# Patient Record
Sex: Male | Born: 1950
Health system: Southern US, Community
[De-identification: ages and names within clinical notes are randomized; demographics above are authoritative.]

## PROBLEM LIST (undated history)

## (undated) DIAGNOSIS — I4891 Unspecified atrial fibrillation: Secondary | ICD-10-CM

## (undated) DIAGNOSIS — J309 Allergic rhinitis, unspecified: Secondary | ICD-10-CM

## (undated) DIAGNOSIS — I509 Heart failure, unspecified: Secondary | ICD-10-CM

## (undated) DIAGNOSIS — I10 Essential (primary) hypertension: Secondary | ICD-10-CM

## (undated) DIAGNOSIS — I639 Cerebral infarction, unspecified: Secondary | ICD-10-CM

## (undated) DIAGNOSIS — M1990 Unspecified osteoarthritis, unspecified site: Secondary | ICD-10-CM

## (undated) DIAGNOSIS — I5022 Chronic systolic (congestive) heart failure: Secondary | ICD-10-CM

## (undated) DIAGNOSIS — T7840XA Allergy, unspecified, initial encounter: Secondary | ICD-10-CM

## (undated) DIAGNOSIS — Z720 Tobacco use: Secondary | ICD-10-CM

## (undated) DIAGNOSIS — J449 Chronic obstructive pulmonary disease, unspecified: Secondary | ICD-10-CM

## (undated) DIAGNOSIS — T63311A Toxic effect of venom of black widow spider, accidental (unintentional), initial encounter: Secondary | ICD-10-CM

## (undated) HISTORY — PX: APPENDECTOMY: SHX54

## (undated) HISTORY — DX: Allergy, unspecified, initial encounter: T78.40XA

## (undated) HISTORY — DX: Toxic effect of venom of black widow spider, accidental (unintentional), initial encounter: T63.311A

## (undated) HISTORY — DX: Allergic rhinitis, unspecified: J30.9

## (undated) HISTORY — DX: Cerebral infarction, unspecified: I63.9

## (undated) HISTORY — PX: TONSILLECTOMY: SUR1361

## (undated) HISTORY — PX: SMALL INTESTINE SURGERY: SHX150

## (undated) HISTORY — PX: VASECTOMY: SHX75

---

## 1999-07-18 ENCOUNTER — Encounter: Admission: RE | Admit: 1999-07-18 | Discharge: 1999-08-04 | Payer: Self-pay | Admitting: *Deleted

## 2010-09-12 ENCOUNTER — Other Ambulatory Visit: Payer: Self-pay | Admitting: *Deleted

## 2011-06-13 ENCOUNTER — Other Ambulatory Visit: Payer: Self-pay | Admitting: Family Medicine

## 2011-06-13 NOTE — Telephone Encounter (Signed)
.  [facility] PT REQUESTING A REFILL ON VIAGRA, WAS TOLD THE PHARMACY TOLD HIM TO CALL us  PLEASE CALL PT AT [phone]  [facility] ON [address]

## 2015-09-15 ENCOUNTER — Inpatient Hospital Stay (HOSPITAL_COMMUNITY)
Admission: EM | Admit: 2015-09-15 | Discharge: 2015-09-28 | DRG: 291 | Disposition: A | Discharge status: Home / Self Care | Payer: Self-pay | Attending: Internal Medicine | Admitting: Internal Medicine

## 2015-09-15 ENCOUNTER — Emergency Department (HOSPITAL_COMMUNITY): Payer: Self-pay

## 2015-09-15 ENCOUNTER — Ambulatory Visit (INDEPENDENT_AMBULATORY_CARE_PROVIDER_SITE_OTHER): Payer: Self-pay | Admitting: Emergency Medicine

## 2015-09-15 ENCOUNTER — Inpatient Hospital Stay (HOSPITAL_COMMUNITY): Payer: Self-pay

## 2015-09-15 ENCOUNTER — Encounter (HOSPITAL_COMMUNITY): Payer: Self-pay | Admitting: Emergency Medicine

## 2015-09-15 VITALS — BP 114/82 | HR 132 | Temp 98.6°F | Resp 20

## 2015-09-15 DIAGNOSIS — R05 Cough: Secondary | ICD-10-CM

## 2015-09-15 DIAGNOSIS — E876 Hypokalemia: Secondary | ICD-10-CM | POA: Diagnosis present

## 2015-09-15 DIAGNOSIS — R0600 Dyspnea, unspecified: Secondary | ICD-10-CM

## 2015-09-15 DIAGNOSIS — R7303 Prediabetes: Secondary | ICD-10-CM | POA: Diagnosis present

## 2015-09-15 DIAGNOSIS — K661 Hemoperitoneum: Secondary | ICD-10-CM | POA: Diagnosis not present

## 2015-09-15 DIAGNOSIS — I110 Hypertensive heart disease with heart failure: Principal | ICD-10-CM | POA: Diagnosis present

## 2015-09-15 DIAGNOSIS — N179 Acute kidney failure, unspecified: Secondary | ICD-10-CM | POA: Diagnosis not present

## 2015-09-15 DIAGNOSIS — I4891 Unspecified atrial fibrillation: Secondary | ICD-10-CM | POA: Diagnosis present

## 2015-09-15 DIAGNOSIS — I480 Paroxysmal atrial fibrillation: Secondary | ICD-10-CM

## 2015-09-15 DIAGNOSIS — Z720 Tobacco use: Secondary | ICD-10-CM | POA: Diagnosis present

## 2015-09-15 DIAGNOSIS — R0602 Shortness of breath: Secondary | ICD-10-CM

## 2015-09-15 DIAGNOSIS — D649 Anemia, unspecified: Secondary | ICD-10-CM | POA: Diagnosis present

## 2015-09-15 DIAGNOSIS — R109 Unspecified abdominal pain: Secondary | ICD-10-CM

## 2015-09-15 DIAGNOSIS — R578 Other shock: Secondary | ICD-10-CM | POA: Insufficient documentation

## 2015-09-15 DIAGNOSIS — F17200 Nicotine dependence, unspecified, uncomplicated: Secondary | ICD-10-CM | POA: Diagnosis present

## 2015-09-15 DIAGNOSIS — R0989 Other specified symptoms and signs involving the circulatory and respiratory systems: Secondary | ICD-10-CM

## 2015-09-15 DIAGNOSIS — I472 Ventricular tachycardia: Secondary | ICD-10-CM | POA: Diagnosis not present

## 2015-09-15 DIAGNOSIS — I959 Hypotension, unspecified: Secondary | ICD-10-CM

## 2015-09-15 DIAGNOSIS — R570 Cardiogenic shock: Secondary | ICD-10-CM | POA: Insufficient documentation

## 2015-09-15 DIAGNOSIS — I517 Cardiomegaly: Secondary | ICD-10-CM

## 2015-09-15 DIAGNOSIS — E873 Alkalosis: Secondary | ICD-10-CM | POA: Diagnosis present

## 2015-09-15 DIAGNOSIS — D62 Acute posthemorrhagic anemia: Secondary | ICD-10-CM | POA: Diagnosis not present

## 2015-09-15 DIAGNOSIS — Z888 Allergy status to other drugs, medicaments and biological substances status: Secondary | ICD-10-CM

## 2015-09-15 DIAGNOSIS — E222 Syndrome of inappropriate secretion of antidiuretic hormone: Secondary | ICD-10-CM | POA: Diagnosis present

## 2015-09-15 DIAGNOSIS — J40 Bronchitis, not specified as acute or chronic: Secondary | ICD-10-CM

## 2015-09-15 DIAGNOSIS — Z885 Allergy status to narcotic agent status: Secondary | ICD-10-CM

## 2015-09-15 DIAGNOSIS — T148XXA Other injury of unspecified body region, initial encounter: Secondary | ICD-10-CM

## 2015-09-15 DIAGNOSIS — M5430 Sciatica, unspecified side: Secondary | ICD-10-CM | POA: Diagnosis present

## 2015-09-15 DIAGNOSIS — F17210 Nicotine dependence, cigarettes, uncomplicated: Secondary | ICD-10-CM | POA: Diagnosis present

## 2015-09-15 DIAGNOSIS — R7401 Elevation of levels of liver transaminase levels: Secondary | ICD-10-CM | POA: Diagnosis present

## 2015-09-15 DIAGNOSIS — I5041 Acute combined systolic (congestive) and diastolic (congestive) heart failure: Secondary | ICD-10-CM | POA: Diagnosis present

## 2015-09-15 DIAGNOSIS — K7200 Acute and subacute hepatic failure without coma: Secondary | ICD-10-CM | POA: Diagnosis present

## 2015-09-15 DIAGNOSIS — R740 Nonspecific elevation of levels of transaminase and lactic acid dehydrogenase [LDH]: Secondary | ICD-10-CM

## 2015-09-15 DIAGNOSIS — J9601 Acute respiratory failure with hypoxia: Secondary | ICD-10-CM | POA: Diagnosis not present

## 2015-09-15 DIAGNOSIS — R571 Hypovolemic shock: Secondary | ICD-10-CM | POA: Diagnosis not present

## 2015-09-15 DIAGNOSIS — I071 Rheumatic tricuspid insufficiency: Secondary | ICD-10-CM | POA: Diagnosis present

## 2015-09-15 DIAGNOSIS — B3324 Viral cardiomyopathy: Secondary | ICD-10-CM | POA: Diagnosis present

## 2015-09-15 DIAGNOSIS — H9193 Unspecified hearing loss, bilateral: Secondary | ICD-10-CM | POA: Diagnosis present

## 2015-09-15 DIAGNOSIS — K761 Chronic passive congestion of liver: Secondary | ICD-10-CM | POA: Diagnosis present

## 2015-09-15 DIAGNOSIS — I252 Old myocardial infarction: Secondary | ICD-10-CM

## 2015-09-15 DIAGNOSIS — H547 Unspecified visual loss: Secondary | ICD-10-CM | POA: Diagnosis not present

## 2015-09-15 DIAGNOSIS — I5021 Acute systolic (congestive) heart failure: Secondary | ICD-10-CM

## 2015-09-15 DIAGNOSIS — E8809 Other disorders of plasma-protein metabolism, not elsewhere classified: Secondary | ICD-10-CM | POA: Diagnosis present

## 2015-09-15 DIAGNOSIS — R231 Pallor: Secondary | ICD-10-CM

## 2015-09-15 DIAGNOSIS — R58 Hemorrhage, not elsewhere classified: Secondary | ICD-10-CM | POA: Insufficient documentation

## 2015-09-15 DIAGNOSIS — Z8249 Family history of ischemic heart disease and other diseases of the circulatory system: Secondary | ICD-10-CM

## 2015-09-15 DIAGNOSIS — J441 Chronic obstructive pulmonary disease with (acute) exacerbation: Secondary | ICD-10-CM | POA: Diagnosis present

## 2015-09-15 DIAGNOSIS — R059 Cough, unspecified: Secondary | ICD-10-CM

## 2015-09-15 HISTORY — DX: Unspecified osteoarthritis, unspecified site: M19.90

## 2015-09-15 HISTORY — DX: Tobacco use: Z72.0

## 2015-09-15 LAB — I-STAT ARTERIAL BLOOD GAS, ED
Acid-base deficit: 1 mmol/L (ref 0.0–2.0)
Bicarbonate: 21.4 mEq/L (ref 20.0–24.0)
O2 SAT: 96 %
PCO2 ART: 30 mmHg — AB (ref 35.0–45.0)
PH ART: 7.463 — AB (ref 7.350–7.450)
PO2 ART: 79 mmHg — AB (ref 80.0–100.0)
Patient temperature: 98.6
TCO2: 22 mmol/L (ref 0–100)

## 2015-09-15 LAB — PROTIME-INR
INR: 1.41 (ref 0.00–1.49)
Prothrombin Time: 17.4 seconds — ABNORMAL HIGH (ref 11.6–15.2)

## 2015-09-15 LAB — CBC WITH DIFFERENTIAL/PLATELET
Basophils Absolute: 0 10*3/uL (ref 0.0–0.1)
Basophils Relative: 0 %
Eosinophils Absolute: 0 10*3/uL (ref 0.0–0.7)
Eosinophils Relative: 0 %
HEMATOCRIT: 40.6 % (ref 39.0–52.0)
HEMOGLOBIN: 13.5 g/dL (ref 13.0–17.0)
LYMPHS PCT: 13 %
Lymphs Abs: 1.5 10*3/uL (ref 0.7–4.0)
MCH: 29.3 pg (ref 26.0–34.0)
MCHC: 33.3 g/dL (ref 30.0–36.0)
MCV: 88.1 fL (ref 78.0–100.0)
MONO ABS: 0.9 10*3/uL (ref 0.1–1.0)
MONOS PCT: 8 %
NEUTROS ABS: 9 10*3/uL — AB (ref 1.7–7.7)
NEUTROS PCT: 79 %
Platelets: 236 10*3/uL (ref 150–400)
RBC: 4.61 MIL/uL (ref 4.22–5.81)
RDW: 13.8 % (ref 11.5–15.5)
WBC: 11.4 10*3/uL — ABNORMAL HIGH (ref 4.0–10.5)

## 2015-09-15 LAB — COMPREHENSIVE METABOLIC PANEL
ALBUMIN: 3.6 g/dL (ref 3.5–5.0)
ALT: 122 U/L — AB (ref 17–63)
AST: 81 U/L — AB (ref 15–41)
Alkaline Phosphatase: 81 U/L (ref 38–126)
Anion gap: 10 (ref 5–15)
BUN: 20 mg/dL (ref 6–20)
CHLORIDE: 97 mmol/L — AB (ref 101–111)
CO2: 21 mmol/L — AB (ref 22–32)
CREATININE: 0.96 mg/dL (ref 0.61–1.24)
Calcium: 8.8 mg/dL — ABNORMAL LOW (ref 8.9–10.3)
GFR calc Af Amer: >60 mL/min (ref >60)
GFR calc non Af Amer: >60 mL/min (ref >60)
GLUCOSE: 144 mg/dL — AB (ref 65–99)
Potassium: 4 mmol/L (ref 3.5–5.1)
SODIUM: 128 mmol/L — AB (ref 135–145)
Total Bilirubin: 1.3 mg/dL — ABNORMAL HIGH (ref 0.3–1.2)
Total Protein: 6 g/dL — ABNORMAL LOW (ref 6.5–8.1)

## 2015-09-15 LAB — RAPID URINE DRUG SCREEN, HOSP PERFORMED
AMPHETAMINES: NOT DETECTED
BARBITURATES: NOT DETECTED
BENZODIAZEPINES: POSITIVE — AB
COCAINE: NOT DETECTED
Opiates: NOT DETECTED
TETRAHYDROCANNABINOL: POSITIVE — AB

## 2015-09-15 LAB — MRSA PCR SCREENING: MRSA by PCR: NEGATIVE

## 2015-09-15 LAB — TROPONIN I
Troponin I: 0.07 ng/mL — ABNORMAL HIGH (ref <0.031)
Troponin I: 0.08 ng/mL — ABNORMAL HIGH (ref <0.031)
Troponin I: 0.07 ng/mL — ABNORMAL HIGH (ref <0.031)

## 2015-09-15 LAB — TSH: TSH: 1.387 u[IU]/mL (ref 0.350–4.500)

## 2015-09-15 LAB — BRAIN NATRIURETIC PEPTIDE: B Natriuretic Peptide: 919.3 pg/mL — ABNORMAL HIGH (ref 0.0–100.0)

## 2015-09-15 LAB — HEPARIN LEVEL (UNFRACTIONATED): Heparin Unfractionated: 0.13 IU/mL — ABNORMAL LOW (ref 0.30–0.70)

## 2015-09-15 LAB — MAGNESIUM: MAGNESIUM: 2.1 mg/dL (ref 1.7–2.4)

## 2015-09-15 MED ORDER — FUROSEMIDE 10 MG/ML IJ SOLN
20.0000 mg | Freq: Once | INTRAMUSCULAR | Status: AC
  Administered 2015-09-15: 20 mg via INTRAVENOUS
  Filled 2015-09-15: qty 2

## 2015-09-15 MED ORDER — DEXTROSE 5 % IV SOLN: 5.0000 mg/h | Freq: Once | INTRAVENOUS | Status: AC

## 2015-09-15 MED ORDER — DILTIAZEM HCL 100 MG IV SOLR
INTRAVENOUS | Status: AC
  Filled 2015-09-15: qty 100

## 2015-09-15 MED ORDER — METHYLPREDNISOLONE SODIUM SUCC 125 MG IJ SOLR
125.0000 mg | Freq: Once | INTRAMUSCULAR | Status: AC
  Administered 2015-09-15: 125 mg via INTRAVENOUS
  Filled 2015-09-15: qty 2

## 2015-09-15 MED ORDER — IPRATROPIUM-ALBUTEROL 0.5-2.5 (3) MG/3ML IN SOLN: 3.0000 mL | Freq: Four times a day (QID) | RESPIRATORY_TRACT | Status: DC | PRN

## 2015-09-15 MED ORDER — HEPARIN (PORCINE) IN NACL 100-0.45 UNIT/ML-% IJ SOLN
1800.0000 [IU]/h | INTRAMUSCULAR | Status: DC
  Administered 2015-09-15: 1200 [IU]/h via INTRAVENOUS
  Administered 2015-09-16: 1700 [IU]/h via INTRAVENOUS
  Administered 2015-09-18: 2000 [IU]/h via INTRAVENOUS
  Administered 2015-09-18: 2100 [IU]/h via INTRAVENOUS
  Administered 2015-09-20 (×2): 2000 [IU]/h via INTRAVENOUS
  Administered 2015-09-21: 1800 [IU]/h via INTRAVENOUS
  Filled 2015-09-15 (×10): qty 250

## 2015-09-15 MED ORDER — HEPARIN BOLUS VIA INFUSION
4000.0000 [IU] | Freq: Once | INTRAVENOUS | Status: AC
  Administered 2015-09-15: 4000 [IU] via INTRAVENOUS
  Filled 2015-09-15: qty 4000

## 2015-09-15 MED ORDER — DILTIAZEM HCL 100 MG IV SOLR
5.0000 mg/h | INTRAVENOUS | Status: DC
  Administered 2015-09-15: 12.5 mg/h via INTRAVENOUS
  Administered 2015-09-16 (×2): 5 mg/h via INTRAVENOUS
  Filled 2015-09-15 (×2): qty 100

## 2015-09-15 MED ORDER — ASPIRIN 81 MG PO CHEW
81.0000 mg | CHEWABLE_TABLET | Freq: Every day | ORAL | Status: DC
Start: 2015-09-16 — End: 2015-09-21
  Administered 2015-09-16 – 2015-09-21 (×6): 81 mg via ORAL
  Filled 2015-09-15 (×6): qty 1

## 2015-09-15 MED ORDER — IPRATROPIUM-ALBUTEROL 0.5-2.5 (3) MG/3ML IN SOLN
3.0000 mL | RESPIRATORY_TRACT | Status: DC
  Administered 2015-09-15 (×2): 3 mL via RESPIRATORY_TRACT
  Filled 2015-09-15 (×2): qty 3

## 2015-09-15 MED ORDER — BENZONATATE 100 MG PO CAPS
200.0000 mg | ORAL_CAPSULE | Freq: Three times a day (TID) | ORAL | Status: DC | PRN
Start: 2015-09-15 — End: 2015-09-19
  Administered 2015-09-15 – 2015-09-16 (×2): 200 mg via ORAL
  Filled 2015-09-15 (×4): qty 2

## 2015-09-15 MED ORDER — IPRATROPIUM-ALBUTEROL 0.5-2.5 (3) MG/3ML IN SOLN: 3.0000 mL | Freq: Three times a day (TID) | RESPIRATORY_TRACT | Status: DC

## 2015-09-15 MED ORDER — ACETAMINOPHEN 325 MG PO TABS: 650.0000 mg | ORAL_TABLET | Freq: Four times a day (QID) | ORAL | Status: DC | PRN

## 2015-09-15 MED ORDER — ASPIRIN 81 MG PO CHEW
324.0000 mg | CHEWABLE_TABLET | Freq: Once | ORAL | Status: AC
  Administered 2015-09-15: 324 mg via ORAL
  Filled 2015-09-15: qty 4

## 2015-09-15 MED ORDER — DEXTROSE 5 % IV SOLN
5.0000 mg/h | Freq: Once | INTRAVENOUS | Status: AC
  Administered 2015-09-15: 5 mg/h via INTRAVENOUS
  Filled 2015-09-15: qty 100

## 2015-09-15 MED ORDER — DILTIAZEM HCL 25 MG/5ML IV SOLN
20.0000 mg | Freq: Once | INTRAVENOUS | Status: AC
  Administered 2015-09-15: 20 mg via INTRAVENOUS
  Filled 2015-09-15: qty 5

## 2015-09-15 MED ORDER — OFF THE BEAT BOOK
Freq: Once | Status: AC
  Administered 2015-09-15: 22:00:00
  Filled 2015-09-15: qty 1

## 2015-09-15 MED ORDER — ACETAMINOPHEN 650 MG RE SUPP: 650.0000 mg | Freq: Four times a day (QID) | RECTAL | Status: DC | PRN

## 2015-09-15 MED ORDER — ALBUTEROL SULFATE (2.5 MG/3ML) 0.083% IN NEBU
5.0000 mg | INHALATION_SOLUTION | Freq: Once | RESPIRATORY_TRACT | Status: AC
  Administered 2015-09-15: 5 mg via RESPIRATORY_TRACT
  Filled 2015-09-15: qty 6

## 2015-09-15 NOTE — ED Notes (Signed)
Dr. Donnald Garre made aware of troponin 0.07.

## 2015-09-15 NOTE — Progress Notes (Signed)
Pt HR better controlled since admission. RN has assessed foot q2 H. Pt has bounding +2 pedal pulses. RN will continue to monitor

## 2015-09-15 NOTE — ED Notes (Signed)
Pt arrives from [facility] via EMS c/o SOB off and on for last 15 days.   Pt reports last night "struggling to get the air in and out".  Pt denies hx afib.  Resp labored, HR 130-150.

## 2015-09-15 NOTE — H&P (Signed)
Date: 09/15/2015               Patient Name:  Reginald Hahn MRN: [id number]  DOB: 1950-09-28 Age / Sex: 65 y.o., male   PCP: No primary care provider on file.              Medical Service: Internal Medicine Teaching Service              Attending Physician: Dr. Levert Feinstein, MD     First Contact: Garnette Czech, MS4 Pager: [phone]  Second Contact: Dr. Heywood Iles Pager: [phone]       After Hours (After 5p/  First Contact Pager: [phone]  weekends / holidays): Second Contact Pager: [phone]       Chief Complaint: Dyspnea, lightheadedness  History of Present Illness: Reginald Hahn is a 41yoM with a PMH significant for tobacco use (10 pack-years) and bilateral hearing impairment who presents with SOB of breath and lightheadedness. 1 month ago, he has a 3-day acute gastroenteritis. After recovering from that, he noted his "breath was not as strong." 2 wks ago, he developed intermittent episodes of shortness of breath that lasted 15-30 min. Had the sensation of having to "force air in." Since then these episodes have worsened and lengthened. This morning, he woke up at 1 AM feeling short of breath, lightheaded, and unsteady like he "was drunk." He went to urgent care today as the dyspnea was unbearable and he was worried. Found to be atrial fibrillation with RVR and was transported to [facility] via EMS. He denies chest pain, abdominal pain, nausea, vomiting, diarrhea, leg swelling, leg pain, leg trauma, calf tenderness or redness, weakness, tingling, and vision changes. Endorses feeling hot and cold recently but denies fevers. His legs felt sore when moving the last few days. Has never had this happen before.  No history of cardiac disease, lung disease, or clots. Started smoking 10 years ago during divorce. Has not seen a PCP in a long time. Prefers to get his medical care outside the [facility] system as he has had multiple family members pass away at [facility].  In the  ED: HR 140 upon arrival. He received ASA and diltiazem 20mg  IV followed by diltiazem gtt and heparin gtt. POC trop was 0.07. WBC 11.4 and Na 128. On CXR, he was found to have cardiomegaly and interstitial edema. At time of interview after these interventions, patient was comfortable and had improved SOB.   Meds: Current Facility-Administered Medications  Medication Dose Route Frequency Provider Last Rate Last Dose  . acetaminophen (TYLENOL) tablet 650 mg  650 mg Oral Q6H PRN Rushil Terrilee Croak, MD       Or  . acetaminophen (TYLENOL) suppository 650 mg  650 mg Rectal Q6H PRN Beather Arbour, MD      . heparin ADULT infusion 100 units/mL (25000 units/245mL sodium chloride 0.45%)  1,200 Units/hr Intravenous Continuous Sherron Monday, RPH 12 mL/hr at 09/15/15 1328 1,200 Units/hr at 09/15/15 1328  . ipratropium-albuterol (DUONEB) 0.5-2.5 (3) MG/3ML nebulizer solution 3 mL  3 mL Nebulization Q4H Arby Barrette, MD   3 mL at 09/15/15 1204    Allergies: Allergies as of 09/15/2015 - Review Complete 09/15/2015  Allergen Reaction Noted  . Codeine Itching 09/15/2015  . Other  09/15/2015   Past Medical History  Diagnosis Date  . Allergy     seasonal allergies  . Arthritis    Past Surgical History  Procedure Laterality Date  . Appendectomy  Mid 1960s [childhood]  . Vasectomy     Family History  Problem Relation Age of Onset  . Heart disease Mother     43s   Social History   Social History  . Marital Status: Married    Spouse Name: N/A  . Number of Children: N/A  . Years of Education: N/A   Occupational History  . Not on file.   Social History Main Topics  . Smoking status: Current Every Day Smoker -- 1.00 packs/day for 10 years    Types: Cigarettes  . Smokeless tobacco: Not on file  . Alcohol Use: No  . Drug Use: No     Comment: Used to smoke marijuana  . Sexual Activity: Not on file   Other Topics Concern  . Not on file   Social History Narrative   Lives alone   2  daughters, ex-wife live away   Owns a plumbing company   Deafness in the setting of loud machinery    Review of Systems: General: No HA, fevers Cardiovascular: Endorses palpitations recently. No chest pain. Respiratory: Endorses dyspnea on exertion and at rest. No cough prior to being in ED. Abd: Denies abd pain, n, v, d Extremities: Denies foot pain, paresthesias, or paralysis  Physical Exam: Blood pressure 112/100, pulse 62, temperature 97.8 F (36.6 C), temperature source Oral, resp. rate 29, height 6' (1.829 m), weight 87.68 kg (193 lb 4.8 oz), SpO2 94 %. BP 112/100 mmHg  Pulse 62  Temp(Src) 97.8 F (36.6 C) (Oral)  Resp 29  Ht 6' (1.829 m)  Wt 87.68 kg (193 lb 4.8 oz)  BMI 26.21 kg/m2  SpO2 94% General appearance: alert, cooperative, appears stated age and no distress Lungs: rales posterior - left lower field and wheezes diffusely (L>R) Heart: irregularly irregular rhythm; no murmur, rub, or gallop Abdomen: soft, non-tender, non-distended. Normoactive bowel sounds. Mid-LLQ scar from appendectomy Extremities: LLE: warm, good color, trace-to-1+ pitting edema, varicose veins, 1s cap refill. RLE - no edema. R foot cooler, pale compared to L, delayed cap refill, normal hair distribution. Pulses: L dorsalis pedis 2+. R dorsalis pedis and posterior tibialis non-palpable. R dorsalis pedis found with doppler. Neurologic: Able to move feet. Intact light touch and pain sensation.  Lab results:  Recent Labs  09/15/15 1109  WBC 11.4*  NEUTROABS 9.0*  HGB 13.5  HCT 40.6  MCV 88.1  PLT 236     Basic Metabolic Panel:  Recent Labs  11/91/47 1109  NA 128*  K 4.0  CL 97*  CO2 21*  GLUCOSE 144*  BUN 20  CREATININE 0.96  CALCIUM 8.8*   Mg 2.1  Liver Function Tests:  Recent Labs  09/15/15 1109  AST 81*  ALT 122*  ALKPHOS 81  BILITOT 1.3*  PROT 6.0*  ALBUMIN 3.6   CBC: Cardiac Enzymes:  Recent Labs  09/15/15 1109  TROPONINI 0.07*  0.08 @1600   BNP  919.3 TSH - 1.387 A1c - Pending Lipid panel - Pending  Coagulation:  Recent Labs  09/15/15 1109  LABPROT 17.4*  INR 1.41   Urine Drug Screen: Drugs of Abuse  Pending   Imaging results:  Dg Chest 2 View  09/15/2015  CLINICAL DATA:  Acute onset shortness of breath this morning. Atrial fibrillation with rapid ventricular response. EXAM: CHEST  2 VIEW COMPARISON:  09/15/2015 FINDINGS: Cardiomegaly remains stable. Bibasilar predominant interstitial prominence has decreased since previous study, consistent with decreased interstitial edema. No evidence of pulmonary consolidation. Tiny bilateral pleural effusions noted. IMPRESSION: Decreased  bibasilar interstitial prominence, consistent with decreased interstitial edema. Tiny bilateral pleural effusions. Stable cardiomegaly. Electronically Signed   By: Myles Rosenthal M.D.   On: 09/15/2015 15:31   Dg Chest Port 1 View  09/15/2015  CLINICAL DATA:  65 year old male with intermittent shortness of breath for 2 weeks. Labored respiration on presentation. Initial encounter. EXAM: PORTABLE CHEST 1 VIEW COMPARISON:  None. FINDINGS: Portable AP semi upright view at 1102 hours. Cardiomegaly. Other mediastinal contours are within normal limits. Visualized tracheal air column is within normal limits. Mildly increased pulmonary interstitial markings, more apparent at the lung bases. No superimposed pneumothorax, pleural effusion or consolidation. IMPRESSION: Cardiomegaly with mild basilar predominant increased interstitial markings. Differential considerations include chronic pulmonary changes, mild or developing interstitial edema, viral or atypical respiratory infection. Electronically Signed   By: Odessa Fleming M.D.   On: 09/15/2015 11:16    Other results: EKG: No historic EKGs. Irregularly irregular. Rate 141. Atrial fibrillation. QRS interval nml. QTc 441. Right axis deviation. No ST segment changes but moving baseline. Evidence of old  anterolateral infarct - Poor  R wave progression in V1-V4.  Assessment & Plan by Problem: Principal Problem:   Atrial fibrillation with rapid ventricular response (HCC) Active Problems:   Tobacco abuse  Reginald Hahn is a 58yoM with a history of tobacco use and 2 weeks of progressive dyspnea who was found to be in atrial fibrillation of RVR. Additionally, he was found to be tachypnic, have lung crackles and wheezing, have cardiomegaly and interstitial edema on CXR, elevated proBNP, mildly elevated troponin to 0.08, and transaminitis.  #Atrial fibrillation Possible causes include MI, lung disease, PE, infection, thyroid disease, alcohol, and drugs. Evidence of old MI on EKG. History less concerning for acute MI. At risk for lung disease 2/2 to 10-year of tobacco use. 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 PE less likely given no chest pain, LE swelling, or immobility. Risk factors include tobacco use. Infection possible with WBC to 11.4 prior to receiving steroids. No PNA on CXR. TSH nml. Denies heavy alcohol or drug use. Is not anemic. Intermittently symptomatic x2 weeks, so would have to anticoagulate adequately prior to attempting cardioversion. Cardioversion not emergently indicated as patient is hemodynamically stable. Trop to 0.07 likely 2/2 to demand/RVR. Elevated BNP likely 2/2 to afib w/ RVR. - Admit to tele - Diltiazem titration via gtt. Avoid metoprolol given possible lung disease. - Heparin gtt. Consider transition to NOAC. - Echo - Consult cardiology for recommendations once work-up is back if needed. - Trend trops q6h - UDS pending - Lipid panel and A1c pending.  #Dyspnea Most likely 2/2 to atrial fibrillation. Also treated for COPD exacerbation in ED with solumedrol and duonebs scheduled. ABGs notable for respiratory alkalosis likely 2/2 tachypnea. Pt felt duonebs were helping him expel mucous in lungs. 10-year history of 1 ppd may be too short to cause lung disease. Stopped scheduled duonebs given patient  is tachycardic and has afib. No hyperinflation on CXR. Interstitial edema without hyperinflation on CXR. BNP 919.3 on admission. S/p Lasix  IV. - Duonebs q6h prn - Consider redosing Lasix  if HDS and continues to have crackles and respiratory distress. - Wean O2 as tolerated  #R foot pallor, no palpable pulses (found on doppler), and less brisk refill Differential includes arterial thrombosis, PAD, and paroxysmal DVT. Arterial thrombosis in context of afib most concerning. PAD less likely given symptoms are unilateral. Unclear if patient has risk factors as he has no had regular medical care in long  time. No paresthesia, paralysis, pain, hair loss, or skin changes. No history of trauma or claudication. B/l leg soreness in past few days. Pt has not noticed before and is somewhat resistant to work-up (is worried about unnecessary care and prefers to get care elsewhere).  - ABIs -- If abnormal, consider arterial doppler for evaluation of arterial thrombosis. - Neurovascular checks by nursing - If worsens (e.g. becomes painful), check arterial dopplers and consider vascular consult  #Transaminitis ALT and AST 2x upper limit of normal. AP nml.  Bilirubin borderline elevated to 1.3. Differential includes congestive hepatopathy, NASH, infection, and Wilson's Disease. Most likely congestive hepatopathy given afib with RVR and evidence of CHF (cardiomegaly, elevated BNP, and interstitial edema). NASH possible given patient's age and unknown risk factors as he has not been to PCP in years. Infection possible, especially for HepA with recent GI illness 1 month ago. Wilson's Disease would be a late presentation for a male. Alcohol or acetaminophen toxicity unlikely given no heavy use of either. Follow-up in outpatient setting likely appropriate. - Repeat CMP when RVR and heart failure improved. - Consider hepatitis B and A panels - Consider RUQ ultrasound. - Consider iron studies  #  Hyponatremia Likely 2/2 fluid retention given heart failure 2/2 afib with RVR.  # Tobacco abuse 10 pack-year history. Declines nicotine replacement while inpatient.  #VTE Prophylaxis - Heparin gtt  #FEN/GI - No IVF as pt is HDS and has interstitial edema. - Hyponatremic - Recheck BMP in AM - Regular diet  #Dispo - Likely d/c to home - Needs PCP  This is a Psychologist, occupational Note.  The care of the patient was discussed with Dr. Heywood Iles and the assessment and plan was formulated with their assistance.  Please see their note for official documentation of the patient encounter.   Signed: Newton Pigg, Med Student 09/15/2015, 4:52 PM

## 2015-09-15 NOTE — H&P (Signed)
Date: 09/15/2015               Patient Name:  Reginald Hahn MRN: [id number]  DOB: 1950-09-15 Age / Sex: 65 y.o., male   PCP: No primary care provider on file.         Medical Service: Internal Medicine Teaching Service         Attending Physician: Dr. Levert Feinstein, MD    First Contact: Garnette Czech, MS4 Pager: [phone]  Second Contact: Dr. Heywood Iles Pager: [phone]       After Hours (After 5p/  First Contact Pager: [phone]  weekends / holidays): Second Contact Pager: [phone]   Chief Complaint: dyspnea, dizziness  History of Present Illness: Reginald Hahn is a 65 year old male with 10-pack year tobacco use who presented with two-week history of worsening dyspnea. About one month ago, he suffered a bout of viral gastroenteritis which consisted of diarrhea, nausea, vomiting. These symptoms lasted for 3 days and then he improved over the next 2 weeks. However over the last 2 weeks he has noted the progressive onset of worsening dyspnea to the point where he felt "forced inspiration." His symptoms are also associated with episodes of palpitations, dizziness to the point of feeling disorientated which was most pronounced yesterday when his vision became blurry. This morning he awoke and felt his symptoms were getting worse, so he went to urgent care who then referred him to the emergency department. He otherwise denies any prior history of cardiac or pulmonary problems, chest pain, headache, numbness, tingling, abdominal pain, leg swelling, leg pain, calf tenderness though he does feel his legs have been sore with movement over the last several days. He owns a Teaching laboratory technician. He does not regularly follow with a doctor and only takes ibuprofen as needed for headaches.   In the ED, he is found to be in A. fib with heart rate in the 140s. He received ASA 325mg , Lasix 20mg  IV, Solumedrol 125mg  IV and was started on diltiazem IV.   Meds: Current Facility-Administered Medications    Medication Dose Route Frequency Provider Last Rate Last Dose  . acetaminophen (TYLENOL) tablet 650 mg  650 mg Oral Q6H PRN Daily Crate Terrilee Croak, MD       Or  . acetaminophen (TYLENOL) suppository 650 mg  650 mg Rectal Q6H PRN Beather Arbour, MD      . heparin ADULT infusion 100 units/mL (25000 units/258mL sodium chloride 0.45%)  1,200 Units/hr Intravenous Continuous Sherron Monday, RPH 12 mL/hr at 09/15/15 1328 1,200 Units/hr at 09/15/15 1328  . ipratropium-albuterol (DUONEB) 0.5-2.5 (3) MG/3ML nebulizer solution 3 mL  3 mL Nebulization TID Levert Feinstein, MD        Allergies: Allergies as of 09/15/2015 - Review Complete 09/15/2015  Allergen Reaction Noted  . Codeine Itching 09/15/2015  . Other  09/15/2015   Past Medical History  Diagnosis Date  . Allergy     seasonal allergies  . Arthritis   . Tobacco use     x10 years. 1 ppd   Past Surgical History  Procedure Laterality Date  . Appendectomy      Mid 1960s [childhood]  . Vasectomy     Family History  Problem Relation Age of Onset  . Heart disease Mother     58s   Social History   Social History  . Marital Status: Married    Spouse Name: N/A  . Number of Children: N/A  . Years of Education: N/A  Occupational History  . Not on file.   Social History Main Topics  . Smoking status: Current Every Day Smoker -- 1.00 packs/day for 10 years    Types: Cigarettes  . Smokeless tobacco: Not on file  . Alcohol Use: No  . Drug Use: No     Comment: Used to smoke marijuana  . Sexual Activity: Not on file   Other Topics Concern  . 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  Physical Exam: Blood pressure 112/87, pulse 63, temperature 97.8 F (36.6 C), temperature source Oral, resp. rate 18,  height 6' (1.829 m), weight 193 lb 4.8 oz (87.68 kg), SpO2 92 %.  General: resting in bed, NAD HEENT: PERRL, EOMI, no scleral icterus, oropharynx clear Cardiac: Irregular rate and rhythm, no rubs, murmurs or gallops Pulm: crackles [L > R] Abd: soft, nontender, nondistended, BS present Ext: Left lower extremity without pallor, varicose veins, 2+ dorsalis pedis pulses. Right lower extremity with foot that is cool to touch and slightly pale as compared to left with mild delay in capillary refill though no signs of atrophy or hairless skin. Unable to palpate dorsalis pedis or posterior tibialis pulses though present on dorsalis pedis present on Doppler. Neuro: responds to questions appropriately; moving all extremities freely, 5 out of 5 lower extremity strength bilaterally   Lab results: Basic Metabolic Panel:  Recent Labs  16/10/96 1109 09/15/15 1558  NA 128*  --   K 4.0  --   CL 97*  --   CO2 21*  --   GLUCOSE 144*  --   BUN 20  --   CREATININE 0.96  --   CALCIUM 8.8*  --   MG  --  2.1   Liver Function Tests:  Recent Labs  09/15/15 1109  AST 81*  ALT 122*  ALKPHOS 81  BILITOT 1.3*  PROT 6.0*  ALBUMIN 3.6   CBC:  Recent Labs  09/15/15 1109  WBC 11.4*  NEUTROABS 9.0*  HGB 13.5  HCT 40.6  MCV 88.1  PLT 236   Cardiac Enzymes:  Recent Labs  09/15/15 1109 09/15/15 1558  TROPONINI 0.07* 0.08*   Thyroid Function Tests:  Recent Labs  09/15/15 1558  TSH 1.387   Coagulation:  Recent Labs  09/15/15 1109  LABPROT 17.4*  INR 1.41    Imaging results:  Dg Chest 2 View  09/15/2015  CLINICAL DATA:  Acute onset shortness of breath this morning. Atrial fibrillation with rapid ventricular response. EXAM: CHEST  2 VIEW COMPARISON:  09/15/2015 FINDINGS: Cardiomegaly remains stable. Bibasilar predominant interstitial prominence has decreased since previous study, consistent with decreased interstitial edema. No evidence of pulmonary consolidation. Tiny bilateral  pleural effusions noted. IMPRESSION: Decreased bibasilar interstitial prominence, consistent with decreased interstitial edema. Tiny bilateral pleural effusions. Stable cardiomegaly. Electronically Signed   By: Myles Rosenthal M.D.   On: 09/15/2015 15:31   Dg Chest Port 1 View  09/15/2015  CLINICAL DATA:  65 year old male with intermittent shortness of breath for 2 weeks. Labored respiration on presentation. Initial encounter. EXAM: PORTABLE CHEST 1 VIEW COMPARISON:  None. FINDINGS: Portable AP semi upright view at 1102 hours. Cardiomegaly. Other mediastinal contours are within normal limits. Visualized tracheal air column is within normal limits. Mildly  increased pulmonary interstitial markings, more apparent at the lung bases. No superimposed pneumothorax, pleural effusion or consolidation. IMPRESSION: Cardiomegaly with mild basilar predominant increased interstitial markings. Differential considerations include chronic pulmonary changes, mild or developing interstitial edema, viral or atypical respiratory infection. Electronically Signed   By: Odessa Fleming M.D.   On: 09/15/2015 11:16    Other results: EKG: Reviewed and compared with 09/15/15 Irregular irregularly rhythm  .  Assessment & Plan by Problem: Principal Problem:   Atrial fibrillation with rapid ventricular response (HCC) Active Problems:   Tobacco abuse  Mr. Eustace is 65 year old male with 10-pack-year history of tobacco abuse who presents with two-week history of progressive dyspnea associated with dizziness, palpitations found to have atrial fibrillation with RVR, abnormal LFTs, asymmetric lower extremity pallor.  Atrial fibrillation with RVR: Trigger is unclear at this point. Symptoms suggest new onset congestive heart failure which would be consistent with elevated BNP 919.3 and interstitial edema noted on initial chest x-ray. Possibly related to viral illness 4 weeks ago and subsequent myocarditis given cardiomegaly on chest x-ray though  unclear if time course would be consistent. Smoking history would be consistent with a chronic pulmonary process. No prior cardiac history or family history. -Admit to telemetry -Continue diltiazem infusion with goal heart rate less than 110 -Continue heparin infusion for anticoagulation. CHADSVASC score 0 though unknown prior history of hypertension, diabetes, peripheral vascular disease. -Follow-up echo, UDS, TSH, lipid panel, A1c -Discontinue DuoNeb nebs as albuterol is likely to worsen heart rate  Asymmetric lower extremity pallor: Given subacute onset of atrial fibrillation, he may have embolic disease. Neurologic examination intact which is reassuring. No paresthesia, paralysis, pain, atrophy which are reassuring.  -Continue neurovascular checks every 4 hours -Check ABI with follow-up arterial Dopplers of abnormal  Transaminitis: ALT greater than AST. Bilirubin elevated at 1.3. -Repeat CMET tomorrow -Check hepatitis C  Dispo: Disposition is deferred at this time, awaiting improvement of current medical problems.   The patient does not have a current PCP (No primary care provider on file.) and does need an [facility] hospital follow-up appointment after discharge.  The patient does have transportation limitations that hinder transportation to clinic appointments.  Signed: Beather Arbour, MD 09/15/2015, 7:01 PM

## 2015-09-15 NOTE — Progress Notes (Signed)
By signing my name below, I, Stann Ore, attest that this documentation has been prepared under the direction and in the presence of Lesle Chris, MD. Electronically Signed: Stann Ore, Scribe. 09/15/2015 , 9:18 AM .  Patient was seen in room 7 .  Chief Complaint:  Chief Complaint  Patient presents with  . Shortness of Breath    for the past week, believes its due to allergies.     HPI: Reginald Hahn is a 65 y.o. male who reports to [facility] today complaining of shortness of breath with lightheadedness that started about 2 weeks ago. Patient notes he had stomach virus with vomiting about a month ago. He treated the symptoms but started to notice shortness of breath 2 weeks ago. His wife advised him to be evaluated. He states difficulty breathing and pacing a lot last night. If he coughs really hard, he notes head pressure. He's a current smoker, at 1 pack a day. He's tried an inhaler in the past with mild relief, but doesn't recall which one it was. He denies taking any medication for this issue.   No past medical history on file. No past surgical history on file. Social History   Social History  . Marital Status: Married    Spouse Name: N/A  . Number of Children: N/A  . Years of Education: N/A   Social History Main Topics  . Smoking status: Current Every Day Smoker  . Smokeless tobacco: None  . Alcohol Use: No  . Drug Use: No  . Sexual Activity: Not Asked   Other Topics Concern  . None   Social History Narrative  . None   No family history on file. Allergies  Allergen Reactions  . Other     Seasonal allergies   Prior to Admission medications   Not on File     ROS:  Constitutional: negative for fever, chills, night sweats, weight changes, or fatigue  HEENT: negative for vision changes, hearing loss, congestion, rhinorrhea, ST, epistaxis, or sinus pressure Cardiovascular: negative for chest pain or palpitations Respiratory: negative for hemoptysis,  wheezing; positive for shortness of breath, cough Abdominal: negative for abdominal pain, nausea, vomiting, diarrhea, or constipation Dermatological: negative for rash Neurologic: negative for headache, dizziness, or syncope; positive for lightheadedness All other systems reviewed and are otherwise negative with the exception to those above and in the HPI.  PHYSICAL EXAM: Filed Vitals:   09/15/15 0909  BP: 114/82  Pulse: 132  Temp: 98.6 F (37 C)  Resp: 20   There is no height or weight on file to calculate BMI.   General: Alert, moderate respiratory distress HEENT:  Normocephalic, atraumatic, oropharynx patent. Eye: Nonie Hoyer [facility] Cardiovascular:  Tachycardic and irregular rhythm, no rubs murmurs or gallops.  No Carotid bruits, radial pulse intact. No pedal edema.  Respiratory: Diminished breath sounds in the bases,  No cyanosis, no use of accessory musculature Abdominal: Scar to the left of the belly button, appears distended Musculoskeletal: Gait intact. No edema, tenderness Skin: No rashes. Neurologic: Facial musculature symmetric. Psychiatric: Patient acts appropriately throughout our interaction.  Lymphatic: No cervical or submandibular lymphadenopathy Genitourinary/Anorectal: No acute findings  LABS:   EKG/XRAY:   EKG: afib with rapid ventricular response  ASSESSMENT/PLAN:  Patient presents with progressive shortness of breath following a illness about one month ago with vomiting and abdominal pain. In the office he is tachypneic in atrial fibrillation with rapid ventricular response. He has diminished breath sounds in the bases as well as some abdominal  distention without lower extremity edema. EMS called is placed on a monitor and O2 transported to the hospital for further evaluation.I personally performed the services described in this documentation, which was scribed in my presence. The recorded information has been reviewed and is accurate. Patient was transported  EMS prior to having blood drawn.  Gross sideeffects, risk and benefits, and alternatives of medications d/w patient. Patient is aware that all medications have potential sideeffects and we are unable to predict every sideeffect or drug-drug interaction that may occur.  Lesle Chris MD 09/15/2015 9:11 AM

## 2015-09-15 NOTE — Progress Notes (Signed)
PHARMACY NOTE  Consult :  Heparin Indication :  AFib  Heparin Dosing Wt :  86 kg  LABS :  Recent Labs  09/15/15 1109 09/15/15 1831  HGB 13.5  --   HCT 40.6  --   PLT 236  --   LABPROT 17.4*  --   INR 1.41  --   HEPARINUNFRC  --  0.13*  CREATININE 0.96  --     MEDICATION: Infusion[s]: Infusions:  . heparin 1,200 Units/hr (09/15/15 1328)   ASSESSMENT :  65 y.o. male is currently on Heparin for AFib.   Heparin currently infusing at 1200 units/hr. Heparin level Sub-Therapeutic at 13 units/ml.  No evidence of bleeding complications observed.  GOAL : Heparin Level  0.3 - 0.7 units/ml  PLAN : 1. Increase Heparin infusion to1450 units/hr.  2. The next Heparin Level with AM Labs.  Velda Shell,  Pharm.D   09/15/2015,  7:57 PM

## 2015-09-15 NOTE — ED Provider Notes (Signed)
CSN: [id number]     Arrival date & time 09/15/15  1007 History   First MD Initiated Contact with Patient 09/15/15 1030     Chief Complaint  Patient presents with  . Shortness of Breath     (Consider location/radiation/quality/duration/timing/severity/associated sxs/prior Treatment) HPI Patient denies any known medical history. He is a one pack per day smoker. He reports about a month ago he had a GI illness from which he recovered. He reports for the past 2 weeks now he has had some dry cough. He denies he develop fever or had any mucus production. He does report he started to become increasing short of breath and today he was extremely short of breath. He reports as been very difficult to get the air in and out. He denies any chest pain. He has not had vomiting or diarrhea for almost a month. He never developed fever. Patient denies lower extremity calf pain or swelling. No history of PE or DVT. Patient does not seek regular medical care as he has generally felt well. He was seen at urgent care and referred to the emergency department for H of fibrillation with rapid ventricular response. Past Medical History  Diagnosis Date  . Allergy     seasonal allergies  . Arthritis    Past Surgical History  Procedure Laterality Date  . Appendectomy     No family history on file. Social History  Substance Use Topics  . Smoking status: Current Every Day Smoker -- 1.00 packs/day for 10 years    Types: Cigarettes  . Smokeless tobacco: None  . Alcohol Use: No    Review of Systems  10 Systems reviewed and are negative for acute change except as noted in the HPI.   Allergies  Codeine and Other  Home Medications   Prior to Admission medications   Medication Sig Start Date End Date Taking? Authorizing Provider  ibuprofen (ADVIL) 200 MG tablet Take 200 mg by mouth every 6 (six) hours as needed for fever.   Yes Historical Provider, MD   BP 124/106 mmHg  Pulse 117  Temp(Src) 98.4 F (36.9 C)  (Oral)  Resp 27  Ht 6' (1.829 m)  Wt 180 lb (81.647 kg)  BMI 24.41 kg/m2  SpO2 96% Physical Exam  Constitutional:  Patient's thin. He is alert and nontoxic. Mild increased work of breathing. Mental status is clear.  HENT:  Head: Normocephalic and atraumatic.  Eyes: EOM are normal.  Cardiovascular:  Tachycardia irregularly irregular. Distant heart sounds cannot appreciate rub murmur or gallop.  Pulmonary/Chest:  Mild to moderate increased work of breathing at rest. Patient is speaking in full sentences. Patient has coarse expiratory wheeze at the bases and mid lung fields. Re-auscultation post nebulizer treatment had increased aeration to the bases with more fine wheeze now appreciable in the upper lung fields and ongoing wheeze in lower fields.  Abdominal: Soft. He exhibits no distension. There is no tenderness.  Musculoskeletal: He exhibits no edema or tenderness.  Neurological: He is alert. He exhibits normal muscle tone. Coordination normal.  Skin: Skin is warm and dry.  Psychiatric: He has a normal mood and affect.    ED Course  Procedures (including critical care time) CRITICAL CARE Performed by: Arby Barrette   Total critical care time: 45 minutes  Critical care time was exclusive of separately billable procedures and treating other patients.  Critical care was necessary to treat or prevent imminent or life-threatening deterioration.  Critical care was time spent personally by me on  the following activities: development of treatment plan with patient and/or surrogate as well as nursing, discussions with consultants, evaluation of patient's response to treatment, examination of patient, obtaining history from patient or surrogate, ordering and performing treatments and interventions, ordering and review of laboratory studies, ordering and review of radiographic studies, pulse oximetry and re-evaluation of patient's condition. Labs Review Labs Reviewed  COMPREHENSIVE  METABOLIC PANEL - Abnormal; Notable for the following:    Sodium 128 (*)    Chloride 97 (*)    CO2 21 (*)    Glucose, Bld 144 (*)    Calcium 8.8 (*)    Total Protein 6.0 (*)    AST 81 (*)    ALT 122 (*)    Total Bilirubin 1.3 (*)    All other components within normal limits  BRAIN NATRIURETIC PEPTIDE - Abnormal; Notable for the following:    B Natriuretic Peptide 919.3 (*)    All other components within normal limits  TROPONIN I - Abnormal; Notable for the following:    Troponin I 0.07 (*)    All other components within normal limits  CBC WITH DIFFERENTIAL/PLATELET - Abnormal; Notable for the following:    WBC 11.4 (*)    Neutro Abs 9.0 (*)    All other components within normal limits  PROTIME-INR - Abnormal; Notable for the following:    Prothrombin Time 17.4 (*)    All other components within normal limits  I-STAT ARTERIAL BLOOD GAS, ED - Abnormal; Notable for the following:    pH, Arterial 7.463 (*)    pCO2 arterial 30.0 (*)    pO2, Arterial 79.0 (*)    All other components within normal limits  BLOOD GAS, ARTERIAL  HEPARIN LEVEL (UNFRACTIONATED)    Imaging Review Dg Chest Port 1 View  09/15/2015  CLINICAL DATA:  65 year old male with intermittent shortness of breath for 2 weeks. Labored respiration on presentation. Initial encounter. EXAM: PORTABLE CHEST 1 VIEW COMPARISON:  None. FINDINGS: Portable AP semi upright view at 1102 hours. Cardiomegaly. Other mediastinal contours are within normal limits. Visualized tracheal air column is within normal limits. Mildly increased pulmonary interstitial markings, more apparent at the lung bases. No superimposed pneumothorax, pleural effusion or consolidation. IMPRESSION: Cardiomegaly with mild basilar predominant increased interstitial markings. Differential considerations include chronic pulmonary changes, mild or developing interstitial edema, viral or atypical respiratory infection. Electronically Signed   By: Odessa Fleming M.D.   On:  09/15/2015 11:16   I have personally reviewed and evaluated these images and lab results as part of my medical decision-making.   EKG Interpretation   Date/Time:  Wednesday September 15 2015 10:09:48 EDT Ventricular Rate:  141 PR Interval:    QRS Duration: 81 QT Interval:  288 QTC Calculation: 441 R Axis:   95 Text Interpretation:  Atrial fibrillation Right axis deviation  Anteroseptal infarct, old Nonspecific repol abnormality, diffuse leads  agree. no STEMI.NO OLD COMP Confirmed by Donnald Garre, MD, Lebron Conners [phone]) on  09/15/2015 10:31:16 AM     Recheck 12:27. Patient reports feeling significantly improved after DuoNeb. Repeat auscultation shows improved air flow to the bases but continued extensive wheezing. The patient has been given a 20 mg bolus of Cardizem. At this time heart rate continues to be approximately 110-120. Patient denies any chest pain. Mental status is clear and alert. The troponin has returned slightly elevated. He patient will be initiated on heparin for atrophic fibrillation and elevated troponin. MDM   Final diagnoses:  Atrial fibrillation with rapid ventricular response (  HCC)  Bronchitis  Cardiomegaly   Patient is a combination of bronchitis type symptoms incrementally developing for about 2 weeks. He is a smoker and has extensive wheezing. He however denies any known history of COPD and has not routinely used inhalers. The patient is experiencing subjective improvement with DuoNeb as well as increased aeration to the lower lung fields. He does however have atrial fibrillation with rapid ventricular response which is new for him. He was not aware of this diagnosis. He has not been expressing chest pain or palpitations. His predominant symptom has been incrementally increasing dyspnea. Chest x-ray does show cardiomegaly revealing that the patient has had incrementally developing cardiac disease, at this time etiology unclear. Patient does not appear to be a chronic  hypertensive. He does not describe fever or CP that would suggest acute infectious or viral cardiomyopathy. At this time he will require admission for further diagnostic evaluation and echocardiogram. Patient has been given a bolus of Cardizem with initiation of Cardizem drip for rate control. Heparin is initiated for atrial fibrillation with unknown time of onset.  Bronchitis has been treated with Solu-Medrol and DuoNeb's.    Arby Barrette, MD 09/15/15 1329

## 2015-09-15 NOTE — Progress Notes (Signed)
ANTICOAGULATION CONSULT NOTE - Initial Consult  Pharmacy Consult for heparin Indication: atrial fibrillation  Allergies  Allergen Reactions  . Codeine Itching    whelps  . Other     Seasonal allergies, "all spices, salt, pepper"    Patient Measurements: Height: 6' (182.9 cm) Weight: 180 lb (81.647 kg) IBW/kg (Calculated) : 77.6 Heparin Dosing Weight: 86.1  Vital Signs: Temp: 98.4 F (36.9 C) (06/07 1014) Temp Source: Oral (06/07 1014) BP: 124/106 mmHg (06/07 1300) Pulse Rate: 117 (06/07 1300)  Labs:  Recent Labs  09/15/15 1109  HGB 13.5  HCT 40.6  PLT 236  LABPROT 17.4*  INR 1.41  CREATININE 0.96  TROPONINI 0.07*    Estimated Creatinine Clearance: 85.3 mL/min (by C-G formula based on Cr of 0.96).   Medical History: Past Medical History  Diagnosis Date  . Allergy     seasonal allergies  . Arthritis     Medications:  Scheduled:  . heparin  4,000 Units Intravenous Once  . ipratropium-albuterol  3 mL Nebulization Q4H    Assessment: 65 yo male with SOB and lightheadedness found to be in AFib. Pharmacy consulted to dose heparin. No anticoagulation PTA. CBC wnl.  Goal of Therapy:  Heparin level 0.3-0.7 units/ml Monitor platelets by anticoagulation protocol: Yes   Plan:  Give 4000 units bolus x 1 Start heparin infusion at 1200 units/hr Check anti-Xa level in 6 hours and daily while on heparin Continue to monitor H&H and platelets  Sherron Monday, PharmD Clinical Pharmacy Resident Pager: [phone] 09/15/2015 1:13 PM

## 2015-09-15 NOTE — ED Notes (Signed)
Admitting MD at bedside.

## 2015-09-16 ENCOUNTER — Encounter (HOSPITAL_COMMUNITY): Payer: Self-pay | Admitting: Radiology

## 2015-09-16 ENCOUNTER — Inpatient Hospital Stay (HOSPITAL_COMMUNITY): Payer: Self-pay

## 2015-09-16 DIAGNOSIS — Z720 Tobacco use: Secondary | ICD-10-CM

## 2015-09-16 DIAGNOSIS — R740 Nonspecific elevation of levels of transaminase and lactic acid dehydrogenase [LDH]: Secondary | ICD-10-CM

## 2015-09-16 DIAGNOSIS — I4891 Unspecified atrial fibrillation: Secondary | ICD-10-CM

## 2015-09-16 DIAGNOSIS — I5041 Acute combined systolic (congestive) and diastolic (congestive) heart failure: Secondary | ICD-10-CM | POA: Diagnosis present

## 2015-09-16 DIAGNOSIS — I5022 Chronic systolic (congestive) heart failure: Secondary | ICD-10-CM

## 2015-09-16 DIAGNOSIS — E871 Hypo-osmolality and hyponatremia: Secondary | ICD-10-CM

## 2015-09-16 DIAGNOSIS — R0600 Dyspnea, unspecified: Secondary | ICD-10-CM

## 2015-09-16 DIAGNOSIS — N179 Acute kidney failure, unspecified: Secondary | ICD-10-CM

## 2015-09-16 DIAGNOSIS — R0989 Other specified symptoms and signs involving the circulatory and respiratory systems: Secondary | ICD-10-CM

## 2015-09-16 HISTORY — DX: Chronic systolic (congestive) heart failure: I50.22

## 2015-09-16 LAB — ECHOCARDIOGRAM COMPLETE
EERAT: 9.49
EWDT: 137 ms
FS: 7 % — AB (ref 28–44)
Height: 72 in
IV/PV OW: 0.8
LA ID, A-P, ES: 41 mm
LA diam end sys: 41 mm
LA vol A4C: 49.3 ml
LA vol: 79 mL
LADIAMINDEX: 1.94 cm/m2
LAVOLIN: 37.4 mL/m2
LV E/e' medial: 9.49
LV SIMPSON'S DISK: 18
LV TDI E'LATERAL: 8.38
LV dias vol: 154 mL — AB (ref 62–150)
LV e' LATERAL: 8.38 cm/s
LVDIAVOLIN: 73 mL/m2
LVEEAVG: 9.49
LVOT area: 3.8 cm2
LVOTD: 22 mm
LVSYSVOL: 127 mL — AB (ref 21–61)
LVSYSVOLIN: 60 mL/m2
MV Dec: 137
MV Peak grad: 3 mmHg
MV pk E vel: 79.5 m/s
PW: 15 mm — AB (ref 0.6–1.1)
Reg peak vel: 216 cm/s
Stroke v: 27 ml
TDI e' medial: 5.82
TR max vel: 216 cm/s
Weight: 3065.6 oz

## 2015-09-16 LAB — COMPREHENSIVE METABOLIC PANEL
ALBUMIN: 3.5 g/dL (ref 3.5–5.0)
ALT: 181 U/L — ABNORMAL HIGH (ref 17–63)
ANION GAP: 13 (ref 5–15)
AST: 118 U/L — AB (ref 15–41)
Alkaline Phosphatase: 79 U/L (ref 38–126)
BILIRUBIN TOTAL: 1.3 mg/dL — AB (ref 0.3–1.2)
BUN: 30 mg/dL — AB (ref 6–20)
CHLORIDE: 95 mmol/L — AB (ref 101–111)
CO2: 18 mmol/L — ABNORMAL LOW (ref 22–32)
Calcium: 8.7 mg/dL — ABNORMAL LOW (ref 8.9–10.3)
Creatinine, Ser: 1.37 mg/dL — ABNORMAL HIGH (ref 0.61–1.24)
GFR calc Af Amer: >60 mL/min (ref >60)
GFR, EST NON AFRICAN AMERICAN: 53 mL/min — AB (ref >60)
GLUCOSE: 181 mg/dL — AB (ref 65–99)
POTASSIUM: 3.7 mmol/L (ref 3.5–5.1)
Sodium: 126 mmol/L — ABNORMAL LOW (ref 135–145)
TOTAL PROTEIN: 5.9 g/dL — AB (ref 6.5–8.1)

## 2015-09-16 LAB — CBC
HEMATOCRIT: 38.7 % — AB (ref 39.0–52.0)
Hemoglobin: 12.7 g/dL — ABNORMAL LOW (ref 13.0–17.0)
MCH: 28.7 pg (ref 26.0–34.0)
MCHC: 32.8 g/dL (ref 30.0–36.0)
MCV: 87.6 fL (ref 78.0–100.0)
PLATELETS: 243 10*3/uL (ref 150–400)
RBC: 4.42 MIL/uL (ref 4.22–5.81)
RDW: 13.8 % (ref 11.5–15.5)
WBC: 8.7 10*3/uL (ref 4.0–10.5)

## 2015-09-16 LAB — OSMOLALITY: OSMOLALITY: 284 mosm/kg (ref 275–295)

## 2015-09-16 LAB — LIPID PANEL
CHOL/HDL RATIO: 5.5 ratio
CHOLESTEROL: 131 mg/dL (ref 0–200)
HDL: 24 mg/dL — AB (ref >40)
LDL Cholesterol: 93 mg/dL (ref 0–99)
Triglycerides: 71 mg/dL (ref <150)
VLDL: 14 mg/dL (ref 0–40)

## 2015-09-16 LAB — OSMOLALITY, URINE: Osmolality, Ur: 625 mOsm/kg (ref 300–900)

## 2015-09-16 LAB — HEPARIN LEVEL (UNFRACTIONATED)
HEPARIN UNFRACTIONATED: 0.33 [IU]/mL (ref 0.30–0.70)
Heparin Unfractionated: 0.12 IU/mL — ABNORMAL LOW (ref 0.30–0.70)
Heparin Unfractionated: 0.54 IU/mL (ref 0.30–0.70)

## 2015-09-16 LAB — HEMOGLOBIN A1C
Hgb A1c MFr Bld: 5.9 % — ABNORMAL HIGH (ref 4.8–5.6)
Mean Plasma Glucose: 123 mg/dL

## 2015-09-16 LAB — TROPONIN I: TROPONIN I: 0.07 ng/mL — AB (ref <0.031)

## 2015-09-16 LAB — SODIUM, URINE, RANDOM: Sodium, Ur: <10 mmol/L

## 2015-09-16 MED ORDER — IOPAMIDOL (ISOVUE-370) INJECTION 76%
INTRAVENOUS | Status: AC
  Administered 2015-09-16: 90 mL
  Filled 2015-09-16: qty 100

## 2015-09-16 MED ORDER — NICOTINE 21 MG/24HR TD PT24
21.0000 mg | MEDICATED_PATCH | Freq: Every day | TRANSDERMAL | Status: DC
  Administered 2015-09-16 – 2015-09-28 (×13): 21 mg via TRANSDERMAL
  Filled 2015-09-16 (×13): qty 1

## 2015-09-16 MED ORDER — IPRATROPIUM BROMIDE 0.02 % IN SOLN
0.5000 mg | Freq: Four times a day (QID) | RESPIRATORY_TRACT | Status: DC
  Administered 2015-09-16 – 2015-09-17 (×4): 0.5 mg via RESPIRATORY_TRACT
  Filled 2015-09-16 (×7): qty 2.5

## 2015-09-16 MED ORDER — METOPROLOL TARTRATE 12.5 MG HALF TABLET
12.5000 mg | ORAL_TABLET | Freq: Two times a day (BID) | ORAL | Status: DC
  Administered 2015-09-16 – 2015-09-18 (×4): 12.5 mg via ORAL
  Filled 2015-09-16 (×4): qty 1

## 2015-09-16 MED ORDER — FUROSEMIDE 10 MG/ML IJ SOLN
40.0000 mg | Freq: Two times a day (BID) | INTRAMUSCULAR | Status: DC
  Administered 2015-09-16: 40 mg via INTRAVENOUS
  Filled 2015-09-16: qty 4

## 2015-09-16 MED ORDER — NICOTINE 21 MG/24HR TD PT24: 21.0000 mg | MEDICATED_PATCH | Freq: Every day | TRANSDERMAL | Status: DC

## 2015-09-16 MED ORDER — HEPARIN BOLUS VIA INFUSION
3000.0000 [IU] | Freq: Once | INTRAVENOUS | Status: AC
  Administered 2015-09-16: 3000 [IU] via INTRAVENOUS
  Filled 2015-09-16: qty 3000

## 2015-09-16 NOTE — Progress Notes (Signed)
Spoke with Dr. Earnest Conroy about patient's Cardizem drip running out and wasn't reordered, HR mid 90's to mid 100's on 15mg  Cardizem, was going to titrate down and saw it was almost empty.

## 2015-09-16 NOTE — Progress Notes (Signed)
Subjective:   Day of hospitalization: 1  VSS.  No overnight events.  Pt feels his breathing has improved.  He remains in afib with HR controlled.    Objective:   Vital signs in last 24 hours: Filed Vitals:   09/16/15 0505 09/16/15 0530 09/16/15 0600 09/16/15 0743  BP: 127/106 114/73 110/80   Pulse: 58 79 42   Temp:    97.7 F (36.5 C)  TempSrc:    Oral  Resp: 31 30 30    Height:      Weight:      SpO2: 94% 93% 95%     Weight: Filed Weights   09/15/15 1017 09/15/15 1551 09/16/15 0405  Weight: 180 lb (81.647 kg) 193 lb 4.8 oz (87.68 kg) 191 lb 9.6 oz (86.909 kg)    I/Os:  Intake/Output Summary (Last 24 hours) at 09/16/15 1255 Last data filed at 09/16/15 0600  Gross per 24 hour  Intake   1220 ml  Output    750 ml  Net    470 ml    Physical Exam: Constitutional: Vital signs reviewed.  Patient is sitting up in bed in no acute distress and cooperative with exam.   HEENT: [place]/AT; EOMI. Cardiovascular: Irregular, JVD noted.  Pulmonary/Chest: Normal respiratory effort, no accessory muscle use, diffuse wheezing throughout.   Neurological: A&O x3, CN II-XII grossly intact, moving all extremities.   Lab Results:  BMP:  Recent Labs Lab 09/15/15 1109 09/15/15 1558 09/16/15 0218  NA 128*  --  126*  K 4.0  --  3.7  CL 97*  --  95*  CO2 21*  --  18*  GLUCOSE 144*  --  181*  BUN 20  --  30*  CREATININE 0.96  --  1.37*  CALCIUM 8.8*  --  8.7*  MG  --  2.1  --     CBC:  Recent Labs Lab 09/15/15 1109 09/16/15 0218  WBC 11.4* 8.7  NEUTROABS 9.0*  --   HGB 13.5 12.7*  HCT 40.6 38.7*  MCV 88.1 87.6  PLT 236 243    Coagulation:  Recent Labs Lab 09/15/15 1109  LABPROT 17.4*  INR 1.41    CBG:           No results for input(s): GLUCAP in the last 168 hours.         HA1C:       Recent Labs Lab 09/15/15 1558  HGBA1C 5.9*    Lipid Panel:  Recent Labs Lab 09/16/15 0218  CHOL 131  HDL 24*  LDLCALC 93  TRIG 71  CHOLHDL 5.5     LFTs:  Recent Labs Lab 09/15/15 1109 09/16/15 0218  AST 81* 118*  ALT 122* 181*  ALKPHOS 81 79  BILITOT 1.3* 1.3*  PROT 6.0* 5.9*  ALBUMIN 3.6 3.5    Pancreatic Enzymes: No results for input(s): LIPASE, AMYLASE in the last 168 hours.  Lactic Acid/Procalcitonin: No results for input(s): LATICACIDVEN, PROCALCITON in the last 168 hours.  Ammonia: No results for input(s): AMMONIA in the last 168 hours.  Cardiac Enzymes:  Recent Labs Lab 09/15/15 1558 09/15/15 2016 09/16/15 0205  TROPONINI 0.08* 0.07* 0.07*    EKG: EKG Interpretation  Date/Time:  Wednesday September 15 2015 10:09:48 EDT Ventricular Rate:  141 PR Interval:    QRS Duration: 81 QT Interval:  288 QTC Calculation: 441 R Axis:   95 Text Interpretation:  Atrial fibrillation Right axis deviation Anteroseptal infarct, old Nonspecific repol abnormality, diffuse leads agree. no  STEMI.NO OLD COMP Confirmed by Donnald Garre, MD, Lebron Conners [phone]) on 09/15/2015 10:31:16 AM   BNP: No results for input(s): PROBNP in the last 168 hours.  D-Dimer: No results for input(s): DDIMER in the last 168 hours.  Urinalysis: No results for input(s): COLORURINE, LABSPEC, PHURINE, GLUCOSEU, HGBUR, BILIRUBINUR, KETONESUR, PROTEINUR, UROBILINOGEN, NITRITE, LEUKOCYTESUR in the last 168 hours.  Invalid input(s): APPERANCEUR  Micro Results: Recent Results (from the past 240 hour(s))  MRSA PCR Screening     Status: None   Collection Time: 09/15/15  4:19 PM  Result Value Ref Range Status   MRSA by PCR NEGATIVE NEGATIVE Final    Comment:        The GeneXpert MRSA Assay (FDA approved for NASAL specimens only), is one component of a comprehensive MRSA colonization surveillance program. It is not intended to diagnose MRSA infection nor to guide or monitor treatment for MRSA infections.     Blood Culture: No results found for: SDES, SPECREQUEST, CULT, REPTSTATUS  Studies/Results: Dg Chest 2 View  09/15/2015  CLINICAL DATA:   Acute onset shortness of breath this morning. Atrial fibrillation with rapid ventricular response. EXAM: CHEST  2 VIEW COMPARISON:  09/15/2015 FINDINGS: Cardiomegaly remains stable. Bibasilar predominant interstitial prominence has decreased since previous study, consistent with decreased interstitial edema. No evidence of pulmonary consolidation. Tiny bilateral pleural effusions noted. IMPRESSION: Decreased bibasilar interstitial prominence, consistent with decreased interstitial edema. Tiny bilateral pleural effusions. Stable cardiomegaly. Electronically Signed   By: Myles Rosenthal M.D.   On: 09/15/2015 15:31   Dg Chest Port 1 View  09/15/2015  CLINICAL DATA:  65 year old male with intermittent shortness of breath for 2 weeks. Labored respiration on presentation. Initial encounter. EXAM: PORTABLE CHEST 1 VIEW COMPARISON:  None. FINDINGS: Portable AP semi upright view at 1102 hours. Cardiomegaly. Other mediastinal contours are within normal limits. Visualized tracheal air column is within normal limits. Mildly increased pulmonary interstitial markings, more apparent at the lung bases. No superimposed pneumothorax, pleural effusion or consolidation. IMPRESSION: Cardiomegaly with mild basilar predominant increased interstitial markings. Differential considerations include chronic pulmonary changes, mild or developing interstitial edema, viral or atypical respiratory infection. Electronically Signed   By: Odessa Fleming M.D.   On: 09/15/2015 11:16    Medications:  Scheduled Meds: . aspirin  81 mg Oral Daily  . nicotine  21 mg Transdermal Daily   Continuous Infusions: . diltiazem (CARDIZEM) infusion 5 mg/hr (09/16/15 0402)  . heparin 1,700 Units/hr (09/16/15 0500)   PRN Meds: acetaminophen **OR** acetaminophen, benzonatate  Antibiotics: Antibiotics Given (last 72 hours)    None      Day of Hospitalization: 1  Consults: Treatment Team:  Rounding Lbcardiology, MD  Assessment/Plan:   Principal  Problem:   Atrial fibrillation with rapid ventricular response (HCC) Active Problems:   Tobacco abuse  Atrial fibrillation He remains on diltizam and heparin gtt.  HR controlled.  TSH normal.  Trop mildly elevated at 0.07.  BNP 919.  CXR with cardiomegaly and bibasilar interstitial edema.  CXR also with hyperinflation.   -will obtain CTA to r/o PE -echo today  -cardiology consult -cont diltiazem and heparin gtt for now  Hyponatremia Difficult to determine if chronic or acute since no previous values.  Not on any meds that may be contributing.   -urine/serum osmolality, sodium   Elevated transaminases Unclear etiology.  Denies ETOH. -check hepatitis panel  -will need RUQ [name]   Mild AKI Received lasix yesterday with a subsequent bump in SCr.   -monitor SCr -avoid nephrotoxins  F/E/N Fluids- None  Electrolytes- Replete as needed  Nutrition- HH/carb mod diet  VTE PPx  Heparin gtt  Disposition Disposition is deferred, awaiting improvement of current medical problems.  Anticipated discharge in approximately 1-2 day(s).     LOS: 1 day   Marrian Salvage, MD PGY-3, Internal Medicine Teaching Service 09/16/2015, 12:55 PM

## 2015-09-16 NOTE — Progress Notes (Signed)
Updated on VS, new orders,meds, test results and events of the day in the patient's room via Melissa RN using SBAR format, assumed care of the patient.

## 2015-09-16 NOTE — Progress Notes (Signed)
ANTICOAGULATION CONSULT NOTE - Follow Up Consult  Pharmacy Consult for heparin Indication: atrial fibrillation  Allergies  Allergen Reactions  . Codeine Itching    whelps  . Other     Seasonal allergies, "all spices, salt, pepper"    Patient Measurements: Height: 6' (182.9 cm) Weight: 191 lb 9.6 oz (86.909 kg) IBW/kg (Calculated) : 77.6 Heparin Dosing Weight: 87 kg  Vital Signs: Temp: 97.7 F (36.5 C) (06/08 0743) Temp Source: Oral (06/08 0743) BP: 112/89 mmHg (06/08 1106) Pulse Rate: 100 (06/08 1400)  Labs:  Recent Labs  09/15/15 1109 09/15/15 1558  09/15/15 2016 09/16/15 0205 09/16/15 0218 09/16/15 0220 09/16/15 0838 09/16/15 1649  HGB 13.5  --   --   --   --  12.7*  --   --   --   HCT 40.6  --   --   --   --  38.7*  --   --   --   PLT 236  --   --   --   --  243  --   --   --   LABPROT 17.4*  --   --   --   --   --   --   --   --   INR 1.41  --   --   --   --   --   --   --   --   HEPARINUNFRC  --   --   < >  --   --   --  0.12* 0.54 0.33  CREATININE 0.96  --   --   --   --  1.37*  --   --   --   TROPONINI 0.07* 0.08*  --  0.07* 0.07*  --   --   --   --   < > = values in this interval not displayed.  Estimated Creatinine Clearance: 59.8 mL/min (by C-G formula based on Cr of 1.37).  Medications:  See medical record  Assessment: 86 yoM with acute onset of Afib, pharmacy consulted to dose heparin. Pt not previously on anticoagulation. CBC stable. No bleeding reported.  Heparin level is therapeutic this morning at 0.54 on 1700 units/hr. Confirmatory level 0.33 remains in goal range this PM.  Goal of Therapy:  Heparin level 0.3-0.7 units/ml Monitor platelets by anticoagulation protocol: Yes   Plan:  Continue heparin infusion at 1700 units/hr Continue to monitor H&H and platelets daily   Jansen Sciuto S. Merilynn Finland, PharmD, BCPS Clinical Staff Pharmacist Pager [phone]   09/16/2015,5:50 PM

## 2015-09-16 NOTE — Progress Notes (Signed)
ANTICOAGULATION CONSULT NOTE - Follow Up Consult  Pharmacy Consult for heparin Indication: atrial fibrillation  Allergies  Allergen Reactions  . Codeine Itching    whelps  . Other     Seasonal allergies, "all spices, salt, pepper"    Patient Measurements: Height: 6' (182.9 cm) Weight: 191 lb 9.6 oz (86.909 kg) IBW/kg (Calculated) : 77.6 Heparin Dosing Weight: 87 kg  Vital Signs: Temp: 97.7 F (36.5 C) (06/08 0743) Temp Source: Oral (06/08 0743) BP: 110/80 mmHg (06/08 0600) Pulse Rate: 42 (06/08 0600)  Labs:  Recent Labs  09/15/15 1109 09/15/15 1558 09/15/15 1831 09/15/15 2016 09/16/15 0205 09/16/15 0218 09/16/15 0220 09/16/15 0838  HGB 13.5  --   --   --   --  12.7*  --   --   HCT 40.6  --   --   --   --  38.7*  --   --   PLT 236  --   --   --   --  243  --   --   LABPROT 17.4*  --   --   --   --   --   --   --   INR 1.41  --   --   --   --   --   --   --   HEPARINUNFRC  --   --  0.13*  --   --   --  0.12* 0.54  CREATININE 0.96  --   --   --   --  1.37*  --   --   TROPONINI 0.07* 0.08*  --  0.07* 0.07*  --   --   --     Estimated Creatinine Clearance: 59.8 mL/min (by C-G formula based on Cr of 1.37).  Medications:  See medical record  Assessment: 55 yoM with acute onset of Afib, pharmacy consulted to dose heparin. Pt not previously on anticoagulation. CBC stable. No bleeding reported. Heparin level is therapeutic this morning at 0.54 on 1700 units/hr. Will continue current rate and check a confirmatory level in 8 hours  Goal of Therapy:  Heparin level 0.3-0.7 units/ml Monitor platelets by anticoagulation protocol: Yes   Plan:  Continue heparin infusion at 1700 units/hr Check anti-Xa level in 8 hours and daily while on heparin Continue to monitor H&H and platelets   Thank you for allowing [name] to participate in this patients care. Signe Colt, PharmD Pager: [phone] 09/16/2015,10:11 AM

## 2015-09-16 NOTE — Progress Notes (Signed)
Subjective: Had dyspnea and cough overnight for which he was given Tessalon pearls. This morning, he expressed frustration to nursing that his team was focusing on his heart rather than his lungs and was concerned about receiving too much testing. After discussing this with the primary team and his ex-wife, he expressed understanding of the team's concern for a PE and want for further imaging.   His ex-wife provided additional history and noted that the patient has had some dyspnea over the last 6 months. 1 month ago he had the flu with 3-5 days of fever, nausea, vomiting, diarrhea, and powerful cough. 2 weeks ago, he went to urgent care for excessive sweating, was diagnosed with pneumonia, and given a "z-pack."  At the time of the interview, he felt his breathing was improved from when he came in and had found the duonebs helpful.  Objective: Vital signs in last 24 hours: Filed Vitals:   09/16/15 0505 09/16/15 0530 09/16/15 0600 09/16/15 0743  BP: 127/106 114/73 110/80   Pulse: 58 79 42   Temp:    97.7 F (36.5 C)  TempSrc:    Oral  Resp: 31 30 30    Height:      Weight:      SpO2: 94% 93% 95%     Intake/Output Summary (Last 24 hours) at 09/16/15 1241 Last data filed at 09/16/15 0600  Gross per 24 hour  Intake   1220 ml  Output    750 ml  Net    470 ml   BP 110/80 mmHg  Pulse 42  Temp(Src) 97.7 F (36.5 C) (Oral)  Resp 30  Ht 6' (1.829 m)  Wt 86.909 kg (191 lb 9.6 oz)  BMI 25.98 kg/m2  SpO2 95% Pulse per EKG - 101 General appearance: alert, cooperative, comfortably breathing at increased rate Lungs: Tachypneic. Crackles in bilateral basilar fields, diffuse coarse sounds on anterior and posterior chest, diffuse expiratory wheezing, and prolonged expiration.  Heart: irregularly irregular rhythm. Difficult to heard extra heart sounds given coarse lung sounds. JVD to jaw  Extremities: Trace pretibial pitting edema. RLE - no pallor, warm with equal temperature to LLE, 2+  dorsalis pedis   Lab Results:  Recent Labs Lab 09/15/15 1109 09/16/15 0218  WBC 11.4* 8.7  HGB 13.5 12.7*  HCT 40.6 38.7*  PLT 236 243    Recent Labs Lab 09/15/15 1109 09/16/15 0218  NA 128* 126*  K 4.0 3.7  CL 97* 95*  CO2 21* 18*  BUN 20 30*  CREATININE 0.96 1.37*  CALCIUM 8.8* 8.7*  PROT 6.0* 5.9*  BILITOT 1.3* 1.3*  ALKPHOS 81 79  ALT 122* 181*  AST 81* 118*  GLUCOSE 144* 181*   CARDIAC PROFILE   Troponin I    @1558  : 0.08 @2016 : 0.07 @0205   0.07         Studies/Results: Dg Chest 2 View  09/15/2015  CLINICAL DATA:  Acute onset shortness of breath this morning. Atrial fibrillation with rapid ventricular response. EXAM: CHEST  2 VIEW COMPARISON:  09/15/2015 FINDINGS: Cardiomegaly remains stable. Bibasilar predominant interstitial prominence has decreased since previous study, consistent with decreased interstitial edema. No evidence of pulmonary consolidation. Tiny bilateral pleural effusions noted. IMPRESSION: Decreased bibasilar interstitial prominence, consistent with decreased interstitial edema. Tiny bilateral pleural effusions. Stable cardiomegaly. Electronically Signed   By: Myles Rosenthal M.D.   On: 09/15/2015 15:31   Dg Chest Port 1 View  09/15/2015  CLINICAL DATA:  65 year old male with intermittent shortness of breath for 2  weeks. Labored respiration on presentation. Initial encounter. EXAM: PORTABLE CHEST 1 VIEW COMPARISON:  None. FINDINGS: Portable AP semi upright view at 1102 hours. Cardiomegaly. Other mediastinal contours are within normal limits. Visualized tracheal air column is within normal limits. Mildly increased pulmonary interstitial markings, more apparent at the lung bases. No superimposed pneumothorax, pleural effusion or consolidation. IMPRESSION: Cardiomegaly with mild basilar predominant increased interstitial markings. Differential considerations include chronic pulmonary changes, mild or developing interstitial edema, viral or atypical  respiratory infection. Electronically Signed   By: Odessa Fleming M.D.   On: 09/15/2015 11:16   Medications: I have reviewed the patient's current medications. Scheduled Meds: . aspirin  81 mg Oral Daily  . nicotine  21 mg Transdermal Daily   Continuous Infusions: . diltiazem (CARDIZEM) infusion 5 mg/hr (09/16/15 0402)  . heparin 1,700 Units/hr (09/16/15 0500)   PRN Meds:.acetaminophen **OR** acetaminophen, benzonatate Assessment/Plan: Principal Problem:   Atrial fibrillation with rapid ventricular response (HCC) Active Problems:   Tobacco abuse  Mr. [name] is a 65yoM with a history of tobacco use and 2 weeks of progressive dyspnea who was found to be in atrial fibrillation with RVR. Additionally, he was found to be tachypnic, have lung crackles and wheezing, have cardiomegaly and interstitial edema on CXR, elevated proBNP, mildly elevated troponin to 0.08, transaminitis, and hyponatremia.  #Atrial fibrillation Most likely lung disease, PE, or post-viral. Less likely given work-up are MI, infectious, thyroid disease, alcohol, and drugs.  At risk for lung disease 2/2 to 10-year of tobacco use. 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 PE less likely given no chest pain, LE swelling, or immobility. Risk factors include tobacco use. Likely no colonoscopy. Post-viral is possible with onset after flu vs. gastroenteritis 4 wks ago or pneumonia 2 wks ago. Cardiac etiology possible with possible old MI on EKG (inverted T waves). History less concerning for acute MI and trops ~0.07 x3. Cardiac risk factors - 5.9% and dyslipidemia with low HDL. Infection possible with WBC to 11.4 prior to receiving steroids. But WBC down to 8.7 today. Afebrile. No PNA on CXR. TSH nml. Denies heavy alcohol or drug use. Intermittently symptomatic x2 weeks, so liekly would have to anticoagulate adequately prior to attempting cardioversion. Cardioversion not emergently indicated as patient is hemodynamically stable. Elevated BNP  likely 2/2 to afib w/ RVR. HR <=110 overnight. - Echo results pending - CTA and venous dopplers given concern for PE as etiology of afib - Continue telemetry - Cardiology consulted today - Diltiazem titration via gtt. Avoid metoprolol given possible lung disease. HR currently at goal. Consider switching to po. - Heparin gtt. Consider transition to NOAC. ChadsVasc score 0.  #Dyspnea, wheezing Dyspnea most likely 2/2 to atrial fibrillation and subsequent HF from RVR. ABGs notable for respiratory alkalosis with pO2 to 79, likely 2/2 tachypnea. Also found to have diffuse wheezing and found duonebs helpful. 10-year history of 1 ppd may be too short to cause lung disease. Stopped scheduled duonebs given patient is tachycardic and has afib. Hyperinflation and interstitial edema on CXR. BNP 919.3 on admission. S/p Lasix 20mg  IV. Treated for COPD exacerbation in ED w/ solumedrol x1 and duonebs. RR upper 20s, continues to have crackles and wheezing on exam, and on 4L supp RA. - Ipratropium nebs q8h - Continue Tessalon pearls for cough. - Avoiding Lasix for now as Cr bumped to 1.36 today and pulmonary edema should improve with HR control. - Wean O2 as tolerated  #AKI Up to 1.36 from 0.96 on admission. Most likely 2/2  to lasix or low cardiac output. No IVF as patient has evidence of heart failure with crackles and JVD. - CMP in AM. Will be s/p dye load   #Hyponatremia 128 on admission > 126 on hospital day 1. Normal plasma osmolarity (284). Unknown baseline. Different for iso-osmolar hyponatremia includes pseudohyponatremia (2/2 to hyprglycemia, hyperlipidemia, hyperproteinemia) or recent irrigation. Patient's hyperglycemia to 181 does not account for hyponatremia. He does not have hyperlipidemia. Total protein is low at 5.9. SIADH and heart failure come to mind given recent pneumonia, current wheezing, smoking history, and current signs of HF. However, these are hypoosmolar causes of hyponatremia. Heart  failure and cirrhosis are also possible. - Recheck CMP tomorrow - If change in mental status, check Na - Urine osmolality and urine sodium pending but do not necessarily affect differential as serum osmolality is the distinguishing factor in work-up  #Transaminitis ALT and AST 2x upper limit of normal on admission and hospital day 1. AP nml. Bilirubin borderline elevated to 1.3. PT 17.4, INR 1.4. Nml Platelets. Differential includes congestive hepatopathy, NASH, infection, and Wilson's Disease. Most likely congestive hepatopathy given afib with RVR and evidence of CHF (cardiomegaly, elevated BNP, JVD, interstitial edema). NASH less likely given pre-diabetic A1c and most nml lipid panel (HDL 24).  Infection possible, especially for HepA with recent GI illness 1 month ago. Wilson's Disease would be a late presentation for a male. Alcohol or acetaminophen toxicity unlikely given no heavy use of either. If no improvement with HR control, consider further work-up. - Repeat CMP when RVR and heart failure improved. - Acute hepatitis panels ordered - Consider CMV and EBV ab - Consider RUQ ultrasound if does not resolve  #R foot pallor, no palpable pulses (found on doppler), and less brisk refill - resolved 6/8 Differential includes arterial thrombosis and paroxysmal DVT. Arterial thrombosis in context of afib most concerning. PAD less likely given symptoms are unilateral and now resolved.No history of trauma or claudication.  - ABIs -- If abnormal, get arterial doppler for evaluation of arterial thrombosis. - Venous dopplers - D/c neurovascular checks by nursing - If worsens (e.g. becomes painful), check arterial dopplers and consider vascular consult  # Tobacco abuse 10 pack-year history.  - Patch  while inpatient.  #VTE Prophylaxis - Heparin gtt  #FEN/GI - No IVF as pt is HDS and has interstitial edema. - Mild hypokalemic (3.7) on AM labs. Likely 2/2 Lasix. Recheck CMP tmrw. - Regular  diet  #Dispo - Likely d/c to home. Hope to d/c tomorrow pending work-up and HR control. - Needs PCP -- Asked CM to offer non-[facility] provider. Otherwise, will offer [facility].  This is a Psychologist, occupational Note.  The care of the patient was discussed with Dr. Archer Asa and the assessment and plan formulated with their assistance.  Please see their attached note for official documentation of the daily encounter.   LOS: 1 day   Newton Pigg, Med Student 09/16/2015, 12:41 PM

## 2015-09-16 NOTE — Progress Notes (Signed)
Spoke with Dr Earnest Conroy again to update on VS, patient's condition and that the patient is really coughing and needs something so that he can catch his breath, O2 bumped up to 4L at this time for resps in 30's and sats low 90's and notified MD of that, Reginald Hahn were ordered and will give when available in the system, no other changes at this time, spoke with the patient and his wife about A-Fib and gave some information regarding A-Fib and how to live with it. Patient is aware of what it is but is in denial of his condition at this time, will continue to instruct and answer questions as needed. Patient is very HOH, need to talk slowly so that he grasps what you say to him.

## 2015-09-16 NOTE — Plan of Care (Signed)
Problem: Education: Goal: Knowledge of disease or condition will improve Outcome: Progressing Reviewed booklet and other teaching information and answered questions asked between him and his daughter, will continue to teach as needed, reviewed how the call light works and how to use the white board for needs and to be able to call RN/NT as needed and patient verbalized understanding.

## 2015-09-16 NOTE — Consult Note (Signed)
Cardiology Consult    Patient ID: Reginald Hahn MRN: [id number], DOB/AGE: 12-10-1950   Admit date: 09/15/2015 Date of Consult: 09/16/2015  Primary Physician: No primary care provider on file. Primary Cardiologist: new - seen by Lavell Islam, MD Requesting Provider: Richardo Priest, MD  Patient Profile    65 year old male without prior cardiac history who presented to the ED with progressive dyspnea and fatigue and was found to be in rapid atrial fibrillation.  Past Medical History   Past Medical History  Diagnosis Date  . Allergy     seasonal allergies  . Arthritis   . Tobacco use     x40 years. 1 ppd    Past Surgical History  Procedure Laterality Date  . Appendectomy      Mid 1960s [childhood]  . Vasectomy       Allergies  Allergies  Allergen Reactions  . Codeine Itching    whelps  . Other     Seasonal allergies, "all spices, salt, pepper"    History of Present Illness    65 year old male with a prior history of tobacco abuse, smoking a pack a day for just about all of his adult life. She also has seasonal allergies. He does not routinely take medications at home. He lives locally and works as a Nutritional therapist. He notes that his job requires a significant amount of exertion and typically he is able to carry out his duties without experiencing chest pain or dyspnea. Over the past 6 months however, he has noticed a reduction in exercise tolerance along with dyspnea on exertion. As result, he has cut back on his work load some.  Approximately 5-6 weeks ago, he developed significant nausea with vomiting and diarrhea. He says he was fairly sick for about 3 days and doesn't feel like he ever really recovered. He has had some degree of fatigue since then, though he has been back at work. About 2 weeks ago, he was expressing cough and dyspnea and was seen at an urgent care and told he had pneumonia. He was placed on a Z-Pak. Following 5 days of antibiotics, he did not feel any better  and says that over the past 2 weeks, his dyspnea has only aggressively worsened. He has also been expressing some orthopnea. Due to progressive symptoms, he presented to urgent care on June 7, and was noted to be in atrial fibrillation with a rapid ventricular response. EMS was called and he was transported to the .  There, labs were notable for hyponatremia, elevated LFTs, a BNP of 919, mild leukocytosis, and mild troponin elevation. He was placed on IV diltiazem and heparin and admitted to internal medicine. Troponins have remained mildly elevated with a flat trend at 0.07 and 0.08. Heart rates have been in the 90s to low 100s on 5 mg of IV diltiazem. Due to concern for possible pulmonary embolus, CTA of the chest was performed and was negative for PE. It did show mild mediastinal and bilateral hilar adenopathy as well as a small right pleural effusion and bronchitic changes. Lower extremity ABIs were performed and were normal. Her extremity ultrasound was negative for DVT. Patient is currently asymptomatic and remains in atrial fibrillation. He currently denies chest pain or dyspnea at rest.  Inpatient Medications    . aspirin  81 mg Oral Daily  . ipratropium  0.5 mg Nebulization Q6H  . nicotine  21 mg Transdermal Daily  dilt gtt Heparin gtt  Family History    Family History  Problem Relation Age of Onset  . Heart disease Mother     died in her 56's.  . Prostate cancer Father     died in his 20's.  . Congenital heart disease Brother     died @ age 59.    Social History    Social History   Social History  . Marital Status: Married    Spouse Name: N/A  . Number of Children: N/A  . Years of Education: N/A   Occupational History  . Not on file.   Social History Main Topics  . Smoking status: Current Every Day Smoker -- 1.00 packs/day for 40 years    Types: Cigarettes  . Smokeless tobacco: Not on file     Comment: Initially told staff that he had only been smoking for 10  yrs but now says on and off for all of his life.  . Alcohol Use: No  . Drug Use: No     Comment: Used to smoke marijuana  . Sexual Activity: Not on file   Other Topics Concern  . 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 He has noted occasional night sweats over the past 6 weeks. No chills, fever, or weight changes.  Cardiovascular:  No chest pain, +++ dyspnea on exertion, no edema, +++ orthopnea, no palpitations, paroxysmal nocturnal dyspnea. Dermatological: No rash, lesions/masses Respiratory: +++ Nonproductive cough, +++ dyspnea Urologic: No hematuria, dysuria Abdominal:   He did have GI symptoms with nausea, vomiting, diarrhea abating approximately 4-5 weeks ago. No bright red blood per rectum, melena, or hematemesis Neurologic:  No visual changes, focal wkns, changes in mental status. All other systems reviewed and are otherwise negative except as noted above.  Physical Exam    Blood pressure 112/89, pulse 100, temperature 97.7 F (36.5 C), temperature source Oral, resp. rate 23, height 6' (1.829 m), weight 191 lb 9.6 oz (86.909 kg), SpO2 94 %.  General: Pleasant, NAD Psych: Normal affect. Neuro: Alert and oriented X 3. Moves all extremities spontaneously. HEENT: Normal with the exception of very hard of hearing  Neck: Supple without bruits or JVD. Lungs:  Resp regular and unlabored, diminished breath sounds throughout, more noticeable on the right base. Heart: Irregularly irregular, distant, no s3, s4, or murmurs. Abdomen: Soft, non-tender, non-distended, BS + x 4.  Extremities: No clubbing, cyanosis or edema. DP/PT/Radials 2+ and equal bilaterally.  Labs     Recent Labs  09/15/15 1109 09/15/15 1558 09/15/15 2016  09/16/15 0205  TROPONINI 0.07* 0.08* 0.07* 0.07*   Lab Results  Component Value Date   WBC 8.7 09/16/2015   HGB 12.7* 09/16/2015   HCT 38.7* 09/16/2015   MCV 87.6 09/16/2015   PLT 243 09/16/2015    Recent Labs Lab 09/16/15 0218  NA 126*  K 3.7  CL 95*  CO2 18*  BUN 30*  CREATININE 1.37*  CALCIUM 8.7*  PROT 5.9*  BILITOT 1.3*  ALKPHOS 79  ALT 181*  AST 118*  GLUCOSE 181*   Lab Results  Component Value Date   CHOL 131 09/16/2015   HDL 24* 09/16/2015   LDLCALC 93 09/16/2015   TRIG 71 09/16/2015     Radiology Studies  Dg Chest 2 View  09/15/2015  CLINICAL DATA:  Acute onset shortness of breath this morning. Atrial fibrillation with rapid ventricular response. EXAM: CHEST  2 VIEW COMPARISON:  09/15/2015 FINDINGS: Cardiomegaly remains stable. Bibasilar predominant interstitial prominence has decreased since previous study, consistent with decreased interstitial edema. No evidence of pulmonary consolidation. Tiny bilateral pleural effusions noted. IMPRESSION: Decreased bibasilar interstitial prominence, consistent with decreased interstitial edema. Tiny bilateral pleural effusions. Stable cardiomegaly. Electronically Signed   By: Myles Rosenthal M.D.   On: 09/15/2015 15:31   Ct Angio Chest Pe W/cm &/or Wo Cm  09/16/2015  CLINICAL DATA:  Shortness of breath, atrial fibrillation EXAM: CT ANGIOGRAPHY CHEST WITH CONTRAST TECHNIQUE: Multidetector CT imaging of the chest was performed using the standard protocol during bolus administration of intravenous contrast. Multiplanar CT image reconstructions and MIPs were obtained to evaluate the vascular anatomy. CONTRAST:  90 cc Isovue COMPARISON:  None. FINDINGS: Mediastinum/Lymph Nodes: Central airways are patent. Images of the thoracic inlet are unremarkable. A precarinal lymph node measures 1.2 by 1.8 cm. Sub- carinal lymph node measures 1.7 by 1.9 cm. Right hilar lymph node measures 1.3 by 1.4 cm. Left hilar lymph node measures 1.6 by 1.3  cm. A left anterior mediastinal lymph node just lateral to aorta measures 1.4 x 1.6 cm. The study is of excellent technical quality. There is no evidence of pulmonary embolus. Atherosclerotic calcifications of thoracic aorta are noted. No aortic aneurysm. Lungs/Pleura: There is small right pleural effusion. Trace left pleural effusion. Extensive bronchitic changes are noted in right lower lobe. Mild perihilar bronchitic changes are noted bilaterally. There is linear atelectasis or early infiltrate in right lower lobe laterally please see axial image 83. No pulmonary edema. Upper abdomen: The visualized upper abdomen shows no adrenal gland mass. Trace perihepatic ascites. No intrahepatic biliary ductal dilatation. Musculoskeletal: Sagittal images of the spine shows mild degenerative changes thoracic spine. Sagittal view of the sternum is unremarkable. No destructive rib lesions are noted. Review of the MIP images confirms the above findings. IMPRESSION: 1. No pulmonary embolus is noted. 2. Mild mediastinal and bilateral hilar adenopathy. 3. There is small right pleural effusion. Trace left pleural effusion. Significant bronchitic changes are noted in right lower lobe. Mild bronchitic changes are noted bilateral perihilar. There is linear atelectasis or early infiltrate right lower lobe anterolaterally. 4. Mild degenerative changes thoracic spine. Electronically Signed   By: Natasha Mead M.D.   On: 09/16/2015 15:38   Dg Chest Port 1 View  09/15/2015  CLINICAL DATA:  65 year old male with intermittent shortness of breath for 2 weeks. Labored respiration on presentation. Initial encounter. EXAM: PORTABLE CHEST 1 VIEW COMPARISON:  None. FINDINGS: Portable AP semi upright view at 1102 hours. Cardiomegaly. Other mediastinal contours are within normal limits. Visualized tracheal air column is within normal limits. Mildly increased pulmonary interstitial markings, more apparent at the lung bases. No superimposed  pneumothorax, pleural effusion or consolidation. IMPRESSION: Cardiomegaly with mild basilar predominant increased interstitial markings. Differential considerations include chronic pulmonary changes, mild or developing interstitial edema, viral or atypical respiratory infection. Electronically Signed   By: Odessa Fleming M.D.   On: 09/15/2015 11:16    ECG & Cardiac Imaging    Atrial fibrillation, 148, poor R-wave progression, nonspecific ST and T changes.  Follow-up ECG earlier this morning atrial fibrillation, 94, nonspecific ST T changes.  Assessment & Plan    1. New onset atrial fibrillation with rapid ventricular response: Patient has been experiencing generalized malaise and progressive dyspnea over the past  4-6 weeks in the setting of GI illness approximately 5-6 weeks ago followed by pneumonia approximately 2 weeks ago. At no point did he experience palpitations or chest discomfort. He says he was seen by a provider approximately 2 weeks ago at the time of the pneumonia diagnosis. He was not advised that he was tachycardic at that time. When seen by urgent care yesterday, he was in atrial fibrillation at a rate of 148. He is currently on IV diltiazem at 5 mg per hour with rates in the 90s to low 100s. He is currently being anticoagulated with heparin. He denies palpitations and his dyspnea is stable at rest. Echocardiogram has been performed and the read is currently pending. Potassium, magnesium, and TSH are normal.  Provided that his echo shows normal LV function, we could likely plan to transition him to oral diltiazem as well as a direct oral anticoagulant. If heart rate is relatively well rate controlled and symptoms improved, we could likely plan on outpatient cardioversion in 4 weeks. If however he remained symptomatic despite rate controlled A. fib, or if A. fib is not easy to rate control, we could consider TEE cardioversion during this hospitalization. If echo shows abnormal LV function, in the  setting of progressive dyspnea, he would likely need diagnostic catheterization, and thus he would be better served by being left on heparin for the time being. As far as long-term anticoagulation is concerned, his CHA2DS2VASc is zero and thus if he required cardioversion, he could likely come off of anticoagulation after 4 weeks.  2. Dyspnea on exertion/elevated troponin: Patient and ex-wife report that he has been having labored breathing and dyspnea on exertion over at least the past 6 months. He has never had chest pain. As above, the duration of his atrial fibrillation is unknown but he was seen by a provider approximately 2 weeks ago and was not advised that he was tachycardic. Mild troponin elevation likely represents demand ischemia in the setting of tachycardia and mild volume overload on admission. His BNP was moderately elevated and he did receive IV Lasix. He has since bumped his creatinine. Provided that LV function is normal, suspect that he would benefit from an inpatient Lexiscan Myoview prior to discharge.  3. Tobacco abuse: Complete cessation advised. After initially saying he was only smoking for the past 10 years, he admitted that he has been smoking just about all of his adult life.  4. Hyponatremia: Discussed slightly worse with diuresis. Follow.  5. Elevated LFTs: Not significantly volume overloaded. Await echo-question pulmonary hypertension or RV failure. CTA negative for PE. Otherwise workup per medicine.  Signed, Nicolasa Ducking, NP 09/16/2015, 5:23 PM

## 2015-09-16 NOTE — Progress Notes (Signed)
VASCULAR LAB PRELIMINARY  ARTERIAL  ABI completed: Within normal limits.    RIGHT    LEFT    PRESSURE WAVEFORM  PRESSURE WAVEFORM  BRACHIAL 134 Tri BRACHIAL 128 Tri  DP   DP    AT 153 Tri AT 136 Tri  PT 156 Tri PT 148 Tri  PER   PER    GREAT TOE  NA GREAT TOE  NA    RIGHT LEFT  ABI 1.16 1.1    Farrel Demark, RDMS, RVT  09/16/2015, 4:34 PM

## 2015-09-16 NOTE — Care Management Note (Addendum)
Case Management Note  Patient Details  Name: Reginald Hahn MRN: [id number] Date of Birth: 08/17/1950  Subjective/Objective: Pt admitted for Atrial Fib. Initiated on IV Cardizem gtt. Pt on IV Heparin plan for NOAC once stable for d/c. CM will provide a 30 day free card for patient and patient assistance via company- Pt is without insurance.                     Action/Plan: CM did call MD this am in regards to PCP needs. Pt states he visits with Dr. Juline Patch on [address], however he will be retiring soon. CM did state that the clinic could pick him up as a new patient. Per pt he will give it some thought. CM will continue to monitor for additional needs.   Expected Discharge Date:                  Expected Discharge Plan:  Home/Self Care  In-House Referral:  NA  Discharge planning Services  CM Consult, Follow-up appt scheduled  Post Acute Care Choice:  NA Choice offered to:  NA  DME Arranged:  N/A DME Agency:  NA  HH Arranged:  NA HH Agency:  NA  Status of Service:  Completed, signed off  Medicare Important Message Given:    Date Medicare IM Given:    Medicare IM give by:    Date Additional Medicare IM Given:    Additional Medicare Important Message give by:     If discussed at Long Length of Stay Meetings, dates discussed:    Additional Comments: 1439 09-20-15 Tomi Bamberger, RN,BSN [phone] Pt now on IV Milrinone gtt and IV lasix. CM will continue to monitor for additional disposition needs.   Gala Lewandowsky, RN 09/16/2015, 3:24 PM

## 2015-09-16 NOTE — Progress Notes (Signed)
Paged MD, as patient is agitated and states, " I want to go home." Pt states, " There is nothing wrong with my heart AT ALL." This RN tried to educate patient on Atrial Fibrillation, and the reason for the medications that he is on to help control his heart rate and rhythm. Patient very agitated. MD made aware. MD to come assess patient. Will continue to monitor.

## 2015-09-16 NOTE — Progress Notes (Signed)
VASCULAR LAB PRELIMINARY  PRELIMINARY  PRELIMINARY  PRELIMINARY  Bilateral lower extremity venous duplex completed.    Preliminary report:  There is no DVT or SVT noted in the bilateral lower extremities.   Reginald Hahn, RVT 09/16/2015, 1:52 PM

## 2015-09-16 NOTE — Progress Notes (Signed)
Echocardiogram 2D Echocardiogram has been performed.  Reginald Hahn 09/16/2015, 10:25 AM

## 2015-09-16 NOTE — Progress Notes (Signed)
Report given to patient in his room via [place] frmat, reviewed VS, meds, labs, tests and patient's general condition, assumed care of patient.

## 2015-09-16 NOTE — Progress Notes (Signed)
ANTICOAGULATION CONSULT NOTE - Follow Up Consult  Pharmacy Consult for heparin Indication: atrial fibrillation  Labs:  Recent Labs  09/15/15 1109 09/15/15 1558 09/15/15 1831 09/15/15 2016 09/16/15 0218 09/16/15 0220  HGB 13.5  --   --   --  12.7*  --   HCT 40.6  --   --   --  38.7*  --   PLT 236  --   --   --  243  --   LABPROT 17.4*  --   --   --   --   --   INR 1.41  --   --   --   --   --   HEPARINUNFRC  --   --  0.13*  --   --  0.12*  CREATININE 0.96  --   --   --   --   --   TROPONINI 0.07* 0.08*  --  0.07*  --   --     Assessment: 65yo male remains subtherapeutic on heparin after rate increase.  Goal of Therapy:  Heparin level 0.3-0.7 units/ml   Plan:  Will rebolus with heparin 3000 units and increase gtt by 3 units/kg/hr to 1700 units/hr and check level in 6hr.  Vernard Gambles, PharmD, BCPS  09/16/2015,2:49 AM

## 2015-09-16 NOTE — Plan of Care (Signed)
Problem: Education: Goal: Knowledge of disease or condition will improve Outcome: Progressing Reviewed some of the information given to patient 09/15/15 and gave him new information regarding new meds, what they are, dosages, what they do and side effects, also gave him some information related to CHF and reviewed some information and instructed on symptoms to call me for while in the hospital and he verbalized understanding, will continue to monitor and give education.

## 2015-09-17 DIAGNOSIS — I4891 Unspecified atrial fibrillation: Secondary | ICD-10-CM | POA: Insufficient documentation

## 2015-09-17 DIAGNOSIS — R570 Cardiogenic shock: Secondary | ICD-10-CM | POA: Insufficient documentation

## 2015-09-17 DIAGNOSIS — N179 Acute kidney failure, unspecified: Secondary | ICD-10-CM | POA: Diagnosis not present

## 2015-09-17 DIAGNOSIS — K767 Hepatorenal syndrome: Secondary | ICD-10-CM

## 2015-09-17 DIAGNOSIS — I480 Paroxysmal atrial fibrillation: Secondary | ICD-10-CM

## 2015-09-17 DIAGNOSIS — R778 Other specified abnormalities of plasma proteins: Secondary | ICD-10-CM

## 2015-09-17 DIAGNOSIS — I420 Dilated cardiomyopathy: Secondary | ICD-10-CM

## 2015-09-17 LAB — CBC
HEMATOCRIT: 40.4 % (ref 39.0–52.0)
Hemoglobin: 13.3 g/dL (ref 13.0–17.0)
MCH: 29.1 pg (ref 26.0–34.0)
MCHC: 32.9 g/dL (ref 30.0–36.0)
MCV: 88.4 fL (ref 78.0–100.0)
PLATELETS: 259 10*3/uL (ref 150–400)
RBC: 4.57 MIL/uL (ref 4.22–5.81)
RDW: 14.3 % (ref 11.5–15.5)
WBC: 19.9 10*3/uL — AB (ref 4.0–10.5)

## 2015-09-17 LAB — DIFFERENTIAL
Basophils Absolute: 0 10*3/uL (ref 0.0–0.1)
Basophils Relative: 0 %
EOS PCT: 0 %
Eosinophils Absolute: 0 10*3/uL (ref 0.0–0.7)
LYMPHS ABS: 0.9 10*3/uL (ref 0.7–4.0)
LYMPHS PCT: 4 %
MONO ABS: 1.7 10*3/uL — AB (ref 0.1–1.0)
Monocytes Relative: 9 %
NEUTROS ABS: 16.9 10*3/uL — AB (ref 1.7–7.7)
Neutrophils Relative %: 87 %

## 2015-09-17 LAB — COMPREHENSIVE METABOLIC PANEL
ALT: 668 U/L — ABNORMAL HIGH (ref 17–63)
ANION GAP: 15 (ref 5–15)
AST: 489 U/L — AB (ref 15–41)
Albumin: 3.7 g/dL (ref 3.5–5.0)
Alkaline Phosphatase: 86 U/L (ref 38–126)
BILIRUBIN TOTAL: 1.4 mg/dL — AB (ref 0.3–1.2)
BUN: 53 mg/dL — AB (ref 6–20)
CHLORIDE: 93 mmol/L — AB (ref 101–111)
CO2: 20 mmol/L — ABNORMAL LOW (ref 22–32)
Calcium: 8.6 mg/dL — ABNORMAL LOW (ref 8.9–10.3)
Creatinine, Ser: 1.66 mg/dL — ABNORMAL HIGH (ref 0.61–1.24)
GFR, EST AFRICAN AMERICAN: 49 mL/min — AB (ref >60)
GFR, EST NON AFRICAN AMERICAN: 42 mL/min — AB (ref >60)
Glucose, Bld: 135 mg/dL — ABNORMAL HIGH (ref 65–99)
POTASSIUM: 4 mmol/L (ref 3.5–5.1)
Sodium: 128 mmol/L — ABNORMAL LOW (ref 135–145)
TOTAL PROTEIN: 5.9 g/dL — AB (ref 6.5–8.1)

## 2015-09-17 LAB — BILIRUBIN, FRACTIONATED(TOT/DIR/INDIR)
BILIRUBIN DIRECT: 0.5 mg/dL (ref 0.1–0.5)
BILIRUBIN INDIRECT: 0.7 mg/dL (ref 0.3–0.9)
BILIRUBIN TOTAL: 1.2 mg/dL (ref 0.3–1.2)

## 2015-09-17 LAB — CORTISOL-AM, BLOOD: CORTISOL - AM: 47.4 ug/dL — AB (ref 6.7–22.6)

## 2015-09-17 LAB — CK TOTAL AND CKMB (NOT AT ARMC)
CK, MB: 32.9 ng/mL — ABNORMAL HIGH (ref 0.5–5.0)
RELATIVE INDEX: 4.3 — AB (ref 0.0–2.5)
Total CK: 770 U/L — ABNORMAL HIGH (ref 49–397)

## 2015-09-17 LAB — HEPARIN LEVEL (UNFRACTIONATED)
HEPARIN UNFRACTIONATED: 0.41 [IU]/mL (ref 0.30–0.70)
HEPARIN UNFRACTIONATED: 0.35 [IU]/mL (ref 0.30–0.70)
Heparin Unfractionated: 0.19 IU/mL — ABNORMAL LOW (ref 0.30–0.70)

## 2015-09-17 MED ORDER — ALPRAZOLAM 0.5 MG PO TABS
0.5000 mg | ORAL_TABLET | Freq: Once | ORAL | Status: AC
  Administered 2015-09-17: 0.5 mg via ORAL
  Filled 2015-09-17: qty 1

## 2015-09-17 MED ORDER — MILRINONE LACTATE IN DEXTROSE 20-5 MG/100ML-% IV SOLN
0.2500 ug/kg/min | INTRAVENOUS | Status: DC
  Filled 2015-09-17: qty 100

## 2015-09-17 MED ORDER — FUROSEMIDE 10 MG/ML IJ SOLN
40.0000 mg | Freq: Two times a day (BID) | INTRAMUSCULAR | Status: DC
  Administered 2015-09-17: 40 mg via INTRAMUSCULAR
  Filled 2015-09-17: qty 4

## 2015-09-17 MED ORDER — FUROSEMIDE 10 MG/ML IJ SOLN
40.0000 mg | Freq: Two times a day (BID) | INTRAMUSCULAR | Status: DC
  Administered 2015-09-17: 40 mg via INTRAVENOUS
  Filled 2015-09-17: qty 4

## 2015-09-17 MED ORDER — FUROSEMIDE 10 MG/ML IJ SOLN
40.0000 mg | Freq: Two times a day (BID) | INTRAMUSCULAR | Status: DC
  Administered 2015-09-18: 40 mg via INTRAVENOUS
  Filled 2015-09-17: qty 4

## 2015-09-17 NOTE — Progress Notes (Signed)
Pt refusing bed alarm at this time. Pt educated on safety and to call when needing to get up. Pt verbalized understanding. Call bell and phone within reach.    Reginold Agent, RN

## 2015-09-17 NOTE — Progress Notes (Signed)
Subjective: Had ABI, venous dopplers, CTA, and echo yesterday.  No complaints this morning. Feels he is breathing well. Has not been out of bed much. Expresses understanding with the plan for diuresis and further work-up.   Objective: Vital signs in last 24 hours: Filed Vitals:   09/16/15 2347 09/17/15 0123 09/17/15 0408 09/17/15 0829  BP: 110/75  119/73 104/88  Pulse: 82  95 58  Temp: 97.5 F (36.4 C)  98 F (36.7 C) 98.5 F (36.9 C)  TempSrc: Oral  Oral Oral  Resp: 22  24   Height:      Weight:   87.862 kg (193 lb 11.2 oz)   SpO2: 94% 95% 91% 95%   Weight change: 6.214 kg (13 lb 11.2 oz)  Intake/Output Summary (Last 24 hours) at 09/17/15 1045 Last data filed at 09/17/15 0927  Gross per 24 hour  Intake   1659 ml  Output   1520 ml  Net    139 ml   BP 104/88 mmHg  Pulse 58  Temp(Src) 98.5 F (36.9 C) (Oral)  Resp 24  Ht 6' (1.829 m)  Wt 87.862 kg (193 lb 11.2 oz)  BMI 26.26 kg/m2  SpO2 95% General appearance: alert, cooperative, no distress and comfortable lying in bed talking, good color, comfortable breathing, [place] out of nose with supp O2 at 4L Eyes: No scleral icterus Lungs: Diffuse end expiratory wheezing. Crackles in posterior LLL. Heart: irregularly irregular rhythm, no m/r/g; JVD to jawline at 30 degrees Extremities: no cyanosis, 1+ pretibial pitting edema in RLE and trace in LLE, feet warm and without pallor. 2+ DP on R foot. Unable to palpation DP on L foot but warmth and color reassuring.  Lab Results:  Recent Labs Lab 09/15/15 1109 09/16/15 0218 09/17/15 0415  WBC 11.4* 8.7 19.9*  HGB 13.5 12.7* 13.3  HCT 40.6 38.7* 40.4  PLT 236 243 259    Recent Labs Lab 09/15/15 1109 09/16/15 0218 09/17/15 0415 09/17/15 0814  NA 128* 126* 128*  --   K 4.0 3.7 4.0  --   CL 97* 95* 93*  --   CO2 21* 18* 20*  --   BUN 20 30* 53*  --   CREATININE 0.96 1.37* 1.66*  --   CALCIUM 8.8* 8.7* 8.6*  --   PROT 6.0* 5.9* 5.9*  --   BILITOT 1.3* 1.3* 1.4* 1.2    ALKPHOS 81 79 86  --   ALT 122* 181* 668*  --   AST 81* 118* 489*  --   GLUCOSE 144* 181* 135*  --    Studies/Results: Ct Angio Chest Pe W/cm &/or Wo Cm  09/16/2015  CLINICAL DATA:  Shortness of breath, atrial fibrillation EXAM: CT ANGIOGRAPHY CHEST WITH CONTRAST TECHNIQUE: Multidetector CT imaging of the chest was performed using the standard protocol during bolus administration of intravenous contrast. Multiplanar CT image reconstructions and MIPs were obtained to evaluate the vascular anatomy. CONTRAST:  90 cc Isovue COMPARISON:  None. FINDINGS: Mediastinum/Lymph Nodes: Central airways are patent. Images of the thoracic inlet are unremarkable. A precarinal lymph node measures 1.2 by 1.8 cm. Sub- carinal lymph node measures 1.7 by 1.9 cm. Right hilar lymph node measures 1.3 by 1.4 cm. Left hilar lymph node measures 1.6 by 1.3 cm. A left anterior mediastinal lymph node just lateral to aorta measures 1.4 x 1.6 cm. The study is of excellent technical quality. There is no evidence of pulmonary embolus. Atherosclerotic calcifications of thoracic aorta are noted. No aortic aneurysm. Lungs/Pleura:  There is small right pleural effusion. Trace left pleural effusion. Extensive bronchitic changes are noted in right lower lobe. Mild perihilar bronchitic changes are noted bilaterally. There is linear atelectasis or early infiltrate in right lower lobe laterally please see axial image 83. No pulmonary edema. Upper abdomen: The visualized upper abdomen shows no adrenal gland mass. Trace perihepatic ascites. No intrahepatic biliary ductal dilatation. Musculoskeletal: Sagittal images of the spine shows mild degenerative changes thoracic spine. Sagittal view of the sternum is unremarkable. No destructive rib lesions are noted. Review of the MIP images confirms the above findings. IMPRESSION: 1. No pulmonary embolus is noted. 2. Mild mediastinal and bilateral hilar adenopathy. 3. There is small right pleural effusion. Trace  left pleural effusion. Significant bronchitic changes are noted in right lower lobe. Mild bronchitic changes are noted bilateral perihilar. There is linear atelectasis or early infiltrate right lower lobe anterolaterally. 4. Mild degenerative changes thoracic spine. Electronically Signed   By: Natasha Mead M.D.   On: 09/16/2015 15:38    Medications:  Scheduled: . aspirin  81 mg Oral Daily  . ipratropium  0.5 mg Nebulization Q6H  . metoprolol tartrate  12.5 mg Oral BID  . nicotine  21 mg Transdermal Daily   Scheduled Meds: . aspirin  81 mg Oral Daily  . ipratropium  0.5 mg Nebulization Q6H  . metoprolol tartrate  12.5 mg Oral BID  . nicotine  21 mg Transdermal Daily   Continuous Infusions: . diltiazem (CARDIZEM) infusion Stopped (09/17/15 0506)  . heparin 1,950 Units/hr (09/17/15 0629)   PRN Meds:.benzonatate Assessment/Plan: Principal Problem:   Atrial fibrillation with rapid ventricular response (HCC) Active Problems:   Tobacco abuse   Acute combined systolic and diastolic heart failure (HCC)   Transaminitis   AKI (acute kidney injury) [facility])  Reginald Hahn is a 49yoM with a history of tobacco use and 2 weeks of progressive dyspnea who was found to be in atrial fibrillation with RVR. Additionally, he was found to be tachypnic, have lung crackles and wheezing, have cardiomegaly and interstitial edema on CXR, elevated proBNP, mildly elevated troponin to 0.08, transaminitis, and hyponatremia. Found to have an EF of 15% on 09/16/15.  #Atrial fibrillation Most likely viral cardiomyopathy s/p PNA and flu vs. AGI or lung disease. Less likely given work-up are PE, MI, infectious, thyroid disease, alcohol, and drugs. At risk for lung disease 2/2 to 10-year of tobacco use and bronchitic changes on CT. 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 No VTE on venous dopplers or CTA 6/8. Cardiac etiology possible with possible old MI on EKG (inverted T waves). History less concerning for acute MI and  trops ~0.07 x3. Cardiac risk factors - 5.9% and dyslipidemia with low HDL. TSH nml. Denies heavy alcohol or drug use. Found to have EF of 15%, RAD, and diffuse LV hypokinesis. Cards initially recommended diuresis with catheterization once euvolemic. However, diuresis is complicated by AKI (s/p dye load and Lasix yesterday). Cardiology discussed CVP monitoring with PICC and starting milrinone. Patient wishes to discuss with ex-wife. HR control within goal (<=110). - Continue telemetry - Metop tartrate 12.5 BID started 6/8. Dilt stopped and CCB to be avoided given low EF. - Holding Lasix 40mg  IV BID (started 6/8 PM) - Cath when euvolemic for ischemic evaluation - Heparin until cath. Then convert to NOAC. - Cardioversion after 4 wks on NOAC. If symptomatic or HR hard to control inpatient, TEE and inpatient cardioversion. - F/u echo in 3 months - Cards outpatient f/u to consider ICD -  ACE-I/ARB and spironolactone prior to d/c if BP can tolerate   #Dyspnea, wheezing Dyspnea most likely 2/2 to atrial fibrillation and subsequent HF from RVR. ABGs notable for respiratory alkalosis with pO2 to 79, likely 2/2 tachypnea. Also found to have diffuse wheezing and found duonebs helpful. Smoking on and off during life with 1 ppd x10-year. Stopped scheduled duonebs given patient is tachycardic and has afib. Hyperinflation and interstitial edema on CXR. L pleural effusion on CT. BNP 919.3 on admission. Treated for COPD exacerbation in ED w/ solumedrol x1 and duonebs. S/p Lasix  x2 but stopped for AKI. RR improving. L have basilar crackles and diffuse wheezing on exam. On suppO2 but [place] out of nose and feels fine. - Ipratropium nebs q8h - Continue Tessalon pearls for cough. - Holding Lasix (see above). - Wean O2 as tolerated - Consider otuaptient  #AKI Up to 1.66 < 1.36 < 0.96 on admission. Received dye load and Lasix  IV yesterday. Most likely 2/2 to lasix vs. low cardiac output. Avoiding IVF as patient has  EF 15% and JVD. Holding Lasix for now. Cardiology considering CVP monitoring and milrinone. Pt currently discussing with ex-wife. - CMP in AM.   #Hypotonic hyponatremia 128 < 126 < 128 on admission. Calc osm 273 and measured plasma osm 284. Osmolol gap of 11 borderline high (10 is usually ULN) but unlikely had alcohol ingestion. Most likely 2/2 heart failure given EF 15%, JVD, and urine Na <10. Cirrhosis and nephrosis also on differential. Cirrhosis unlikely based on CT. Cortisol to 47.4, making adrenal insufficiency unlikely but in setting of acute illness. - Recheck CMP tomorrow - If change in mental status, check Na - Obtain more hx about possible ingestion given transaminitis and elevated osmolol gap.  #Transaminitis ALT and AST 10-11x ULN today, up from 2x ULN on admission. Bilirubin borderline elevated to 1.4 with indirect 0.7. AP and platelents normal. PT elevated 17.4. Differential includes congestive hepatopathy, DILI, NASH, infection, and Wilson's Disease. Most likely congestive hepatopathy given afib with RVR and evidence of CHF (cardiomegaly, elevated BNP, JVD, EF15%). DILI - Pt on no medications at home and meds given in hospital do not commonly cause DILI. NASH less likely given rising transaminase, pre-diabetic A1c, and no hyperlipidemia. Infection possible, especially with recent GI illness 1 month ago and PNA 2 weeks ago. Wilson's Disease would be a late presentation for a male. Alcohol or acetaminophen toxicity unlikely given no heavy use of either. No evidence of hepatosteatosis or cirrhosis on CTA. - AM CMP - Acute hepatitis panels ordered - Consider CMV and EBV ab  #Leukocytosis 19.9 < 8.7 < 11 on admission. Received solumedrol in ED. Afebrile - Diff ordered - AM CBC  #R foot pallor, no palpable pulses (found on doppler), and less brisk refill - resolved 6/8 Possible arterial thrombosis that resolved with heparin. ABI nml. No DVT on venous dopplers.   # Tobacco  abuse 1ppd x last 10 years plus intermittent use throughout life.  - Patch  while inpatient.  #VTE Prophylaxis - Heparin gtt - Transition to NOAC after cath  #FEN/GI - No IVF as pt is HDS and has interstitial edema. - K 4, AM CMP - Regular diet  #Dispo - Likely d/c to home.  - Had been seen by [facility] in the past? If wants to follow with [facility], consider IMC.  This is a Psychologist, occupational Note.  The care of the patient was discussed with Dr. Heywood Iles and the assessment and plan formulated with their  assistance.  Please see their attached note for official documentation of the daily encounter.   LOS: 2 days   Newton Pigg, Med Student 09/17/2015, 10:45 AM

## 2015-09-17 NOTE — Progress Notes (Signed)
Nash Dimmer Cothren RN discussed PICC placement with patient & spouse including risks, benefits, and alternatives. Requested placement be delayed until 09-18-15 so that they can discuss placement with MD. Primary RN notified.

## 2015-09-17 NOTE — Progress Notes (Signed)
Subjective: This morning, he did not report any complaints, like chest pain, difficulty breathing, abdominal pain.  We reviewed with him the results of the CTA from yesterday which was reassuring for no pulmonary embolism. We also explained to him that the problems with his heart may be related to the viral illness he experienced about a month ago.  Objective: Vital signs in last 24 hours: Filed Vitals:   09/16/15 2347 09/17/15 0123 09/17/15 0408 09/17/15 0829  BP: 110/75  119/73 104/88  Pulse: 82  95 58  Temp: 97.5 F (36.4 C)  98 F (36.7 C) 98.5 F (36.9 C)  TempSrc: Oral  Oral Oral  Resp: 22  24   Height:      Weight:   193 lb 11.2 oz (87.862 kg)   SpO2: 94% 95% 91% 95%   Weight change: 13 lb 11.2 oz (6.214 kg)  Intake/Output Summary (Last 24 hours) at 09/17/15 1027 Last data filed at 09/17/15 0927  Gross per 24 hour  Intake   1659 ml  Output   1520 ml  Net    139 ml   General: resting in bed, incredibly hard of hearing HEENT: JVD noted to the angle of jaw, no scleral icterus Cardiac: Irregularly regular rhythm Pulm: Trace inspiratory crackles right greater than left Abd: soft, nontender, nondistended, BS present Ext: warm and well perfused, no pedal edema Neuro: responds to questions appropriately; moving all extremities freely   Lab Results: Basic Metabolic Panel:  Recent Labs Lab 09/15/15 1558 09/16/15 0218 09/17/15 0415  NA  --  126* 128*  K  --  3.7 4.0  CL  --  95* 93*  CO2  --  18* 20*  GLUCOSE  --  181* 135*  BUN  --  30* 53*  CREATININE  --  1.37* 1.66*  CALCIUM  --  8.7* 8.6*  MG 2.1  --   --    Liver Function Tests:  Recent Labs Lab 09/16/15 0218 09/17/15 0415 09/17/15 0814  AST 118* 489*  --   ALT 181* 668*  --   ALKPHOS 79 86  --   BILITOT 1.3* 1.4* 1.2  PROT 5.9* 5.9*  --   ALBUMIN 3.5 3.7  --    CBC:  Recent Labs Lab 09/15/15 1109 09/16/15 0218 09/17/15 0415  WBC 11.4* 8.7 19.9*  NEUTROABS 9.0*  --   --   HGB 13.5  12.7* 13.3  HCT 40.6 38.7* 40.4  MCV 88.1 87.6 88.4  PLT 236 243 259   Cardiac Enzymes:  Recent Labs Lab 09/15/15 1558 09/15/15 2016 09/16/15 0205  TROPONINI 0.08* 0.07* 0.07*   Hemoglobin A1C:  Recent Labs Lab 09/15/15 1558  HGBA1C 5.9*   Fasting Lipid Panel:  Recent Labs Lab 09/16/15 0218  CHOL 131  HDL 24*  LDLCALC 93  TRIG 71  CHOLHDL 5.5   Thyroid Function Tests:  Recent Labs Lab 09/15/15 1558  TSH 1.387   Coagulation:  Recent Labs Lab 09/15/15 1109  LABPROT 17.4*  INR 1.41   Urine Drug Screen: Drugs of Abuse     Component Value Date/Time   LABOPIA NONE DETECTED 09/15/2015 2004   COCAINSCRNUR NONE DETECTED 09/15/2015 2004   LABBENZ POSITIVE* 09/15/2015 2004   AMPHETMU NONE DETECTED 09/15/2015 2004   THCU POSITIVE* 09/15/2015 2004   LABBARB NONE DETECTED 09/15/2015 2004    Micro Results: Recent Results (from the past 240 hour(s))  MRSA PCR Screening     Status: None   Collection Time:  09/15/15  4:19 PM  Result Value Ref Range Status   MRSA by PCR NEGATIVE NEGATIVE Final    Comment:        The GeneXpert MRSA Assay (FDA approved for NASAL specimens only), is one component of a comprehensive MRSA colonization surveillance program. It is not intended to diagnose MRSA infection nor to guide or monitor treatment for MRSA infections.    Studies/Results: Dg Chest 2 View  09/15/2015  CLINICAL DATA:  Acute onset shortness of breath this morning. Atrial fibrillation with rapid ventricular response. EXAM: CHEST  2 VIEW COMPARISON:  09/15/2015 FINDINGS: Cardiomegaly remains stable. Bibasilar predominant interstitial prominence has decreased since previous study, consistent with decreased interstitial edema. No evidence of pulmonary consolidation. Tiny bilateral pleural effusions noted. IMPRESSION: Decreased bibasilar interstitial prominence, consistent with decreased interstitial edema. Tiny bilateral pleural effusions. Stable cardiomegaly.  Electronically Signed   By: Myles Rosenthal M.D.   On: 09/15/2015 15:31   Ct Angio Chest Pe W/cm &/or Wo Cm  09/16/2015  CLINICAL DATA:  Shortness of breath, atrial fibrillation EXAM: CT ANGIOGRAPHY CHEST WITH CONTRAST TECHNIQUE: Multidetector CT imaging of the chest was performed using the standard protocol during bolus administration of intravenous contrast. Multiplanar CT image reconstructions and MIPs were obtained to evaluate the vascular anatomy. CONTRAST:  90 cc Isovue COMPARISON:  None. FINDINGS: Mediastinum/Lymph Nodes: Central airways are patent. Images of the thoracic inlet are unremarkable. A precarinal lymph node measures 1.2 by 1.8 cm. Sub- carinal lymph node measures 1.7 by 1.9 cm. Right hilar lymph node measures 1.3 by 1.4 cm. Left hilar lymph node measures 1.6 by 1.3 cm. A left anterior mediastinal lymph node just lateral to aorta measures 1.4 x 1.6 cm. The study is of excellent technical quality. There is no evidence of pulmonary embolus. Atherosclerotic calcifications of thoracic aorta are noted. No aortic aneurysm. Lungs/Pleura: There is small right pleural effusion. Trace left pleural effusion. Extensive bronchitic changes are noted in right lower lobe. Mild perihilar bronchitic changes are noted bilaterally. There is linear atelectasis or early infiltrate in right lower lobe laterally please see axial image 83. No pulmonary edema. Upper abdomen: The visualized upper abdomen shows no adrenal gland mass. Trace perihepatic ascites. No intrahepatic biliary ductal dilatation. Musculoskeletal: Sagittal images of the spine shows mild degenerative changes thoracic spine. Sagittal view of the sternum is unremarkable. No destructive rib lesions are noted. Review of the MIP images confirms the above findings. IMPRESSION: 1. No pulmonary embolus is noted. 2. Mild mediastinal and bilateral hilar adenopathy. 3. There is small right pleural effusion. Trace left pleural effusion. Significant bronchitic changes  are noted in right lower lobe. Mild bronchitic changes are noted bilateral perihilar. There is linear atelectasis or early infiltrate right lower lobe anterolaterally. 4. Mild degenerative changes thoracic spine. Electronically Signed   By: Natasha Mead M.D.   On: 09/16/2015 15:38   Dg Chest Port 1 View  09/15/2015  CLINICAL DATA:  65 year old male with intermittent shortness of breath for 2 weeks. Labored respiration on presentation. Initial encounter. EXAM: PORTABLE CHEST 1 VIEW COMPARISON:  None. FINDINGS: Portable AP semi upright view at 1102 hours. Cardiomegaly. Other mediastinal contours are within normal limits. Visualized tracheal air column is within normal limits. Mildly increased pulmonary interstitial markings, more apparent at the lung bases. No superimposed pneumothorax, pleural effusion or consolidation. IMPRESSION: Cardiomegaly with mild basilar predominant increased interstitial markings. Differential considerations include chronic pulmonary changes, mild or developing interstitial edema, viral or atypical respiratory infection. Electronically Signed   By: Rexene Edison  Margo Aye M.D.   On: 09/15/2015 11:16   Medications: I have reviewed the patient's current medications. Scheduled Meds: . aspirin  81 mg Oral Daily  . ipratropium  0.5 mg Nebulization Q6H  . metoprolol tartrate  12.5 mg Oral BID  . nicotine  21 mg Transdermal Daily   Continuous Infusions: . diltiazem (CARDIZEM) infusion Stopped (09/17/15 0506)  . heparin 1,950 Units/hr (09/17/15 0629)   PRN Meds:.acetaminophen **OR** acetaminophen, benzonatate Assessment/Plan:  Mr. Herbst is a 65 year old man with tobacco use hospitalized for atrial fibrillation with RVR found to have heart failure with reduced ejection fraction, abnormal LFTs, hyponatremia.  Heart failure with reduced ejection fraction: Likely mediated by arrhythmia though he does have risk factors for ischemic disease as evident by atherosclerosis of the thoracic aorta on CT  imaging. Echo 09/15/68 notable for EF 15%, severe diffuse hypokinesis, mid-moderate tricuspid regurg, severe RA dilatation, mild LA dilatation.  -Continue Lasix 40 mg IV twice daily to diurese given findings of hypervolemia on exam though consider holding given increase in BUN/creatinine and recent dye load -Currently following, appreciate recommendations  New onset atrial fibrillation: Suspicion still remains for viral myocarditis given cardiomegaly on initial chest x-ray in the absence of other causes by workup so far. Heart rate is mostly in the low 100s though there were some readings as low as 29 and 30 which I suspect may be erroneous over the last 24 hours. -Continue telemetry -Continue metoprolol 12.5 mg twice daily -Continue heparin IV with plan to transition to oral anticoagulant following cardiac catheterization  Abnormal LFTs: ALT and AST continue to trend upwards today at 10 times the upper limit of normal. Bilirubin persistently elevated at 1.2. Abdominal exam is reassuring, and it may be related to congestive hepatopathy. -Follow-up acute hepatitis panel and HIV -Check CMET tomorrow  Hyponatremia: Suspect hypotonic hypervolemic hyponatremia. Urine osmolality over 100 which is consistent with ADH secretions though urine sodium undetectable which is consistent with retention in the setting of decreased effective arterial volume. Sodium 128 today, up from 126 yesterday though stable from admission.  -IV diuresis per cardiology  #FEN:  -Diet: Heart healthy/carb modified  #DVT prophylaxis: Heparin IV  #CODE STATUS: FULL CODE -Confirmed with patient on admission  Dispo: Disposition is deferred at this time, awaiting improvement of current medical problems.  .   The patient does not have a current PCP (No primary care provider on file.) and does need an [facility] hospital follow-up appointment after discharge.  The patient does not have transportation limitations that hinder  transportation to clinic appointments.  .Services Needed at time of discharge: Y = Yes, Blank = No PT:   OT:   RN:   Equipment:   Other:     LOS: 2 days   Beather Arbour, MD 09/17/2015, 10:27 AM

## 2015-09-17 NOTE — Progress Notes (Signed)
After discussion with pt and ex-wife, pt is agreeable to having PICC line placed and starting milrinone.    Reginold Agent, RN

## 2015-09-17 NOTE — Progress Notes (Signed)
Spent 45 min talking with [name] and his ex-wife.  He is now agreeable to getting a PICC line.  We will place a PICC and start milrinone.  Will get bid Co-Ox and restart lasix once milrinone has been started.  Will ask heart failure to evaluate.  Plan for [facility] Monday if renal function permits.   Reginald Fern C. Duke Salvia, MD, [facility]  09/17/2015 7:05 PM

## 2015-09-17 NOTE — Progress Notes (Signed)
Patient Name: Reginald Hahn Date of Encounter: 09/17/2015  Principal Problem:   Atrial fibrillation with rapid ventricular response (HCC) Active Problems:   Tobacco abuse   Acute combined systolic and diastolic heart failure [facility])   Primary Cardiologist: New -Dr. Duke Salvia Patient Profile:65 year old male without prior cardiac history who presented to the ED with progressive dyspnea and fatigue and was found to be in rapid atrial fibrillation.   SUBJECTIVE: Feels well, denies palpitations and chest pain.    OBJECTIVE Filed Vitals:   09/16/15 2111 09/16/15 2347 09/17/15 0123 09/17/15 0408  BP: 116/82 110/75  119/73  Pulse: 93 82  95  Temp:  97.5 F (36.4 C)  98 F (36.7 C)  TempSrc:  Oral  Oral  Resp:  22  24  Height:      Weight:    193 lb 11.2 oz (87.862 kg)  SpO2:  94% 95% 91%    Intake/Output Summary (Last 24 hours) at 09/17/15 0828 Last data filed at 09/17/15 0600  Gross per 24 hour  Intake    729 ml  Output   1170 ml  Net   -441 ml   Filed Weights   09/15/15 1551 09/16/15 0405 09/17/15 0408  Weight: 193 lb 4.8 oz (87.68 kg) 191 lb 9.6 oz (86.909 kg) 193 lb 11.2 oz (87.862 kg)    PHYSICAL EXAM General: Well developed, well nourished, male in no acute distress. Head: Normocephalic, atraumatic.  Neck: Supple without bruits, jaw JVD. Lungs:  Resp regular and unlabored, scattered wheezing Heart: Irrg. rhythm, S1, S2, no S3, S4, no murmur; no rub. Abdomen: Soft, non-tender, non-distended, BS + x 4.  Extremities: No clubbing, cyanosis, No edema.  Neuro: Alert and oriented X 3. Moves all extremities spontaneously. Psych: Normal affect.  LABS: CBC: Recent Labs  09/15/15 1109 09/16/15 0218 09/17/15 0415  WBC 11.4* 8.7 19.9*  NEUTROABS 9.0*  --   --   HGB 13.5 12.7* 13.3  HCT 40.6 38.7* 40.4  MCV 88.1 87.6 88.4  PLT 236 243 259   INR: Recent Labs  09/15/15 1109  INR 1.41   Basic Metabolic Panel: Recent Labs  09/15/15 1558 09/16/15 0218  09/17/15 0415  NA  --  126* 128*  K  --  3.7 4.0  CL  --  95* 93*  CO2  --  18* 20*  GLUCOSE  --  181* 135*  BUN  --  30* 53*  CREATININE  --  1.37* 1.66*  CALCIUM  --  8.7* 8.6*  MG 2.1  --   --    Liver Function Tests: Recent Labs  09/16/15 0218 09/17/15 0415  AST 118* 489*  ALT 181* 668*  ALKPHOS 79 86  BILITOT 1.3* 1.4*  PROT 5.9* 5.9*  ALBUMIN 3.5 3.7   Cardiac Enzymes: Recent Labs  09/15/15 1558 09/15/15 2016 09/16/15 0205  TROPONINI 0.08* 0.07* 0.07*   BNP:  B NATRIURETIC PEPTIDE  Date/Time Value Ref Range Status  09/15/2015 11:09 AM 919.3* 0.0 - 100.0 pg/mL Final   Hemoglobin A1C: Recent Labs  09/15/15 1558  HGBA1C 5.9*   Fasting Lipid Panel: Recent Labs  09/16/15 0218  CHOL 131  HDL 24*  LDLCALC 93  TRIG 71  CHOLHDL 5.5   Thyroid Function Tests: Recent Labs  09/15/15 1558  TSH 1.387     Current facility-administered medications:  .  acetaminophen (TYLENOL) tablet 650 mg, 650 mg, Oral, Q6H PRN **OR** acetaminophen (TYLENOL) suppository 650 mg, 650 mg, Rectal, Q6H PRN, Rushil  Terrilee Croak, MD .  aspirin chewable tablet 81 mg, 81 mg, Oral, Daily, Beather Arbour, MD, 81 mg at 09/17/15 0825 .  benzonatate (TESSALON) capsule 200 mg, 200 mg, Oral, TID PRN, Selina Cooley, MD, 200 mg at 09/16/15 2125 .  diltiazem (CARDIZEM) 100 mg in dextrose 5 % 100 mL (1 mg/mL) infusion, 5-15 mg/hr, Intravenous, Titrated, Selina Cooley, MD, Stopped at 09/17/15 0506 .  furosemide (LASIX) injection 40 mg, 40 mg, Intravenous, BID, Darreld Mclean, MD, 40 mg at 09/17/15 0824 .  heparin ADULT infusion 100 units/mL (25000 units/220mL sodium chloride 0.45%), 1,950 Units/hr, Intravenous, Continuous, Levert Feinstein, MD, Last Rate: 19.5 mL/hr at 09/17/15 0629, 1,950 Units/hr at 09/17/15 0629 .  ipratropium (ATROVENT) nebulizer solution 0.5 mg, 0.5 mg, Nebulization, Q6H, Marrian Salvage, MD, 0.5 mg at 09/17/15 0122 .  metoprolol tartrate (LOPRESSOR) tablet 12.5 mg, 12.5 mg, Oral,  BID, Chilton Si, MD, 12.5 mg at 09/17/15 0825 .  nicotine (NICODERM CQ - dosed in mg/24 hours) patch 21 mg, 21 mg, Transdermal, Daily, Levert Feinstein, MD, 21 mg at 09/16/15 1052 . diltiazem (CARDIZEM) infusion Stopped (09/17/15 0506)  . heparin 1,950 Units/hr (09/17/15 0629)    TELE:  Afib, rates remain around 100.     ECG: Afib  Radiology/Studies: Dg Chest 2 View  09/15/2015  CLINICAL DATA:  Acute onset shortness of breath this morning. Atrial fibrillation with rapid ventricular response. EXAM: CHEST  2 VIEW COMPARISON:  09/15/2015 FINDINGS: Cardiomegaly remains stable. Bibasilar predominant interstitial prominence has decreased since previous study, consistent with decreased interstitial edema. No evidence of pulmonary consolidation. Tiny bilateral pleural effusions noted. IMPRESSION: Decreased bibasilar interstitial prominence, consistent with decreased interstitial edema. Tiny bilateral pleural effusions. Stable cardiomegaly. Electronically Signed   By: Myles Rosenthal M.D.   On: 09/15/2015 15:31   Ct Angio Chest Pe W/cm &/or Wo Cm  09/16/2015  CLINICAL DATA:  Shortness of breath, atrial fibrillation EXAM: CT ANGIOGRAPHY CHEST WITH CONTRAST TECHNIQUE: Multidetector CT imaging of the chest was performed using the standard protocol during bolus administration of intravenous contrast. Multiplanar CT image reconstructions and MIPs were obtained to evaluate the vascular anatomy. CONTRAST:  90 cc Isovue COMPARISON:  None. FINDINGS: Mediastinum/Lymph Nodes: Central airways are patent. Images of the thoracic inlet are unremarkable. A precarinal lymph node measures 1.2 by 1.8 cm. Sub- carinal lymph node measures 1.7 by 1.9 cm. Right hilar lymph node measures 1.3 by 1.4 cm. Left hilar lymph node measures 1.6 by 1.3 cm. A left anterior mediastinal lymph node just lateral to aorta measures 1.4 x 1.6 cm. The study is of excellent technical quality. There is no evidence of pulmonary embolus.  Atherosclerotic calcifications of thoracic aorta are noted. No aortic aneurysm. Lungs/Pleura: There is small right pleural effusion. Trace left pleural effusion. Extensive bronchitic changes are noted in right lower lobe. Mild perihilar bronchitic changes are noted bilaterally. There is linear atelectasis or early infiltrate in right lower lobe laterally please see axial image 83. No pulmonary edema. Upper abdomen: The visualized upper abdomen shows no adrenal gland mass. Trace perihepatic ascites. No intrahepatic biliary ductal dilatation. Musculoskeletal: Sagittal images of the spine shows mild degenerative changes thoracic spine. Sagittal view of the sternum is unremarkable. No destructive rib lesions are noted. Review of the MIP images confirms the above findings. IMPRESSION: 1. No pulmonary embolus is noted. 2. Mild mediastinal and bilateral hilar adenopathy. 3. There is small right pleural effusion. Trace left pleural effusion. Significant bronchitic changes are noted in right lower lobe.  Mild bronchitic changes are noted bilateral perihilar. There is linear atelectasis or early infiltrate right lower lobe anterolaterally. 4. Mild degenerative changes thoracic spine. Electronically Signed   By: Natasha Mead M.D.   On: 09/16/2015 15:38   Dg Chest Port 1 View  09/15/2015  CLINICAL DATA:  65 year old male with intermittent shortness of breath for 2 weeks. Labored respiration on presentation. Initial encounter. EXAM: PORTABLE CHEST 1 VIEW COMPARISON:  None. FINDINGS: Portable AP semi upright view at 1102 hours. Cardiomegaly. Other mediastinal contours are within normal limits. Visualized tracheal air column is within normal limits. Mildly increased pulmonary interstitial markings, more apparent at the lung bases. No superimposed pneumothorax, pleural effusion or consolidation. IMPRESSION: Cardiomegaly with mild basilar predominant increased interstitial markings. Differential considerations include chronic  pulmonary changes, mild or developing interstitial edema, viral or atypical respiratory infection. Electronically Signed   By: Odessa Fleming M.D.   On: 09/15/2015 11:16     Current Medications:  . aspirin  81 mg Oral Daily  . furosemide  40 mg Intravenous BID  . ipratropium  0.5 mg Nebulization Q6H  . metoprolol tartrate  12.5 mg Oral BID  . nicotine  21 mg Transdermal Daily   . diltiazem (CARDIZEM) infusion Stopped (09/17/15 0506)  . heparin 1,950 Units/hr (09/17/15 0629)    ASSESSMENT AND PLAN: Principal Problem:   Atrial fibrillation with rapid ventricular response (HCC) Active Problems:   Tobacco abuse   Acute combined systolic and diastolic heart failure (HCC)  1. New onset atrial fibrillation with rapid ventricular response: Patient has been experiencing generalized malaise and progressive dyspnea over the past 4-6 weeks in the setting of GI illness approximately 5-6 weeks ago followed by pneumonia approximately 2 weeks ago. At no point did he experience palpitations or chest discomfort. He says he was seen by a provider approximately 2 weeks ago at the time of the pneumonia diagnosis. He was not advised that he was tachycardic at that time. When seen by urgent care on 09/15/15, he was in atrial fibrillation at a rate of 148 and was transported to [facility]. He was started on diltiazem gtt, which was stopped early this am for low BP. Nursing reports also that he had HR's in the 40's but I cannot find this on his telemetry. Currently his rates are still above 100 and he just received his oral metoprolol. BP is stable at 100-110's/70-80.   He is on heparin gtt for now, will continue and transition to NOAC after cath.   2. Acute combined systolic and diastolic BEE:FEOFHQR and ex-wife report that he has been having labored breathing and dyspnea on exertion over at least the past 6 months. He has never had chest pain. Echo shows reduced EF of 15% with severe diffuse hypokinesis. He needs  diagnostic heart cath, however he is still volume overloaded. We will continue diuresis and cath when euvolemic. Continue 40mg  IV Lasix BID today. Volume overloaded on exam with jaw JVD.   3. Tobacco abuse: Complete cessation advised. After initially saying he was only smoking for the past 10 years, he admitted that he has been smoking just about all of his adult life.  4. Hyponatremia: Discussed slightly worse with diuresis. Follow.   Signed, Little Ishikawa , NP 8:28 AM 09/17/2015 Pager [phone]

## 2015-09-17 NOTE — Progress Notes (Signed)
ANTICOAGULATION CONSULT NOTE - Follow Up Consult  Pharmacy Consult for heparin Indication: atrial fibrillation  Allergies  Allergen Reactions  . Codeine Itching    whelps  . Other     Seasonal allergies, "all spices, salt, pepper"    Patient Measurements: Height: 6' (182.9 cm) Weight: 193 lb 11.2 oz (87.862 kg) IBW/kg (Calculated) : 77.6 Heparin Dosing Weight: 87 kg  Vital Signs: Temp: 97.7 F (36.5 C) (06/09 1202) Temp Source: Axillary (06/09 1202) BP: 115/82 mmHg (06/09 1202) Pulse Rate: 116 (06/09 1202)  Labs:  Recent Labs  09/15/15 1109 09/15/15 1558  09/15/15 2016 09/16/15 0205 09/16/15 0218  09/16/15 1649 09/17/15 0415 09/17/15 0416 09/17/15 1355  HGB 13.5  --   --   --   --  12.7*  --   --  13.3  --   --   HCT 40.6  --   --   --   --  38.7*  --   --  40.4  --   --   PLT 236  --   --   --   --  243  --   --  259  --   --   LABPROT 17.4*  --   --   --   --   --   --   --   --   --   --   INR 1.41  --   --   --   --   --   --   --   --   --   --   HEPARINUNFRC  --   --   < >  --   --   --   < > 0.33 0.19*  --  0.41  CREATININE 0.96  --   --   --   --  1.37*  --   --  1.66*  --   --   CKTOTAL  --   --   --   --   --   --   --   --   --  770*  --   CKMB  --   --   --   --   --   --   --   --   --  32.9*  --   TROPONINI 0.07* 0.08*  --  0.07* 0.07*  --   --   --   --   --   --   < > = values in this interval not displayed.  Estimated Creatinine Clearance: 49.3 mL/min (by C-G formula based on Cr of 1.66).  Medications:  See medical record  Assessment: 62 yoM with acute onset of Afib, pharmacy consulted to dose heparin. Pt not previously on anticoagulation. CBC stable. No bleeding reported.  Heparin level is therapeutic this afternoon at 0.41 on 1950 units/hr  Goal of Therapy:  Heparin level 0.3-0.7 units/ml Monitor platelets by anticoagulation protocol: Yes   Plan:  Continue heparin infusion at 1950 units/hr Check confirmatory level in 8  hours Continue to monitor H&H and platelets daily   Thank you for allowing [name] to participate in this patients care. Signe Colt, PharmD Pager: [phone]  09/17/2015,3:06 PM

## 2015-09-17 NOTE — Progress Notes (Signed)
ANTICOAGULATION CONSULT NOTE - Follow Up Consult  Pharmacy Consult for heparin Indication: atrial fibrillation  Allergies  Allergen Reactions  . Codeine Itching    whelps  . Other     Seasonal allergies, "all spices, salt, pepper"    Patient Measurements: Height: 6' (182.9 cm) Weight: 193 lb 11.2 oz (87.862 kg) IBW/kg (Calculated) : 77.6 Heparin Dosing Weight: 87 kg  Vital Signs: Temp: 97.4 F (36.3 C) (06/09 2000) Temp Source: Oral (06/09 1653) BP: 110/90 mmHg (06/09 2155) Pulse Rate: 97 (06/09 2200)  Labs:  Recent Labs  09/15/15 1109 09/15/15 1558  09/15/15 2016 09/16/15 0205 09/16/15 0218  09/17/15 0415 09/17/15 0416 09/17/15 1355 09/17/15 2211  HGB 13.5  --   --   --   --  12.7*  --  13.3  --   --   --   HCT 40.6  --   --   --   --  38.7*  --  40.4  --   --   --   PLT 236  --   --   --   --  243  --  259  --   --   --   LABPROT 17.4*  --   --   --   --   --   --   --   --   --   --   INR 1.41  --   --   --   --   --   --   --   --   --   --   HEPARINUNFRC  --   --   < >  --   --   --   < > 0.19*  --  0.41 0.35  CREATININE 0.96  --   --   --   --  1.37*  --  1.66*  --   --   --   CKTOTAL  --   --   --   --   --   --   --   --  770*  --   --   CKMB  --   --   --   --   --   --   --   --  32.9*  --   --   TROPONINI 0.07* 0.08*  --  0.07* 0.07*  --   --   --   --   --   --   < > = values in this interval not displayed.  Estimated Creatinine Clearance: 49.3 mL/min (by C-G formula based on Cr of 1.66).  Medications:  See medical record  Assessment: 19 yoM with acute onset of Afib, pharmacy consulted to dose heparin. Pt not previously on anticoagulation. CBC stable. No bleeding reported.  Confirmatory heparin level is therapeutic tonight at 0.35 on 1950 units/hr   Goal of Therapy:  Heparin level 0.3-0.7 units/ml Monitor platelets by anticoagulation protocol: Yes   Plan:  1. Continue heparin infusion at 1950 units/hr 2. Daily HL and CBC 3. Continue to  monitor H&H and platelets daily  Thank you for allowing [name] to participate in this patients care.  Pollyann Samples, PharmD, BCPS 09/17/2015, 10:52 PM Pager: [phone]

## 2015-09-17 NOTE — Progress Notes (Signed)
ANTICOAGULATION CONSULT NOTE Pharmacy Consult for heparin Indication: atrial fibrillation  Allergies  Allergen Reactions  . Codeine Itching    whelps  . Other     Seasonal allergies, "all spices, salt, pepper"    Patient Measurements: Height: 6' (182.9 cm) Weight: 193 lb 11.2 oz (87.862 kg) IBW/kg (Calculated) : 77.6 Heparin Dosing Weight: 87 kg  Vital Signs: Temp: 98 F (36.7 C) (06/09 0408) Temp Source: Oral (06/09 0408) BP: 119/73 mmHg (06/09 0408) Pulse Rate: 95 (06/09 0408)  Labs:  Recent Labs  09/15/15 1109 09/15/15 1558  09/15/15 2016 09/16/15 0205 09/16/15 0218  09/16/15 1607 09/16/15 1649 09/17/15 0415  HGB 13.5  --   --   --   --  12.7*  --   --   --  13.3  HCT 40.6  --   --   --   --  38.7*  --   --   --  40.4  PLT 236  --   --   --   --  243  --   --   --  259  LABPROT 17.4*  --   --   --   --   --   --   --   --   --   INR 1.41  --   --   --   --   --   --   --   --   --   HEPARINUNFRC  --   --   < >  --   --   --   < > 0.54 0.33 0.19*  CREATININE 0.96  --   --   --   --  1.37*  --   --   --  1.66*  TROPONINI 0.07* 0.08*  --  0.07* 0.07*  --   --   --   --   --   < > = values in this interval not displayed.  Estimated Creatinine Clearance: 49.3 mL/min (by C-G formula based on Cr of 1.66).  Assessment: 65 yo Male with Afib, awaiting cath, for heparin  Goal of Therapy:  Heparin level 0.3-0.7 units/ml Monitor platelets by anticoagulation protocol: Yes   Plan:  Increase Heparin 1950 units/hr Check heparin level in 8 hours.   Geannie Risen, PharmD, BCPS    09/17/2015,6:13 AM

## 2015-09-17 NOTE — Discharge Summary (Signed)
Name: Reginald Hahn MRN: [id number] DOB: March 11, 1951 65 y.o. PCP: No primary care provider on file.  Date of Admission: 09/15/2015 10:07 AM Date of Discharge: 09/28/2015 Attending Physician: Dr. Earl Lagos  Discharge Diagnosis: Principal Problem:   Acute combined systolic and diastolic heart failure (HCC) Active Problems:   Atrial fibrillation with rapid ventricular response (HCC)   Tobacco abuse   Transaminitis   AKI (acute kidney injury) (HCC)   Atrial fibrillation (HCC)   Cardiogenic shock (HCC)   Retroperitoneal bleed   Hemorrhagic shock  Discharge Medications:   Medication List    STOP taking these medications        ADVIL 200 MG tablet  Generic drug:  ibuprofen      TAKE these medications        amiodarone 200 MG tablet  Commonly known as:  PACERONE  Take 1 tablet (200 mg total) by mouth 2 (two) times daily.     digoxin 0.25 MG tablet  Commonly known as:  LANOXIN  Take 1 tablet (0.25 mg total) by mouth daily.     furosemide 40 MG tablet  Commonly known as:  LASIX  Take 1 tablet (40 mg total) by mouth daily.     losartan 25 MG tablet  Commonly known as:  COZAAR  Take 1 tablet (25 mg total) by mouth 2 (two) times daily.     nicotine 21 mg/24hr patch  Commonly known as:  NICODERM CQ - dosed in mg/24 hours  Place 1 patch (21 mg total) onto the skin daily.     potassium chloride SA 20 MEQ tablet  Commonly known as:  K-DUR,KLOR-CON  Take 1 tablet (20 mEq total) by mouth daily.     spironolactone 25 MG tablet  Commonly known as:  ALDACTONE  Take 1 tablet (25 mg total) by mouth daily.        Disposition and follow-up:   Reginald Hahn was discharged from [facility] in Stable condition.     Follow-up Appointments: Follow-up Information    Follow up with Laban Emperor, MD. Go on 09/30/2015.   Specialty:  Internal Medicine   Why:  At 8:15AM for hospital follow-up   Contact information:   [address] [place]  [address] [phone]       Follow up with Arvilla Meres, MD On 10/14/2015.   Specialty:  Cardiology   Why:  at 3:00 Garage Code [id number]    Contact information:   [address] [place] [address] [phone]       Discharge Instructions: Discharge Instructions    (HEART FAILURE PATIENTS) Call MD:  Anytime you have any of the following symptoms: 1) 3 pound weight gain in 24 hours or 5 pounds in 1 week 2) shortness of breath, with or without a dry hacking cough 3) swelling in the hands, feet or stomach 4) if you have to sleep on extra pillows at night in order to breathe.    Complete by:  As directed      Call MD for:  difficulty breathing, headache or visual disturbances    Complete by:  As directed      Call MD for:  extreme fatigue    Complete by:  As directed      Call MD for:  persistant dizziness or light-headedness    Complete by:  As directed      Call MD for:  persistant nausea and vomiting    Complete by:  As directed      Call MD for:  severe uncontrolled pain    Complete by:  As directed      Call MD for:  temperature >100.4    Complete by:  As directed      Diet - low sodium heart healthy    Complete by:  As directed      Increase activity slowly    Complete by:  As directed            Consultations: Treatment Team:  Rounding Lbcardiology, MD  Procedures Performed:  Ct Abdomen Pelvis Wo Contrast  09/27/2015  CLINICAL DATA:  65 year old male with a history of retroperitoneal hemorrhage EXAM: CT ABDOMEN AND PELVIS WITHOUT CONTRAST TECHNIQUE: Multidetector CT imaging of the abdomen and pelvis was performed following the standard protocol without IV contrast. COMPARISON:  CT 09/23/2015, 09/21/2015, 09/16/2015 FINDINGS: Lower chest: Unremarkable appearance of the soft tissues of the chest wall. Heart size within normal limits.  No pericardial fluid/thickening. Differential attenuation of the blood pool and myocardium, suggesting anemia. No  lower mediastinal adenopathy. Unremarkable appearance of the distal esophagus. No hiatal hernia. Nodular opacities of the right greater than left bases. Small right and trace left pleural effusions. Abdomen/pelvis: Small hypodense focus of the right liver lobe, segment 6 measuring 10 mm, incompletely characterized though most likely a benign biliary cysts. Second focus at the liver dome is smaller measuring 5 mm. Again this lesion is incompletely characterized the most likely a benign cyst. Each of these were present on the comparison CT. Unremarkable appearance of spleen. Unremarkable appearance of bilateral adrenal glands. Unremarkable appearance of gallbladder and pancreas. No abnormally dilated small bowel or colon. No transition point. No inflammatory changes of the mesenteries. Loop of small bowel within left inguinal hernia. No evidence of obstruction. Right Kidney/Ureter: Right kidney is uplifted by retroperitoneal hemorrhage, unchanged from prior. No hydronephrosis. No nephrolithiasis. Left Kidney/Ureter: No hydronephrosis. No nephrolithiasis. No perinephric stranding. Unremarkable course of the left ureter. Unremarkable appearance of the urinary bladder. Re- demonstration of retroperitoneal hemorrhage, with the largest component measuring 15 cm in greatest transverse diameter. This measures smaller than the comparison study of 16.2 cm. Hemorrhage appears no larger with no greater extension on the current. There is hemorrhage both anterior posterior to the right psoas muscle, with hemorrhage tracking into the iliacus muscle and along the oblique musculature of the right abdominal wall. Expansion of the right psoas compared to the left. Hemorrhage extends towards the right inguinal region and into the lateral aspect of the right pelvis. No aneurysm. Atherosclerotic calcifications present of the aorta and bilateral iliofemoral system. Musculoskeletal: No displaced fracture identified. Degenerative changes of  the visualized spine. No significant bony canal narrowing. Degenerative changes of the bilateral hips. IMPRESSION: Noncontrast CT again demonstrates retroperitoneal hemorrhage, with no evidence of enlarging or ongoing hemorrhage. The distribution of hemorrhage anterior and posterior to the right psoas muscle, as well as the asymmetric expansion of the right psoas compared to the left suggests hemorrhage originated within the psoas muscle. Short-segment of small bowel within the left inguinal hernia without evidence of obstruction. This was present on the comparison CT studies and is unchanged. Scarring/atelectasis at the bilateral lung bases with a small right and trace left pleural effusion. Atherosclerosis. Signed, Yvone Neu. Loreta Ave, DO [facility] Radiology Electronically Signed   By: Gilmer Mor D.O.   On: 09/27/2015 13:11   Ct Abdomen Pelvis Wo Contrast  09/23/2015  CLINICAL DATA:  Followup retroperitoneal  hematoma. Previously on heparin drip for atrial fibrillation. EXAM: CT ABDOMEN AND PELVIS WITHOUT CONTRAST TECHNIQUE: Multidetector CT imaging of the abdomen and pelvis was performed following the standard protocol without IV contrast. COMPARISON:  Two days ago FINDINGS: Lower chest and abdominal wall: Small right pleural effusion with right basilar atelectasis. The pleural fluid is low-density. Bilateral inguinal hernia, containing nonobstructed sigmoid colon on the left. Hepatobiliary: No focal liver abnormality.No evidence of biliary obstruction or stone. Pancreas: Unremarkable. Spleen: Unremarkable. Adrenals/Urinary Tract: Negative adrenals. Anteriorly displaced right kidney due to posterior para renal hematoma. No subcapsular hematoma or prominent accumulation around the hilar vessels to suggest renal cause. Presumed small upper pole cyst on the right and lower pole on the left. No hydronephrosis. Bladder is decompressed by a Foley catheter.  Reproductive:Negative. Stomach/Bowel: No obstruction or bowel wall thickening. Uncomplicated duodenal diverticulum. Vascular/Lymphatic: Unchanged extent and shape of a large retroperitoneal hematoma on the right mainly accumulated in the posterior pararenal and preaortic retroperitoneum. The hemorrhage extends from the diaphragm into the right pelvis and measures up to 14 cm at the level of the pelvic inlet. No noted continuity with the aorta, which is non aneurysmal. As above, there is no subcapsular renal component or accumulation around the renal hilar vessels. Peritoneal: Small low-density ascites. Musculoskeletal: Degenerative changes without acute finding. IMPRESSION: 1. Unchanged large retroperitoneal hematoma on the right. 2. Increased but small right pleural effusion with right lower lobe atelectasis. 3. Chronic findings are stable and described above. Electronically Signed   By: Marnee Spring M.D.   On: 09/23/2015 07:46   Ct Abdomen Pelvis Wo Contrast  09/21/2015  CLINICAL DATA:  Hypotension. Worsening right lower quadrant and right groin pain. Decreased hemoglobin since this morning. On heparin. EXAM: CT ABDOMEN AND PELVIS WITHOUT CONTRAST TECHNIQUE: Multidetector CT imaging of the abdomen and pelvis was performed following the standard protocol without IV contrast. COMPARISON:  CT chest 09/16/2015 FINDINGS: Focal atelectasis or consolidation in the right lung base. Mild dependent changes in the left lung base. Small right pleural effusion. Pneumonia not excluded. There is a large retroperitoneal hematoma extending from the right upper quadrant involving the para renal space and extending throughout the right retroperitoneum to the iliopsoas region and along the right pelvis down into the groin region. At its largest extent, the hematoma measures about 8.8 x 17.6 cm. The hematoma displaces the right kidney anteriorly and displaces the IVC towards the left. The aorta is also displaced towards the left  and appears grossly intact. Aortic calcifications are present. No aneurysm. Sub cm low-attenuation lesions are present in the liver. These are too small to characterize but likely represent small cysts. Spleen is small in size. The unenhanced appearance of the gallbladder, pancreas, adrenal glands, kidneys, and retroperitoneal lymph nodes is unremarkable. Stomach, small bowel, and colon are not abnormally distended. No free air in the abdomen. Pelvis: Bladder is decompressed with a Foley catheter. Bilateral inguinal hernias, right containing fat and hematoma and left containing sigmoid colon. No evidence of proximal obstruction. Prostate gland is enlarged, measuring 5.4 cm diameter. Degenerative changes in the spine. IMPRESSION: Very large retroperitoneal hematoma extending throughout the right abdomen and pelvis and displaced adjacent organs. Source is not identified. No aortic aneurysm. Prostate gland is enlarged. Bilateral inguinal hernias, left containing sigmoid colon without proximal obstruction. Consolidation in the right lung base with small right pleural effusion. Possible pneumonia. These results will be called to the ordering clinician or representative by the Radiologist Assistant, and communication documented in the PACS  or zVision Dashboard. Electronically Signed   By: Burman Nieves M.D.   On: 09/21/2015 21:18   Dg Chest 2 View  09/15/2015  CLINICAL DATA:  Acute onset shortness of breath this morning. Atrial fibrillation with rapid ventricular response. EXAM: CHEST  2 VIEW COMPARISON:  09/15/2015 FINDINGS: Cardiomegaly remains stable. Bibasilar predominant interstitial prominence has decreased since previous study, consistent with decreased interstitial edema. No evidence of pulmonary consolidation. Tiny bilateral pleural effusions noted. IMPRESSION: Decreased bibasilar interstitial prominence, consistent with decreased interstitial edema. Tiny bilateral pleural effusions. Stable cardiomegaly.  Electronically Signed   By: Myles Rosenthal M.D.   On: 09/15/2015 15:31   Ct Angio Chest Pe W/cm &/or Wo Cm  09/16/2015  CLINICAL DATA:  Shortness of breath, atrial fibrillation EXAM: CT ANGIOGRAPHY CHEST WITH CONTRAST TECHNIQUE: Multidetector CT imaging of the chest was performed using the standard protocol during bolus administration of intravenous contrast. Multiplanar CT image reconstructions and MIPs were obtained to evaluate the vascular anatomy. CONTRAST:  90 cc Isovue COMPARISON:  None. FINDINGS: Mediastinum/Lymph Nodes: Central airways are patent. Images of the thoracic inlet are unremarkable. A precarinal lymph node measures 1.2 by 1.8 cm. Sub- carinal lymph node measures 1.7 by 1.9 cm. Right hilar lymph node measures 1.3 by 1.4 cm. Left hilar lymph node measures 1.6 by 1.3 cm. A left anterior mediastinal lymph node just lateral to aorta measures 1.4 x 1.6 cm. The study is of excellent technical quality. There is no evidence of pulmonary embolus. Atherosclerotic calcifications of thoracic aorta are noted. No aortic aneurysm. Lungs/Pleura: There is small right pleural effusion. Trace left pleural effusion. Extensive bronchitic changes are noted in right lower lobe. Mild perihilar bronchitic changes are noted bilaterally. There is linear atelectasis or early infiltrate in right lower lobe laterally please see axial image 83. No pulmonary edema. Upper abdomen: The visualized upper abdomen shows no adrenal gland mass. Trace perihepatic ascites. No intrahepatic biliary ductal dilatation. Musculoskeletal: Sagittal images of the spine shows mild degenerative changes thoracic spine. Sagittal view of the sternum is unremarkable. No destructive rib lesions are noted. Review of the MIP images confirms the above findings. IMPRESSION: 1. No pulmonary embolus is noted. 2. Mild mediastinal and bilateral hilar adenopathy. 3. There is small right pleural effusion. Trace left pleural effusion. Significant bronchitic changes  are noted in right lower lobe. Mild bronchitic changes are noted bilateral perihilar. There is linear atelectasis or early infiltrate right lower lobe anterolaterally. 4. Mild degenerative changes thoracic spine. Electronically Signed   By: Natasha Mead M.D.   On: 09/16/2015 15:38   Nm Myocar Multi W/spect W/wall Motion / Ef  09/27/2015   There was no ST segment deviation noted during stress.  The left ventricular ejection fraction is moderately decreased (30-44%).  Findings consistent with prior myocardial infarction.  This is an intermediate risk study.  nferior/inferolateral defect (moderate) that does not improve in the rest images consistent with scar and possible soft tissue attenuation No significant ischemia.    Dg Chest Port 1 View  09/18/2015  CLINICAL DATA:  Right PICC line placement EXAM: PORTABLE CHEST 1 VIEW COMPARISON:  09/15/2015 FINDINGS: Cardiomediastinal silhouette is stable. There is streaky atelectasis or infiltrate right basilar and small right pleural effusion. Right arm PICC line with tip in SVC right atrium junction. No pneumothorax. Bronchitic changes right lower lobe. IMPRESSION: Streaky atelectasis or infiltrate right base and small right pleural effusion. Right arm PICC line with tip in SVC right atrium junction. No pneumothorax. Bronchitic changes right lower lobe.  Electronically Signed   By: Natasha Mead M.D.   On: 09/18/2015 12:39   Dg Chest Port 1 View  09/15/2015  CLINICAL DATA:  65 year old male with intermittent shortness of breath for 2 weeks. Labored respiration on presentation. Initial encounter. EXAM: PORTABLE CHEST 1 VIEW COMPARISON:  None. FINDINGS: Portable AP semi upright view at 1102 hours. Cardiomegaly. Other mediastinal contours are within normal limits. Visualized tracheal air column is within normal limits. Mildly increased pulmonary interstitial markings, more apparent at the lung bases. No superimposed pneumothorax, pleural effusion or consolidation.  IMPRESSION: Cardiomegaly with mild basilar predominant increased interstitial markings. Differential considerations include chronic pulmonary changes, mild or developing interstitial edema, viral or atypical respiratory infection. Electronically Signed   By: Odessa Fleming M.D.   On: 09/15/2015 11:16   Dg Abd Portable 1v  09/24/2015  CLINICAL DATA:  65 year old male with mid abdominal pain for several days. Large retroperitoneal hematoma detected on CT. Initial encounter. EXAM: PORTABLE ABDOMEN - 1 VIEW COMPARISON:  CT Abdomen and Pelvis 09/23/2015 and earlier. FINDINGS: Portable AP supine view at 1319 hours. Abundant gas in nondilated large bowel. Few word gas-filled small bowel loops also appear normal in caliber. Generalized increased density in the right abdomen appears to correspond to the epicenter of the retroperitoneal hematoma demonstrated by CT. No definite pneumoperitoneum on the view. The level of the diaphragm is not included. No acute osseous abnormality identified. IMPRESSION: Stable bowel gas pattern without evidence of mechanical bowel obstruction. Electronically Signed   By: Odessa Fleming M.D.   On: 09/24/2015 13:31    2D Echo:  09/16/15 - Left ventricle: The cavity size was normal. There was mild  concentric hypertrophy. Systolic function was normal. The  estimated ejection fraction was 15%. Severe diffuse hypokinesis  with regional variations. The study was not technically  sufficient to allow evaluation of LV diastolic dysfunction due to  atrial fibrillation. - Mitral valve: Calcified annulus. There was mild regurgitation. - Left atrium: The atrium was mildly dilated. - Right ventricle: The cavity size was moderately dilated. Wall  thickness was normal. Systolic function was moderately to  severely reduced. - Right atrium: The atrium was severely dilated. - Tricuspid valve: There was mild-moderate regurgitation. - Pulmonary arteries: PA peak pressure: 34 mm Hg (S).  09/26/15 -  Left ventricle: The cavity size was mildly dilated. Wall  thickness was increased in a pattern of mild LVH. The estimated  ejection fraction was 25%. Diffuse hypokinesis. - Left atrium: The atrium was mildly dilated. - Right atrium: The atrium was mildly dilated. - Atrial septum: No defect or patent foramen ovale was identified. - Impressions: Abnormal GLS - 10.3 RV less dilated than previous  Incomplete study no color flow done other than parasternal images   Admission HPI: Reginald Hahn is a 65 year old male with 10-pack year tobacco use who presented with two-week history of worsening dyspnea. About one month ago, he suffered a bout of viral gastroenteritis which consisted of diarrhea, nausea, vomiting. These symptoms lasted for 3 days and then he improved over the next 2 weeks. However over the last 2 weeks he has noted the progressive onset of worsening dyspnea to the point where he felt "forced inspiration." His symptoms are also associated with episodes of palpitations, dizziness to the point of feeling disorientated which was most pronounced yesterday when his vision became blurry. This morning he awoke and felt his symptoms were getting worse, so he went to urgent care who then referred him to the emergency department.  He otherwise denies any prior history of cardiac or pulmonary problems, chest pain, headache, numbness, tingling, abdominal pain, leg swelling, leg pain, calf tenderness though he does feel his legs have been sore with movement over the last several days. He owns a [employer]. He does not regularly follow with a doctor and only takes ibuprofen as needed for headaches.   In the ED, he is found to be in A. fib with heart rate in the 140s. He received ASA , Lasix  IV, Solumedrol  IV and was started on diltiazem IV.   Hospital Course by problem list: Principal Problem:   Acute combined systolic and diastolic heart failure (HCC) Active Problems:   Atrial  fibrillation with rapid ventricular response (HCC)   Tobacco abuse   Transaminitis   AKI (acute kidney injury) (HCC)   Atrial fibrillation (HCC)   Cardiogenic shock (HCC)   Retroperitoneal bleed   Hemorrhagic shock   New onset systolic and diastolic CHF: Likely 2/2 new-onset a-fib and viral cardiomyopathy. CTA negative for PE. ABI study reassuring for no limb ischemia despite pallor and cool extremities on admission. On initial rate control, he developed transaminitis and AKI consistent with cardiogenic shock and was transitioned to amiodarone, digoxin, spironolactone, and losartan with milrinone to assist diuresis though these medications were temporarily adjusted when he was transferred to the ICU for hemorrhagic shock [see below] though resumed when he was stable. Repeat echo on 6/18 showed EF 25% [see findings above]. Catheterization and cardioversion were deferred as he could not be anticoagulated. Myoview notable for intermediate risk study though thought to be artifact given soft tissue attenuation and otherwise reassuring  At discharge, he was still in atrial fibrillation and no oxygen requirement. -Confirm patient picked up new medications [see above] -Reassess weight: 72.2 kg at discharge -Recheck BMET -Ensure patient knows of follow-up with Dr. Kittie Plater and follow-up with [facility] [medication assistance] -Consider referral for baseline PFTs and eye exam if amiodarone is to be continued for long-term  Hemorrhagic shock 2/2 large right-sided spontaneous retroperitoneal hematoma: His course was complicated by hemorrhagic shock 2/2 spontaneous retroperitoneal hematoma on 09/21/15. Heparin was stopped. In the ICU, he required vasopressin, fluid resuscitation, 3u pRBC, and 4u FFP. Hb stabilized at 10-11 after he was transferred out through discharge. Serial CT imaging showed stable findings.  -Reassess pain and abdominal exam at follow -Recheck CBC  Hypotonic hyponatremia:  Likely 2/2 hypervolemic state. Na 128 on admission improved to 132 on discharge with medications noted above.  Tobacco abuse: Used patch  while inpatient and wishes to quit.  -Encourage cessation  Pre-diabetic: A1c 5.9% inpatient -Encourage lifestyle modification  Discharge Vitals:   BP 117/82 mmHg  Pulse 93  Temp(Src) 98.2 F (36.8 C) (Oral)  Resp 18  Ht 6' (1.829 m)  Wt 172 lb 4.8 oz (78.155 kg)  BMI 23.36 kg/m2  SpO2 94%  Discharge Labs:  No results found for this or any previous visit (from the past 24 hour(s)).  Signed: Beather Arbour, MD 09/30/2015, 6:40 AM    Services Ordered on Discharge: None Equipment Ordered on Discharge: None

## 2015-09-18 ENCOUNTER — Inpatient Hospital Stay (HOSPITAL_COMMUNITY): Payer: Self-pay

## 2015-09-18 DIAGNOSIS — I509 Heart failure, unspecified: Secondary | ICD-10-CM

## 2015-09-18 LAB — CBC
HCT: 39.6 % (ref 39.0–52.0)
Hemoglobin: 12.9 g/dL — ABNORMAL LOW (ref 13.0–17.0)
MCH: 29 pg (ref 26.0–34.0)
MCHC: 32.6 g/dL (ref 30.0–36.0)
MCV: 89 fL (ref 78.0–100.0)
PLATELETS: 219 10*3/uL (ref 150–400)
RBC: 4.45 MIL/uL (ref 4.22–5.81)
RDW: 14.5 % (ref 11.5–15.5)
WBC: 12.3 10*3/uL — AB (ref 4.0–10.5)

## 2015-09-18 LAB — COMPREHENSIVE METABOLIC PANEL
ALT: 527 U/L — AB (ref 17–63)
AST: 247 U/L — AB (ref 15–41)
Albumin: 3.4 g/dL — ABNORMAL LOW (ref 3.5–5.0)
Alkaline Phosphatase: 89 U/L (ref 38–126)
Anion gap: 9 (ref 5–15)
BUN: 39 mg/dL — AB (ref 6–20)
CHLORIDE: 95 mmol/L — AB (ref 101–111)
CO2: 27 mmol/L (ref 22–32)
Calcium: 8.3 mg/dL — ABNORMAL LOW (ref 8.9–10.3)
Creatinine, Ser: 1.17 mg/dL (ref 0.61–1.24)
Glucose, Bld: 108 mg/dL — ABNORMAL HIGH (ref 65–99)
POTASSIUM: 3.3 mmol/L — AB (ref 3.5–5.1)
SODIUM: 131 mmol/L — AB (ref 135–145)
Total Bilirubin: 1.2 mg/dL (ref 0.3–1.2)
Total Protein: 5.8 g/dL — ABNORMAL LOW (ref 6.5–8.1)

## 2015-09-18 LAB — HEPATITIS PANEL, ACUTE
HEP A IGM: NEGATIVE
HEP B C IGM: NEGATIVE
Hepatitis B Surface Ag: NEGATIVE

## 2015-09-18 LAB — HEPARIN LEVEL (UNFRACTIONATED)
HEPARIN UNFRACTIONATED: 0.5 [IU]/mL (ref 0.30–0.70)
Heparin Unfractionated: 0.22 IU/mL — ABNORMAL LOW (ref 0.30–0.70)
Heparin Unfractionated: 0.81 IU/mL — ABNORMAL HIGH (ref 0.30–0.70)

## 2015-09-18 LAB — CARBOXYHEMOGLOBIN
Carboxyhemoglobin: 1.1 % (ref 0.5–1.5)
Methemoglobin: 0.7 % (ref 0.0–1.5)
O2 SAT: 37.6 %
TOTAL HEMOGLOBIN: 11.8 g/dL — AB (ref 13.5–18.0)

## 2015-09-18 MED ORDER — AMIODARONE HCL IN DEXTROSE 360-4.14 MG/200ML-% IV SOLN
30.0000 mg/h | INTRAVENOUS | Status: DC
  Administered 2015-09-19 – 2015-09-24 (×9): 30 mg/h via INTRAVENOUS
  Filled 2015-09-18 (×10): qty 200

## 2015-09-18 MED ORDER — SPIRONOLACTONE 25 MG PO TABS
25.0000 mg | ORAL_TABLET | Freq: Every day | ORAL | Status: DC
  Administered 2015-09-18 – 2015-09-21 (×4): 25 mg via ORAL
  Filled 2015-09-18 (×4): qty 1

## 2015-09-18 MED ORDER — SODIUM CHLORIDE 0.9% FLUSH
10.0000 mL | Freq: Two times a day (BID) | INTRAVENOUS | Status: DC
  Administered 2015-09-18 – 2015-09-25 (×11): 10 mL

## 2015-09-18 MED ORDER — FUROSEMIDE 10 MG/ML IJ SOLN
60.0000 mg | Freq: Two times a day (BID) | INTRAMUSCULAR | Status: DC
  Administered 2015-09-18 – 2015-09-19 (×2): 60 mg via INTRAVENOUS
  Filled 2015-09-18 (×2): qty 6

## 2015-09-18 MED ORDER — AMIODARONE LOAD VIA INFUSION
150.0000 mg | Freq: Once | INTRAVENOUS | Status: DC
  Filled 2015-09-18: qty 83.34

## 2015-09-18 MED ORDER — AMIODARONE HCL IN DEXTROSE 360-4.14 MG/200ML-% IV SOLN
60.0000 mg/h | INTRAVENOUS | Status: AC
  Administered 2015-09-18: 60 mg/h via INTRAVENOUS
  Filled 2015-09-18: qty 200

## 2015-09-18 MED ORDER — DIGOXIN 125 MCG PO TABS
0.2500 mg | ORAL_TABLET | Freq: Every day | ORAL | Status: DC
  Administered 2015-09-18 – 2015-09-28 (×11): 0.25 mg via ORAL
  Filled 2015-09-18 (×3): qty 1
  Filled 2015-09-18: qty 2
  Filled 2015-09-18: qty 1
  Filled 2015-09-18: qty 2
  Filled 2015-09-18 (×3): qty 1
  Filled 2015-09-18 (×2): qty 2

## 2015-09-18 MED ORDER — MILRINONE LACTATE IN DEXTROSE 20-5 MG/100ML-% IV SOLN
0.2500 ug/kg/min | INTRAVENOUS | Status: DC
  Administered 2015-09-18 – 2015-09-20 (×3): 0.25 ug/kg/min via INTRAVENOUS
  Filled 2015-09-18 (×3): qty 100

## 2015-09-18 MED ORDER — IPRATROPIUM BROMIDE 0.02 % IN SOLN
0.5000 mg | Freq: Three times a day (TID) | RESPIRATORY_TRACT | Status: DC | PRN
  Administered 2015-09-18: 0.5 mg via RESPIRATORY_TRACT
  Filled 2015-09-18: qty 2.5

## 2015-09-18 MED ORDER — AMIODARONE LOAD VIA INFUSION
150.0000 mg | Freq: Once | INTRAVENOUS | Status: AC
  Administered 2015-09-18: 150 mg via INTRAVENOUS
  Filled 2015-09-18: qty 83.34

## 2015-09-18 MED ORDER — POTASSIUM CHLORIDE ER 10 MEQ PO TBCR
20.0000 meq | EXTENDED_RELEASE_TABLET | Freq: Two times a day (BID) | ORAL | Status: DC
  Administered 2015-09-18 – 2015-09-19 (×4): 20 meq via ORAL
  Filled 2015-09-18 (×9): qty 2

## 2015-09-18 MED ORDER — LOSARTAN POTASSIUM 25 MG PO TABS
12.5000 mg | ORAL_TABLET | Freq: Every day | ORAL | Status: DC
  Administered 2015-09-18 – 2015-09-19 (×2): 12.5 mg via ORAL
  Filled 2015-09-18 (×2): qty 1

## 2015-09-18 MED ORDER — SODIUM CHLORIDE 0.9% FLUSH
10.0000 mL | INTRAVENOUS | Status: DC | PRN
  Administered 2015-09-27: 40 mL
  Administered 2015-09-28: 10 mL
  Filled 2015-09-18 (×2): qty 40

## 2015-09-18 MED ORDER — POTASSIUM CHLORIDE CRYS ER 20 MEQ PO TBCR
40.0000 meq | EXTENDED_RELEASE_TABLET | Freq: Once | ORAL | Status: AC
  Administered 2015-09-18: 40 meq via ORAL
  Filled 2015-09-18: qty 2

## 2015-09-18 MED ORDER — IPRATROPIUM BROMIDE 0.02 % IN SOLN
0.5000 mg | Freq: Three times a day (TID) | RESPIRATORY_TRACT | Status: DC
  Administered 2015-09-18: 0.5 mg via RESPIRATORY_TRACT
  Filled 2015-09-18: qty 2.5

## 2015-09-18 NOTE — Progress Notes (Signed)
Subjective: Pt declined getting PICC and starting milrinone last night because he and his ex-wife were under the impression that milrinone could only be started in the ICU. Overall, they were confused with poor communication of the plan and concerned that what was happening (e.g. Starting milrinone IV initially) was not in line with the plan they understood. Complicated by not having ex-wife at bedside ON. Pt and ex-wife Marylu Lund) want all changes about meds, procedures, etc to be told to both of them, including to call ex-wife if she is not at bedside. They both feel ex-wife is very important in helping patient understand what's happening and why, both 2/2 his hearing impairment and understanding health information.   NAEON. Back on O2 but feels comfortable with breathing. Reports some dizziness. Pt feels his breathing is better with the atrovent treatments. Would like them to be less frequent to avoid getting woken up at night. Hesitant to make prn because Mr. Ingrum is bad about asking for nurse. Pt and ex-wife express understanding of the current plan to place PICC, start medications, and diurese today.  Objective: Vital signs in last 24 hours: Filed Vitals:   09/18/15 0500 09/18/15 0823 09/18/15 0827 09/18/15 1229  BP: 106/67 126/85    Pulse: 114 70    Temp: 97.8 F (36.6 C) 98.3 F (36.8 C)  98.2 F (36.8 C)  TempSrc:  Oral  Oral  Resp: 20     Height:      Weight: 85.911 kg (189 lb 6.4 oz)     SpO2: 94% 95% 95%    Weight change: -1.95 kg (-4 lb 4.8 oz)  Intake/Output Summary (Last 24 hours) at 09/18/15 1322 Last data filed at 09/18/15 0852  Gross per 24 hour  Intake 1310.3 ml  Output   1400 ml  Net  -89.7 ml   BP 104/83 mmHg  Pulse 93  Temp(Src) 98.2 F (36.8 C) (Oral)  Resp 26  Ht 6' (1.829 m)  Wt 85.911 kg (189 lb 6.4 oz)  BMI 25.68 kg/m2  SpO2 95%  General appearance: alert, cooperative, no distress and comfortable lying in bed talking, good color, comfortable  breathing. Supp O2 at 4L with N2 in nose. Eyes: No scleral icterus Lungs: No wheezing. Crackles in lower posterior fields bilaterally. Heart: irregularly irregular rhythm, no m/r/g; JVD to jawline at 30 degrees Extremities: No cyanosis. Feet warm and edematous. 1+ pretibial pitting edema in RLE and 2-3+ pitting edema in LLE. Palpable DP pulses bilaterally.    Lab Results:  Recent Labs Lab 09/16/15 0218 09/17/15 0415 09/18/15 0329  WBC 8.7 19.9* 12.3*  HGB 12.7* 13.3 12.9*  HCT 38.7* 40.4 39.6  PLT 243 259 219    Recent Labs Lab 09/16/15 0218 09/17/15 0415 09/17/15 0814 09/18/15 0329  NA 126* 128*  --  131*  K 3.7 4.0  --  3.3*  CL 95* 93*  --  95*  CO2 18* 20*  --  27  BUN 30* 53*  --  39*  CREATININE 1.37* 1.66*  --  1.17  CALCIUM 8.7* 8.6*  --  8.3*  PROT 5.9* 5.9*  --  5.8*  BILITOT 1.3* 1.4* 1.2 1.2  ALKPHOS 79 86  --  89  ALT 181* 668*  --  527*  AST 118* 489*  --  247*  GLUCOSE 181* 135*  --  108*    Medications: I have reviewed the patient's current medications. Scheduled Meds: . aspirin  81 mg Oral Daily  . digoxin  0.25 mg Oral Daily  . furosemide  60 mg Intravenous BID  . losartan  12.5 mg Oral Daily  . nicotine  21 mg Transdermal Daily  . potassium chloride  20 mEq Oral BID  . sodium chloride flush  10-40 mL Intracatheter Q12H  . spironolactone  25 mg Oral Daily   Continuous Infusions: . amiodarone 60 mg/hr (09/18/15 1305)   Followed by  . amiodarone    . heparin 2,100 Units/hr (09/18/15 0935)   PRN Meds:.benzonatate, ipratropium, sodium chloride flush Assessment/Plan: Principal Problem:   Acute combined systolic and diastolic heart failure (HCC) Active Problems:   Atrial fibrillation with rapid ventricular response (HCC)   Tobacco abuse   Transaminitis   AKI (acute kidney injury) (HCC)   Atrial fibrillation (HCC)   Cardiogenic shock [facility])  Mr. [name] is a 32yoM with a history of tobacco use and 2 weeks of progressive dyspnea was found  to be in atrial fibrillation with RVR on 6/7 and was found to have CHF with EF 15%.  #Atrial fibrillation Most likely 2/2 viral cardiomyopathy vs. lung disease. Work-up reassuring for PE, MI, infectious, thyroid disease, alcohol, and drugs.Found to have EF of 15%, severe RAD, and diffuse LV hypokinesis. Plan to diurese with BB rate control c/b AKI. Starting rhythm control, inotrope, and diuresis today with CVP monitoring via PICC placed 6/10 for more precise volume status data. Plan to cath after euvolemic (likely 6/12). Heart failure service to consider milrinone prn. - Continue telemetry - Start amiodarone loading, followed by 1.8 mg/mL infusion via PICC - Start digoxin 0.25mg  qdaily - Restart Lasix  IV BID  - Start losartan 12.5mg  qdaily and spironolactone  qdaily today. Will see if BP tolerates - Heparin until cath. Then convert to NOAC. - Cardioversion after 4 wks on NOAC. If symptomatic or HR hard to control inpatient, TEE and inpatient cardioversion. - Cards outpatient f/u - Echo in 3 months. May consider ICD  #Dyspnea, wheezing Dyspnea most likely 2/2 CHF. Comfortable on exam with bi-basilar crackles and improved wheezing. Avoid duonebs given tachycardia. Pt feels atrovent nebs helpful but would like less frequently - Ipratropium nebs q8h prn - Continue Tessalon pearls for cough. - Wean O2 as tolerated - Consider outpatient PFTs for COPD  #AKI Resolved to 1.17. Highest 1.66 yest likely 2/2 to dye load, Lasix, and heart failure. Careful with IVF as patient has EF 15% and is hypervolemic.  - Daily BMP.   #Hypotonic hyponatremia 131 today up from 126 at lower. Most likely 2/2 heart failure. No history of ingestion to explain borderline high osmolol gap of 11. - Daily BMP - If change in mental status, check Na  #Transaminitis Downtrending. At highest, was ALT and AST were 10-11x ULN. 2x ULN on admission. Bili nml, AP, and platelents normal. PT elevated 17.4 at admission.  Most likely 2/2 to congestive hepatopathy but DILI and infectious on differential. Acute hep panel negative.  - Daily LFTs - Consider CMV and EBV ab  #Leukocytosis 12.3 < 19.9 < 8.7 < 11 on admission. Elevated absolute neutrophils and monocytes. Likely 2/2 myocarditis vs. steroids received in ED. Afebrile. - Follow on CBC  #R foot pallor, no palpable pulses (found on doppler), and less brisk refill - resolved 6/8 Possible arterial thrombosis that resolved with heparin. ABI nml. No DVT on venous dopplers.   # Tobacco abuse x30 years. 1 ppd  - Patch  while inpatient.  #VTE Prophylaxis - Heparin gtt - Transition to NOAC after cath  #FEN/GI -  No IVF given hypervolemia. - Hypokalemia 3.3 today. Goal K 4 and Mg 2. - Daily CBC, BMP, Mg, and LFTs - Regular diet  #Dispo - Likely d/c to home.  - Had been seen by [facility] in past but doesn't necessarily want to follow with them. If wants to follow with [facility], consider I[facility].  This is a Psychologist, occupational Note.  The care of the patient was discussed with Dr. Heywood Iles and the assessment and plan formulated with their assistance.  Please see their attached note for official documentation of the daily encounter.   LOS: 3 days   Newton Pigg, Med Student 09/18/2015, 1:22 PM

## 2015-09-18 NOTE — Progress Notes (Signed)
ANTICOAGULATION CONSULT NOTE - Follow Up Consult  Pharmacy Consult for Heparin  Indication: atrial fibrillation  Allergies  Allergen Reactions  . Codeine Itching    whelps  . Other     Seasonal allergies, "all spices, salt, pepper"   Patient Measurements: Height: 6' (182.9 cm) Weight: 193 lb 11.2 oz (87.862 kg) IBW/kg (Calculated) : 77.6  Vital Signs: Temp: 97.4 F (36.3 C) (06/09 2000) Temp Source: Oral (06/09 1653) BP: 110/90 mmHg (06/09 2155) Pulse Rate: 55 (06/10 0230)  Labs:  Recent Labs  09/15/15 1109 09/15/15 1558  09/15/15 2016 09/16/15 0205 09/16/15 0218  09/17/15 0415 09/17/15 0416 09/17/15 1355 09/17/15 2211 09/18/15 0329  HGB 13.5  --   --   --   --  12.7*  --  13.3  --   --   --  12.9*  HCT 40.6  --   --   --   --  38.7*  --  40.4  --   --   --  39.6  PLT 236  --   --   --   --  243  --  259  --   --   --  219  LABPROT 17.4*  --   --   --   --   --   --   --   --   --   --   --   INR 1.41  --   --   --   --   --   --   --   --   --   --   --   HEPARINUNFRC  --   --   < >  --   --   --   < > 0.19*  --  0.41 0.35 0.22*  CREATININE 0.96  --   --   --   --  1.37*  --  1.66*  --   --   --  1.17  CKTOTAL  --   --   --   --   --   --   --   --  770*  --   --   --   CKMB  --   --   --   --   --   --   --   --  32.9*  --   --   --   TROPONINI 0.07* 0.08*  --  0.07* 0.07*  --   --   --   --   --   --   --   < > = values in this interval not displayed.  Estimated Creatinine Clearance: 70 mL/min (by C-G formula based on Cr of 1.17).  Assessment: Heparin for afib, HL this AM is low, possible LHC/RHC on monday  Goal of Therapy:  Heparin level 0.3-0.7 units/ml Monitor platelets by anticoagulation protocol: Yes   Plan:  -Increase heparin to 2100 units/hr -1300 HL  Kadin Bera 09/18/2015,4:38 AM

## 2015-09-18 NOTE — Progress Notes (Addendum)
Advanced Heart Failure Rounding Note   Subjective:    65 year old male without prior cardiac history who presented to the ED with progressive dyspnea and fatigue and was found to be in rapid atrial fibrillation after recent flu-like illness. EF found to be 15%  Refused PICC and milrinone overnight. Apparently tried to leave this am  Beginning to diurese on lasixx 40 iv bid. Renal function improving. LFTs down.   Remains in AF 120s  Says he feels great. Denies dyspnea. Although dyspneic on exam    Objective:   Weight Range:  Vital Signs:   Temp:  [97.4 F (36.3 C)-98.3 F (36.8 C)] 98.3 F (36.8 C) (06/10 0823) Pulse Rate:  [55-116] 70 (06/10 0823) Resp:  [18-20] 20 (06/10 0500) BP: (106-126)/(67-90) 126/85 mmHg (06/10 0823) SpO2:  [92 %-95 %] 95 % (06/10 0827) Weight:  [85.911 kg (189 lb 6.4 oz)] 85.911 kg (189 lb 6.4 oz) (06/10 0500) Last BM Date: 09/17/15  Weight change: Filed Weights   09/16/15 0405 09/17/15 0408 09/18/15 0500  Weight: 86.909 kg (191 lb 9.6 oz) 87.862 kg (193 lb 11.2 oz) 85.911 kg (189 lb 6.4 oz)    Intake/Output:   Intake/Output Summary (Last 24 hours) at 09/18/15 0837 Last data filed at 09/18/15 0824  Gross per 24 hour  Intake 1430.3 ml  Output   1250 ml  Net  180.3 ml     Physical Exam: General:  Sitting in bed. Dyspneic with talking HEENT: normal Neck: supple. JVP to ear . Carotids 2+ bilat; no bruits. No lymphadenopathy or thryomegaly appreciated. Cor: PMI laterally displaced. Tachy IRR, IRR + s3 Lungs: + rhonchi and wheezine Abdomen: soft, nontender, + mildly distended. No hepatosplenomegaly. No bruits or masses. Good bowel sounds. Extremities: no cyanosis, clubbing, rash, 2+ edema Neuro: alert & orientedx3, cranial nerves grossly intact. moves all 4 extremities w/o difficulty. Affect pleasant  Telemetry: AF 120s  Labs: Basic Metabolic Panel:  Recent Labs Lab 09/15/15 1109 09/15/15 1558 09/16/15 0218 09/17/15 0415  09/18/15 0329  NA 128*  --  126* 128* 131*  K 4.0  --  3.7 4.0 3.3*  CL 97*  --  95* 93* 95*  CO2 21*  --  18* 20* 27  GLUCOSE 144*  --  181* 135* 108*  BUN 20  --  30* 53* 39*  CREATININE 0.96  --  1.37* 1.66* 1.17  CALCIUM 8.8*  --  8.7* 8.6* 8.3*  MG  --  2.1  --   --   --     Liver Function Tests:  Recent Labs Lab 09/15/15 1109 09/16/15 0218 09/17/15 0415 09/17/15 0814 09/18/15 0329  AST 81* 118* 489*  --  247*  ALT 122* 181* 668*  --  527*  ALKPHOS 81 79 86  --  89  BILITOT 1.3* 1.3* 1.4* 1.2 1.2  PROT 6.0* 5.9* 5.9*  --  5.8*  ALBUMIN 3.6 3.5 3.7  --  3.4*   No results for input(s): LIPASE, AMYLASE in the last 168 hours. No results for input(s): AMMONIA in the last 168 hours.  CBC:  Recent Labs Lab 09/15/15 1109 09/16/15 0218 09/17/15 0415 09/18/15 0329  WBC 11.4* 8.7 19.9* 12.3*  NEUTROABS 9.0*  --  16.9*  --   HGB 13.5 12.7* 13.3 12.9*  HCT 40.6 38.7* 40.4 39.6  MCV 88.1 87.6 88.4 89.0  PLT 236 243 259 219    Cardiac Enzymes:  Recent Labs Lab 09/15/15 1109 09/15/15 1558 09/15/15 2016 09/16/15 0205  09/17/15 0416  CKTOTAL  --   --   --   --  770*  CKMB  --   --   --   --  32.9*  TROPONINI 0.07* 0.08* 0.07* 0.07*  --     BNP: BNP (last 3 results)  Recent Labs  09/15/15 1109  BNP 919.3*    ProBNP (last 3 results) No results for input(s): PROBNP in the last 8760 hours.    Other results:  Imaging: Ct Angio Chest Pe W/cm &/or Wo Cm  09/16/2015  CLINICAL DATA:  Shortness of breath, atrial fibrillation EXAM: CT ANGIOGRAPHY CHEST WITH CONTRAST TECHNIQUE: Multidetector CT imaging of the chest was performed using the standard protocol during bolus administration of intravenous contrast. Multiplanar CT image reconstructions and MIPs were obtained to evaluate the vascular anatomy. CONTRAST:  90 cc Isovue COMPARISON:  None. FINDINGS: Mediastinum/Lymph Nodes: Central airways are patent. Images of the thoracic inlet are unremarkable. A precarinal  lymph node measures 1.2 by 1.8 cm. Sub- carinal lymph node measures 1.7 by 1.9 cm. Right hilar lymph node measures 1.3 by 1.4 cm. Left hilar lymph node measures 1.6 by 1.3 cm. A left anterior mediastinal lymph node just lateral to aorta measures 1.4 x 1.6 cm. The study is of excellent technical quality. There is no evidence of pulmonary embolus. Atherosclerotic calcifications of thoracic aorta are noted. No aortic aneurysm. Lungs/Pleura: There is small right pleural effusion. Trace left pleural effusion. Extensive bronchitic changes are noted in right lower lobe. Mild perihilar bronchitic changes are noted bilaterally. There is linear atelectasis or early infiltrate in right lower lobe laterally please see axial image 83. No pulmonary edema. Upper abdomen: The visualized upper abdomen shows no adrenal gland mass. Trace perihepatic ascites. No intrahepatic biliary ductal dilatation. Musculoskeletal: Sagittal images of the spine shows mild degenerative changes thoracic spine. Sagittal view of the sternum is unremarkable. No destructive rib lesions are noted. Review of the MIP images confirms the above findings. IMPRESSION: 1. No pulmonary embolus is noted. 2. Mild mediastinal and bilateral hilar adenopathy. 3. There is small right pleural effusion. Trace left pleural effusion. Significant bronchitic changes are noted in right lower lobe. Mild bronchitic changes are noted bilateral perihilar. There is linear atelectasis or early infiltrate right lower lobe anterolaterally. 4. Mild degenerative changes thoracic spine. Electronically Signed   By: Natasha Mead M.D.   On: 09/16/2015 15:38      Medications:     Scheduled Medications: . aspirin  81 mg Oral Daily  . furosemide  40 mg Intravenous BID  . ipratropium  0.5 mg Nebulization TID  . metoprolol tartrate  12.5 mg Oral BID  . nicotine  21 mg Transdermal Daily     Infusions: . heparin 2,100 Units/hr (09/18/15 0700)  . milrinone       PRN  Medications:  benzonatate   Assessment:   1. New onset atrial fibrillation with rapid ventricular response:  2. Acute combined systolic and diastolic CHF:   --EF 15% 3. Tobacco abuse:  4. AKI - improving 5. Transaminitis 6. Hypokalemia  Plan/Discussion:    He has severe acute systolic HF and I suspect it is related to tachy-induced CM related to AF (which likely occurred in setting of recent illness). Although he remains tenuous, urine output starting to pick-up and renal function and LFTs improving so suspect perfusion is improved.   He has poor insight into his HF and overall health. Continues to refuse PICC and milrinone saying "everything is going well. I don't  want to mess it up. You know."   Will stop metoprolol. Switch to IV amio for rate control. Add digoxin and spiro. Continue IV lasix. Start low-dose losartan. Supp K+.  I told him that if he has further evidence of end-organ damage will need to reconsider PICC and inotropes.  Continue heparin for AF. Will need cath and TEE/DC-CV next week depending on course.    Length of Stay: 3  Arvilla Meres MD 09/18/2015, 8:37 AM  Advanced Heart Failure Team Pager [phone] (M-F; 7a - 4p)  Please contact [facility] for night-coverage after hours (4p -7a ) and weekends on [email or web address]  Addendum:  Ex-wife here this am (with food from [employer]). Long talk about situation.   They now agree to PICC line and milrinone as needed. Will order.   Dashonna Chagnon,MD 9:22 AM

## 2015-09-18 NOTE — Progress Notes (Addendum)
Peripherally Inserted Central Catheter/Midline Placement  The IV Nurse has discussed with the patient and/or persons authorized to consent for the patient, the purpose of this procedure and the potential benefits and risks involved with this procedure.  The benefits include less needle sticks, lab draws from the catheter and patient may be discharged home with the catheter and ability to do a PICC exchange if ordered by physician.  Risks include, but not limited to, infection, bleeding, blood clot (thrombus formation), and puncture of an artery; nerve damage and irregular heat beat.  Alternatives to this procedure were also discussed.  Bard patient guide, fact sheet an dpatient information card given to patient.  PICC/Midline Placement Documentation  PICC Triple Lumen 09/18/15 PICC Right Brachial 40 cm 0 cm (Active)  Indication for Insertion or Continuance of Line Vasoactive infusions 09/18/2015 12:06 PM  Exposed Catheter (cm) 0 cm 09/18/2015 12:06 PM  Site Assessment Clean;Dry;Intact 09/18/2015 12:06 PM  Lumen #1 Status Flushed;Saline locked;Blood return noted 09/18/2015 12:06 PM  Lumen #2 Status Flushed;Saline locked;Blood return noted 09/18/2015 12:06 PM  Lumen #3 Status Flushed;Saline locked;Blood return noted 09/18/2015 12:06 PM  Dressing Type Transparent 09/18/2015 12:06 PM  Dressing Status Clean;Dry;Intact 09/18/2015 12:06 PM  Dressing Change Due 09/25/15 09/18/2015 12:06 PM       Ethelda Chick 09/18/2015, 12:08 PM

## 2015-09-18 NOTE — Progress Notes (Signed)
  Amiodarone Drug - Drug Interaction Consult Note  Recommendations: Only interaction noted at this time is diuretic (Lasix) + Amiodarone- Monitor for hypokalemia. K was low this AM at 3.3 - orders for replacement entered by MD. Continue to monitor.   Amiodarone is metabolized by the cytochrome P450 system and therefore has the potential to cause many drug interactions. Amiodarone has an average plasma half-life of 50 days (range 20 to 100 days).   There is potential for drug interactions to occur several weeks or months after stopping treatment and the onset of drug interactions may be slow after initiating amiodarone.   []  Statins: Increased risk of myopathy. Simvastatin- restrict dose to 20mg  daily. Other statins: counsel patients to report any muscle pain or weakness immediately.  []  Anticoagulants: Amiodarone can increase anticoagulant effect. Consider warfarin dose reduction. Patients should be monitored closely and the dose of anticoagulant altered accordingly, remembering that amiodarone levels take several weeks to stabilize.  []  Antiepileptics: Amiodarone can increase plasma concentration of phenytoin, the dose should be reduced. Note that small changes in phenytoin dose can result in large changes in levels. Monitor patient and counsel on signs of toxicity.  []  Beta blockers: increased risk of bradycardia, AV block and myocardial depression. Sotalol - avoid concomitant use.  []   Calcium channel blockers (diltiazem and verapamil): increased risk of bradycardia, AV block and myocardial depression.  []   Cyclosporine: Amiodarone increases levels of cyclosporine. Reduced dose of cyclosporine is recommended.  []  Digoxin dose should be halved when amiodarone is started.  [x]  Diuretics: increased risk of cardiotoxicity if hypokalemia occurs.  []  Oral hypoglycemic agents (glyburide, glipizide, glimepiride): increased risk of hypoglycemia. Patient's glucose levels should be monitored  closely when initiating amiodarone therapy.   []  Drugs that prolong the QT interval:  Torsades de pointes risk may be increased with concurrent use - avoid if possible.  Monitor QTc, also keep magnesium/potassium WNL if concurrent therapy can't be avoided. Marland Kitchen Antibiotics: e.g. fluoroquinolones, erythromycin. . Antiarrhythmics: e.g. quinidine, procainamide, disopyramide, sotalol. . Antipsychotics: e.g. phenothiazines, haloperidol.  . Lithium, tricyclic antidepressants, and methadone. Thank You,  Fayne Norrie  09/18/2015 9:23 AM

## 2015-09-18 NOTE — Progress Notes (Signed)
Paged on call Dr. Antoine Poche regarding patient and wife trying to speak to MD about PICC placement and Milrinone therapy. Wife and patient refused PICC placement in order to start Milrinone therapy and CVP monitoring. Wife thought the patient had to be moved to ICU to be closely monitored as she stated she was told. I tried clarifying to patient and wife that we are capable of dealing with this type of medication and to closely monitor on this floor. They both still refused. Wife went home and MD Hochrein came to speak to patient. Dr. Antoine Poche spoke to me and told me the patient was too somnolent to speak. According to MD note, he will defer to the AM provider.

## 2015-09-18 NOTE — Progress Notes (Signed)
RN educated Reginald Hahn and patient about new medications, and CVP process. Pt and family verbalized understanding. PT walked around unit today, with RN, increased Shortness of breath that resolved within 5 minutes of returning to the room. Pt has wore 02 PRN, with sats around 90% when 02 is off. RN will continue to monitor

## 2015-09-18 NOTE — Progress Notes (Signed)
Subjective: This morning, his ex-wife is at bedside. She reported a number of misunderstandings yesterday with regards to Reginald timing of Reginald milrinone, peripheral access, transfer to ICU. She felt comfortable today though after she spoke with Reginald cardiology team with regards to Reginald Hahn's care.  Reginald Hahn himself denies any difficulty breathing or additional chest pain. He does however note his feet are changing color and are slightly swollen.  Objective: Vital signs in last 24 hours: Filed Vitals:   09/18/15 0230 09/18/15 0500 09/18/15 0823 09/18/15 0827  BP:  106/67 126/85   Pulse: 55 114 70   Temp:  97.8 F (36.6 C) 98.3 F (36.8 C)   TempSrc:   Oral   Resp: 18 20    Height:      Weight:  189 lb 6.4 oz (85.911 kg)    SpO2: 92% 94% 95% 95%   Weight change: -4 lb 4.8 oz (-1.95 kg)  Intake/Output Summary (Last 24 hours) at 09/18/15 1058 Last data filed at 09/18/15 0852  Gross per 24 hour  Intake 1430.3 ml  Output   1400 ml  Net   30.3 ml    General: resting in bed, incredibly hard of hearing HEENT: JVD noted to Reginald angle of jaw, no scleral icterus Cardiac: Irregularly regular rhythm Pulm: Trace inspiratory crackles right greater than left Abd: soft, nontender, nondistended, BS present Ext: pitting edema, feet with some pallor and cool to touch  Neuro: responds to questions appropriately; moving all extremities freely   Lab Results: Basic Metabolic Panel:  Recent Labs Lab 09/15/15 1558  09/17/15 0415 09/18/15 0329  NA  --   < > 128* 131*  K  --   < > 4.0 3.3*  CL  --   < > 93* 95*  CO2  --   < > 20* 27  GLUCOSE  --   < > 135* 108*  BUN  --   < > 53* 39*  CREATININE  --   < > 1.66* 1.17  CALCIUM  --   < > 8.6* 8.3*  MG 2.1  --   --   --   < > = values in this interval not displayed. Liver Function Tests:  Recent Labs Lab 09/17/15 0415 09/17/15 0814 09/18/15 0329  AST 489*  --  247*  ALT 668*  --  527*  ALKPHOS 86  --  89  BILITOT 1.4* 1.2 1.2    PROT 5.9*  --  5.8*  ALBUMIN 3.7  --  3.4*   CBC:  Recent Labs Lab 09/15/15 1109  09/17/15 0415 09/18/15 0329  WBC 11.4*  < > 19.9* 12.3*  NEUTROABS 9.0*  --  16.9*  --   HGB 13.5  < > 13.3 12.9*  HCT 40.6  < > 40.4 39.6  MCV 88.1  < > 88.4 89.0  PLT 236  < > 259 219  < > = values in this interval not displayed. Cardiac Enzymes:  Recent Labs Lab 09/15/15 1558 09/15/15 2016 09/16/15 0205 09/17/15 0416  CKTOTAL  --   --   --  770*  CKMB  --   --   --  32.9*  TROPONINI 0.08* 0.07* 0.07*  --    Hemoglobin A1C:  Recent Labs Lab 09/15/15 1558  HGBA1C 5.9*   Fasting Lipid Panel:  Recent Labs Lab 09/16/15 0218  CHOL 131  HDL 24*  LDLCALC 93  TRIG 71  CHOLHDL 5.5   Thyroid Function Tests:  Recent Labs  Lab 09/15/15 1558  TSH 1.387   Coagulation:  Recent Labs Lab 09/15/15 1109  LABPROT 17.4*  INR 1.41   Urine Drug Screen: Drugs of Abuse     Component Value Date/Time   LABOPIA NONE DETECTED 09/15/2015 2004   COCAINSCRNUR NONE DETECTED 09/15/2015 2004   LABBENZ POSITIVE* 09/15/2015 2004   AMPHETMU NONE DETECTED 09/15/2015 2004   THCU POSITIVE* 09/15/2015 2004   LABBARB NONE DETECTED 09/15/2015 2004    Micro Results: Recent Results (from Reginald past 240 hour(s))  MRSA PCR Screening     Status: None   Collection Time: 09/15/15  4:19 PM  Result Value Ref Range Status   MRSA by PCR NEGATIVE NEGATIVE Final    Comment:        Reginald GeneXpert MRSA Assay (FDA approved for NASAL specimens only), is one component of a comprehensive MRSA colonization surveillance program. It is not intended to diagnose MRSA infection nor to guide or monitor treatment for MRSA infections.    Studies/Results: Ct Angio Chest Pe W/cm &/or Wo Cm  09/16/2015  CLINICAL DATA:  Shortness of breath, atrial fibrillation EXAM: CT ANGIOGRAPHY CHEST WITH CONTRAST TECHNIQUE: Multidetector CT imaging of Reginald chest was performed using Reginald standard protocol during bolus administration of  intravenous contrast. Multiplanar CT image reconstructions and MIPs were obtained to evaluate Reginald vascular anatomy. CONTRAST:  90 cc Isovue COMPARISON:  None. FINDINGS: Mediastinum/Lymph Nodes: Central airways are patent. Images of Reginald thoracic inlet are unremarkable. A precarinal lymph node measures 1.2 by 1.8 cm. Sub- carinal lymph node measures 1.7 by 1.9 cm. Right hilar lymph node measures 1.3 by 1.4 cm. Left hilar lymph node measures 1.6 by 1.3 cm. A left anterior mediastinal lymph node just lateral to aorta measures 1.4 x 1.6 cm. Reginald study is of excellent technical quality. There is no evidence of pulmonary embolus. Atherosclerotic calcifications of thoracic aorta are noted. No aortic aneurysm. Lungs/Pleura: There is small right pleural effusion. Trace left pleural effusion. Extensive bronchitic changes are noted in right lower lobe. Mild perihilar bronchitic changes are noted bilaterally. There is linear atelectasis or early infiltrate in right lower lobe laterally please see axial image 83. No pulmonary edema. Upper abdomen: Reginald visualized upper abdomen shows no adrenal gland mass. Trace perihepatic ascites. No intrahepatic biliary ductal dilatation. Musculoskeletal: Sagittal images of Reginald spine shows mild degenerative changes thoracic spine. Sagittal view of Reginald sternum is unremarkable. No destructive rib lesions are noted. Review of Reginald MIP images confirms Reginald above findings. IMPRESSION: 1. No pulmonary embolus is noted. 2. Mild mediastinal and bilateral hilar adenopathy. 3. There is small right pleural effusion. Trace left pleural effusion. Significant bronchitic changes are noted in right lower lobe. Mild bronchitic changes are noted bilateral perihilar. There is linear atelectasis or early infiltrate right lower lobe anterolaterally. 4. Mild degenerative changes thoracic spine. Electronically Signed   By: Natasha Mead M.D.   On: 09/16/2015 15:38   Medications: I have reviewed Reginald Hahn's current  medications. Scheduled Meds: . amiodarone  150 mg Intravenous Once  . aspirin  81 mg Oral Daily  . digoxin  0.25 mg Oral Daily  . furosemide  60 mg Intravenous BID  . ipratropium  0.5 mg Nebulization TID  . losartan  12.5 mg Oral Daily  . nicotine  21 mg Transdermal Daily  . potassium chloride  20 mEq Oral BID  . spironolactone  25 mg Oral Daily   Continuous Infusions: . amiodarone     Followed by  . amiodarone    .  heparin 2,100 Units/hr (09/18/15 0935)   PRN Meds:.benzonatate Assessment/Plan:  Reginald Hahn is a 65 year old man with tobacco use hospitalized for atrial fibrillation with RVR found to have heart failure with reduced ejection fraction now with improvement in hepatic and renal function  Heart failure with reduced ejection fraction complicated by acute kidney injury and ischemic hepatitis: Likely mediated by arrhythmia though he does have risk factors for ischemic disease as evident by atherosclerosis of Reginald thoracic aorta on CT imaging. Echo 09/15/68 notable for EF 15%, severe diffuse hypokinesis, mid-moderate tricuspid regurg, severe RA dilatation, mild LA dilatation. Plan for cardiac catheterization next week as well as TEE/DCCV. -Continue Lasix 60 mg IV twice daily -Continue aspirin  daily -Started milrinone, losartan, digoxin, spironolactone per Heart Failure recommendations  New onset atrial fibrillation: Suspicion still remains for viral myocarditis given cardiomegaly on initial chest x-ray in Reginald absence of other causes by workup so far. Heart rate trending 55-116 over Reginald last 24 hours. -Continue telemetry -Switched metoprolol 12.5 mg twice daily to amiodarone IV per Cardiology -Continue heparin IV with plan to transition to oral anticoagulant following cardiac catheterization  Transaminitis: Likely 2/2 poor perfusion. ALT and AST likely as cardiac output improves. Hepatitis panel and HIV reassuring. -Check CMET tomorrow  Acute kidney injury: Likely 2/2 poor  renal perfusion. Improving with Crt 1.2 today, down from 1.7 yesterday, and 1.4 on admission.   Hyponatremia: Suspect hypotonic hypervolemic hyponatremia. Urine osmolality over 100 which is consistent with ADH secretions though urine sodium undetectable which is consistent with retention in Reginald setting of decreased effective arterial volume. Sodium 131, improved from 128, on admission with IV diuresis. -Lasix IV as noted above  #FEN:  -Diet: Heart healthy/carb modified  #DVT prophylaxis: Heparin IV  #CODE STATUS: FULL CODE -Confirmed with Hahn on admission  Dispo: Disposition is deferred at this time, awaiting improvement of current medical problems.  .   Reginald Hahn does not have a current PCP (No primary care provider on file.) and does need an [facility] hospital follow-up appointment after discharge.  Reginald Hahn does not have transportation limitations that hinder transportation to clinic appointments.  .Services Needed at time of discharge: Y = Yes, Blank = No PT:   OT:   RN:   Equipment:   Other:     LOS: 3 days   Beather Arbour, MD 09/18/2015, 10:58 AM

## 2015-09-18 NOTE — Progress Notes (Signed)
   I came up to speak with the patient and his wife.  They had not wanted a PICC line or Milrinone therapy.  They thought that they were going to be moved to an ICU for this.  I was not able to come up to talk to the wife earlier as I was in the ED.  She had already left.  The patient is too somnolent to have a conversation.  They are refusing the PICC line tonight and will need to discuss this with the rounding MD in the morning.  Due to the lateness of the hour and the fact that we would not be able to get the PICC placed I will not call the wife at home but defer to the AM provider.  

## 2015-09-18 NOTE — Progress Notes (Signed)
ANTICOAGULATION CONSULT NOTE - Follow Up Consult  Pharmacy Consult for Heparin  Indication: atrial fibrillation  Allergies  Allergen Reactions  . Codeine Itching    whelps  . Other     Seasonal allergies, "all spices, salt, pepper"   Patient Measurements: Height: 6' (182.9 cm) Weight: 189 lb 6.4 oz (85.911 kg) IBW/kg (Calculated) : 77.6  Vital Signs: Temp: 97.7 F (36.5 C) (06/10 2055) Temp Source: Oral (06/10 1619) BP: 102/81 mmHg (06/10 2055) Pulse Rate: 102 (06/10 2055)  Labs:  Recent Labs  09/16/15 0205  09/16/15 0218  09/17/15 0415 09/17/15 0416  09/18/15 0329 09/18/15 1245 09/18/15 2146  HGB  --   < > 12.7*  --  13.3  --   --  12.9*  --   --   HCT  --   --  38.7*  --  40.4  --   --  39.6  --   --   PLT  --   --  243  --  259  --   --  219  --   --   HEPARINUNFRC  --   --   --   < > 0.19*  --   < > 0.22* 0.81* 0.50  CREATININE  --   --  1.37*  --  1.66*  --   --  1.17  --   --   CKTOTAL  --   --   --   --   --  770*  --   --   --   --   CKMB  --   --   --   --   --  32.9*  --   --   --   --   TROPONINI 0.07*  --   --   --   --   --   --   --   --   --   < > = values in this interval not displayed.  Estimated Creatinine Clearance: 70 mL/min (by C-G formula based on Cr of 1.17).  Assessment: 65 year old male continues on heparin for Afib Heparin level back within therapeutic range at 0.50  Goal of Therapy:  Heparin level 0.3-0.7 units/ml Monitor platelets by anticoagulation protocol: Yes   Plan:  Continue heparin at 2000 units / hr Follow up AM labs  Follow up transition to DOAC  Thank you Okey Regal, PharmD [phone]  09/18/2015,10:20 PM

## 2015-09-18 NOTE — Progress Notes (Signed)
ANTICOAGULATION CONSULT NOTE - Follow Up Consult  Pharmacy Consult for Heparin  Indication: atrial fibrillation  Allergies  Allergen Reactions  . Codeine Itching    whelps  . Other     Seasonal allergies, "all spices, salt, pepper"   Patient Measurements: Height: 6' (182.9 cm) Weight: 189 lb 6.4 oz (85.911 kg) IBW/kg (Calculated) : 77.6  Vital Signs: Temp: 98.2 F (36.8 C) (06/10 1229) Temp Source: Oral (06/10 1229) BP: 118/84 mmHg (06/10 1335) Pulse Rate: 79 (06/10 1335)  Labs:  Recent Labs  09/15/15 1558  09/15/15 2016 09/16/15 0205  09/16/15 0218  09/17/15 0415 09/17/15 0416  09/17/15 2211 09/18/15 0329 09/18/15 1245  HGB  --   --   --   --   < > 12.7*  --  13.3  --   --   --  12.9*  --   HCT  --   --   --   --   --  38.7*  --  40.4  --   --   --  39.6  --   PLT  --   --   --   --   --  243  --  259  --   --   --  219  --   HEPARINUNFRC  --   < >  --   --   --   --   < > 0.19*  --   < > 0.35 0.22* 0.81*  CREATININE  --   --   --   --   --  1.37*  --  1.66*  --   --   --  1.17  --   CKTOTAL  --   --   --   --   --   --   --   --  770*  --   --   --   --   CKMB  --   --   --   --   --   --   --   --  32.9*  --   --   --   --   TROPONINI 0.08*  --  0.07* 0.07*  --   --   --   --   --   --   --   --   --   < > = values in this interval not displayed.  Estimated Creatinine Clearance: 70 mL/min (by C-G formula based on Cr of 1.17).  Assessment: 65 year old male on IV heparin for atrial fibrillation. Heparin to continue until patient has cardiac cath and then plan to convert to DOAC therapy.   Heparin level is elevated at 0.81 after increasing to 2100 units/hr this AM for low level. CBC has remained stable. Discussed with RN Benetta Spar) and level was drawn by peripheral stick from opposite arm than heparin running in. No issues with infusion or bleeding.   Goal of Therapy:  Heparin level 0.3-0.7 units/ml Monitor platelets by anticoagulation protocol: Yes   Plan:   Decrease heparin to 2000 units/hr Recheck HL in 6 hours.  Daily HL and CBC while on therapy.   Link Snuffer, PharmD, BCPS Clinical Pharmacist [phone]  09/18/2015,2:11 PM

## 2015-09-19 DIAGNOSIS — H538 Other visual disturbances: Secondary | ICD-10-CM

## 2015-09-19 LAB — MAGNESIUM: MAGNESIUM: 2.2 mg/dL (ref 1.7–2.4)

## 2015-09-19 LAB — CBC
HCT: 37.9 % — ABNORMAL LOW (ref 39.0–52.0)
HEMOGLOBIN: 12.4 g/dL — AB (ref 13.0–17.0)
MCH: 29.7 pg (ref 26.0–34.0)
MCHC: 32.7 g/dL (ref 30.0–36.0)
MCV: 90.9 fL (ref 78.0–100.0)
PLATELETS: 196 10*3/uL (ref 150–400)
RBC: 4.17 MIL/uL — AB (ref 4.22–5.81)
RDW: 14.7 % (ref 11.5–15.5)
WBC: 9.5 10*3/uL (ref 4.0–10.5)

## 2015-09-19 LAB — HEPATIC FUNCTION PANEL
ALK PHOS: 91 U/L (ref 38–126)
ALT: 349 U/L — ABNORMAL HIGH (ref 17–63)
AST: 96 U/L — ABNORMAL HIGH (ref 15–41)
Albumin: 3.1 g/dL — ABNORMAL LOW (ref 3.5–5.0)
BILIRUBIN DIRECT: 0.4 mg/dL (ref 0.1–0.5)
BILIRUBIN INDIRECT: 0.5 mg/dL (ref 0.3–0.9)
BILIRUBIN TOTAL: 0.9 mg/dL (ref 0.3–1.2)
TOTAL PROTEIN: 5.2 g/dL — AB (ref 6.5–8.1)

## 2015-09-19 LAB — BASIC METABOLIC PANEL
ANION GAP: 8 (ref 5–15)
BUN: 29 mg/dL — ABNORMAL HIGH (ref 6–20)
CALCIUM: 8.1 mg/dL — AB (ref 8.9–10.3)
CHLORIDE: 96 mmol/L — AB (ref 101–111)
CO2: 27 mmol/L (ref 22–32)
CREATININE: 0.99 mg/dL (ref 0.61–1.24)
GFR calc non Af Amer: >60 mL/min (ref >60)
Glucose, Bld: 96 mg/dL (ref 65–99)
Potassium: 3.6 mmol/L (ref 3.5–5.1)
SODIUM: 131 mmol/L — AB (ref 135–145)

## 2015-09-19 LAB — CARBOXYHEMOGLOBIN
Carboxyhemoglobin: 1.7 % — ABNORMAL HIGH (ref 0.5–1.5)
Methemoglobin: 0.8 % (ref 0.0–1.5)
O2 SAT: 64 %
TOTAL HEMOGLOBIN: 11.9 g/dL — AB (ref 13.5–18.0)

## 2015-09-19 LAB — HIV ANTIBODY (ROUTINE TESTING W REFLEX): HIV Screen 4th Generation wRfx: NONREACTIVE

## 2015-09-19 LAB — HEPARIN LEVEL (UNFRACTIONATED): Heparin Unfractionated: 0.43 IU/mL (ref 0.30–0.70)

## 2015-09-19 MED ORDER — POTASSIUM CHLORIDE CRYS ER 20 MEQ PO TBCR
40.0000 meq | EXTENDED_RELEASE_TABLET | Freq: Two times a day (BID) | ORAL | Status: DC
  Administered 2015-09-19 – 2015-09-21 (×5): 40 meq via ORAL
  Filled 2015-09-19 (×5): qty 2

## 2015-09-19 MED ORDER — GUAIFENESIN 100 MG/5ML PO SOLN: 5.0000 mL | ORAL | Status: DC | PRN

## 2015-09-19 MED ORDER — GUAIFENESIN 100 MG/5ML PO SOLN
5.0000 mL | ORAL | Status: DC | PRN
  Administered 2015-09-19: 100 mg via ORAL
  Filled 2015-09-19: qty 5

## 2015-09-19 MED ORDER — METOLAZONE 5 MG PO TABS
2.5000 mg | ORAL_TABLET | Freq: Every day | ORAL | Status: DC
  Administered 2015-09-19 – 2015-09-21 (×3): 2.5 mg via ORAL
  Filled 2015-09-19 (×3): qty 1

## 2015-09-19 MED ORDER — FUROSEMIDE 10 MG/ML IJ SOLN
80.0000 mg | Freq: Two times a day (BID) | INTRAMUSCULAR | Status: DC
  Administered 2015-09-19 – 2015-09-21 (×4): 80 mg via INTRAVENOUS
  Filled 2015-09-19 (×4): qty 8

## 2015-09-19 MED ORDER — LOSARTAN POTASSIUM 50 MG PO TABS
25.0000 mg | ORAL_TABLET | Freq: Every day | ORAL | Status: DC
  Administered 2015-09-20 – 2015-09-21 (×2): 25 mg via ORAL
  Filled 2015-09-19 (×2): qty 1

## 2015-09-19 MED ORDER — FUROSEMIDE 10 MG/ML IJ SOLN
20.0000 mg | Freq: Once | INTRAMUSCULAR | Status: AC
  Administered 2015-09-19: 20 mg via INTRAVENOUS
  Filled 2015-09-19: qty 2

## 2015-09-19 MED ORDER — POTASSIUM CHLORIDE CRYS ER 20 MEQ PO TBCR
40.0000 meq | EXTENDED_RELEASE_TABLET | Freq: Once | ORAL | Status: AC
  Administered 2015-09-19: 40 meq via ORAL
  Filled 2015-09-19: qty 2

## 2015-09-19 NOTE — Progress Notes (Signed)
Subjective: This morning, he complains of some intermittent blurry vision and dizziness that lasted for about 30-60 minutes though has resolved when we came to examine him. He is concerned may be related to the milrinone. He did tolerate some ambulation yesterday with the assistance of RN.  Objective: Vital signs in last 24 hours: Filed Vitals:   09/19/15 0615 09/19/15 0700 09/19/15 0753 09/19/15 0806  BP:  97/77    Pulse: 67 54  110  Temp:   98.2 F (36.8 C)   TempSrc:   Oral   Resp: 22 24    Height:      Weight:      SpO2: 95% 94%     Weight change: -9.6 oz (-0.272 kg)  Intake/Output Summary (Last 24 hours) at 09/19/15 1610 Last data filed at 09/19/15 0700  Gross per 24 hour  Intake 2107.56 ml  Output   2370 ml  Net -262.44 ml   General: Sitting at bedside, incredibly hard of hearing HEENT: JVD noted albeit improved from yesterday, no scleral icterus Cardiac: Irregularly regular rhythm, no murmurs appreciated Pulm: clear to auscultation bilaterally Ext: pitting edema, feet with some pallor and cool to touch  Neuro: mild visual field abnormality noted with right lower quadrant when asked to visualize incoming finger, 5/5 upper and lower extremity strength, 2+ grip strength, 2+ brachial/patellar reflexes, finger to nose & rapid alternating movements intact  Lab Results: Basic Metabolic Panel:  Recent Labs Lab 09/15/15 1558  09/18/15 0329 09/19/15 0429  NA  --   < > 131* 131*  K  --   < > 3.3* 3.6  CL  --   < > 95* 96*  CO2  --   < > 27 27  GLUCOSE  --   < > 108* 96  BUN  --   < > 39* 29*  CREATININE  --   < > 1.17 0.99  CALCIUM  --   < > 8.3* 8.1*  MG 2.1  --   --  2.2  < > = values in this interval not displayed. Liver Function Tests:  Recent Labs Lab 09/18/15 0329 09/19/15 0429  AST 247* 96*  ALT 527* 349*  ALKPHOS 89 91  BILITOT 1.2 0.9  PROT 5.8* 5.2*  ALBUMIN 3.4* 3.1*   CBC:  Recent Labs Lab 09/15/15 1109  09/17/15 0415 09/18/15 0329  09/19/15 0429  WBC 11.4*  < > 19.9* 12.3* 9.5  NEUTROABS 9.0*  --  16.9*  --   --   HGB 13.5  < > 13.3 12.9* 12.4*  HCT 40.6  < > 40.4 39.6 37.9*  MCV 88.1  < > 88.4 89.0 90.9  PLT 236  < > 259 219 196  < > = values in this interval not displayed. Cardiac Enzymes:  Recent Labs Lab 09/15/15 1558 09/15/15 2016 09/16/15 0205 09/17/15 0416  CKTOTAL  --   --   --  770*  CKMB  --   --   --  32.9*  TROPONINI 0.08* 0.07* 0.07*  --    Hemoglobin A1C:  Recent Labs Lab 09/15/15 1558  HGBA1C 5.9*   Fasting Lipid Panel:  Recent Labs Lab 09/16/15 0218  CHOL 131  HDL 24*  LDLCALC 93  TRIG 71  CHOLHDL 5.5   Thyroid Function Tests:  Recent Labs Lab 09/15/15 1558  TSH 1.387   Coagulation:  Recent Labs Lab 09/15/15 1109  LABPROT 17.4*  INR 1.41   Urine Drug Screen: Drugs of Abuse  Component Value Date/Time   LABOPIA NONE DETECTED 09/15/2015 2004   COCAINSCRNUR NONE DETECTED 09/15/2015 2004   LABBENZ POSITIVE* 09/15/2015 2004   AMPHETMU NONE DETECTED 09/15/2015 2004   THCU POSITIVE* 09/15/2015 2004   LABBARB NONE DETECTED 09/15/2015 2004    Micro Results: Recent Results (from the past 240 hour(s))  MRSA PCR Screening     Status: None   Collection Time: 09/15/15  4:19 PM  Result Value Ref Range Status   MRSA by PCR NEGATIVE NEGATIVE Final    Comment:        The GeneXpert MRSA Assay (FDA approved for NASAL specimens only), is one component of a comprehensive MRSA colonization surveillance program. It is not intended to diagnose MRSA infection nor to guide or monitor treatment for MRSA infections.    Studies/Results: Dg Chest Port 1 View  09/18/2015  CLINICAL DATA:  Right PICC line placement EXAM: PORTABLE CHEST 1 VIEW COMPARISON:  09/15/2015 FINDINGS: Cardiomediastinal silhouette is stable. There is streaky atelectasis or infiltrate right basilar and small right pleural effusion. Right arm PICC line with tip in SVC right atrium junction. No  pneumothorax. Bronchitic changes right lower lobe. IMPRESSION: Streaky atelectasis or infiltrate right base and small right pleural effusion. Right arm PICC line with tip in SVC right atrium junction. No pneumothorax. Bronchitic changes right lower lobe. Electronically Signed   By: Natasha Mead M.D.   On: 09/18/2015 12:39   Medications: I have reviewed the patient's current medications. Scheduled Meds: . aspirin  81 mg Oral Daily  . digoxin  0.25 mg Oral Daily  . furosemide  60 mg Intravenous BID  . losartan  12.5 mg Oral Daily  . nicotine  21 mg Transdermal Daily  . potassium chloride  20 mEq Oral BID  . sodium chloride flush  10-40 mL Intracatheter Q12H  . spironolactone  25 mg Oral Daily   Continuous Infusions: . amiodarone 30 mg/hr (09/19/15 0700)  . heparin 2,000 Units/hr (09/19/15 0700)  . milrinone 0.25 mcg/kg/min (09/19/15 0808)   PRN Meds:.guaiFENesin, ipratropium, sodium chloride flush Assessment/Plan:  Mr. Geno is a 65 year old man with tobacco use hospitalized for atrial fibrillation with RVR found to have heart failure with reduced ejection fraction now with complaints of vision changes.  Heart failure with reduced ejection fraction complicated by acute kidney injury and ischemic hepatitis: Likely mediated by arrhythmia though he does have risk factors for ischemic disease as evident by atherosclerosis of the thoracic aorta on CT imaging. Echo 09/15/68 notable for EF 15%, severe diffuse hypokinesis, mid-moderate tricuspid regurg, severe RA dilatation, mild LA dilatation. Plan for cardiac catheterization next week as well as TEE/DCCV. . -Continue Lasix 60 mg IV twice daily -Continue aspirin 81mg  daily -Continue digoxin 0.25mg  daily -Continue spironolactone 25mg  daily -Continue losartan 12.5mg  daily -Continue milrinone gtt per Heart Failure recommendations  Vision changes: Unclear if vision changes are related to milrinone or amiodarone though low concern for an acute  neurologic process in the absence of other focal deficits   -Reassured patient and encouraged to call if he experiences similar symptoms -Consider neuroimaging and optho consult should his symptoms persist  New onset atrial fibrillation: Suspicion still remains for viral myocarditis given cardiomegaly on initial chest x-ray in the absence of other causes by workup so far. Heart rate trending 54-110 over the last 24 hours. -Continue telemetry -Continue amiodarone gtt -Continue heparin IV with plan to transition to oral anticoagulant following cardiac catheterization  Transaminitis: Likely 2/2 poor perfusion. ALT and AST likely as cardiac  output improves. Hepatitis panel and HIV reassuring. -Check CMET tomorrow  Acute kidney injury: Resolved. Likely 2/2 poor renal perfusion. Improving with Crt 1.0 today, down from 1.7 yesterday, and 1.0 on admission.   Hyponatremia: Suspect hypotonic hypervolemic hyponatremia. Urine osmolality over 100 which is consistent with ADH secretions though urine sodium undetectable which is consistent with retention in the setting of decreased effective arterial volume. Sodium 131, stable and improved from 128 on admission with IV diuresis. -Lasix IV as noted above  #FEN:  -Diet: Heart healthy/carb modified  #DVT prophylaxis: Heparin IV  #CODE STATUS: FULL CODE -Confirmed with patient on admission  Dispo: Disposition is deferred at this time, awaiting improvement of current medical problems.  .   The patient does not have a current PCP (No primary care provider on file.) and does need an [facility] hospital follow-up appointment after discharge.  The patient does not have transportation limitations that hinder transportation to clinic appointments.  .Services Needed at time of discharge: Y = Yes, Blank = No PT:   OT:   RN:   Equipment:   Other:     LOS: 4 days   Beather Arbour, MD 09/19/2015, 8:33 AM

## 2015-09-19 NOTE — Progress Notes (Signed)
Contacted patient's partner, Marylu Lund, about new cough medication being started. Also made partner aware that Milrinone was started and the reason behind it. She wants to be contacted when any new medications or procedures are started.

## 2015-09-19 NOTE — Progress Notes (Addendum)
Paged Teaching services, spoke to Dr. Johnny Bridge. Patient was coughing and refused to take tessalon perles for cough. Patient stated, "It makes me drowsy and knocks me out". Received an order for Robitussin prn and patient agreed to take, also spoke to ex (wife). Patient also complained of feeling clammy. Started Milrinone around 2130 per Dr. Gala Romney. Explained to Dr. Johnny Bridge how the patient felt even though vital signs were within normal limits. MD stated to call back and to report if patient felt better after robitussin and to report any abnormalities. Will continue to monitor patient and follow up with MD.

## 2015-09-19 NOTE — Progress Notes (Signed)
ANTICOAGULATION CONSULT NOTE - Follow Up Consult  Pharmacy Consult for Heparin  Indication: atrial fibrillation  Allergies  Allergen Reactions  . Codeine Itching    whelps  . Other     Seasonal allergies, "all spices, salt, pepper"   Patient Measurements: Height: 6' (182.9 cm) Weight: 188 lb 12.8 oz (85.639 kg) IBW/kg (Calculated) : 77.6  Vital Signs: Temp: 97.6 F (36.4 C) (06/11 0320) Temp Source: Core (Comment) (06/10 2340) BP: 97/77 mmHg (06/11 0700) Pulse Rate: 54 (06/11 0700)  Labs:  Recent Labs  09/17/15 0415 09/17/15 0416  09/18/15 0329 09/18/15 1245 09/18/15 2146 09/19/15 0429  HGB 13.3  --   --  12.9*  --   --  12.4*  HCT 40.4  --   --  39.6  --   --  37.9*  PLT 259  --   --  219  --   --  196  HEPARINUNFRC 0.19*  --   < > 0.22* 0.81* 0.50 0.43  CREATININE 1.66*  --   --  1.17  --   --  0.99  CKTOTAL  --  770*  --   --   --   --   --   CKMB  --  32.9*  --   --   --   --   --   < > = values in this interval not displayed.  Estimated Creatinine Clearance: 82.7 mL/min (by C-G formula based on Cr of 0.99).  Assessment: 65 year old male continues on heparin for atrial fibrillation.   Heparin level is therapeutic x 2 on 2000 units/hr.  Planning for cath and TEE/DCCV next week.  H/H is stable. Platelets are starting to trend down - monitor.  No bleeding reported.   Goal of Therapy:  Heparin level 0.3-0.7 units/ml Monitor platelets by anticoagulation protocol: Yes   Plan:  Continue heparin at 2000 units / hr Follow up AM labs Follow up transition to DOAC after cath  Link Snuffer, PharmD, BCPS Clinical Pharmacist [phone] 09/19/2015,7:30 AM

## 2015-09-19 NOTE — Progress Notes (Signed)
Paged teaching services twice in order to update MD about Mr. Madeira. Will continue to monitor patient.

## 2015-09-19 NOTE — Progress Notes (Signed)
Advanced Heart Failure Rounding Note   Subjective:    65 year old male without prior cardiac history who presented to the ED with progressive dyspnea and fatigue and was found to be in rapid atrial fibrillation after recent flu-like illness. EF found to be 15%  PICC placed yesterday. Co-ox 37%  Started on milrinone 0.25 and IV amio. Co-ox this am 64%  CVP 15  Feeling better - denies SOB. Diuresing well. Renal function improved.   Remains in AF 110s    Objective:   Weight Range:  Vital Signs:   Temp:  [97.4 F (36.3 C)-98.5 F (36.9 C)] 98.2 F (36.8 C) (06/11 0753) Pulse Rate:  [28-162] 110 (06/11 0806) Resp:  [14-31] 24 (06/11 0700) BP: (88-139)/(53-125) 97/77 mmHg (06/11 0700) SpO2:  [88 %-99 %] 94 % (06/11 0700) Weight:  [85.639 kg (188 lb 12.8 oz)] 85.639 kg (188 lb 12.8 oz) (06/11 0550) Last BM Date: 09/17/15  Weight change: Filed Weights   09/17/15 0408 09/18/15 0500 09/19/15 0550  Weight: 87.862 kg (193 lb 11.2 oz) 85.911 kg (189 lb 6.4 oz) 85.639 kg (188 lb 12.8 oz)    Intake/Output:   Intake/Output Summary (Last 24 hours) at 09/19/15 0819 Last data filed at 09/19/15 0700  Gross per 24 hour  Intake 2467.56 ml  Output   2370 ml  Net  97.56 ml     Physical Exam: General:  Sitting in bed.NAD HEENT: normal Neck: supple. JVP to ear . Carotids 2+ bilat; no bruits. No lymphadenopathy or thryomegaly appreciated. Cor: PMI laterally displaced. Tachy IRR, IRR + s3 Lungs: mild crackles Abdomen: soft, nontender, + mildly distended. No hepatosplenomegaly. No bruits or masses. Good bowel sounds. Extremities: no cyanosis, clubbing, rash, 1+ edema Neuro: alert & orientedx3, cranial nerves grossly intact. moves all 4 extremities w/o difficulty. Affect pleasant  Telemetry: AF 110s  Labs: Basic Metabolic Panel:  Recent Labs Lab 09/15/15 1109 09/15/15 1558 09/16/15 0218 09/17/15 0415 09/18/15 0329 09/19/15 0429  NA 128*  --  126* 128* 131* 131*  K 4.0   --  3.7 4.0 3.3* 3.6  CL 97*  --  95* 93* 95* 96*  CO2 21*  --  18* 20* 27 27  GLUCOSE 144*  --  181* 135* 108* 96  BUN 20  --  30* 53* 39* 29*  CREATININE 0.96  --  1.37* 1.66* 1.17 0.99  CALCIUM 8.8*  --  8.7* 8.6* 8.3* 8.1*  MG  --  2.1  --   --   --  2.2    Liver Function Tests:  Recent Labs Lab 09/15/15 1109 09/16/15 0218 09/17/15 0415 09/17/15 0814 09/18/15 0329 09/19/15 0429  AST 81* 118* 489*  --  247* 96*  ALT 122* 181* 668*  --  527* 349*  ALKPHOS 81 79 86  --  89 91  BILITOT 1.3* 1.3* 1.4* 1.2 1.2 0.9  PROT 6.0* 5.9* 5.9*  --  5.8* 5.2*  ALBUMIN 3.6 3.5 3.7  --  3.4* 3.1*   No results for input(s): LIPASE, AMYLASE in the last 168 hours. No results for input(s): AMMONIA in the last 168 hours.  CBC:  Recent Labs Lab 09/15/15 1109 09/16/15 0218 09/17/15 0415 09/18/15 0329 09/19/15 0429  WBC 11.4* 8.7 19.9* 12.3* 9.5  NEUTROABS 9.0*  --  16.9*  --   --   HGB 13.5 12.7* 13.3 12.9* 12.4*  HCT 40.6 38.7* 40.4 39.6 37.9*  MCV 88.1 87.6 88.4 89.0 90.9  PLT 236 243 259  219 196    Cardiac Enzymes:  Recent Labs Lab 09/15/15 1109 09/15/15 1558 09/15/15 2016 09/16/15 0205 09/17/15 0416  CKTOTAL  --   --   --   --  770*  CKMB  --   --   --   --  32.9*  TROPONINI 0.07* 0.08* 0.07* 0.07*  --     BNP: BNP (last 3 results)  Recent Labs  09/15/15 1109  BNP 919.3*    ProBNP (last 3 results) No results for input(s): PROBNP in the last 8760 hours.    Other results:  Imaging: Dg Chest Port 1 View  09/18/2015  CLINICAL DATA:  Right PICC line placement EXAM: PORTABLE CHEST 1 VIEW COMPARISON:  09/15/2015 FINDINGS: Cardiomediastinal silhouette is stable. There is streaky atelectasis or infiltrate right basilar and small right pleural effusion. Right arm PICC line with tip in SVC right atrium junction. No pneumothorax. Bronchitic changes right lower lobe. IMPRESSION: Streaky atelectasis or infiltrate right base and small right pleural effusion. Right arm  PICC line with tip in SVC right atrium junction. No pneumothorax. Bronchitic changes right lower lobe. Electronically Signed   By: Natasha Mead M.D.   On: 09/18/2015 12:39     Medications:     Scheduled Medications: . aspirin  81 mg Oral Daily  . digoxin  0.25 mg Oral Daily  . furosemide  60 mg Intravenous BID  . losartan  12.5 mg Oral Daily  . nicotine  21 mg Transdermal Daily  . potassium chloride  20 mEq Oral BID  . sodium chloride flush  10-40 mL Intracatheter Q12H  . spironolactone  25 mg Oral Daily    Infusions: . amiodarone 30 mg/hr (09/19/15 0700)  . heparin 2,000 Units/hr (09/19/15 0700)  . milrinone 0.25 mcg/kg/min (09/19/15 0808)    PRN Medications: guaiFENesin, ipratropium, sodium chloride flush   Assessment:   1. New onset atrial fibrillation with rapid ventricular response:  2. Acute combined systolic and diastolic CHF:   --EF 15% 3. Tobacco abuse:  4. AKI - improving 5. Transaminitis 6. Hypokalemia/hyponatremia  Plan/Discussion:    He has severe acute systolic HF and I suspect it is related to tachy-induced CM related to AF (which likely occurred in setting of recent illness). Co-ox low suggestive of low output physiology and milrinone started 6/10. Co-ox now improved. Starting to diurese but still overloaded. Increase lasix to 80 IV bid. Add mettolazone.  Remains in AF with RVR. Continue IV amio for rate control. Continue digoxin, spiro and losartan. Supp K+. No b-blocker yet  Continue heparin for AF. Will need cath and TEE/DC-CV this week depending on course likely Tuesday/Wednesday.    Length of Stay: 4  Arvilla Meres MD 09/19/2015, 8:19 AM  Advanced Heart Failure Team Pager [phone] (M-F; 7a - 4p)  Please contact [facility] for night-coverage after hours (4p -7a ) and weekends on [email or web address]

## 2015-09-19 NOTE — Progress Notes (Signed)
Received verbal order from Dr. Gala Romney to start IV Milrinone at 0.25 mcg/kg/min as an order set. Repeated back order and received confirmation. Will start Milrinone and continue monitoring patient.

## 2015-09-19 NOTE — Progress Notes (Signed)
Subjective: Overnight, he was started on milrinone at ~2100 last night. Overnight, he complained of feeling clammy, sweating, cough, and sore throat. Declined Tessalon b/c he felt it was too strong. Felt better and went back to sleep after receiving robitussin.   This morning, notes ~1 hr episode of feeling his head buzzing/fuzzy on R side that has resolved. Pt noted it started around when new bag of milrinone was hung. Also complains of fuzzy vision that began ~0.5-1 hr ago while he was watching TV. Doesn't feel as sharp as usual this morning and attributes it to the medications. Denies CP, abd pain, SOB, light-headedness, feeling cold, sweating during the day, loss of appetite, or further cough or sore throat. Felt well walking briefly yesterday.    Objective: Vital signs in last 24 hours: Filed Vitals:   09/19/15 0615 09/19/15 0700 09/19/15 0753 09/19/15 0806  BP:  97/77    Pulse: 67 54  110  Temp:   98.2 F (36.8 C)   TempSrc:   Oral   Resp: 22 24    Height:      Weight:      SpO2: 95% 94%     Weight change: -0.272 kg (-9.6 oz) -2 kg since admission  Intake/Output Summary (Last 24 hours) at 09/19/15 0835 Last data filed at 09/19/15 0700  Gross per 24 hour  Intake 2107.56 ml  Output   2370 ml  Net -262.44 ml   BP 97/77 mmHg  Pulse 110  Temp(Src) 98.2 F (36.8 C) (Oral)  Resp 24  Ht 6' (1.829 m)  Wt 85.639 kg (188 lb 12.8 oz)  BMI 25.60 kg/m2  SpO2 94% General appearance: alert, cooperative, no distress, sitting up in bed eating breakfast, non-edematous face, supp O2 at 3L with N2 in nose. Eyes: No scleral icterus. 20/70 without glasses using both eyes Lungs: Reduced breath sounds compared to prior exam. Intermittent wheeze in RML. Crackles in lower posterior fields bilaterally and RML. Appears comfortable breathing. Heart: irregularly irregular rhythm, no m/r/g; JVD improved compared to prior examine. Visible below jawline when sitting at 90 deg vs. At jawline when  sitting at 90 deg yesterday. Extremities: acyanosis. Wrists and ankles warm. LEs with 2-3+ pretibial and pedal pitting edema 2-3+ bilaterally. Palpable L DP pulses. Unable to palpate R DP or PT pulse.  Neuro: Cranial nerves - PERRLA; EOMI; reduced field of vision in R lateral, lower quarter field; facial strength, movement, and light touch sensation intact and symmetric; tongue and uvula midline; palate raise symmetric; shrug and head turn strength symmetric  Strength - 5/5 strength in UEs and LEs bilaterally. Mild pulsating resistance in triceps bilaterally.  Reflexes - 2+ brachial, could not elicit patellar or Achilles  Light touch sensation - Intact on UEs and LEs  Cerebellar - Finger to nose intact, RAM and heel-to-shin intact  Mentation - Able to hold a conversation, report symptoms and occurrences with correct timeline, and report general information  Lab Results:  Recent Labs Lab 09/17/15 0415 09/18/15 0329 09/19/15 0429  WBC 19.9* 12.3* 9.5  HGB 13.3 12.9* 12.4*  HCT 40.4 39.6 37.9*  PLT 259 219 196    Recent Labs Lab 09/17/15 0415 09/17/15 0814 09/18/15 0329 09/19/15 0429  NA 128*  --  131* 131*  K 4.0  --  3.3* 3.6  CL 93*  --  95* 96*  CO2 20*  --  27 27  BUN 53*  --  39* 29*  CREATININE 1.66*  --  1.17 0.99  CALCIUM 8.6*  --  8.3* 8.1*  PROT 5.9*  --  5.8* 5.2*  BILITOT 1.4* 1.2 1.2 0.9  ALKPHOS 86  --  89 91  ALT 668*  --  527* 349*  AST 489*  --  247* 96*  GLUCOSE 135*  --  108* 96   Medications: I have reviewed the patient's current medications. Scheduled Meds: . aspirin  81 mg Oral Daily  . digoxin  0.25 mg Oral Daily  . furosemide  60 mg Intravenous BID  . losartan  12.5 mg Oral Daily  . nicotine  21 mg Transdermal Daily  . potassium chloride  20 mEq Oral BID  . sodium chloride flush  10-40 mL Intracatheter Q12H  . spironolactone  25 mg Oral Daily   Continuous Infusions: . amiodarone 30 mg/hr (09/19/15 0700)  . heparin 2,000 Units/hr (09/19/15  0700)  . milrinone 0.25 mcg/kg/min (09/19/15 0808)   PRN Meds:.guaiFENesin, ipratropium, sodium chloride flush Assessment/Plan: Principal Problem:   Acute combined systolic and diastolic heart failure (HCC) Active Problems:   Atrial fibrillation with rapid ventricular response (HCC)   Tobacco abuse   Transaminitis   AKI (acute kidney injury) (HCC)   Atrial fibrillation (HCC)   Cardiogenic shock [facility])  Mr. [name] is a 10yoM with a history of tobacco use and 2 weeks of progressive dyspnea was found to be in atrial fibrillation with RVR on 6/7 and CHF with EF 15%. Care was advanced to rhythm control and inotropic/vasodilator therapy 6/10.  #Atrial fibrillation, CHF Most likely 2/2 viral cardiomyopathy, lung disease, or silent MI. Work-up reassuring for PE, MI, infectious, thyroid disease, alcohol, and drugs.Started rhythm control, inotrope, vasodilator, and diuresis with CVP monitoring via PICC on 6/10 with milrinone added overnight. Plan to cath after euvolemic (likely 6/12). Hypervolemic and warm on exam. Improving hepatic and renal labs also support that pt is not in cardiogenic shock. However, pulse pressure 20s-30s. Cerebral perfusion seems adequate given patient is mentating well and only focal deficit on exam is in R eye's lateral/lower quadrant of vision. Inadequate diuresis by I/O and exam. HR within goal. - Continue telemetry - Continue amiodarone via PICC - Continue milrinone via PICC - Continue digoxin 0.25mg  qdaily - Per HF, increase Lasix  to  BID and add metolazone 2.5mg  qdaily for more adequate diuresis - Continue losartan 12.5mg  qdaily and spironolactone  qdaily today. BPs 90s/70s but pt tolerating. - Heparin until cath. Then convert to NOAC. - Per HF consult, inpatient TEE/cardioversion. - Cards outpatient f/u - Echo in 3 months. May consider ICD  # Vision change and mild decrease in R lateral-lower quadrant vision Differential includes side effect of meds  (e.g. Milrinone), low BP, and stroke from emboli vs. Reduced perfusion. No other focal deficits, mentating well, BP 90s/70s ON and this AM. Amiodarone known to cause visual disturbances, including peripheral vision loss, at any time after starting med. If no improvement by tomorrow:  - MRI of head, including orbitals and discuss amiodarone with cardiology.  - If suspicious for optic neuropathy, consider ophtho consult  #Dyspnea, wheezing, cough Dyspnea most likely 2/2 CHF. Comfortable on exam with rales and improved wheezing. Avoid duonebs given tachycardia. Robitussin effective for cough ON. - Ipratropium nebs q8h prn - Stop Tessalon pearls. - Wean O2 as tolerated - Consider outpatient PFTs for COPD  #Hypoalbuminemia 3.1 from 3.7 two days ago. DDx - Poor liver fxn and kidney fxn in context of acute HF, poor po intake from worsening dyspnea over 2 wks, and nephrotic  syndrome. Unlikely due to proteinuria b/c no facial edema. - Continue to monitor - U/a  #Transaminitis Downtrending. At highest, was ALT and AST were 10-11x ULN. 2x ULN on admission. Bili nml, AP, and platelents normal. PT elevated 17.4 at admission. Most likely 2/2 to congestive hepatopathy but DILI and infectious on differential. Acute hep panel negative.  - Daily LFTs  #Mild normocytic anemia Hgb 12.4. Gradual decline from 13.5 on admission. Likely iatrogenic. - Daily CBC   #Hypotonic hyponatremia 131 today up from 126 at lower. Most likely 2/2 heart failure. No history of ingestion to explain borderline high osmolol gap of 11. - Daily BMP  Resolved problems: #R foot pallor, no palpable pulses (found on doppler), and less brisk refill - resolved 6/8 #AKI - Resolved 6/10. Likely 2/2 to dye load, Lasix, and heart failure. Careful with IVF as patient has EF 15% and is hypervolemic.  #Leukocytosis - 9.5 < 12.3 < 19.9 < 8.7 < 11. Elevated abs PMNs and monocytes. Likely 2/2 myocarditis vs. steroids received in ED.  Afebrile.  # Tobacco abuse x30 years. 1 ppd  - Patch 21mg  while inpatient.  #VTE Prophylaxis - Heparin gtt - Transition to NOAC after cath  #FEN/GI - No IVF given hypervolemia. - Hypokalemia 3.6 today. Only daily repletion during diuresis. Goal K 4 and Mg 2. - Daily CBC, BMP, Mg, and LFTs - Heart healthy diet  #Dispo - Likely d/c to home.  - Had been seen by [facility] in past but doesn't necessarily want to follow with them. If wants to follow with [facility], consider [facility].  This is a Psychologist, occupational Note.  The care of the patient was discussed with Dr. Heywood Iles and the assessment and plan formulated with their assistance.  Please see their attached note for official documentation of the daily encounter.   LOS: 4 days   Newton Pigg, Med Student 09/19/2015, 8:35 AM

## 2015-09-20 DIAGNOSIS — F17200 Nicotine dependence, unspecified, uncomplicated: Secondary | ICD-10-CM

## 2015-09-20 LAB — CARBOXYHEMOGLOBIN
CARBOXYHEMOGLOBIN: 1.4 % (ref 0.5–1.5)
METHEMOGLOBIN: 0.9 % (ref 0.0–1.5)
O2 Saturation: 81.8 %
Total hemoglobin: 12.7 g/dL — ABNORMAL LOW (ref 13.5–18.0)

## 2015-09-20 LAB — CBC
HEMATOCRIT: 41.7 % (ref 39.0–52.0)
Hemoglobin: 13.4 g/dL (ref 13.0–17.0)
MCH: 28.8 pg (ref 26.0–34.0)
MCHC: 32.1 g/dL (ref 30.0–36.0)
MCV: 89.5 fL (ref 78.0–100.0)
PLATELETS: 210 10*3/uL (ref 150–400)
RBC: 4.66 MIL/uL (ref 4.22–5.81)
RDW: 14.5 % (ref 11.5–15.5)
WBC: 9.7 10*3/uL (ref 4.0–10.5)

## 2015-09-20 LAB — HEPATIC FUNCTION PANEL
ALBUMIN: 3.5 g/dL (ref 3.5–5.0)
ALT: 311 U/L — AB (ref 17–63)
AST: 78 U/L — AB (ref 15–41)
Alkaline Phosphatase: 101 U/L (ref 38–126)
Bilirubin, Direct: 0.4 mg/dL (ref 0.1–0.5)
Indirect Bilirubin: 0.8 mg/dL (ref 0.3–0.9)
TOTAL PROTEIN: 6.2 g/dL — AB (ref 6.5–8.1)
Total Bilirubin: 1.2 mg/dL (ref 0.3–1.2)

## 2015-09-20 LAB — BASIC METABOLIC PANEL
ANION GAP: 9 (ref 5–15)
BUN: 19 mg/dL (ref 6–20)
CHLORIDE: 91 mmol/L — AB (ref 101–111)
CO2: 30 mmol/L (ref 22–32)
Calcium: 8.9 mg/dL (ref 8.9–10.3)
Creatinine, Ser: 0.97 mg/dL (ref 0.61–1.24)
GFR calc Af Amer: >60 mL/min (ref >60)
GLUCOSE: 90 mg/dL (ref 65–99)
POTASSIUM: 4.6 mmol/L (ref 3.5–5.1)
Sodium: 130 mmol/L — ABNORMAL LOW (ref 135–145)

## 2015-09-20 LAB — HEPARIN LEVEL (UNFRACTIONATED): Heparin Unfractionated: 0.66 IU/mL (ref 0.30–0.70)

## 2015-09-20 LAB — MAGNESIUM: Magnesium: 2.1 mg/dL (ref 1.7–2.4)

## 2015-09-20 MED ORDER — MILRINONE LACTATE IN DEXTROSE 20-5 MG/100ML-% IV SOLN
0.2500 ug/kg/min | INTRAVENOUS | Status: DC
  Administered 2015-09-20 – 2015-09-21 (×2): 0.25 ug/kg/min via INTRAVENOUS
  Filled 2015-09-20 (×2): qty 100

## 2015-09-20 NOTE — Progress Notes (Signed)
[facility] responded to Consult for Advanced Directive. I introduced myself as the Chaplain for this unit and a part of their health care team. The patient indicated the desire for Health Care Power of Attorney and Living  Will. I provided the necessary documents and allowed time for their review of the information. When ready, I called our notary and assembled witnesses for the signing. A brief celebratory prayer was offered at the completion.   Reginald Hahn Gannon Heinzman 2:13 PM    09/20/15 1400  Clinical Encounter Type  Visited With Patient and family together;Health care provider  Visit Type Initial;Spiritual support;Other (Comment) (Advanced Directive)  Referral From Nurse;Care management  Spiritual Encounters  Spiritual Needs Literature;Prayer;Emotional

## 2015-09-20 NOTE — Progress Notes (Signed)
ANTICOAGULATION CONSULT NOTE - Follow Up Consult  Pharmacy Consult for Heparin  Indication: atrial fibrillation  Allergies  Allergen Reactions  . Codeine Itching    whelps  . Other     Seasonal allergies, "all spices, salt, pepper"   Patient Measurements: Height: 6' (182.9 cm) Weight: 178 lb 11.2 oz (81.058 kg) IBW/kg (Calculated) : 77.6  Vital Signs: Temp: 98.3 F (36.8 C) (06/12 1234) Temp Source: Oral (06/12 1234) BP: 136/94 mmHg (06/12 1234) Pulse Rate: 66 (06/12 1234)  Labs:  Recent Labs  09/18/15 0329  09/18/15 2146 09/19/15 0429 09/20/15 0433  HGB 12.9*  --   --  12.4* 13.4  HCT 39.6  --   --  37.9* 41.7  PLT 219  --   --  196 210  HEPARINUNFRC 0.22*  < > 0.50 0.43 0.66  CREATININE 1.17  --   --  0.99 0.97  < > = values in this interval not displayed.  Estimated Creatinine Clearance: 84.4 mL/min (by C-G formula based on Cr of 0.97).  Assessment: 65 year old male continues on heparin for atrial fibrillation.   Heparin level is therapeutic on 2000 units/hr.  Planning for cath and TEE/DCCV this week. H/H is stable. Platelets are starting to trend down - monitor.  No bleeding reported.   Goal of Therapy:  Heparin level 0.3-0.7 units/ml Monitor platelets by anticoagulation protocol: Yes   Plan:  Continue heparin at 2000 units / hr Daily CBC/HL Follow up transition to DOAC after cath  Sheppard Coil PharmD., BCPS Clinical Pharmacist Pager [phone] 09/20/2015 1:00 PM

## 2015-09-20 NOTE — Progress Notes (Signed)
Advanced Heart Failure Rounding Note   Subjective:    65 year old male without prior cardiac history who presented to the ED with progressive dyspnea and fatigue and was found to be in rapid atrial fibrillation after recent flu-like illness. EF found to be 15%  PICC placed yesterday. Co-ox 37%  Started on milrinone 0.25 and IV amio.   Co-ox 81.8% CVP 11-12  Excellent diuresis noted. Renal function WNL.  Feels better today. Breathing has improved.  Had a little lightheadedness walking the halls, but otherwise no complains.   Out 5.2 L and down 10 lbs.  Out an addition 2.9 L so far today (but minimal Intake recorded)  Remains in AF 110s  Objective:   Weight Range:  Vital Signs:   Temp:  [98.1 F (36.7 C)-98.4 F (36.9 C)] 98.3 F (36.8 C) (06/12 1234) Pulse Rate:  [49-107] 66 (06/12 1234) Resp:  [16-26] 16 (06/12 1234) BP: (102-136)/(64-94) 136/94 mmHg (06/12 1234) SpO2:  [93 %-97 %] 97 % (06/12 1234) Weight:  [178 lb 11.2 oz (81.058 kg)] 178 lb 11.2 oz (81.058 kg) (06/12 0513) Last BM Date: 09/20/15  Weight change: Filed Weights   09/18/15 0500 09/19/15 0550 09/20/15 0513  Weight: 189 lb 6.4 oz (85.911 kg) 188 lb 12.8 oz (85.639 kg) 178 lb 11.2 oz (81.058 kg)    Intake/Output:   Intake/Output Summary (Last 24 hours) at 09/20/15 1419 Last data filed at 09/20/15 1244  Gross per 24 hour  Intake    900 ml  Output   6825 ml  Net  -5925 ml     Physical Exam: General:  Sitting in bed.NAD HEENT: normal Neck: supple. JVP 8-9. Carotids 2+ bilat; no bruits. No thyromegaly or nodule noted.  Cor: PMI laterally displaced. Tachy IRR, IRR + s3 Lungs: mild crackles Abdomen: soft, nontender, + mildly distended. No hepatosplenomegaly. No bruits or masses. Good bowel sounds. Extremities: no cyanosis, clubbing, rash, Trace - 1+ edema Neuro: alert & orientedx3, cranial nerves grossly intact. moves all 4 extremities w/o difficulty. Affect pleasant  Telemetry: AF  110s  Labs: Basic Metabolic Panel:  Recent Labs Lab 09/15/15 1558 09/16/15 0218 09/17/15 0415 09/18/15 0329 09/19/15 0429 09/20/15 0433  NA  --  126* 128* 131* 131* 130*  K  --  3.7 4.0 3.3* 3.6 4.6  CL  --  95* 93* 95* 96* 91*  CO2  --  18* 20* GLUCOSE  --  181* 135* 108* 96 90  BUN  --  30* 53* 39* 29* 19  CREATININE  --  1.37* 1.66* 1.17 0.99 0.97  CALCIUM  --  8.7* 8.6* 8.3* 8.1* 8.9  MG 2.1  --   --   --  2.2 2.1    Liver Function Tests:  Recent Labs Lab 09/16/15 0218 09/17/15 0415 09/17/15 0814 09/18/15 0329 09/19/15 0429 09/20/15 0433  AST 118* 489*  --  247* 96* 78*  ALT 181* 668*  --  527* 349* 311*  ALKPHOS 79 86  --  89 91 101  BILITOT 1.3* 1.4* 1.2 1.2 0.9 1.2  PROT 5.9* 5.9*  --  5.8* 5.2* 6.2*  ALBUMIN 3.5 3.7  --  3.4* 3.1* 3.5   No results for input(s): LIPASE, AMYLASE in the last 168 hours. No results for input(s): AMMONIA in the last 168 hours.  CBC:  Recent Labs Lab 09/15/15 1109 09/16/15 0218 09/17/15 0415 09/18/15 0329 09/19/15 0429 09/20/15 0433  WBC 11.4* 8.7 19.9* 12.3* 9.5 9.7  NEUTROABS 9.0*  --  16.9*  --   --   --   HGB 13.5 12.7* 13.3 12.9* 12.4* 13.4  HCT 40.6 38.7* 40.4 39.6 37.9* 41.7  MCV 88.1 87.6 88.4 89.0 90.9 89.5  PLT 236 243 259 219 196 210    Cardiac Enzymes:  Recent Labs Lab 09/15/15 1109 09/15/15 1558 09/15/15 2016 09/16/15 0205 09/17/15 0416  CKTOTAL  --   --   --   --  770*  CKMB  --   --   --   --  32.9*  TROPONINI 0.07* 0.08* 0.07* 0.07*  --     BNP: BNP (last 3 results)  Recent Labs  09/15/15 1109  BNP 919.3*    ProBNP (last 3 results) No results for input(s): PROBNP in the last 8760 hours.    Other results:  Imaging: No results found.   Medications:     Scheduled Medications: . aspirin  81 mg Oral Daily  . digoxin  0.25 mg Oral Daily  . furosemide  80 mg Intravenous BID  . losartan  25 mg Oral Daily  . metolazone  2.5 mg Oral Daily  . nicotine  21 mg  Transdermal Daily  . potassium chloride  40 mEq Oral BID  . sodium chloride flush  10-40 mL Intracatheter Q12H  . spironolactone  25 mg Oral Daily    Infusions: . amiodarone 30 mg/hr (09/20/15 1117)  . heparin 2,000 Units/hr (09/20/15 0404)  . milrinone 0.25 mcg/kg/min (09/20/15 1136)    PRN Medications: guaiFENesin, ipratropium, sodium chloride flush   Assessment:   1. New onset atrial fibrillation with rapid ventricular response:  2. Acute combined systolic and diastolic CHF:   --EF 15% 3. Tobacco abuse:  4. AKI - improving 5. Transaminitis 6. Hypokalemia/hyponatremia  Plan/Discussion:    He has severe acute systolic HF and suspect related to tachy-induced CM related to AF (which likely occurred in setting of recent illness). Continue digoxin, spiro and losartan. Supp K+. No b-blocker yet  Co-ox low suggestive of low output physiology and milrinone started 6/10. Co-ox now improved. Starting to diurese but remains somewhat overloaded.  Continue lasix 80 IV bid with mettolazone today. Consider transition to po tomorrow. Will need cath and TEE/DC-CV once diuresed, tomorrow vs Wednesday.   Remains in AF with RVR. Continue IV amio for rate control. Continue heparin.     Length of Stay: 5  Graciella Freer, [place] 09/20/2015, 2:19 PM  Advanced Heart Failure Team Pager [phone] (M-F; 7a - 4p)  Please contact [facility] for night-coverage after hours (4p -7a ) and weekends on [email or web address]  Patient seen and examined with Otilio Saber, PA-C. We discussed all aspects of the encounter. I agree with the assessment and plan as stated above.   He is improving steadily with milrinone and IV lasix. Co-ox good. CVP still up. Remains in AF with RVR though rate improved with IV amio. Continue diuresis today with milrinone. Probable cath on Wednesday with TEE-DCCV on Thursday. (ideally off milrinone  By then). K climbing will back off on kcl supp. LFTs much improved (shock liver).    Bensimhon, Daniel,MD 7:01 PM

## 2015-09-20 NOTE — Progress Notes (Signed)
Subjective: Reginald Hahn. Had brief episode of abdominal cramping while urinating last night. Slept poorly from thinking about the severity of his condition but denies any questions for the primary team. Denies chest pain, dyspnea, and lightheadedness. Blurry vision resolved yesterday afternoon.   Objective: Vital signs in last 24 hours: Filed Vitals:   09/20/15 0000 09/20/15 0100 09/20/15 0513 09/20/15 0734  BP:  102/77  105/79  Pulse: 90 95  64  Temp:  98.2 F (36.8 C)  98.4 F (36.9 C)  TempSrc:    Oral  Resp: 23 26  26   Height:   6' (1.829 m)   Weight:   81.058 kg (178 lb 11.2 oz)   SpO2: 97% 95%     Weight change: -4.581 kg (-10 lb 1.6 oz)  Intake/Output Summary (Last 24 hours) at 09/20/15 1015 Last data filed at 09/20/15 1002  Gross per 24 hour  Intake   1500 ml  Output   7300 ml  Net  -5800 ml   BP 105/79 mmHg  Pulse 64  Temp(Src) 98.4 F (36.9 C) (Oral)  Resp 26  Ht 6' (1.829 m)  Wt 81.058 kg (178 lb 11.2 oz)  BMI 24.23 kg/m2  SpO2 95% General appearance: alert, cooperative, no distress, comfortable lying in bed and sits up easily, comfortable breathing. Supp O2 at 3-4L with N2 in nose. Lungs: No wheezing. Crackles RML. Otherwise CTAB. No increased work of breathing and converses easily. Heart: Irregularly irregular rhythm. No tachycardic. no m/r/g; JVD intermittently visible when sitting upright ~1/4 up neck - improved from prior exams Extremities: Skin warm. Trace pretibial pitting edema in RLE and 1-2+ pitting edema in LLE.    Lab Results:  Recent Labs Lab 09/18/15 0329 09/19/15 0429 09/20/15 0433  WBC 12.3* 9.5 9.7  HGB 12.9* 12.4* 13.4  HCT 39.6 37.9* 41.7  PLT 219 196 210    Recent Labs Lab 09/18/15 0329 09/19/15 0429 09/20/15 0433  NA 131* 131* 130*  K 3.3* 3.6 4.6  CL 95* 96* 91*  CO2 27 27 30   BUN 39* 29* 19  CREATININE 1.17 0.99 0.97  CALCIUM 8.3* 8.1* 8.9  PROT 5.8* 5.2* 6.2*  BILITOT 1.2 0.9 1.2  ALKPHOS 89 91 101  ALT 527* 349*  311*  AST 247* 96* 78*  GLUCOSE 108* 96 90   Mg 2.1  Medications: I have reviewed the patient's current medications. Scheduled Meds: . aspirin  81 mg Oral Daily  . digoxin  0.25 mg Oral Daily  . furosemide  80 mg Intravenous BID  . losartan  25 mg Oral Daily  . metolazone  2.5 mg Oral Daily  . nicotine  21 mg Transdermal Daily  . potassium chloride  40 mEq Oral BID  . sodium chloride flush  10-40 mL Intracatheter Q12H  . spironolactone  25 mg Oral Daily   Continuous Infusions: . amiodarone 30 mg/hr (09/19/15 2000)  . heparin 2,000 Units/hr (09/20/15 0404)  . milrinone 0.25 mcg/kg/min (09/20/15 0236)   PRN Meds:.guaiFENesin, ipratropium, sodium chloride flush Assessment/Plan: Principal Problem:   Acute combined systolic and diastolic heart failure (HCC) Active Problems:   Atrial fibrillation with rapid ventricular response (HCC)   Tobacco abuse   Transaminitis   AKI (acute kidney injury) (HCC)   Atrial fibrillation (HCC)   Cardiogenic shock [facility])  [name] is a 36yoM with a history of tobacco use and 2 weeks of progressive dyspnea was found to be in atrial fibrillation with RVR on 6/7 and CHF with EF 15%.  Care was advanced to rhythm control and inotropic/vasodilator therapy 6/10.  #Atrial fibrillation, CHF Most likely 2/2 viral cardiomyopathy, tachycardia-induced, lung disease, or silent MI. Started rhythm control, inotrope, vasodilator, and diuresis with CVP monitoring via PICC on 6/10 with milrinone added overnight. Continues to be in atrial fibrillation despite amiodarone. Plan to cath after euvolemic. Warm with improving hypervolemia on exam and mentating well. CVP 13 from 17. Pulse pressure 30s. Improving hepatic and renal labs also support that pt is not in cardiogenic shock. Tolerated (per subjective hx and BP) very successful diuresis yesterday w/ net -5L with corroborating wt loss. HR within goal. - Continue telemetry - Current rhythm and inotropic regimen is  amiodarone and milrinone via PICC and digoxin 0.25mg  qdaily.  - Follow diuresis recs per cards. Yesterday regimen Lasix 80mg  BID and metolazone 2.5mg  qdaily.  - Continue losartan 12.5mg  qdaily and spironolactone 25mg  qdaily. BPs 100-110s/60s-80s.  - Heparin until cath. Then convert to NOAC. - Per HF consult, inpatient TEE/cardioversion. - Cards outpatient f/u - Echo in 3 months. May be ICD candidate.   #Dyspnea, wheezing, cough Dyspnea most likely 2/2 CHF. Avoid duonebs given tachycardia. Robitussin effective for cough. Has not needed breathing treatments. - Ipratropium nebs q8h prn - Wean O2 as tolerated - Consider outpatient PFTs for COPD  #Transaminitis, hypoalbuminemia Downtrending. At highest, was ALT and AST were 10-11x ULN. 2x ULN on admission. Bili nml, AP, and platelents normal. PT elevated 17.4 at admission. Albumin downtrended to 3.1 but up to 3.5 today.  - Daily LFTs  #Hypotonic hyponatremia Stable at 130. Most likely 2/2 heart failure. Unknown baseline - Daily BMP  # Vision change and mild decrease in R lateral-inferior quadrant vision Blurred vision resolved. Continue to monitor in context of BP, risk for optic neuropathy on amiodarone, and risk of stroke per emboli or reduced perfusion from HF.  - If concern for stroke, consider CT/MRI. - If concern for optic neuropathy, discuss with amiodarone w/ cardiology. May need ophtho consult.  # Tobacco abuse x30 years. 1 ppd  - Patch 21mg  while inpatient.  #VTE Prophylaxis - Heparin gtt - Transition to NOAC after cath  #FEN/GI - No IVF given hypervolemia. - Hypokalemia 4.6 today. On repletion given diuresis. Goal K>4 and Mg>2. - Daily CBC, BMP, Mg, and LFTs - Heart healthy diet  #Dispo - Likely d/c to home.  - Will need cardiology and PCP follow-up. - Had been seen by [facility] in past but doesn't necessarily want to follow with them. If wants to follow with [facility], consider [facility].  This is a Psychologist, occupational Note.  The  care of the patient was discussed with Dr. Boykin Peek and the assessment and plan formulated with their assistance.  Please see their attached note for official documentation of the daily encounter.   LOS: 5 days   Newton Pigg, Med Student 09/20/2015, 10:15 AM

## 2015-09-20 NOTE — Progress Notes (Signed)
Subjective:   Day of hospitalization: 5  VSS.  No overnight events.  Denies any chest pain.  States breathing has much improved.  Significant other is concerned about Mr. Austerman hearing.    Objective:   Vital signs in last 24 hours: Filed Vitals:   09/20/15 0000 09/20/15 0100 09/20/15 0513 09/20/15 0734  BP:  102/77  105/79  Pulse: 90 95  64  Temp:  98.2 F (36.8 C)  98.4 F (36.9 C)  TempSrc:    Oral  Resp: 23 26  26   Height:   6' (1.829 m)   Weight:   178 lb 11.2 oz (81.058 kg)   SpO2: 97% 95%      Weight: Filed Weights   09/18/15 0500 09/19/15 0550 09/20/15 0513  Weight: 189 lb 6.4 oz (85.911 kg) 188 lb 12.8 oz (85.639 kg) 178 lb 11.2 oz (81.058 kg)    I/Os:  Intake/Output Summary (Last 24 hours) at 09/20/15 1153 Last data filed at 09/20/15 1100  Gross per 24 hour  Intake   1500 ml  Output   7250 ml  Net  -5750 ml    Physical Exam: Constitutional: Vital signs reviewed.  Patient is sitting up in bed in no acute distress and cooperative with exam.   HEENT: [place]/AT; EOMI. Cardiovascular: Irregular, no JVD.  Pulmonary/Chest: Normal respiratory effort, no accessory muscle use, CTA.  Neurological: A&O x3, CN II-XII grossly intact, moving all extremities.  Ext: 1+ LE edema b/l.   Lab Results:  BMP:  Recent Labs Lab 09/19/15 0429 09/20/15 0433  NA 131* 130*  K 3.6 4.6  CL 96* 91*  CO2 27 30  GLUCOSE 96 90  BUN 29* 19  CREATININE 0.99 0.97  CALCIUM 8.1* 8.9  MG 2.2 2.1    CBC:  Recent Labs Lab 09/15/15 1109  09/17/15 0415  09/19/15 0429 09/20/15 0433  WBC 11.4*  < > 19.9*  < > 9.5 9.7  NEUTROABS 9.0*  --  16.9*  --   --   --   HGB 13.5  < > 13.3  < > 12.4* 13.4  HCT 40.6  < > 40.4  < > 37.9* 41.7  MCV 88.1  < > 88.4  < > 90.9 89.5  PLT 236  < > 259  < > 196 210  < > = values in this interval not displayed.  Coagulation:  Recent Labs Lab 09/15/15 1109  LABPROT 17.4*  INR 1.41    CBG:           No results for input(s): GLUCAP  in the last 168 hours.         HA1C:       Recent Labs Lab 09/15/15 1558  HGBA1C 5.9*    Lipid Panel:  Recent Labs Lab 09/16/15 0218  CHOL 131  HDL 24*  LDLCALC 93  TRIG 71  CHOLHDL 5.5    LFTs:  Recent Labs Lab 09/19/15 0429 09/20/15 0433  AST 96* 78*  ALT 349* 311*  ALKPHOS 91 101  BILITOT 0.9 1.2  PROT 5.2* 6.2*  ALBUMIN 3.1* 3.5    Pancreatic Enzymes: No results for input(s): LIPASE, AMYLASE in the last 168 hours.  Lactic Acid/Procalcitonin: No results for input(s): LATICACIDVEN, PROCALCITON in the last 168 hours.  Ammonia: No results for input(s): AMMONIA in the last 168 hours.  Cardiac Enzymes:  Recent Labs Lab 09/15/15 1558 09/15/15 2016 09/16/15 0205 09/17/15 0416  CKTOTAL  --   --   --  770*  CKMB  --   --   --  32.9*  TROPONINI 0.08* 0.07* 0.07*  --     EKG: EKG Interpretation  Date/Time:  Wednesday September 15 2015 10:09:48 EDT Ventricular Rate:  141 PR Interval:    QRS Duration: 81 QT Interval:  288 QTC Calculation: 441 R Axis:   95 Text Interpretation:  Atrial fibrillation Right axis deviation Anteroseptal infarct, old Nonspecific repol abnormality, diffuse leads agree. no STEMI.NO OLD COMP Confirmed by Donnald Garre, MD, Lebron Conners [phone]) on 09/15/2015 10:31:16 AM   BNP: No results for input(s): PROBNP in the last 168 hours.  D-Dimer: No results for input(s): DDIMER in the last 168 hours.  Urinalysis: No results for input(s): COLORURINE, LABSPEC, PHURINE, GLUCOSEU, HGBUR, BILIRUBINUR, KETONESUR, PROTEINUR, UROBILINOGEN, NITRITE, LEUKOCYTESUR in the last 168 hours.  Invalid input(s): APPERANCEUR  Micro Results: Recent Results (from the past 240 hour(s))  MRSA PCR Screening     Status: None   Collection Time: 09/15/15  4:19 PM  Result Value Ref Range Status   MRSA by PCR NEGATIVE NEGATIVE Final    Comment:        The GeneXpert MRSA Assay (FDA approved for NASAL specimens only), is one component of a comprehensive MRSA  colonization surveillance program. It is not intended to diagnose MRSA infection nor to guide or monitor treatment for MRSA infections.     Blood Culture: No results found for: SDES, SPECREQUEST, CULT, REPTSTATUS  Studies/Results: Dg Chest Port 1 View  09/18/2015  CLINICAL DATA:  Right PICC line placement EXAM: PORTABLE CHEST 1 VIEW COMPARISON:  09/15/2015 FINDINGS: Cardiomediastinal silhouette is stable. There is streaky atelectasis or infiltrate right basilar and small right pleural effusion. Right arm PICC line with tip in SVC right atrium junction. No pneumothorax. Bronchitic changes right lower lobe. IMPRESSION: Streaky atelectasis or infiltrate right base and small right pleural effusion. Right arm PICC line with tip in SVC right atrium junction. No pneumothorax. Bronchitic changes right lower lobe. Electronically Signed   By: Natasha Mead M.D.   On: 09/18/2015 12:39    Medications:  Scheduled Meds: . aspirin  81 mg Oral Daily  . digoxin  0.25 mg Oral Daily  . furosemide  80 mg Intravenous BID  . losartan  25 mg Oral Daily  . metolazone  2.5 mg Oral Daily  . nicotine  21 mg Transdermal Daily  . potassium chloride  40 mEq Oral BID  . sodium chloride flush  10-40 mL Intracatheter Q12H  . spironolactone  25 mg Oral Daily   Continuous Infusions: . amiodarone 30 mg/hr (09/20/15 1117)  . heparin 2,000 Units/hr (09/20/15 0404)  . milrinone 0.25 mcg/kg/min (09/20/15 1136)   PRN Meds: guaiFENesin, ipratropium, sodium chloride flush  Antibiotics: Antibiotics Given (last 72 hours)    None      Day of Hospitalization: 5  Consults: Treatment Team:  Rounding Lbcardiology, MD  Assessment/Plan:   Principal Problem:   Acute combined systolic and diastolic heart failure (HCC) Active Problems:   Atrial fibrillation with rapid ventricular response (HCC)   Tobacco abuse   Transaminitis   AKI (acute kidney injury) (HCC)   Atrial fibrillation (HCC)   Cardiogenic shock  (HCC)  CHF Down 6.3L since admission, wt down.  Breathing improved.  HF team following.   -cont lasix  IV bid, added metolazone -appreciate HF team following   Atrial fibrillation HR controlled, remains in afib.   -heparin gtt for now -cont IV amiodarone, digoxin, spiro, and losartan -cardioversion likely Tues/Wed  Hyponatremia Stable, improved.  Likely from low CO.   -monitor   Elevated transaminases Likely congestive hepatopathy.  Hepatitis panel neg.  -monitor   F/E/N Fluids- None  Electrolytes- Replete as needed  Nutrition- HH diet  VTE PPx  Heparin gtt  Disposition Disposition is deferred, awaiting improvement of current medical problems.  Anticipated discharge in approximately 2-3 day(s).  Social work consult.     LOS: 5 days   Marrian Salvage, MD PGY-3, Internal Medicine Teaching Service 09/20/2015, 11:53 AM

## 2015-09-21 ENCOUNTER — Inpatient Hospital Stay (HOSPITAL_COMMUNITY): Payer: Self-pay

## 2015-09-21 DIAGNOSIS — R58 Hemorrhage, not elsewhere classified: Secondary | ICD-10-CM | POA: Insufficient documentation

## 2015-09-21 DIAGNOSIS — M79652 Pain in left thigh: Secondary | ICD-10-CM

## 2015-09-21 DIAGNOSIS — R578 Other shock: Secondary | ICD-10-CM | POA: Insufficient documentation

## 2015-09-21 DIAGNOSIS — M5442 Lumbago with sciatica, left side: Secondary | ICD-10-CM

## 2015-09-21 DIAGNOSIS — K661 Hemoperitoneum: Secondary | ICD-10-CM

## 2015-09-21 DIAGNOSIS — R579 Shock, unspecified: Secondary | ICD-10-CM

## 2015-09-21 LAB — CBC
HCT: 40.4 % (ref 39.0–52.0)
HEMATOCRIT: 32.5 % — AB (ref 39.0–52.0)
HEMATOCRIT: 26.9 % — AB (ref 39.0–52.0)
Hemoglobin: 13.2 g/dL (ref 13.0–17.0)
Hemoglobin: 10.9 g/dL — ABNORMAL LOW (ref 13.0–17.0)
Hemoglobin: 8.8 g/dL — ABNORMAL LOW (ref 13.0–17.0)
MCH: 28.9 pg (ref 26.0–34.0)
MCH: 29 pg (ref 26.0–34.0)
MCH: 28.4 pg (ref 26.0–34.0)
MCHC: 32.7 g/dL (ref 30.0–36.0)
MCHC: 33.5 g/dL (ref 30.0–36.0)
MCHC: 32.7 g/dL (ref 30.0–36.0)
MCV: 88.6 fL (ref 78.0–100.0)
MCV: 86.4 fL (ref 78.0–100.0)
MCV: 86.8 fL (ref 78.0–100.0)
PLATELETS: 229 10*3/uL (ref 150–400)
PLATELETS: 215 10*3/uL (ref 150–400)
PLATELETS: 204 10*3/uL (ref 150–400)
RBC: 4.56 MIL/uL (ref 4.22–5.81)
RBC: 3.76 MIL/uL — ABNORMAL LOW (ref 4.22–5.81)
RBC: 3.1 MIL/uL — ABNORMAL LOW (ref 4.22–5.81)
RDW: 14.1 % (ref 11.5–15.5)
RDW: 13.9 % (ref 11.5–15.5)
RDW: 14 % (ref 11.5–15.5)
WBC: 14.7 10*3/uL — AB (ref 4.0–10.5)
WBC: 14.3 10*3/uL — AB (ref 4.0–10.5)
WBC: 14.6 10*3/uL — AB (ref 4.0–10.5)

## 2015-09-21 LAB — POCT I-STAT, CHEM 8
BUN: 27 mg/dL — AB (ref 6–20)
CALCIUM ION: 1.02 mmol/L — AB (ref 1.13–1.30)
CREATININE: 1.5 mg/dL — AB (ref 0.61–1.24)
Chloride: 92 mmol/L — ABNORMAL LOW (ref 101–111)
GLUCOSE: 179 mg/dL — AB (ref 65–99)
HEMATOCRIT: 28 % — AB (ref 39.0–52.0)
Hemoglobin: 9.5 g/dL — ABNORMAL LOW (ref 13.0–17.0)
Potassium: 4.8 mmol/L (ref 3.5–5.1)
Sodium: 127 mmol/L — ABNORMAL LOW (ref 135–145)
TCO2: 22 mmol/L (ref 0–100)

## 2015-09-21 LAB — COMPREHENSIVE METABOLIC PANEL
ALBUMIN: 3.1 g/dL — AB (ref 3.5–5.0)
ALT: 198 U/L — ABNORMAL HIGH (ref 17–63)
ANION GAP: 9 (ref 5–15)
AST: 63 U/L — ABNORMAL HIGH (ref 15–41)
Alkaline Phosphatase: 87 U/L (ref 38–126)
BUN: 28 mg/dL — ABNORMAL HIGH (ref 6–20)
CHLORIDE: 90 mmol/L — AB (ref 101–111)
CO2: 26 mmol/L (ref 22–32)
Calcium: 8.4 mg/dL — ABNORMAL LOW (ref 8.9–10.3)
Creatinine, Ser: 1.68 mg/dL — ABNORMAL HIGH (ref 0.61–1.24)
GFR calc Af Amer: 48 mL/min — ABNORMAL LOW (ref >60)
GFR calc non Af Amer: 41 mL/min — ABNORMAL LOW (ref >60)
GLUCOSE: 136 mg/dL — AB (ref 65–99)
POTASSIUM: 5.1 mmol/L (ref 3.5–5.1)
SODIUM: 125 mmol/L — AB (ref 135–145)
Total Bilirubin: 1 mg/dL (ref 0.3–1.2)
Total Protein: 5 g/dL — ABNORMAL LOW (ref 6.5–8.1)

## 2015-09-21 LAB — URINALYSIS, ROUTINE W REFLEX MICROSCOPIC
BILIRUBIN URINE: NEGATIVE
Glucose, UA: NEGATIVE mg/dL
Hgb urine dipstick: NEGATIVE
Ketones, ur: NEGATIVE mg/dL
Leukocytes, UA: NEGATIVE
NITRITE: NEGATIVE
PH: 7.5 (ref 5.0–8.0)
Protein, ur: NEGATIVE mg/dL
SPECIFIC GRAVITY, URINE: 1.008 (ref 1.005–1.030)

## 2015-09-21 LAB — BLOOD GAS, ARTERIAL
Acid-Base Excess: 2.1 mmol/L — ABNORMAL HIGH (ref 0.0–2.0)
BICARBONATE: 25 meq/L — AB (ref 20.0–24.0)
DRAWN BY: 405301
O2 Content: 2 L/min
O2 SAT: 95.8 %
Patient temperature: 98.6
TCO2: 26 mmol/L (ref 0–100)
pCO2 arterial: 31.6 mmHg — ABNORMAL LOW (ref 35.0–45.0)
pH, Arterial: 7.509 — ABNORMAL HIGH (ref 7.350–7.450)
pO2, Arterial: 77 mmHg — ABNORMAL LOW (ref 80.0–100.0)

## 2015-09-21 LAB — APTT: APTT: 62 s — AB (ref 24–37)

## 2015-09-21 LAB — BASIC METABOLIC PANEL
ANION GAP: 12 (ref 5–15)
BUN: 21 mg/dL — AB (ref 6–20)
CALCIUM: 9.4 mg/dL (ref 8.9–10.3)
CO2: 28 mmol/L (ref 22–32)
Chloride: 86 mmol/L — ABNORMAL LOW (ref 101–111)
Creatinine, Ser: 0.99 mg/dL (ref 0.61–1.24)
GFR calc Af Amer: >60 mL/min (ref >60)
GLUCOSE: 139 mg/dL — AB (ref 65–99)
Potassium: 4.5 mmol/L (ref 3.5–5.1)
SODIUM: 126 mmol/L — AB (ref 135–145)

## 2015-09-21 LAB — CARBOXYHEMOGLOBIN
Carboxyhemoglobin: 2 % — ABNORMAL HIGH (ref 0.5–1.5)
Methemoglobin: 0.7 % (ref 0.0–1.5)
O2 Saturation: 69.7 %
TOTAL HEMOGLOBIN: 14.6 g/dL (ref 13.5–18.0)

## 2015-09-21 LAB — PROTIME-INR
INR: 1.64 — AB (ref 0.00–1.49)
PROTHROMBIN TIME: 19.4 s — AB (ref 11.6–15.2)

## 2015-09-21 LAB — HEPARIN LEVEL (UNFRACTIONATED): HEPARIN UNFRACTIONATED: 0.83 [IU]/mL — AB (ref 0.30–0.70)

## 2015-09-21 LAB — FIBRINOGEN: Fibrinogen: 269 mg/dL (ref 204–475)

## 2015-09-21 LAB — ABO/RH: ABO/RH(D): B POS

## 2015-09-21 LAB — LACTIC ACID, PLASMA: Lactic Acid, Venous: 1 mmol/L (ref 0.5–2.0)

## 2015-09-21 LAB — PREPARE RBC (CROSSMATCH)

## 2015-09-21 LAB — MAGNESIUM: Magnesium: 1.9 mg/dL (ref 1.7–2.4)

## 2015-09-21 LAB — POCT ACTIVATED CLOTTING TIME: Activated Clotting Time: 125 seconds

## 2015-09-21 MED ORDER — SODIUM CHLORIDE 0.9% FLUSH
3.0000 mL | INTRAVENOUS | Status: DC | PRN
  Administered 2015-09-25: 3 mL via INTRAVENOUS
  Filled 2015-09-21: qty 3

## 2015-09-21 MED ORDER — SODIUM CHLORIDE 0.9 % IV SOLN: Freq: Once | INTRAVENOUS | Status: AC

## 2015-09-21 MED ORDER — SODIUM CHLORIDE 0.9 % IV BOLUS (SEPSIS): 500.0000 mL | Freq: Once | INTRAVENOUS | Status: DC

## 2015-09-21 MED ORDER — FENTANYL CITRATE (PF) 100 MCG/2ML IJ SOLN
50.0000 ug | INTRAMUSCULAR | Status: DC | PRN
  Administered 2015-09-21 – 2015-09-27 (×14): 50 ug via INTRAVENOUS
  Filled 2015-09-21 (×14): qty 2

## 2015-09-21 MED ORDER — SODIUM CHLORIDE 0.9 % IV SOLN: 250.0000 mL | INTRAVENOUS | Status: DC

## 2015-09-21 MED ORDER — MILRINONE LACTATE IN DEXTROSE 20-5 MG/100ML-% IV SOLN: 0.1250 ug/kg/min | INTRAVENOUS | Status: DC

## 2015-09-21 MED ORDER — ONDANSETRON HCL 4 MG/2ML IJ SOLN
4.0000 mg | Freq: Four times a day (QID) | INTRAMUSCULAR | Status: DC | PRN
  Administered 2015-09-21 – 2015-09-24 (×5): 4 mg via INTRAVENOUS
  Filled 2015-09-21 (×4): qty 2

## 2015-09-21 MED ORDER — CYCLOBENZAPRINE HCL 10 MG PO TABS
5.0000 mg | ORAL_TABLET | Freq: Three times a day (TID) | ORAL | Status: DC | PRN
  Administered 2015-09-21 – 2015-09-22 (×2): 5 mg via ORAL
  Filled 2015-09-21 (×2): qty 1

## 2015-09-21 MED ORDER — PROTAMINE SULFATE 10 MG/ML IV SOLN
45.0000 mg | INTRAVENOUS | Status: DC
  Filled 2015-09-21: qty 4.5

## 2015-09-21 MED ORDER — SODIUM CHLORIDE 0.9 % IV BOLUS (SEPSIS)
1000.0000 mL | Freq: Once | INTRAVENOUS | Status: AC
  Administered 2015-09-21: 1000 mL via INTRAVENOUS

## 2015-09-21 MED ORDER — CYCLOBENZAPRINE HCL 10 MG PO TABS
5.0000 mg | ORAL_TABLET | Freq: Three times a day (TID) | ORAL | Status: DC | PRN
  Administered 2015-09-21: 5 mg via ORAL
  Filled 2015-09-21: qty 1

## 2015-09-21 MED ORDER — SODIUM CHLORIDE 0.9 % IV BOLUS (SEPSIS)
1000.0000 mL | Freq: Once | INTRAVENOUS | Status: AC
Start: 2015-09-21 — End: 2015-09-21
  Administered 2015-09-21: 1000 mL via INTRAVENOUS

## 2015-09-21 MED ORDER — ONDANSETRON HCL 4 MG/2ML IJ SOLN
INTRAMUSCULAR | Status: AC
  Filled 2015-09-21: qty 2

## 2015-09-21 MED ORDER — VASOPRESSIN 20 UNIT/ML IV SOLN
0.3000 [IU]/min | INTRAVENOUS | Status: DC
  Administered 2015-09-21 – 2015-09-22 (×3): 0.3 [IU]/min via INTRAVENOUS
  Filled 2015-09-21 (×4): qty 5

## 2015-09-21 MED ORDER — INSULIN ASPART 100 UNIT/ML ~~LOC~~ SOLN
0.0000 [IU] | SUBCUTANEOUS | Status: DC
  Administered 2015-09-22 (×2): 2 [IU] via SUBCUTANEOUS
  Administered 2015-09-22: 1 [IU] via SUBCUTANEOUS
  Administered 2015-09-22: 3 [IU] via SUBCUTANEOUS
  Administered 2015-09-22: 2 [IU] via SUBCUTANEOUS
  Administered 2015-09-22: 3 [IU] via SUBCUTANEOUS
  Administered 2015-09-23 – 2015-09-25 (×4): 1 [IU] via SUBCUTANEOUS

## 2015-09-21 MED ORDER — ACETAMINOPHEN 500 MG PO TABS
1000.0000 mg | ORAL_TABLET | Freq: Four times a day (QID) | ORAL | Status: DC | PRN
  Administered 2015-09-21 – 2015-09-27 (×4): 1000 mg via ORAL
  Filled 2015-09-21 (×4): qty 2

## 2015-09-21 MED ORDER — SODIUM CHLORIDE 0.9% FLUSH
3.0000 mL | Freq: Two times a day (BID) | INTRAVENOUS | Status: DC
  Administered 2015-09-24 – 2015-09-25 (×3): 3 mL via INTRAVENOUS

## 2015-09-21 MED ORDER — SODIUM CHLORIDE 0.9 % IV BOLUS (SEPSIS)
500.0000 mL | Freq: Once | INTRAVENOUS | Status: AC
  Administered 2015-09-21: 500 mL via INTRAVENOUS

## 2015-09-21 MED ORDER — SODIUM CHLORIDE 0.9 % IV SOLN
INTRAVENOUS | Status: AC
  Administered 2015-09-21: 23:00:00 via INTRAVENOUS

## 2015-09-21 NOTE — Progress Notes (Signed)
Patient ID: Reginald Hahn, male   DOB: January 29, 1951, 65 y.o.   MRN: [id number]  The 5W nursing staff paged [name] at 8pm regarding Reginald Hahn's abdominal pain, flank pain, and hypotension.  Last night, he began having right posterior leg pain that has since radiated to his groin and right flank. During this time, his systolic pressures have dropped from the 120s to 60s, his hemoglobin has dropped from 13 to 11 today, and he is fully anticoagulated on heparin for atrial fibrillation. Earlier today, he had a CVP of 3, so it was presumed he was over-diuresed; his milrinone and diuretics were held and he was given 1.5L NS; however, he remains hypotensive and his pain continues to worsen. His urinalysis did not show hematuria nor bacteria.  At this point, I'm mostly concerned about a retroperitoneal bleed or worse an abdominal aortic aneurysm. We will stop his heparin, bolus him 1L NS, and check a stat non-contrast abdominal CT.

## 2015-09-21 NOTE — Significant Event (Signed)
Rapid Response Event Note  Overview: Time Called: 1740 Arrival Time: 1745 Event Type: Other (Comment), Hypotension  Initial Focused Assessment: Patient is alert and oriented, he states that he feels fatigued, confused and restless.  He complains of right lower quadrant pain 10/10, he jumps with any pressure on his right abdomen.   BP 79/53  AF 98  RR 30 O2 sats 100%   Interventions: 1L NS bolus Dr Allena Katz at bedside to assess patient BP  56/31 - 69/47, doppler SBP 82 Labs sent ABG done  Foley placed about 250cc concentrated urine Fluid bolus complete BP 93/54 Patient continues to complain of abdominal pain, and restlessness Updated MD on patient status.  Plan of Care (if not transferred):  Event Summary: Name of Physician Notified: Dr Allena Katz at 1800    at          Marcellina Millin

## 2015-09-21 NOTE — Progress Notes (Signed)
Called Rapid response nurse to evaluate pt, no improvement in cond. Will cont to monitor. Pt very uncomfortable and in pain. Family very anxious at bedside. Emelda Brothers RN

## 2015-09-21 NOTE — Progress Notes (Signed)
Advanced Heart Failure Rounding Note   Subjective:    65 year old male without prior cardiac history who presented to the ED with progressive dyspnea and fatigue and was found to be in rapid atrial fibrillation after recent flu-like illness. EF found to be 15%  Initial Co-ox 37%  Started on milrinone 0.25 and IV amio.   Todays CO-OX is 70% on milrinone 0.25 mcg. CVP 2-3. Weight coming down.   Complaining of L groin pain and back pain. Denies SOB/CP    Objective:   Weight Range:  Vital Signs:   Temp:  [97 F (36.1 C)-98.6 F (37 C)] 97 F (36.1 C) (06/13 0838) Pulse Rate:  [45-107] 58 (06/13 0832) Resp:  [16-27] 27 (06/13 0832) BP: (96-136)/(62-94) 99/72 mmHg (06/13 0832) SpO2:  [90 %-97 %] 92 % (06/13 0832) Weight:  [171 lb 9.6 oz (77.837 kg)] 171 lb 9.6 oz (77.837 kg) (06/13 0430) Last BM Date: 09/20/15  Weight change: Filed Weights   09/19/15 0550 09/20/15 0513 09/21/15 0430  Weight: 188 lb 12.8 oz (85.639 kg) 178 lb 11.2 oz (81.058 kg) 171 lb 9.6 oz (77.837 kg)    Intake/Output:   Intake/Output Summary (Last 24 hours) at 09/21/15 1216 Last data filed at 09/21/15 0011  Gross per 24 hour  Intake   1479 ml  Output   4945 ml  Net  -3466 ml     Physical Exam: CVP 2-3  General:  In bed. NAD HEENT: normal Neck: supple. JVP flat. Carotids 2+ bilat; no bruits. No thyromegaly or nodule noted.  Cor: PMI laterally displaced. Tachy IRR, IRR + s3 Lungs: mild crackles Abdomen: soft, nontender, + mildly distended. No hepatosplenomegaly. No bruits or masses. Good bowel sounds. Extremities: no cyanosis, clubbing, rash, edema R groin heat pack.  Neuro: alert & orientedx3, cranial nerves grossly intact. moves all 4 extremities w/o difficulty. Affect pleasant  Telemetry: AF 90-110s  Labs: Basic Metabolic Panel:  Recent Labs Lab 09/15/15 1558  09/17/15 0415 09/18/15 0329 09/19/15 0429 09/20/15 0433 09/21/15 0445 09/21/15 0855  NA  --   < > 128* 131* 131* 130*  126*  --   K  --   < > 4.0 3.3* 3.6 4.6 4.5  --   CL  --   < > 93* 95* 96* 91* 86*  --   CO2  --   < > 20* 27 27 30 28   --   GLUCOSE  --   < > 135* 108* 96 90 139*  --   BUN  --   < > 53* 39* 29* 19 21*  --   CREATININE  --   < > 1.66* 1.17 0.99 0.97 0.99  --   CALCIUM  --   < > 8.6* 8.3* 8.1* 8.9 9.4  --   MG 2.1  --   --   --  2.2 2.1  --  1.9  < > = values in this interval not displayed.  Liver Function Tests:  Recent Labs Lab 09/16/15 0218 09/17/15 0415 09/17/15 0814 09/18/15 0329 09/19/15 0429 09/20/15 0433  AST 118* 489*  --  247* 96* 78*  ALT 181* 668*  --  527* 349* 311*  ALKPHOS 79 86  --  89 91 101  BILITOT 1.3* 1.4* 1.2 1.2 0.9 1.2  PROT 5.9* 5.9*  --  5.8* 5.2* 6.2*  ALBUMIN 3.5 3.7  --  3.4* 3.1* 3.5   No results for input(s): LIPASE, AMYLASE in the last 168 hours. No results  for input(s): AMMONIA in the last 168 hours.  CBC:  Recent Labs Lab 09/15/15 1109  09/17/15 0415 09/18/15 0329 09/19/15 0429 09/20/15 0433 09/21/15 0855  WBC 11.4*  < > 19.9* 12.3* 9.5 9.7 14.7*  NEUTROABS 9.0*  --  16.9*  --   --   --   --   HGB 13.5  < > 13.3 12.9* 12.4* 13.4 13.2  HCT 40.6  < > 40.4 39.6 37.9* 41.7 40.4  MCV 88.1  < > 88.4 89.0 90.9 89.5 88.6  PLT 236  < > 259 219 196 210 229  < > = values in this interval not displayed.  Cardiac Enzymes:  Recent Labs Lab 09/15/15 1109 09/15/15 1558 09/15/15 2016 09/16/15 0205 09/17/15 0416  CKTOTAL  --   --   --   --  770*  CKMB  --   --   --   --  32.9*  TROPONINI 0.07* 0.08* 0.07* 0.07*  --     BNP: BNP (last 3 results)  Recent Labs  09/15/15 1109  BNP 919.3*    ProBNP (last 3 results) No results for input(s): PROBNP in the last 8760 hours.    Other results:  Imaging: No results found.   Medications:     Scheduled Medications: . aspirin  81 mg Oral Daily  . digoxin  0.25 mg Oral Daily  . furosemide  80 mg Intravenous BID  . losartan  25 mg Oral Daily  . metolazone  2.5 mg Oral Daily  .  nicotine  21 mg Transdermal Daily  . potassium chloride  40 mEq Oral BID  . sodium chloride flush  10-40 mL Intracatheter Q12H  . spironolactone  25 mg Oral Daily    Infusions: . amiodarone 30 mg/hr (09/21/15 0141)  . heparin 1,800 Units/hr (09/21/15 0825)  . milrinone 0.25 mcg/kg/min (09/21/15 0830)    PRN Medications: acetaminophen, cyclobenzaprine, guaiFENesin, ipratropium, sodium chloride flush   Assessment:   1. New onset atrial fibrillation with rapid ventricular response:  2. Acute combined systolic and diastolic CHF:   --EF 15% 3. Tobacco abuse:  4. AKI - improving 5. Transaminitis 6. Hypokalemia/hyponatremia 7. R groin pain  Plan/Discussion:    He has severe acute systolic HF and suspect related to tachy-induced CM related to AF (which likely occurred in setting of recent illness). Continue digoxin, spiro and losartan.  No b-blocker yet  Todays CO-OX 70%. CVP 2-3 . Stop IV lasix + metolazone. Cut back milrinone to 0.125 mcg. Plan for RHC/LHC 6/14 morning.   Remains in AF with RVR. Continue IV amio for rate control. Continue heparin.  Plan for TEE-DC/CV on Thursday 6/15.   Consult PT   Length of Stay: 6  Amy Clegg, NP-C 09/21/2015, 12:16 PM  Advanced Heart Failure Team Pager [phone] (M-F; 7a - 4p)  Please contact [facility] for night-coverage after hours (4p -7a ) and weekends on [email or web address]   Patient seen and examined with Tonye Becket, NP. We discussed all aspects of the encounter. I agree with the assessment and plan as stated above.   Patient developed worsening back and groin pain this evening. CT ab/pelvis reviewed. Shows very large RP bleed. Moved to ICU. SBP in 60s. Received 2.5 L IVF. SBP up to 105. Hgb 13 -> 9.5. ACT checked in ICU = 125 so protamine not given. CVP 3-4. Will continue to support with IVF and RBCs. Follow CBC. Add vasopressin if hypotension persists.   Stop milrinone. Continue amiodarone. Obviously will cancel cath  and TEE/DCCV.  Discussed with Dr. Jamison Neighbor and IM team at bedside. I also discussed with his family.  The patient is critically ill with multiple organ systems failure and requires high complexity decision making for assessment and support, frequent evaluation and titration of therapies, application of advanced monitoring technologies and extensive interpretation of multiple databases.   Critical Care Time devoted to patient care services described in this note is 60 Minutes.   Soma Bachand,MD 10:09 PM

## 2015-09-21 NOTE — Progress Notes (Signed)
  Called by nursing staff for hypotension. 74//53. CVP 1.   Milrinone cut back to 0.125 mc earlier. Stop milrinone.  Holding lasix for now. Give 500 cc NS bolus.   Also complaining of R groin/flank pain not relieved with flexeril. UA negative. Has a history sciatica. Give additional pain medication once BP improves.   Primary team aware.   Amy Clegg NP-C  3:27 PM   Agree with above.   Primary team contacted and obtained stat CT ab/pelvis with large RP bleed. Heparin stopped and moved to ICU. See my addendum to this morning's note for more details.   Erman Thum,MD 10:11 PM

## 2015-09-21 NOTE — Progress Notes (Signed)
Spoke with Garnette Czech about unrelieved pain to right side. Tylenol given without relief. MD to order flexeril. Will cont to monitor. Emelda Brothers RN

## 2015-09-21 NOTE — Progress Notes (Signed)
Subjective: This morning, he is complaining of left thigh/hip/back pain that started overnight. He describes it as a burning pain that was not alleviated with Tylenol or Flexeril overnight. He has never experienced this pain before. He did not ambulate much yesterday out of bed.   Objective: Vital signs in last 24 hours: Filed Vitals:   09/21/15 0007 09/21/15 0430 09/21/15 0832 09/21/15 0838  BP: 104/70 121/62 99/72   Pulse: 90 97 58   Temp: 98.5 F (36.9 C) 98.6 F (37 C)  97 F (36.1 C)  TempSrc: Oral Oral  Axillary  Resp: 26 23 27    Height:      Weight:  171 lb 9.6 oz (77.837 kg)    SpO2: 93% 95% 92%    Weight change: -7 lb 1.6 oz (-3.221 kg)  Intake/Output Summary (Last 24 hours) at 09/21/15 1308 Last data filed at 09/21/15 0011  Gross per 24 hour  Intake   2319 ml  Output   6995 ml  Net  -4676 ml   General: laying in bed, incredibly hard of hearing HEENT: mild JVD noted, no scleral icterus Cardiac: Irregularly regular rhythm, no murmurs appreciated Pulm: clear to auscultation bilaterally Ext: tightness noted over the left thigh, unable to flex left hip actively though possible to do so passively, left leg elevation produces some pain in her back Abdomen: normoactive bowel sounds, initially tender to palpation of lower quadrants though tenderness decreases on distraction Neuro: moving all extremities freely, answers all questions appropriately  Lab Results: Basic Metabolic Panel:  Recent Labs Lab 09/19/15 0429 09/20/15 0433 09/21/15 0445  NA 131* 130* 126*  K 3.6 4.6 4.5  CL 96* 91* 86*  CO2 27 30 28   GLUCOSE 96 90 139*  BUN 29* 19 21*  CREATININE 0.99 0.97 0.99  CALCIUM 8.1* 8.9 9.4  MG 2.2 2.1  --    Liver Function Tests:  Recent Labs Lab 09/19/15 0429 09/20/15 0433  AST 96* 78*  ALT 349* 311*  ALKPHOS 91 101  BILITOT 0.9 1.2  PROT 5.2* 6.2*  ALBUMIN 3.1* 3.5   CBC:  Recent Labs Lab 09/15/15 1109  09/17/15 0415  09/19/15 0429  09/20/15 0433  WBC 11.4*  < > 19.9*  < > 9.5 9.7  NEUTROABS 9.0*  --  16.9*  --   --   --   HGB 13.5  < > 13.3  < > 12.4* 13.4  HCT 40.6  < > 40.4  < > 37.9* 41.7  MCV 88.1  < > 88.4  < > 90.9 89.5  PLT 236  < > 259  < > 196 210  < > = values in this interval not displayed. Cardiac Enzymes:  Recent Labs Lab 09/15/15 1558 09/15/15 2016 09/16/15 0205 09/17/15 0416  CKTOTAL  --   --   --  770*  CKMB  --   --   --  32.9*  TROPONINI 0.08* 0.07* 0.07*  --    Hemoglobin A1C:  Recent Labs Lab 09/15/15 1558  HGBA1C 5.9*   Fasting Lipid Panel:  Recent Labs Lab 09/16/15 0218  CHOL 131  HDL 24*  LDLCALC 93  TRIG 71  CHOLHDL 5.5   Thyroid Function Tests:  Recent Labs Lab 09/15/15 1558  TSH 1.387   Coagulation:  Recent Labs Lab 09/15/15 1109  LABPROT 17.4*  INR 1.41   Urine Drug Screen: Drugs of Abuse     Component Value Date/Time   LABOPIA NONE DETECTED 09/15/2015 2004  COCAINSCRNUR NONE DETECTED 09/15/2015 2004   LABBENZ POSITIVE* 09/15/2015 2004   AMPHETMU NONE DETECTED 09/15/2015 2004   THCU POSITIVE* 09/15/2015 2004   LABBARB NONE DETECTED 09/15/2015 2004    Micro Results: Recent Results (from the past 240 hour(s))  MRSA PCR Screening     Status: None   Collection Time: 09/15/15  4:19 PM  Result Value Ref Range Status   MRSA by PCR NEGATIVE NEGATIVE Final    Comment:        The GeneXpert MRSA Assay (FDA approved for NASAL specimens only), is one component of a comprehensive MRSA colonization surveillance program. It is not intended to diagnose MRSA infection nor to guide or monitor treatment for MRSA infections.    Studies/Results: No results found. Medications: I have reviewed the patient's current medications. Scheduled Meds: . aspirin  81 mg Oral Daily  . digoxin  0.25 mg Oral Daily  . furosemide  80 mg Intravenous BID  . losartan  25 mg Oral Daily  . metolazone  2.5 mg Oral Daily  . nicotine  21 mg Transdermal Daily  . potassium  chloride  40 mEq Oral BID  . sodium chloride flush  10-40 mL Intracatheter Q12H  . spironolactone  25 mg Oral Daily   Continuous Infusions: . amiodarone 30 mg/hr (09/21/15 0141)  . heparin 1,800 Units/hr (09/21/15 0825)  . milrinone 0.25 mcg/kg/min (09/21/15 0830)   PRN Meds:.acetaminophen, guaiFENesin, ipratropium, sodium chloride flush Assessment/Plan:  Mr. Kathol is a 65 year old man with tobacco use hospitalized for atrial fibrillation with RVR found to have heart failure with reduced ejection fraction now with left thigh/back pain.  Heart failure with reduced ejection fraction complicated by acute kidney injury and ischemic hepatitis: Likely mediated by arrhythmia though he does have risk factors for ischemic disease as evident by atherosclerosis of the thoracic aorta on CT imaging. Echo 09/15/68 notable for EF 15%, severe diffuse hypokinesis, mid-moderate tricuspid regurg, severe RA dilatation, mild LA dilatation. Plan for cardiac catheterization tomorrow and TEE/DCCV thereafter. . -Continue Lasix 80 mg IV twice daily -Continue aspirin 81mg  daily -Continue digoxin 0.25mg  daily -Continue spironolactone 25mg  daily -Continue losartan 25mg  daily -Continue milrinone gtt per Heart Failure recommendations  New onset atrial fibrillation: Suspicion still remains for viral myocarditis given cardiomegaly on initial chest x-ray in the absence of other causes by workup so far. Heart rate trending 45-107 over the last 24 hours. -Continue telemetry -Continue amiodarone gtt -Continue heparin IV with plan to transition to oral anticoagulant following cardiac catheterization  Left thigh/back pain: Suspect muscle cramp in the setting of aggressive diuresis though he does acknowledge a history of sciatica. -Consult PT/OT for stretching exercises -Ordered K pad  -Check urinalysis to r/o muscle injury  Hyponatremia: Suspect initially hypotonic hypervolemic hyponatremia. Urine osmolality over 100  which is consistent with ADH secretions though urine sodium undetectable which is consistent with retention in the setting of decreased effective arterial volume. Sodium 126, down from 130 yesterday and 128 on admission with IV diuresis, so maybe a component of hypovolemia now.  -Lasix IV and metolazone as noted above  #FEN:  -Diet: Heart healthy/carb modified  #DVT prophylaxis: Heparin IV  #CODE STATUS: FULL CODE -Confirmed with patient on admission  Dispo: Disposition is deferred at this time, awaiting improvement of current medical problems.  .   The patient does not have a current PCP (No primary care provider on file.) and does need an [facility] hospital follow-up appointment after discharge.  The patient does not have transportation limitations  that hinder transportation to clinic appointments.  .Services Needed at time of discharge: Y = Yes, Blank = No PT:   OT:   RN:   Equipment:   Other:     LOS: 6 days   Beather Arbour, MD 09/21/2015, 8:42 AM

## 2015-09-21 NOTE — Significant Event (Signed)
Rapid Response Event Note  Overview: Time Called: 1740 Arrival Time: 1745 Event Type: Other (Comment), Hypotension  Initial Focused Assessment: Confusion, abd pain, and hypotensive   Interventions: NS bolus  Plan of Care (if not transferred):  Event Summary: Name of Physician Notified: Dr Allena Katz at 1800  Report was received from off going RRT RN. Patients chart was reviewed. Patient is currently receiving a NS bolus for BP's in the 60's-70's. Lab results were noted. On-call provider was made aware of labs and our concern of patients abd pain. A CT of the abd was requested. Patient was taken to CT.  At 2125. Patient was taken to 73m13 for treatment continued treatment of hypotension. Dr. Milas Kocher was also at bedside.        Reginald Hahn

## 2015-09-21 NOTE — Consult Note (Addendum)
PULMONARY / CRITICAL CARE MEDICINE   Name: Reginald Hahn MRN: [id number] DOB: 1950-08-24    ADMISSION DATE:  09/15/2015 CONSULTATION DATE:  09/21/2015  REFERRING MD:  Vilinda Boehringer, M.D. / IMTS  CHIEF COMPLAINT:  Hypotension  HISTORY OF PRESENT ILLNESS:  Patient currently on internal medicine teaching service with atrial fibrillation on systemic anticoagulation with heparin infusion as well as amiodarone. Patient previously also on milrinone. Patient developed retroperitoneal hematoma resulting in hypotension and was subsequently transferred to the intensive care unit this evening. Heparin infusion was stopped. Dr. Gala Romney was at bedside and requested consultation from our service. Patient at this time denies any difficulty breathing or chest pain/pressure. He denies any nausea but is reporting diffuse abdominal pain. No headache or vision changes. Patient has been bolused IV fluids and started on vasopressor infusion while awaiting blood products.  PAST MEDICAL HISTORY :  Past Medical History  Diagnosis Date  . Allergy     seasonal allergies  . Arthritis   . Tobacco use     x40 years. 1 ppd  . Acute combined systolic and diastolic heart failure (HCC) 09/16/2015    PAST SURGICAL HISTORY: Past Surgical History  Procedure Laterality Date  . Appendectomy      Mid 1960s [childhood]  . Vasectomy      Allergies  Allergen Reactions  . Codeine Itching    whelps  . Other     Seasonal allergies, "all spices, salt, pepper"    No current facility-administered medications on file prior to encounter.   No current outpatient prescriptions on file prior to encounter.    FAMILY HISTORY:  Family History  Problem Relation Age of Onset  . Heart disease Mother     died in her 72's.  . Prostate cancer Father     died in his 80's.  . Congenital heart disease Brother     died @ age 70.    SOCIAL HISTORY: Social History  Substance Use Topics  . Smoking status: Current Every Day  Smoker -- 1.00 packs/day for 40 years    Types: Cigarettes  . Smokeless tobacco: None     Comment: Initially told staff that he had only been smoking for 10 yrs but now says on and off for all of his life.  . Alcohol Use: No    REVIEW OF SYSTEMS:  A pertinent 14 point review of systems is negative except as per the history of presenting illness.  SUBJECTIVE: As above.  VITAL SIGNS: BP 73/48 mmHg  Pulse 116  Temp(Src) 96.8 F (36 C) (Axillary)  Resp 28  Ht 6' (1.829 m)  Wt 171 lb 9.6 oz (77.837 kg)  BMI 23.27 kg/m2  SpO2 100%  HEMODYNAMICS: CVP:  [10 mmHg] 10 mmHg  VENTILATOR SETTINGS:    INTAKE / OUTPUT: I/O last 3 completed shifts: In: 2319 [P.O.:1800; I.V.:519] Out: 6995 [Urine:6995]  PHYSICAL EXAMINATION: General:  Awake. Alert. No acute distress. Laying recumbent in bed.  Integument:  Warm & dry. No rash on exposed skin.  Lymphatics:  No appreciated cervical or supraclavicular lymphadenoapthy. HEENT: Dry mucus membranes. No oral ulcers. No scleral injection or icterus.  Cardiovascular:  Slightly tachycardic. No edema. No appreciable JVD.  Pulmonary:  Good aeration & clear to auscultation bilaterally. Normal work of breathing on nasal cannula oxygen. Abdomen: Soft. Normal bowel sounds. Protuberant. Diffusely tender to palpation. Musculoskeletal:  Normal bulk and tone. No joint deformity or effusion appreciated. Neurological:  CN 2-12 grossly in tact. No meningismus. Moving all 4 extremities  equally.  Psychiatric:  Mood and affect congruent. Speech normal rhythm, rate & tone. Alert and oriented 3. LABS:  BMET  Recent Labs Lab 09/20/15 0433 09/21/15 0445 09/21/15 1924 09/21/15 2145  NA 130* 126* 125* 127*  K 4.6 4.5 5.1 4.8  CL 91* 86* 90* 92*  CO2 30 28 26   --   BUN 19 21* 28* 27*  CREATININE 0.97 0.99 1.68* 1.50*  GLUCOSE 90 139* 136* 179*    Electrolytes  Recent Labs Lab 09/19/15 0429 09/20/15 0433 09/21/15 0445 09/21/15 0855 09/21/15 1924   CALCIUM 8.1* 8.9 9.4  --  8.4*  MG 2.2 2.1  --  1.9  --     CBC  Recent Labs Lab 09/21/15 0855 09/21/15 1924 09/21/15 2140 09/21/15 2145  WBC 14.7* 14.3* 14.6*  --   HGB 13.2 10.9* 8.8* 9.5*  HCT 40.4 32.5* 26.9* 28.0*  PLT 229 215 204  --     Coag's  Recent Labs Lab 09/15/15 1109  INR 1.41    Sepsis Markers  Recent Labs Lab 09/21/15 1925  LATICACIDVEN 1.0    ABG  Recent Labs Lab 09/15/15 1248 09/21/15 1916  PHART 7.463* 7.509*  PCO2ART 30.0* 31.6*  PO2ART 79.0* 77.0*    Liver Enzymes  Recent Labs Lab 09/19/15 0429 09/20/15 0433 09/21/15 1924  AST 96* 78* 63*  ALT 349* 311* 198*  ALKPHOS 91 101 87  BILITOT 0.9 1.2 1.0  ALBUMIN 3.1* 3.5 3.1*    Cardiac Enzymes  Recent Labs Lab 09/15/15 1558 09/15/15 2016 09/16/15 0205  TROPONINI 0.08* 0.07* 0.07*    Glucose No results for input(s): GLUCAP in the last 168 hours.  Imaging Ct Abdomen Pelvis Wo Contrast  09/21/2015  CLINICAL DATA:  Hypotension. Worsening right lower quadrant and right groin pain. Decreased hemoglobin since this morning. On heparin. EXAM: CT ABDOMEN AND PELVIS WITHOUT CONTRAST TECHNIQUE: Multidetector CT imaging of the abdomen and pelvis was performed following the standard protocol without IV contrast. COMPARISON:  CT chest 09/16/2015 FINDINGS: Focal atelectasis or consolidation in the right lung base. Mild dependent changes in the left lung base. Small right pleural effusion. Pneumonia not excluded. There is a large retroperitoneal hematoma extending from the right upper quadrant involving the para renal space and extending throughout the right retroperitoneum to the iliopsoas region and along the right pelvis down into the groin region. At its largest extent, the hematoma measures about 8.8 x 17.6 cm. The hematoma displaces the right kidney anteriorly and displaces the IVC towards the left. The aorta is also displaced towards the left and appears grossly intact. Aortic  calcifications are present. No aneurysm. Sub cm low-attenuation lesions are present in the liver. These are too small to characterize but likely represent small cysts. Spleen is small in size. The unenhanced appearance of the gallbladder, pancreas, adrenal glands, kidneys, and retroperitoneal lymph nodes is unremarkable. Stomach, small bowel, and colon are not abnormally distended. No free air in the abdomen. Pelvis: Bladder is decompressed with a Foley catheter. Bilateral inguinal hernias, right containing fat and hematoma and left containing sigmoid colon. No evidence of proximal obstruction. Prostate gland is enlarged, measuring 5.4 cm diameter. Degenerative changes in the spine. IMPRESSION: Very large retroperitoneal hematoma extending throughout the right abdomen and pelvis and displaced adjacent organs. Source is not identified. No aortic aneurysm. Prostate gland is enlarged. Bilateral inguinal hernias, left containing sigmoid colon without proximal obstruction. Consolidation in the right lung base with small right pleural effusion. Possible pneumonia. These results will be  called to the ordering clinician or representative by the Radiologist Assistant, and communication documented in the PACS or zVision Dashboard. Electronically Signed   By: Burman Nieves M.D.   On: 09/21/2015 21:18     STUDIES:  CT ABD/PELVIS W/O 6/13: Very large retroperitoneal hematoma throughout right abdomen & pelvis with displaced adjacent organs. Source not identified. No aortic aneurysm. Bilateral inguinal hernias noted. Small right pleural effusion with adjacent opacification.  MICROBIOLOGY: MRSA PCR 6/7:  Negative  ANTIBIOTICS: NONE  SIGNIFICANT EVENTS: 6/07 - Admit 6/13 - Transfer to ICU after Rapid Response for Hypotension  LINES/TUBES: R BRACHIAL 3 LUMEN PICC 6/10 >> PIV X1  DISCUSSION:  65 year old male on systemic anticoagulation with heparin infusion for atrial fibrillation as well as amiodarone  infusion. Developed hypovolemic shock secondary to large retroperitoneal bleed. Continuing blood product resuscitation for further stabilization. Holding on further diuresis at this time.  ASSESSMENT / PLAN:  CARDIOVASCULAR A:  Shock - Hypovolemic from RP Bleed. Atrial Fibrillation  P:  Cardiology Following Continuing Digoxin Continue Amio gtt Holding anti-hypertensive medications & further diuresis Vasopressin for MAP support Aggressive resuscitation with IVF & blood product Holding Heparin gtt  HEMATOLOGIC A:   Systemic Anticoagulation - With Heparin gtt for A fib on hold. Anemia - Secondary to acute blood loss from RP bleed. Leukocytosis - Likely reactive demargination.  P:  Stat 2units PRBC & 2units FFP transfusion Plan for CBC & Coags post transfusion SCDs  PULMONARY A: Acute Hypoxic Respiratory Failure Tobacco Use  P:   Continuous Pulse Ox monitoring Weaning FiO2 for Sat >92% Nicotine Patch /24hr  RENAL A:   Acute Renal Failure - Worsening. Hypotonic Hyponatremia  P:   Monitoring UOP Trending electrolytes & renal function daily Replacing electrolytes as indicated  GASTROINTESTINAL A:   Retroperitoneal Hematoma Transaminitis - Improving.  P:   NPO except meds Trending LFTs daily Zofran IV prn  INFECTIOUS A:   No acute issues.  P:   Monitor for fever & signs of infection.  ENDOCRINE A:   Hyperglycemia - No h/o DM.    P:   Accu-Checks q4hr SSI per Low Dose Algorithm  NEUROLOGIC A:   No acute issues.  P:   Monitor closely.   FAMILY  - Updates: Family updated by Dr. Gala Romney at bedside.  - Inter-disciplinary family meet or Palliative Care meeting due by:  6/20  I have spent a total of 32 minutes of critical care time today caring for the patient, discussing the plan of care with Dr. Gala Romney at bedside, and reviewing the patient's electronic medical record.  Donna Christen Jamison Neighbor, M.D. [facility] Pulmonary & Critical  Care Pager:  [phone] After 3pm or if no response, call [phone] 10:20 PM 09/21/2015

## 2015-09-21 NOTE — Progress Notes (Signed)
Paged attending team with concerns about severe back, side and right side pain. Asked to come see pt. Will cont to monitor pt. Emelda Brothers RN

## 2015-09-21 NOTE — Progress Notes (Signed)
Called by RR due to hypotension. Pt on heparin, CT ap showed large retroperitoneal bleed. D/w RR and Dr. Adriana Reams at bedside-- pt has TLC (picc) in place, continue IVF, check clotting time, if elevated will give protamine-- d/w pharmacy dose of 45 mg-- order placed to use stat if needed.

## 2015-09-21 NOTE — Progress Notes (Signed)
Pt cont to be in severe pain. Very dusky appearance. BP remains low (see flow sheet). Notified Garnette Czech MS. Dr. Allena Katz to see pt. Will cont to monitor. Emelda Brothers RN

## 2015-09-21 NOTE — Progress Notes (Signed)
ANTICOAGULATION CONSULT NOTE   Pharmacy Consult for Heparin  Indication: atrial fibrillation  Allergies  Allergen Reactions  . Codeine Itching    whelps  . Other     Seasonal allergies, "all spices, salt, pepper"   Patient Measurements: Height: 6' (182.9 cm) Weight: 171 lb 9.6 oz (77.837 kg) IBW/kg (Calculated) : 77.6  Vital Signs: Temp: 98.6 F (37 C) (06/13 0430) Temp Source: Oral (06/13 0430) BP: 121/62 mmHg (06/13 0430) Pulse Rate: 97 (06/13 0430)  Labs:  Recent Labs  09/19/15 0429 09/20/15 0433 09/21/15 0445  HGB 12.4* 13.4  --   HCT 37.9* 41.7  --   PLT 196 210  --   HEPARINUNFRC 0.43 0.66 0.83*  CREATININE 0.99 0.97 0.99    Estimated Creatinine Clearance: 82.7 mL/min (by C-G formula based on Cr of 0.99).  Assessment: 65 year old with atrial fibrillation for heparin   Goal of Therapy:  Heparin level 0.3-0.7 units/ml Monitor platelets by anticoagulation protocol: Yes   Plan:  Decrease Heparin 1800 units/hr  Geannie Risen, PharmD, BCPS   09/21/2015 5:20 AM

## 2015-09-21 NOTE — Progress Notes (Signed)
Notified Reginald Clegg NP pt BP sys 74/. Milrinone prev decreased. NS bolus ordered. Pt cont to be in severe pain. Family at bedside. Will cont to monitor. Emelda Brothers RN

## 2015-09-21 NOTE — Progress Notes (Signed)
Subjective: Reginald Hahn complains with R hip, back, and groin pain overnight. Started last evening while in bed. Unable to describe onset. Burning, dull pain that is constant and worsens with movement. Very difficult to find a comfortable position and could not sleep. Tylenol 1g, flexeril, and hot/cold pads offered no improvement. Has ever had anything like this in the past. Did not walk yesterday. This AM also complains of nausea. Felt warm/feverish overnight. Denies chills. No trouble urinating aside from getting into position to urinate.  Feels he is breathing well (was transitioned to RA yesterday) and has no vision complaints.  Objective: Vital signs in last 24 hours: Filed Vitals:   09/21/15 0007 09/21/15 0430 09/21/15 0832 09/21/15 0838  BP: 104/70 121/62 99/72   Pulse: 90 97 58   Temp: 98.5 F (36.9 C) 98.6 F (37 C)  97 F (36.1 C)  TempSrc: Oral Oral  Axillary  Resp: Height:      Weight:  77.837 kg (171 lb 9.6 oz)    SpO2: 93% 95% 92%    Weight change: -3.221 kg (-7 lb 1.6 oz)  Intake/Output Summary (Last 24 hours) at 09/21/15 1131 Last data filed at 09/21/15 0011  Gross per 24 hour  Intake   1719 ml  Output   5395 ml  Net  -3676 ml   BP 99/72 mmHg  Pulse 58  Temp(Src) 97 F (36.1 C) (Axillary)  Resp 27  Ht 6' (1.829 m)  Wt 77.837 kg (171 lb 9.6 oz)  BMI 23.27 kg/m2  SpO2 92% General appearance: Curled up in bed, uncomfortable-appearing, mood seems down, tired but alert and cooperative, when asked questions and performing maneuvers Lungs: CTAB, no wheezing or crackles. Mildly tachypneic on RA but comfortable appearing and without increased WOB. Heart:Irregularly irregular. Non-tachy. Normal S1S2 without m/r/g. JVD at 30 degrees ~2-3 up clavicle Abdomen:Tender to palpation in RLQ. Jumps with initial palpation but then relaxes. Guards with deep palpation. No rebound. Negative Rovsing (no pain elicited with deep palpation in LLQ) and negative  Murphy's. Back: V. tender b/w R CVA and iliac crest. No tenderness over spine. No tenderness over L back.  MSK: R - quadricep notably tight. Painful hyperflexion, Faber maneuver, and forced dorsiflexion. Normal faber on L. Initially refused to lift R or L leg b/c of R hip pain. Later exams, was able to raise but painful in R leg.  Extremities: Cool after having cold pack on feet. 1+ pretibial pitting edema RLE that improves by uper shin. 1-2+ pretibial pitting edema in LLE that improves by upper shin.  Pulses: 1+ peripheral pulses bilaterally.  Lab Results:  Recent Labs Lab 09/19/15 0429 09/20/15 0433 09/21/15 0855  WBC 9.5 9.7 14.7*  HGB 12.4* 13.4 13.2  HCT 37.9* 41.7 40.4  PLT 196 210 229    Recent Labs Lab 09/18/15 0329 09/19/15 0429 09/20/15 0433 09/21/15 0445  NA 131* 131* 130* 126*  K 3.3* 3.6 4.6 4.5  CL 95* 96* 91* 86*  CO2 BUN 39* 29* 19 21*  CREATININE 1.17 0.99 0.97 0.99  CALCIUM 8.3* 8.1* 8.9 9.4  PROT 5.8* 5.2* 6.2*  --   BILITOT 1.2 0.9 1.2  --   ALKPHOS 89 91 101  --   ALT 527* 349* 311*  --   AST 247* 96* 78*  --   GLUCOSE 108* 96 90 139*   Medications: I have reviewed the patient's current medications. Scheduled Meds: . aspirin  81  mg Oral Daily  . digoxin  0.25 mg Oral Daily  . furosemide  80 mg Intravenous BID  . losartan  25 mg Oral Daily  . metolazone  2.5 mg Oral Daily  . nicotine  21 mg Transdermal Daily  . potassium chloride  40 mEq Oral BID  . sodium chloride flush  10-40 mL Intracatheter Q12H  . spironolactone  25 mg Oral Daily   Continuous Infusions: . amiodarone 30 mg/hr (09/21/15 0141)  . heparin 1,800 Units/hr (09/21/15 0825)  . milrinone 0.25 mcg/kg/min (09/21/15 0830)   PRN Meds:.acetaminophen, guaiFENesin, ipratropium, sodium chloride flush Assessment/Plan: Principal Problem:   Acute combined systolic and diastolic heart failure (HCC) Active Problems:   Atrial fibrillation with rapid ventricular response  (HCC)   Tobacco abuse   Transaminitis   AKI (acute kidney injury) (HCC)   Atrial fibrillation (HCC)   Cardiogenic shock [facility])  Reginald Hahn is a 59yoM with a history of tobacco use and 2 weeks of progressive dyspnea was found to be in atrial fibrillation with RVR on 6/7 and CHF with EF 15%. Care was advanced to rhythm control and inotropic/vasodilator therapy 6/10.  #Atrial fibrillation, CHF Most likely 2/2 viral cardiomyopathy, tachycardia-induced, lung disease, or silent MI. Started rhythm control, inotrope, vasodilator, and diuresis with CVP monitoring via PICC on 6/10 with milrinone later added. In afib with HR at goal <110. Plan for RHC/LHC, TEE, and cardioversion once euvolemic. Tolerating diuresis with net -10L over 2 days and CVP 10 from 13 yest. SBP 90s-110s. Warm, improving breathing with some hypervolemia on exam. Pulse pressure 30s. - Continue telemetry - Current rhythm and inotropic regimen is amiodarone and milrinone via PICC and digoxin 0.25mg  qdaily.  - Follow diuresis recs per cards. Continue Lasix 80mg  BID and metolazone 2.5mg  qdaily.  - Continue spironolactone 25mg  qdaily. Losartan increased to 25 mg yesterday. BP down some 90s/70s this AM.  - Heparin until cath. Then convert to NOAC. - Per HF consult, inpatient TEE/cardioversion 6/13 vs. 6/14 - Cards outpatient f/u - Echo in 3 months. May be ICD candidate.  # R hip, flank, leg, and groin pain Differential includes muscle spasm, sciatica, thrombosis, nephrolithiasis vs. pyelonephritis, and abdominal etiology (e.g. constipation, appendicitis). Most likely muscle spasm given tight R quadricep. Sciatica may be contributing given pt's history of sciatica, pain w/ hyperflexion, and radiation from back to leg, though does not seem to go below knee. Unlikely embolus given pulses present, ankles warm, feet evenly cold, and skin color symmetric. WBC, possible CVA tenderness, and nausea raise concern for kidney etiology. However,  afebrile and back tenderness below kidney. Abdominal etiologies less likely to radiate down leg, cause back tenderness or muscle tightness, and pt has non-acute abd. Last BM yesterday.  - Clean catch u/a - PT consult. Counseled on stretching - Flexeril 5mg  TID prn (per pt request, used in past for sciatica)  # Leukocytosis WBC 14.7 from 9.7 - Has had intermittent elevated white count during course. Original ddx was steroids in ED vs. Myocarditis. Afebrile. Will continue to monitor in context of new R leg/hip pain. See above work-up. - CBC w/ diff tomorrow   #Hypotonic hyponatremia Down 126 from 130. Attributed to HF earlier in course and improved w/ diuresis. However, Na now lower with more diuresis. May be 2/2 to metolazone, which can cause salt-wasting as a thiazide diuretic. No evidence of renal failure. Treatment for most etiologies is diuresis or fluid restriction, both of which the patient is on.  - Daily BMP  #Dyspnea,  wheezing, cough Dyspnea most likely 2/2 CHF. O2 weaned 6/12. Avoid duonebs given tachycardia.   - Ipratropium nebs q8h prn SOB and guaifenesin q4hr prn for cough (hasn't used either for a few days) - Consider outpatient PFTs for COPD  #Transaminitis, hypoalbuminemia Downtrending x2 days. LFTs fell off orders this AM but given improving trend and no evidence of other organ damage, not ordering today. At highest, was ALT and AST were 10-11x ULN. 2x ULN on admission.   - Recheck LFTs tomorrow to ensure trend.  # Vision change and mild decrease in R lateral-inferior quadrant vision Blurred vision resolved. Continue to monitor in context of BP, risk for optic neuropathy on amiodarone, risk emboli, and reduced perfusion.  - If concern for stroke, consider CT/MRI. - If concern for optic neuropathy, discuss with amiodarone w/ cardiology. May need ophtho consult.  # Tobacco abuse x30 years. 1 ppd  - Patch 21mg  while inpatient.  #VTE Prophylaxis - Heparin gtt -  Transition to NOAC after cath - Discuss with CM for medication assistance.  #FEN/GI - No IVF given hypervolemia. - K 4.5 today. On KCl 40mg  po BID repletion during aggressive diuresis. Goal K>4 and Mg>2. - Daily CBC, BMP, and Mg - Heart healthy diet  #Dispo - Likely d/c to home.  - Will need cardiology and PCP follow-up. - Had been seen by [facility] in past but doesn't necessarily want to follow with them. If wants to follow with [facility], consider [facility] vs[facility] as patient does not have insurance.   This is a Psychologist, occupational Note.  The care of the patient was discussed with Dr. Heywood Iles and the assessment and plan formulated with their assistance.  Please see their attached note for official documentation of the daily encounter.   LOS: 6 days   Newton Pigg, Med Student 09/21/2015, 11:31 AM

## 2015-09-22 ENCOUNTER — Encounter (HOSPITAL_COMMUNITY): Payer: Self-pay | Admitting: Anesthesiology

## 2015-09-22 ENCOUNTER — Encounter (HOSPITAL_COMMUNITY)
Admission: EM | Disposition: A | Discharge status: Home / Self Care | Payer: Self-pay | Source: Home / Self Care | Attending: Oncology

## 2015-09-22 LAB — CARBOXYHEMOGLOBIN
CARBOXYHEMOGLOBIN: 1.5 % (ref 0.5–1.5)
Carboxyhemoglobin: 2.1 % — ABNORMAL HIGH (ref 0.5–1.5)
Carboxyhemoglobin: 2.5 % — ABNORMAL HIGH (ref 0.5–1.5)
Carboxyhemoglobin: 1.6 % — ABNORMAL HIGH (ref 0.5–1.5)
Methemoglobin: 1.1 % (ref 0.0–1.5)
Methemoglobin: 0.8 % (ref 0.0–1.5)
Methemoglobin: 0.8 % (ref 0.0–1.5)
Methemoglobin: 1 % (ref 0.0–1.5)
O2 SAT: 92.2 %
O2 SAT: 59.7 %
O2 Saturation: 30.3 %
O2 Saturation: 93.3 %
TOTAL HEMOGLOBIN: 7.9 g/dL — AB (ref 13.5–18.0)
TOTAL HEMOGLOBIN: 8.3 g/dL — AB (ref 13.5–18.0)
Total hemoglobin: 9.1 g/dL — ABNORMAL LOW (ref 13.5–18.0)
Total hemoglobin: 8 g/dL — ABNORMAL LOW (ref 13.5–18.0)

## 2015-09-22 LAB — CBC
HEMATOCRIT: 26.7 % — AB (ref 39.0–52.0)
HEMOGLOBIN: 8.9 g/dL — AB (ref 13.0–17.0)
MCH: 28.1 pg (ref 26.0–34.0)
MCHC: 33.3 g/dL (ref 30.0–36.0)
MCV: 84.2 fL (ref 78.0–100.0)
Platelets: 163 10*3/uL (ref 150–400)
RBC: 3.17 MIL/uL — AB (ref 4.22–5.81)
RDW: 15 % (ref 11.5–15.5)
WBC: 21.2 10*3/uL — AB (ref 4.0–10.5)

## 2015-09-22 LAB — HEMOGLOBIN AND HEMATOCRIT, BLOOD
HCT: 21 % — ABNORMAL LOW (ref 39.0–52.0)
HEMATOCRIT: 22 % — AB (ref 39.0–52.0)
HEMOGLOBIN: 7.6 g/dL — AB (ref 13.0–17.0)
Hemoglobin: 7.2 g/dL — ABNORMAL LOW (ref 13.0–17.0)

## 2015-09-22 LAB — PREPARE FRESH FROZEN PLASMA
UNIT DIVISION: 0
Unit division: 0

## 2015-09-22 LAB — RENAL FUNCTION PANEL
ALBUMIN: 2.9 g/dL — AB (ref 3.5–5.0)
ANION GAP: 8 (ref 5–15)
BUN: 31 mg/dL — ABNORMAL HIGH (ref 6–20)
CALCIUM: 7.5 mg/dL — AB (ref 8.9–10.3)
CO2: 22 mmol/L (ref 22–32)
Chloride: 96 mmol/L — ABNORMAL LOW (ref 101–111)
Creatinine, Ser: 1.39 mg/dL — ABNORMAL HIGH (ref 0.61–1.24)
GFR calc non Af Amer: 52 mL/min — ABNORMAL LOW (ref >60)
Glucose, Bld: 222 mg/dL — ABNORMAL HIGH (ref 65–99)
PHOSPHORUS: 4.6 mg/dL (ref 2.5–4.6)
Potassium: 5.2 mmol/L — ABNORMAL HIGH (ref 3.5–5.1)
SODIUM: 126 mmol/L — AB (ref 135–145)

## 2015-09-22 LAB — HEPATIC FUNCTION PANEL
ALBUMIN: 2.9 g/dL — AB (ref 3.5–5.0)
ALK PHOS: 75 U/L (ref 38–126)
ALT: 149 U/L — ABNORMAL HIGH (ref 17–63)
AST: 63 U/L — ABNORMAL HIGH (ref 15–41)
Bilirubin, Direct: 0.6 mg/dL — ABNORMAL HIGH (ref 0.1–0.5)
Indirect Bilirubin: 1.2 mg/dL — ABNORMAL HIGH (ref 0.3–0.9)
TOTAL PROTEIN: 5.1 g/dL — AB (ref 6.5–8.1)
Total Bilirubin: 1.8 mg/dL — ABNORMAL HIGH (ref 0.3–1.2)

## 2015-09-22 LAB — BASIC METABOLIC PANEL
ANION GAP: 7 (ref 5–15)
BUN: 29 mg/dL — AB (ref 6–20)
CO2: 25 mmol/L (ref 22–32)
CREATININE: 1.01 mg/dL (ref 0.61–1.24)
Calcium: 7.9 mg/dL — ABNORMAL LOW (ref 8.9–10.3)
Chloride: 98 mmol/L — ABNORMAL LOW (ref 101–111)
GFR calc Af Amer: >60 mL/min (ref >60)
GFR calc non Af Amer: >60 mL/min (ref >60)
Glucose, Bld: 154 mg/dL — ABNORMAL HIGH (ref 65–99)
POTASSIUM: 4.4 mmol/L (ref 3.5–5.1)
Sodium: 130 mmol/L — ABNORMAL LOW (ref 135–145)

## 2015-09-22 LAB — CBC WITH DIFFERENTIAL/PLATELET
Basophils Absolute: 0 10*3/uL (ref 0.0–0.1)
Basophils Relative: 0 %
EOS PCT: 0 %
Eosinophils Absolute: 0 10*3/uL (ref 0.0–0.7)
HEMATOCRIT: 25.7 % — AB (ref 39.0–52.0)
Hemoglobin: 8.6 g/dL — ABNORMAL LOW (ref 13.0–17.0)
LYMPHS ABS: 0.9 10*3/uL (ref 0.7–4.0)
LYMPHS PCT: 4 %
MCH: 28.5 pg (ref 26.0–34.0)
MCHC: 33.5 g/dL (ref 30.0–36.0)
MCV: 85.1 fL (ref 78.0–100.0)
Monocytes Absolute: 0.8 10*3/uL (ref 0.1–1.0)
Monocytes Relative: 4 %
NEUTROS ABS: 21.2 10*3/uL — AB (ref 1.7–7.7)
NEUTROS PCT: 92 %
PLATELETS: 153 10*3/uL (ref 150–400)
RBC: 3.02 MIL/uL — AB (ref 4.22–5.81)
RDW: 15.4 % (ref 11.5–15.5)
WBC: 22.9 10*3/uL — AB (ref 4.0–10.5)

## 2015-09-22 LAB — APTT: aPTT: 21 seconds — ABNORMAL LOW (ref 24–37)

## 2015-09-22 LAB — GLUCOSE, CAPILLARY
GLUCOSE-CAPILLARY: 215 mg/dL — AB (ref 65–99)
GLUCOSE-CAPILLARY: 154 mg/dL — AB (ref 65–99)
GLUCOSE-CAPILLARY: 167 mg/dL — AB (ref 65–99)
GLUCOSE-CAPILLARY: 219 mg/dL — AB (ref 65–99)
Glucose-Capillary: 167 mg/dL — ABNORMAL HIGH (ref 65–99)
Glucose-Capillary: 140 mg/dL — ABNORMAL HIGH (ref 65–99)
Glucose-Capillary: 107 mg/dL — ABNORMAL HIGH (ref 65–99)

## 2015-09-22 LAB — PHOSPHORUS: Phosphorus: 4.8 mg/dL — ABNORMAL HIGH (ref 2.5–4.6)

## 2015-09-22 LAB — PROTIME-INR
INR: 1.35 (ref 0.00–1.49)
INR: 1.44 (ref 0.00–1.49)
Prothrombin Time: 16.8 seconds — ABNORMAL HIGH (ref 11.6–15.2)
Prothrombin Time: 17.6 seconds — ABNORMAL HIGH (ref 11.6–15.2)

## 2015-09-22 LAB — MAGNESIUM
Magnesium: 2 mg/dL (ref 1.7–2.4)
Magnesium: 1.9 mg/dL (ref 1.7–2.4)

## 2015-09-22 LAB — PREPARE RBC (CROSSMATCH)

## 2015-09-22 SURGERY — RIGHT/LEFT HEART CATH AND CORONARY ANGIOGRAPHY
Anesthesia: LOCAL

## 2015-09-22 MED ORDER — SODIUM CHLORIDE 0.9 % IV SOLN: Freq: Once | INTRAVENOUS | Status: AC

## 2015-09-22 MED ORDER — FUROSEMIDE 10 MG/ML IJ SOLN
40.0000 mg | Freq: Once | INTRAMUSCULAR | Status: AC
  Administered 2015-09-22: 40 mg via INTRAVENOUS
  Filled 2015-09-22: qty 4

## 2015-09-22 MED ORDER — SODIUM CHLORIDE 0.9 % IV SOLN: Freq: Once | INTRAVENOUS | Status: DC

## 2015-09-22 MED ORDER — MAGNESIUM SULFATE 2 GM/50ML IV SOLN
2.0000 g | Freq: Once | INTRAVENOUS | Status: AC
  Administered 2015-09-22: 2 g via INTRAVENOUS
  Filled 2015-09-22: qty 50

## 2015-09-22 MED ORDER — SODIUM POLYSTYRENE SULFONATE 15 GM/60ML PO SUSP
30.0000 g | Freq: Once | ORAL | Status: DC
  Filled 2015-09-22: qty 120

## 2015-09-22 MED ORDER — MILRINONE LACTATE IN DEXTROSE 20-5 MG/100ML-% IV SOLN
0.1250 ug/kg/min | INTRAVENOUS | Status: DC
  Administered 2015-09-22: 0.125 ug/kg/min via INTRAVENOUS
  Administered 2015-09-22: 0.25 ug/kg/min via INTRAVENOUS
  Administered 2015-09-23 – 2015-09-24 (×2): 0.125 ug/kg/min via INTRAVENOUS
  Filled 2015-09-22 (×4): qty 100

## 2015-09-22 MED ORDER — SODIUM POLYSTYRENE SULFONATE 15 GM/60ML PO SUSP
30.0000 g | Freq: Once | ORAL | Status: AC
  Administered 2015-09-22: 30 g via RECTAL
  Filled 2015-09-22: qty 120

## 2015-09-22 NOTE — Progress Notes (Signed)
eLink Physician-Brief Progress Note Patient Name: Reginald Hahn DOB: 05-21-1950 MRN: [id number]   Date of Service  09/22/2015  HPI/Events of Note  Multiple issues: 1. K+ = 5.2, Creatinine = 1.39 and INR = 1.35. Retroperitoneal hemorrhage.   eICU Interventions  Will order: 1. Transfuse 2 units FFP. 2. Kayexalate enema 30 gm PR now. 3. BMP at 12 noon.      Intervention Category Intermediate Interventions: Coagulopathy - evaluation and management;Electrolyte abnormality - evaluation and management  Sommer,Steven Eugene 09/22/2015, 4:27 AM

## 2015-09-22 NOTE — Progress Notes (Signed)
Advanced Heart Failure Rounding Note   Subjective:    65 year old male without prior cardiac history who presented to the ED with progressive dyspnea and fatigue and was found to be in rapid atrial fibrillation after recent flu-like illness. EF found to be 15%  Initial Co-ox 37%  Started on milrinone 0.25 and IV amio.   Yesterday milrinone stopped due to hypotension. Had large retroperitoneal bleed. Heparin stopped. Received 4FFP and 2URPBCs. Started on vasopressin 0.03 units. Transferred to ICU. CCM consulted.    Todays CO-OX is 30% . Milrinone restarted 0.25 mcg.   Pain better controlled. Denies SOB.    Objective:   Weight Range:  Vital Signs:   Temp:  [93.7 F (34.3 C)-98 F (36.7 C)] 98 F (36.7 C) (06/14 0725) Pulse Rate:  [25-144] 84 (06/14 1030) Resp:  [13-45] 26 (06/14 1030) BP: (56-159)/(0-122) 72/50 mmHg (06/14 1030) SpO2:  [90 %-100 %] 100 % (06/14 1030) Weight:  [178 lb 5.6 oz (80.9 kg)-183 lb 3.2 oz (83.1 kg)] 183 lb 3.2 oz (83.1 kg) (06/14 0500) Last BM Date: 09/22/15  Weight change: Filed Weights   09/21/15 0430 09/21/15 2300 09/22/15 0500  Weight: 171 lb 9.6 oz (77.837 kg) 178 lb 5.6 oz (80.9 kg) 183 lb 3.2 oz (83.1 kg)    Intake/Output:   Intake/Output Summary (Last 24 hours) at 09/22/15 1112 Last data filed at 09/22/15 1000  Gross per 24 hour  Intake 7564.41 ml  Output    675 ml  Net 6889.41 ml     Physical Exam: General:  In bed. NAD. Pale.  HEENT: normal Neck: supple. JVP ~10. Carotids 2+ bilat; no bruits. No thyromegaly or nodule noted.  Cor: PMI laterally displaced. Tachy IRR, IRR + s3 Lungs: mild crackles Abdomen: soft, nontender, + mildly distended. No hepatosplenomegaly. No bruits or masses. Good bowel sounds. Extremities: no cyanosis, clubbing, rash, edema  Neuro: alert & orientedx3, cranial nerves grossly intact. moves all 4 extremities w/o difficulty. Affect pleasant  Telemetry: AF 90-110s  Labs: Basic Metabolic  Panel:  Recent Labs Lab 09/19/15 0429 09/20/15 0433 09/21/15 0445 09/21/15 0855 09/21/15 1924 09/21/15 2145 09/22/15 0315 09/22/15 0620  NA 131* 130* 126*  --  125* 127* 126*  --   K 3.6 4.6 4.5  --  5.1 4.8 5.2*  --   CL 96* 91* 86*  --  90* 92* 96*  --   CO2 --  26  --  22  --   GLUCOSE 96 90 139*  --  136* 179* 222*  --   BUN 29* 19 21*  --  28* 27* 31*  --   CREATININE 0.99 0.97 0.99  --  1.68* 1.50* 1.39*  --   CALCIUM 8.1* 8.9 9.4  --  8.4*  --  7.5*  --   MG 2.2 2.1  --  1.9 2.0  --   --  1.9  PHOS  --   --   --   --  4.8*  --  4.6  --     Liver Function Tests:  Recent Labs Lab 09/18/15 0329 09/19/15 0429 09/20/15 0433 09/21/15 1924 09/22/15 0315 09/22/15 0620  AST 247* 96* 78* 63*  --  63*  ALT 527* 349* 311* 198*  --  149*  ALKPHOS 89 91 101 87  --  75  BILITOT 1.2 0.9 1.2 1.0  --  1.8*  PROT 5.8* 5.2* 6.2* 5.0*  --  5.1*  ALBUMIN 3.4* 3.1*  3.5 3.1* 2.9* 2.9*   No results for input(s): LIPASE, AMYLASE in the last 168 hours. No results for input(s): AMMONIA in the last 168 hours.  CBC:  Recent Labs Lab 09/17/15 0415  09/21/15 0855 09/21/15 1924 09/21/15 2140 09/21/15 2145 09/22/15 0315 09/22/15 0620  WBC 19.9*  < > 14.7* 14.3* 14.6*  --  21.2* 22.9*  NEUTROABS 16.9*  --   --   --   --   --   --  21.2*  HGB 13.3  < > 13.2 10.9* 8.8* 9.5* 8.9* 8.6*  HCT 40.4  < > 40.4 32.5* 26.9* 28.0* 26.7* 25.7*  MCV 88.4  < > 88.6 86.4 86.8  --  84.2 85.1  PLT 259  < > 229 215 204  --  163 153  < > = values in this interval not displayed.  Cardiac Enzymes:  Recent Labs Lab 09/15/15 1558 09/15/15 2016 09/16/15 0205 09/17/15 0416  CKTOTAL  --   --   --  770*  CKMB  --   --   --  32.9*  TROPONINI 0.08* 0.07* 0.07*  --     BNP: BNP (last 3 results)  Recent Labs  09/15/15 1109  BNP 919.3*    ProBNP (last 3 results) No results for input(s): PROBNP in the last 8760 hours.    Other results:  Imaging: Ct Abdomen Pelvis Wo  Contrast  09/21/2015  CLINICAL DATA:  Hypotension. Worsening right lower quadrant and right groin pain. Decreased hemoglobin since this morning. On heparin. EXAM: CT ABDOMEN AND PELVIS WITHOUT CONTRAST TECHNIQUE: Multidetector CT imaging of the abdomen and pelvis was performed following the standard protocol without IV contrast. COMPARISON:  CT chest 09/16/2015 FINDINGS: Focal atelectasis or consolidation in the right lung base. Mild dependent changes in the left lung base. Small right pleural effusion. Pneumonia not excluded. There is a large retroperitoneal hematoma extending from the right upper quadrant involving the para renal space and extending throughout the right retroperitoneum to the iliopsoas region and along the right pelvis down into the groin region. At its largest extent, the hematoma measures about 8.8 x 17.6 cm. The hematoma displaces the right kidney anteriorly and displaces the IVC towards the left. The aorta is also displaced towards the left and appears grossly intact. Aortic calcifications are present. No aneurysm. Sub cm low-attenuation lesions are present in the liver. These are too small to characterize but likely represent small cysts. Spleen is small in size. The unenhanced appearance of the gallbladder, pancreas, adrenal glands, kidneys, and retroperitoneal lymph nodes is unremarkable. Stomach, small bowel, and colon are not abnormally distended. No free air in the abdomen. Pelvis: Bladder is decompressed with a Foley catheter. Bilateral inguinal hernias, right containing fat and hematoma and left containing sigmoid colon. No evidence of proximal obstruction. Prostate gland is enlarged, measuring 5.4 cm diameter. Degenerative changes in the spine. IMPRESSION: Very large retroperitoneal hematoma extending throughout the right abdomen and pelvis and displaced adjacent organs. Source is not identified. No aortic aneurysm. Prostate gland is enlarged. Bilateral inguinal hernias, left  containing sigmoid colon without proximal obstruction. Consolidation in the right lung base with small right pleural effusion. Possible pneumonia. These results will be called to the ordering clinician or representative by the Radiologist Assistant, and communication documented in the PACS or zVision Dashboard. Electronically Signed   By: Burman Nieves M.D.   On: 09/21/2015 21:18     Medications:     Scheduled Medications: . sodium chloride   Intravenous Once  .  digoxin  0.25 mg Oral Daily  . insulin aspart  0-9 Units Subcutaneous Q4H  . magnesium sulfate 1 - 4 g bolus IVPB  2 g Intravenous Once  . nicotine  21 mg Transdermal Daily  . sodium chloride flush  10-40 mL Intracatheter Q12H  . sodium chloride flush  3 mL Intravenous Q12H  . sodium polystyrene  30 g Oral Once    Infusions: . sodium chloride    . amiodarone 30 mg/hr (09/22/15 0800)  . milrinone 0.25 mcg/kg/min (09/22/15 1042)  . vasopressin (PITRESSIN) infusion - *FOR GI BLEED* 0.3 Units/min (09/22/15 0700)    PRN Medications: acetaminophen, cyclobenzaprine, fentaNYL (SUBLIMAZE) injection, ipratropium, ondansetron (ZOFRAN) IV, sodium chloride flush, sodium chloride flush   Assessment:   1. New onset atrial fibrillation with rapid ventricular response:  2. Acute combined systolic and diastolic CHF:   --EF 15% 3. Tobacco abuse:  4. AKI - improving 5. Transaminitis 6. Hypokalemia/hyponatremia 7. Retroperitoneal Bleed- 6/13 8. Acute Blood Loss--->RTP   Plan/Discussion:    He has severe acute systolic HF and suspect related to tachy-induced CM related to AF (which likely occurred in setting of recent illness). Continue digoxin, spiro and losartan.  No b-blocker yet  Todays CO-OX down to 30%. Started back on milrinone 0.25 mcg + vasopressin 0.03 units. Repeat CO-OX now. Continue dig. Off diuretics. CVP today higher. May need to add diuretics.    Heart cath on hold due to RTP. Today hgb 8.6   Remains in AF with  RVR. Continue IV amio for rate control. Off heparin with RTP. Hold  TEE-DC/CV for now.     Length of Stay: 7  Amy Clegg, NP-C 09/22/2015, 11:12 AM  Advanced Heart Failure Team Pager [phone] (M-F; 7a - 4p)  Please contact [facility] for night-coverage after hours (4p -7a ) and weekends on [email or web address]   Patient seen and examined with Tonye Becket, NP. We discussed all aspects of the encounter. I agree with the assessment and plan as stated above.   Patient with massive RP bleed last night treated with transfusion, IVF and vasopressin. BP improved today but co-ox way down so strated on milrinone for recurrent cardiogenic shock. F/u co-ox inaccurate. Hgb tonight 7.6. SBP now 130. CVP 15 (checked personally). Will give 1 more unit RBCs. Stop vasopressin. Continue milrinone and amio. Now back in NSR.   The patient is critically ill with multiple organ systems failure and requires high complexity decision making for assessment and support, frequent evaluation and titration of therapies, application of advanced monitoring technologies and extensive interpretation of multiple databases.   Critical Care Time devoted to patient care services described in this note is 35 Minutes.  Corbitt Cloke,MD 8:04 PM

## 2015-09-22 NOTE — Clinical Social Work Note (Signed)
CSW acknowledges consult for assistance with hearing aids. Will make RNCM aware.  CSW signing off. Consult if any other social work needs arise.  Charlynn Court, CSW [phone]

## 2015-09-22 NOTE — Progress Notes (Signed)
eLink Physician-Brief Progress Note Patient Name: Reginald Hahn DOB: 10-Mar-1951 MRN: [id number]   Date of Service  09/22/2015  HPI/Events of Note  RP bleed Post tranfusion Hgb 7.6  eICU Interventions  Monitor Hold transfusion for now     Intervention Category Intermediate Interventions: Bleeding - evaluation and treatment with blood products  Max Fickle 09/22/2015, 6:23 PM

## 2015-09-22 NOTE — Progress Notes (Signed)
Emergent release 1 U PRBC started @ 2200 and ended at 2345. Unable to document stop rate under blood admin Reginald Hahn, [place] Marie

## 2015-09-22 NOTE — Consult Note (Signed)
PULMONARY / CRITICAL CARE MEDICINE   Name: Reginald Hahn MRN: [id number] DOB: 02-08-1951    ADMISSION DATE:  09/15/2015 CONSULTATION DATE:  09/21/2015  REFERRING MD:  Vilinda Boehringer, M.D. / IMTS  CHIEF COMPLAINT:  Hypotension  HISTORY OF PRESENT ILLNESS:  Patient currently on internal medicine teaching service with atrial fibrillation on systemic anticoagulation with heparin infusion as well as amiodarone. Patient previously also on milrinone. Patient developed retroperitoneal hematoma resulting in hypotension and was subsequently transferred to the intensive care unit this evening. Heparin infusion was stopped. Dr. Gala Romney was at bedside and requested consultation from our service. Patient at this time denies any difficulty breathing or chest pain/pressure. He denies any nausea but is reporting diffuse abdominal pain. No headache or vision changes. Patient has been bolused IV fluids and started on vasopressor infusion while awaiting blood products.  SUBJECTIVE: Hypotensive overnight.  VITAL SIGNS: BP 72/50 mmHg  Pulse 84  Temp(Src) 98 F (36.7 C) (Oral)  Resp 26  Ht 6' (1.829 m)  Wt 83.1 kg (183 lb 3.2 oz)  BMI 24.84 kg/m2  SpO2 100%  HEMODYNAMICS: CVP:  [3 mmHg-24 mmHg] 13 mmHg  VENTILATOR SETTINGS:    INTAKE / OUTPUT: I/O last 3 completed shifts: In: 7398.5 [P.O.:480; I.V.:1890.5; Blood:1528; Other:2500; IV Piggyback:1000] Out: 2375 [Urine:2375]  PHYSICAL EXAMINATION: General:  Awake. Alert. No acute distress. Laying recumbent in bed.  Integument:  Warm & dry. No rash on exposed skin.  Lymphatics:  No appreciated cervical or supraclavicular lymphadenoapthy. HEENT: Dry mucus membranes. No oral ulcers. No scleral injection or icterus.  Cardiovascular:  Slightly tachycardic. No edema. No appreciable JVD.  Pulmonary:  Good aeration & clear to auscultation bilaterally. Normal work of breathing on nasal cannula oxygen. Abdomen: Soft. Normal bowel sounds. Protuberant.  Diffusely tender to palpation. Musculoskeletal:  Normal bulk and tone. No joint deformity or effusion appreciated. Neurological:  CN 2-12 grossly in tact. No meningismus. Moving all 4 extremities equally.  Psychiatric:  Mood and affect congruent. Speech normal rhythm, rate & tone. Alert and oriented 3. LABS:  BMET  Recent Labs Lab 09/21/15 0445 09/21/15 1924 09/21/15 2145 09/22/15 0315  NA 126* 125* 127* 126*  K 4.5 5.1 4.8 5.2*  CL 86* 90* 92* 96*  CO2 28 26  --  22  BUN 21* 28* 27* 31*  CREATININE 0.99 1.68* 1.50* 1.39*  GLUCOSE 139* 136* 179* 222*   Electrolytes  Recent Labs Lab 09/21/15 0445 09/21/15 0855 09/21/15 1924 09/22/15 0315 09/22/15 0620  CALCIUM 9.4  --  8.4* 7.5*  --   MG  --  1.9 2.0  --  1.9  PHOS  --   --  4.8* 4.6  --    CBC  Recent Labs Lab 09/21/15 2140 09/21/15 2145 09/22/15 0315 09/22/15 0620  WBC 14.6*  --  21.2* 22.9*  HGB 8.8* 9.5* 8.9* 8.6*  HCT 26.9* 28.0* 26.7* 25.7*  PLT 204  --  163 153   Coag's  Recent Labs Lab 09/15/15 1109 09/21/15 2108 09/22/15 0315  APTT  --  62* 21*  INR 1.41 1.64* 1.35   Sepsis Markers  Recent Labs Lab 09/21/15 1925  LATICACIDVEN 1.0   ABG  Recent Labs Lab 09/15/15 1248 09/21/15 1916  PHART 7.463* 7.509*  PCO2ART 30.0* 31.6*  PO2ART 79.0* 77.0*    Liver Enzymes  Recent Labs Lab 09/20/15 0433 09/21/15 1924 09/22/15 0315 09/22/15 0620  AST 78* 63*  --  63*  ALT 311* 198*  --  149*  ALKPHOS 101  87  --  75  BILITOT 1.2 1.0  --  1.8*  ALBUMIN 3.5 3.1* 2.9* 2.9*   Cardiac Enzymes  Recent Labs Lab 09/15/15 1558 09/15/15 2016 09/16/15 0205  TROPONINI 0.08* 0.07* 0.07*   Glucose  Recent Labs Lab 09/21/15 2328 09/22/15 0309 09/22/15 0731  GLUCAP 167* 215* 154*   Imaging Ct Abdomen Pelvis Wo Contrast  09/21/2015  CLINICAL DATA:  Hypotension. Worsening right lower quadrant and right groin pain. Decreased hemoglobin since this morning. On heparin. EXAM: CT ABDOMEN  AND PELVIS WITHOUT CONTRAST TECHNIQUE: Multidetector CT imaging of the abdomen and pelvis was performed following the standard protocol without IV contrast. COMPARISON:  CT chest 09/16/2015 FINDINGS: Focal atelectasis or consolidation in the right lung base. Mild dependent changes in the left lung base. Small right pleural effusion. Pneumonia not excluded. There is a large retroperitoneal hematoma extending from the right upper quadrant involving the para renal space and extending throughout the right retroperitoneum to the iliopsoas region and along the right pelvis down into the groin region. At its largest extent, the hematoma measures about 8.8 x 17.6 cm. The hematoma displaces the right kidney anteriorly and displaces the IVC towards the left. The aorta is also displaced towards the left and appears grossly intact. Aortic calcifications are present. No aneurysm. Sub cm low-attenuation lesions are present in the liver. These are too small to characterize but likely represent small cysts. Spleen is small in size. The unenhanced appearance of the gallbladder, pancreas, adrenal glands, kidneys, and retroperitoneal lymph nodes is unremarkable. Stomach, small bowel, and colon are not abnormally distended. No free air in the abdomen. Pelvis: Bladder is decompressed with a Foley catheter. Bilateral inguinal hernias, right containing fat and hematoma and left containing sigmoid colon. No evidence of proximal obstruction. Prostate gland is enlarged, measuring 5.4 cm diameter. Degenerative changes in the spine. IMPRESSION: Very large retroperitoneal hematoma extending throughout the right abdomen and pelvis and displaced adjacent organs. Source is not identified. No aortic aneurysm. Prostate gland is enlarged. Bilateral inguinal hernias, left containing sigmoid colon without proximal obstruction. Consolidation in the right lung base with small right pleural effusion. Possible pneumonia. These results will be called to the  ordering clinician or representative by the Radiologist Assistant, and communication documented in the PACS or zVision Dashboard. Electronically Signed   By: Burman Nieves M.D.   On: 09/21/2015 21:18   STUDIES:  CT ABD/PELVIS W/O 6/13: Very large retroperitoneal hematoma throughout right abdomen & pelvis with displaced adjacent organs. Source not identified. No aortic aneurysm. Bilateral inguinal hernias noted. Small right pleural effusion with adjacent opacification.  MICROBIOLOGY: MRSA PCR 6/7:  Negative  ANTIBIOTICS: NONE  SIGNIFICANT EVENTS: 6/07 - Admit 6/13 - Transfer to ICU after Rapid Response for Hypotension  LINES/TUBES: R BRACHIAL 3 LUMEN PICC 6/10 >> PIV X1  DISCUSSION:  65 year old male on systemic anticoagulation with heparin infusion for atrial fibrillation as well as amiodarone infusion. Developed hypovolemic shock secondary to large retroperitoneal bleed. Continuing blood product resuscitation for further stabilization. Holding on further diuresis at this time.  ASSESSMENT / PLAN:  CARDIOVASCULAR A:  Shock - Hypovolemic from RP Bleed. Atrial Fibrillation  P:  Cardiology Following Continuing Digoxin Continue Amio gtt Increase milrinone to .25 Holding anti-hypertensive medications & further diuresis Vasopressin for MAP support Aggressive resuscitation with IVF & blood product Holding Heparin gtt  HEMATOLOGIC A:   Systemic Anticoagulation - With Heparin gtt for A fib on hold. Anemia - Secondary to acute blood loss from RP bleed. Leukocytosis -  Likely reactive demargination.  P:  Follow H&H and transfuse as needed. CBC and INR in AM. SCDs  PULMONARY A: Acute Hypoxic Respiratory Failure Tobacco Use  P:   Continuous Pulse Ox monitoring Weaning FiO2 for Sat >92% Nicotine Patch /24hr  RENAL A:   Acute Renal Failure - Worsening. Hypotonic Hyponatremia  P:   Monitoring UOP Trending electrolytes & renal function daily Replacing  electrolytes as indicated  GASTROINTESTINAL A:   Retroperitoneal Hematoma Transaminitis - Improving.  P:   Liquid diet, may advance to heart healthy as tolerated. Repeat CT of the abdomen and pelvis in AM to evaluate extent of bleeding expansion, no contrast. Zofran IV prn  INFECTIOUS A:   No acute issues.  P:   Monitor for fever & signs of infection.  ENDOCRINE A:   Hyperglycemia - No h/o DM.    P:   Accu-Checks q4hr SSI per Low Dose Algorithm  NEUROLOGIC A:   No acute issues.  P:   Monitor closely.  FAMILY  - Updates: Patient updated bedside.  - Inter-disciplinary family meet or Palliative Care meeting due by:  6/20  The patient is critically ill with multiple organ systems failure and requires high complexity decision making for assessment and support, frequent evaluation and titration of therapies, application of advanced monitoring technologies and extensive interpretation of multiple databases.   Critical Care Time devoted to patient care services described in this note is  35  Minutes. This time reflects time of care of this signee Dr Koren Bound. This critical care time does not reflect procedure time, or teaching time or supervisory time of PA/NP/Med student/Med Resident etc but could involve care discussion time.  Alyson Reedy, M.D. [facility] Pulmonary/Critical Care Medicine. Pager: [phone]. After hours pager: [phone].  10:52 AM 09/22/2015

## 2015-09-23 ENCOUNTER — Encounter (HOSPITAL_COMMUNITY)
Admission: EM | Disposition: A | Discharge status: Home / Self Care | Payer: Self-pay | Source: Home / Self Care | Attending: Oncology

## 2015-09-23 ENCOUNTER — Inpatient Hospital Stay (HOSPITAL_COMMUNITY): Payer: Self-pay

## 2015-09-23 DIAGNOSIS — D62 Acute posthemorrhagic anemia: Secondary | ICD-10-CM

## 2015-09-23 LAB — BASIC METABOLIC PANEL
Anion gap: 10 (ref 5–15)
Anion gap: 8 (ref 5–15)
BUN: 25 mg/dL — AB (ref 6–20)
BUN: 22 mg/dL — AB (ref 6–20)
CALCIUM: 7.8 mg/dL — AB (ref 8.9–10.3)
CHLORIDE: 92 mmol/L — AB (ref 101–111)
CHLORIDE: 93 mmol/L — AB (ref 101–111)
CO2: 27 mmol/L (ref 22–32)
CO2: 26 mmol/L (ref 22–32)
CREATININE: 1.02 mg/dL (ref 0.61–1.24)
CREATININE: 0.85 mg/dL (ref 0.61–1.24)
Calcium: 8.1 mg/dL — ABNORMAL LOW (ref 8.9–10.3)
GFR calc Af Amer: >60 mL/min (ref >60)
GFR calc non Af Amer: >60 mL/min (ref >60)
GFR calc non Af Amer: >60 mL/min (ref >60)
GLUCOSE: 169 mg/dL — AB (ref 65–99)
Glucose, Bld: 106 mg/dL — ABNORMAL HIGH (ref 65–99)
Potassium: 3.5 mmol/L (ref 3.5–5.1)
Potassium: 3.1 mmol/L — ABNORMAL LOW (ref 3.5–5.1)
Sodium: 129 mmol/L — ABNORMAL LOW (ref 135–145)
Sodium: 127 mmol/L — ABNORMAL LOW (ref 135–145)

## 2015-09-23 LAB — PREPARE FRESH FROZEN PLASMA
UNIT DIVISION: 0
Unit division: 0

## 2015-09-23 LAB — TYPE AND SCREEN
ABO/RH(D): B POS
ANTIBODY SCREEN: NEGATIVE
UNIT DIVISION: 0
UNIT DIVISION: 0
Unit division: 0
Unit division: 0
Unit division: 0

## 2015-09-23 LAB — GLUCOSE, CAPILLARY
GLUCOSE-CAPILLARY: 129 mg/dL — AB (ref 65–99)
GLUCOSE-CAPILLARY: 109 mg/dL — AB (ref 65–99)
Glucose-Capillary: 106 mg/dL — ABNORMAL HIGH (ref 65–99)
Glucose-Capillary: 101 mg/dL — ABNORMAL HIGH (ref 65–99)
Glucose-Capillary: 114 mg/dL — ABNORMAL HIGH (ref 65–99)

## 2015-09-23 LAB — HEMOGLOBIN AND HEMATOCRIT, BLOOD
HCT: 27.4 % — ABNORMAL LOW (ref 39.0–52.0)
HCT: 27.8 % — ABNORMAL LOW (ref 39.0–52.0)
HCT: 27.5 % — ABNORMAL LOW (ref 39.0–52.0)
HEMATOCRIT: 28.5 % — AB (ref 39.0–52.0)
Hemoglobin: 9.1 g/dL — ABNORMAL LOW (ref 13.0–17.0)
Hemoglobin: 9.2 g/dL — ABNORMAL LOW (ref 13.0–17.0)
Hemoglobin: 9 g/dL — ABNORMAL LOW (ref 13.0–17.0)
Hemoglobin: 9.3 g/dL — ABNORMAL LOW (ref 13.0–17.0)

## 2015-09-23 LAB — CBC WITH DIFFERENTIAL/PLATELET
Basophils Absolute: 0 10*3/uL (ref 0.0–0.1)
Basophils Relative: 0 %
EOS ABS: 0 10*3/uL (ref 0.0–0.7)
Eosinophils Relative: 0 %
HEMATOCRIT: 26.6 % — AB (ref 39.0–52.0)
HEMOGLOBIN: 8.9 g/dL — AB (ref 13.0–17.0)
LYMPHS ABS: 0.7 10*3/uL (ref 0.7–4.0)
Lymphocytes Relative: 4 %
MCH: 27.7 pg (ref 26.0–34.0)
MCHC: 33.5 g/dL (ref 30.0–36.0)
MCV: 82.9 fL (ref 78.0–100.0)
Monocytes Absolute: 1.6 10*3/uL — ABNORMAL HIGH (ref 0.1–1.0)
Monocytes Relative: 8 %
NEUTROS PCT: 88 %
Neutro Abs: 17.2 10*3/uL — ABNORMAL HIGH (ref 1.7–7.7)
Platelets: 144 10*3/uL — ABNORMAL LOW (ref 150–400)
RBC: 3.21 MIL/uL — ABNORMAL LOW (ref 4.22–5.81)
RDW: 16.3 % — ABNORMAL HIGH (ref 11.5–15.5)
WBC: 19.5 10*3/uL — ABNORMAL HIGH (ref 4.0–10.5)

## 2015-09-23 LAB — PHOSPHORUS: Phosphorus: 3.2 mg/dL (ref 2.5–4.6)

## 2015-09-23 LAB — CARBOXYHEMOGLOBIN
Carboxyhemoglobin: 1.7 % — ABNORMAL HIGH (ref 0.5–1.5)
Methemoglobin: 1.1 % (ref 0.0–1.5)
O2 SAT: 72.1 %
Total hemoglobin: 9.3 g/dL — ABNORMAL LOW (ref 13.5–18.0)

## 2015-09-23 LAB — MAGNESIUM: Magnesium: 2.2 mg/dL (ref 1.7–2.4)

## 2015-09-23 SURGERY — ECHOCARDIOGRAM, TRANSESOPHAGEAL
Anesthesia: Monitor Anesthesia Care

## 2015-09-23 MED ORDER — SPIRONOLACTONE 25 MG PO TABS
12.5000 mg | ORAL_TABLET | Freq: Every day | ORAL | Status: DC
  Administered 2015-09-23 – 2015-09-25 (×3): 12.5 mg via ORAL
  Filled 2015-09-23 (×3): qty 1

## 2015-09-23 MED ORDER — FUROSEMIDE 40 MG PO TABS
40.0000 mg | ORAL_TABLET | Freq: Every day | ORAL | Status: DC
  Administered 2015-09-23 – 2015-09-25 (×3): 40 mg via ORAL
  Filled 2015-09-23 (×3): qty 1

## 2015-09-23 MED ORDER — POTASSIUM CHLORIDE CRYS ER 20 MEQ PO TBCR
60.0000 meq | EXTENDED_RELEASE_TABLET | Freq: Once | ORAL | Status: AC
Start: 2015-09-23 — End: 2015-09-23
  Administered 2015-09-23: 60 meq via ORAL
  Filled 2015-09-23: qty 3

## 2015-09-23 NOTE — Progress Notes (Signed)
Advanced Heart Failure Rounding Note   Subjective:    65 year old male without prior cardiac history who presented to the ED with progressive dyspnea and fatigue and was found to be in rapid atrial fibrillation after recent flu-like illness. EF found to be 15%  Initial Co-ox 37%  Started on milrinone 0.25 and IV amio.   Patient with massive RP bleed on 6/13 treated with transfusion, IVF and vasopressin. BP improved today but co-ox way down so started on milrinone for recurrent cardiogenic shock. Off vasopressin. Now on  milrinone and amio. Back in AF in 90s  Feeling better. Repeat CT with no extension of RP bleed. Co-ox 72% Hgb 9.2 CVP 8     Objective:   Weight Range:  Vital Signs:   Temp:  [97.5 F (36.4 C)-98.7 F (37.1 C)] 97.8 F (36.6 C) (06/15 1200) Pulse Rate:  [37-126] 84 (06/15 1400) Resp:  [15-29] 15 (06/15 1400) BP: (89-146)/(41-104) 130/74 mmHg (06/15 1400) SpO2:  [89 %-100 %] 95 % (06/15 1400) Weight:  [84.7 kg (186 lb 11.7 oz)] 84.7 kg (186 lb 11.7 oz) (06/15 0500) Last BM Date: 09/22/15  Weight change: Filed Weights   09/21/15 2300 09/22/15 0500 09/23/15 0500  Weight: 80.9 kg (178 lb 5.6 oz) 83.1 kg (183 lb 3.2 oz) 84.7 kg (186 lb 11.7 oz)    Intake/Output:   Intake/Output Summary (Last 24 hours) at 09/23/15 1538 Last data filed at 09/23/15 1400  Gross per 24 hour  Intake 2341.2 ml  Output   2750 ml  Net -408.8 ml     Physical Exam: General:  In bed. Looks better HEENT: normal Neck: supple. JVP ~8. Carotids 2+ bilat; no bruits. No thyromegaly or nodule noted.  Cor: PMI laterally displaced. Tachy IRR, IRR + s3 Lungs: CTS Abdomen: +tender, + mildly distended. No hepatosplenomegaly. No bruits or masses. Good bowel sounds. Extremities: no cyanosis, clubbing, rash, edema  Neuro: alert & orientedx3, cranial nerves grossly intact. moves all 4 extremities w/o difficulty. Affect pleasant  Telemetry: AF 90s  Labs: Basic Metabolic Panel:  Recent  Labs Lab 09/20/15 0433 09/21/15 0445 09/21/15 0855 09/21/15 1924 09/21/15 2145 09/22/15 0315 09/22/15 0620 09/22/15 1203 09/23/15 0545  NA 130* 126*  --  125* 127* 126*  --  130* 129*  K 4.6 4.5  --  5.1 4.8 5.2*  --  4.4 3.5  CL 91* 86*  --  90* 92* 96*  --  98* 92*  CO2 30 28  --  26  --  22  --  25 27  GLUCOSE 90 139*  --  136* 179* 222*  --  154* 169*  BUN 19 21*  --  28* 27* 31*  --  29* 25*  CREATININE 0.97 0.99  --  1.68* 1.50* 1.39*  --  1.01 1.02  CALCIUM 8.9 9.4  --  8.4*  --  7.5*  --  7.9* 8.1*  MG 2.1  --  1.9 2.0  --   --  1.9  --  2.2  PHOS  --   --   --  4.8*  --  4.6  --   --  3.2    Liver Function Tests:  Recent Labs Lab 09/18/15 0329 09/19/15 0429 09/20/15 0433 09/21/15 1924 09/22/15 0315 09/22/15 0620  AST 247* 96* 78* 63*  --  63*  ALT 527* 349* 311* 198*  --  149*  ALKPHOS 89 91 101 87  --  75  BILITOT 1.2 0.9 1.2  1.0  --  1.8*  PROT 5.8* 5.2* 6.2* 5.0*  --  5.1*  ALBUMIN 3.4* 3.1* 3.5 3.1* 2.9* 2.9*   No results for input(s): LIPASE, AMYLASE in the last 168 hours. No results for input(s): AMMONIA in the last 168 hours.  CBC:  Recent Labs Lab 09/17/15 0415  09/21/15 1924 09/21/15 2140  09/22/15 0315 09/22/15 0620 09/22/15 1203 09/22/15 1657 09/23/15 0115 09/23/15 0545 09/23/15 1130  WBC 19.9*  < > 14.3* 14.6*  --  21.2* 22.9*  --   --   --  19.5*  --   NEUTROABS 16.9*  --   --   --   --   --  21.2*  --   --   --  17.2*  --   HGB 13.3  < > 10.9* 8.8*  < > 8.9* 8.6* 7.2* 7.6* 9.1* 8.9* 9.2*  HCT 40.4  < > 32.5* 26.9*  < > 26.7* 25.7* 21.0* 22.0* 27.4* 26.6* 27.8*  MCV 88.4  < > 86.4 86.8  --  84.2 85.1  --   --   --  82.9  --   PLT 259  < > 215 204  --  163 153  --   --   --  144*  --   < > = values in this interval not displayed.  Cardiac Enzymes:  Recent Labs Lab 09/17/15 0416  CKTOTAL 770*  CKMB 32.9*    BNP: BNP (last 3 results)  Recent Labs  09/15/15 1109  BNP 919.3*    ProBNP (last 3 results) No results for  input(s): PROBNP in the last 8760 hours.    Other results:  Imaging: Ct Abdomen Pelvis Wo Contrast  09/23/2015  CLINICAL DATA:  Followup retroperitoneal hematoma. Previously on heparin drip for atrial fibrillation. EXAM: CT ABDOMEN AND PELVIS WITHOUT CONTRAST TECHNIQUE: Multidetector CT imaging of the abdomen and pelvis was performed following the standard protocol without IV contrast. COMPARISON:  Two days ago FINDINGS: Lower chest and abdominal wall: Small right pleural effusion with right basilar atelectasis. The pleural fluid is low-density. Bilateral inguinal hernia, containing nonobstructed sigmoid colon on the left. Hepatobiliary: No focal liver abnormality.No evidence of biliary obstruction or stone. Pancreas: Unremarkable. Spleen: Unremarkable. Adrenals/Urinary Tract: Negative adrenals. Anteriorly displaced right kidney due to posterior para renal hematoma. No subcapsular hematoma or prominent accumulation around the hilar vessels to suggest renal cause. Presumed small upper pole cyst on the right and lower pole on the left. No hydronephrosis. Bladder is decompressed by a Foley catheter. Reproductive:Negative. Stomach/Bowel: No obstruction or bowel wall thickening. Uncomplicated duodenal diverticulum. Vascular/Lymphatic: Unchanged extent and shape of a large retroperitoneal hematoma on the right mainly accumulated in the posterior pararenal and preaortic retroperitoneum. The hemorrhage extends from the diaphragm into the right pelvis and measures up to 14 cm at the level of the pelvic inlet. No noted continuity with the aorta, which is non aneurysmal. As above, there is no subcapsular renal component or accumulation around the renal hilar vessels. Peritoneal: Small low-density ascites. Musculoskeletal: Degenerative changes without acute finding. IMPRESSION: 1. Unchanged large retroperitoneal hematoma on the right. 2. Increased but small right pleural effusion with right lower lobe atelectasis. 3.  Chronic findings are stable and described above. Electronically Signed   By: Marnee Spring M.D.   On: 09/23/2015 07:46   Ct Abdomen Pelvis Wo Contrast  09/21/2015  CLINICAL DATA:  Hypotension. Worsening right lower quadrant and right groin pain. Decreased hemoglobin since this morning. On heparin. EXAM: CT ABDOMEN  AND PELVIS WITHOUT CONTRAST TECHNIQUE: Multidetector CT imaging of the abdomen and pelvis was performed following the standard protocol without IV contrast. COMPARISON:  CT chest 09/16/2015 FINDINGS: Focal atelectasis or consolidation in the right lung base. Mild dependent changes in the left lung base. Small right pleural effusion. Pneumonia not excluded. There is a large retroperitoneal hematoma extending from the right upper quadrant involving the para renal space and extending throughout the right retroperitoneum to the iliopsoas region and along the right pelvis down into the groin region. At its largest extent, the hematoma measures about 8.8 x 17.6 cm. The hematoma displaces the right kidney anteriorly and displaces the IVC towards the left. The aorta is also displaced towards the left and appears grossly intact. Aortic calcifications are present. No aneurysm. Sub cm low-attenuation lesions are present in the liver. These are too small to characterize but likely represent small cysts. Spleen is small in size. The unenhanced appearance of the gallbladder, pancreas, adrenal glands, kidneys, and retroperitoneal lymph nodes is unremarkable. Stomach, small bowel, and colon are not abnormally distended. No free air in the abdomen. Pelvis: Bladder is decompressed with a Foley catheter. Bilateral inguinal hernias, right containing fat and hematoma and left containing sigmoid colon. No evidence of proximal obstruction. Prostate gland is enlarged, measuring 5.4 cm diameter. Degenerative changes in the spine. IMPRESSION: Very large retroperitoneal hematoma extending throughout the right abdomen and pelvis  and displaced adjacent organs. Source is not identified. No aortic aneurysm. Prostate gland is enlarged. Bilateral inguinal hernias, left containing sigmoid colon without proximal obstruction. Consolidation in the right lung base with small right pleural effusion. Possible pneumonia. These results will be called to the ordering clinician or representative by the Radiologist Assistant, and communication documented in the PACS or zVision Dashboard. Electronically Signed   By: Burman Nieves M.D.   On: 09/21/2015 21:18     Medications:     Scheduled Medications: . sodium chloride   Intravenous Once  . digoxin  0.25 mg Oral Daily  . insulin aspart  0-9 Units Subcutaneous Q4H  . nicotine  21 mg Transdermal Daily  . sodium chloride flush  10-40 mL Intracatheter Q12H  . sodium chloride flush  3 mL Intravenous Q12H    Infusions: . sodium chloride    . amiodarone 30 mg/hr (09/23/15 1326)  . milrinone 0.25 mcg/kg/min (09/23/15 0600)    PRN Medications: acetaminophen, cyclobenzaprine, fentaNYL (SUBLIMAZE) injection, ipratropium, ondansetron (ZOFRAN) IV, sodium chloride flush, sodium chloride flush   Assessment:   1. New onset atrial fibrillation with rapid ventricular response:  2. Acute combined systolic and diastolic CHF:   --EF 15% 3. Tobacco abuse:  4. AKI - improving 5. Transaminitis 6. Hypokalemia/hyponatremia 7. Retroperitoneal Bleed- 6/13 8. Acute Blood Loss--->RTP   Plan/Discussion:    Much improved after resuscitation. BP, hgb and renal function are stable. Co-ox good on milrinone 0.25. Will cut back to 0.125. Was in NSR yesterday but now back in AF. Rate much improved. Continue amio. Not candidate for [facility] at this time. Can go to SDU. Start low dose lasix and spiro.    Length of Stay: 8  Arvilla Meres, MD 09/23/2015, 3:38 PM  Advanced Heart Failure Team Pager [phone] (M-F; 7a - 4p)  Please contact [facility] for night-coverage after hours (4p -7a ) and  weekends on [email or web address]

## 2015-09-23 NOTE — Progress Notes (Signed)
eLink Physician-Brief Progress Note Patient Name: Reginald Hahn DOB: 12/17/1950 MRN: [id number]   Date of Service  09/23/2015  HPI/Events of Note  Non-sustained or symptomatic VT  eICU Interventions  bmet/mg     Intervention Category Intermediate Interventions: Arrhythmia - evaluation and management  Max Fickle 09/23/2015, 4:26 PM

## 2015-09-23 NOTE — Progress Notes (Signed)
eLink Physician-Brief Progress Note Patient Name: Reginald Hahn DOB: July 07, 1950 MRN: [id number]   Date of Service  09/23/2015  HPI/Events of Note    eICU Interventions  Hypokalemia, repleted      Intervention Category Intermediate Interventions: Electrolyte abnormality - evaluation and management  Max Fickle 09/23/2015, 7:33 PM

## 2015-09-23 NOTE — Progress Notes (Signed)
PULMONARY / CRITICAL CARE MEDICINE   Name: Reginald Hahn MRN: [id number] DOB: Mar 10, 1951    ADMISSION DATE:  09/15/2015 CONSULTATION DATE:  09/21/2015  REFERRING MD:  Vilinda Boehringer, M.D. / IMTS  CHIEF COMPLAINT:  Hypotension  SUBJECTIVE: Hypotensive overnight.  VITAL SIGNS: BP 124/89 mmHg  Pulse 85  Temp(Src) 98.5 F (36.9 C) (Oral)  Resp 19  Ht 6' (1.829 m)  Wt 186 lb 11.7 oz (84.7 kg)  BMI 25.32 kg/m2  SpO2 93%  HEMODYNAMICS: CVP:  [8 mmHg-70 mmHg] 8 mmHg  VENTILATOR SETTINGS:    INTAKE / OUTPUT: I/O last 3 completed shifts: In: 10166.7 [P.O.:980; I.V.:3131.7; Blood:2505; Other:2500; IV Piggyback:1050] Out: 3625 [Urine:3625]  PHYSICAL EXAMINATION: General:  Awake. Alert. No acute distress. Sitting up eating breakfast   HEENT: Dry mucus membranes. No oral ulcers. No scleral injection or icterus.  Cardiovascular:  Slightly tachycardic. No edema. No appreciable JVD.  Pulmonary:  Good aeration & clear to auscultation bilaterally. Normal work of breathing on nasal cannula oxygen. Abdomen: Soft. Normal bowel sounds. Protuberant. Diffusely tender to palpation. Musculoskeletal:  Normal bulk and tone. No joint deformity or effusion appreciated. Neurological:  CN 2-12 grossly in tact. No meningismus. Moving all 4 extremities equally.  Psychiatric:  Mood and affect congruent. Speech normal rhythm, rate & tone. Alert and oriented 3. LABS:  BMET  Recent Labs Lab 09/22/15 0315 09/22/15 1203 09/23/15 0545  NA 126* 130* 129*  K 5.2* 4.4 3.5  CL 96* 98* 92*  CO2 22 25 27   BUN 31* 29* 25*  CREATININE 1.39* 1.01 1.02  GLUCOSE 222* 154* 169*   Electrolytes  Recent Labs Lab 09/21/15 1924 09/22/15 0315 09/22/15 0620 09/22/15 1203 09/23/15 0545  CALCIUM 8.4* 7.5*  --  7.9* 8.1*  MG 2.0  --  1.9  --  2.2  PHOS 4.8* 4.6  --   --  3.2   CBC  Recent Labs Lab 09/22/15 0315 09/22/15 0620  09/22/15 1657 09/23/15 0115 09/23/15 0545  WBC 21.2* 22.9*  --   --   --   19.5*  HGB 8.9* 8.6*  < > 7.6* 9.1* 8.9*  HCT 26.7* 25.7*  < > 22.0* 27.4* 26.6*  PLT 163 153  --   --   --  144*  < > = values in this interval not displayed. Coag's  Recent Labs Lab 09/21/15 2108 09/22/15 0315 09/22/15 1203  APTT 62* 21*  --   INR 1.64* 1.35 1.44   Sepsis Markers  Recent Labs Lab 09/21/15 1925  LATICACIDVEN 1.0   ABG  Recent Labs Lab 09/21/15 1916  PHART 7.509*  PCO2ART 31.6*  PO2ART 77.0*    Liver Enzymes  Recent Labs Lab 09/20/15 0433 09/21/15 1924 09/22/15 0315 09/22/15 0620  AST 78* 63*  --  63*  ALT 311* 198*  --  149*  ALKPHOS 101 87  --  75  BILITOT 1.2 1.0  --  1.8*  ALBUMIN 3.5 3.1* 2.9* 2.9*   Cardiac Enzymes No results for input(s): TROPONINI, PROBNP in the last 168 hours. Glucose  Recent Labs Lab 09/22/15 0731 09/22/15 1118 09/22/15 1543 09/22/15 1922 09/22/15 2308 09/23/15 0321  GLUCAP 154* 140* 167* 219* 107* 129*   Imaging Ct Abdomen Pelvis Wo Contrast  09/23/2015  CLINICAL DATA:  Followup retroperitoneal hematoma. Previously on heparin drip for atrial fibrillation. EXAM: CT ABDOMEN AND PELVIS WITHOUT CONTRAST TECHNIQUE: Multidetector CT imaging of the abdomen and pelvis was performed following the standard protocol without IV contrast. COMPARISON:  Two days  ago FINDINGS: Lower chest and abdominal wall: Small right pleural effusion with right basilar atelectasis. The pleural fluid is low-density. Bilateral inguinal hernia, containing nonobstructed sigmoid colon on the left. Hepatobiliary: No focal liver abnormality.No evidence of biliary obstruction or stone. Pancreas: Unremarkable. Spleen: Unremarkable. Adrenals/Urinary Tract: Negative adrenals. Anteriorly displaced right kidney due to posterior para renal hematoma. No subcapsular hematoma or prominent accumulation around the hilar vessels to suggest renal cause. Presumed small upper pole cyst on the right and lower pole on the left. No hydronephrosis. Bladder is  decompressed by a Foley catheter. Reproductive:Negative. Stomach/Bowel: No obstruction or bowel wall thickening. Uncomplicated duodenal diverticulum. Vascular/Lymphatic: Unchanged extent and shape of a large retroperitoneal hematoma on the right mainly accumulated in the posterior pararenal and preaortic retroperitoneum. The hemorrhage extends from the diaphragm into the right pelvis and measures up to 14 cm at the level of the pelvic inlet. No noted continuity with the aorta, which is non aneurysmal. As above, there is no subcapsular renal component or accumulation around the renal hilar vessels. Peritoneal: Small low-density ascites. Musculoskeletal: Degenerative changes without acute finding. IMPRESSION: 1. Unchanged large retroperitoneal hematoma on the right. 2. Increased but small right pleural effusion with right lower lobe atelectasis. 3. Chronic findings are stable and described above. Electronically Signed   By: Marnee Spring M.D.   On: 09/23/2015 07:46   STUDIES:  CT ABD/PELVIS W/O 6/13: Very large retroperitoneal hematoma throughout right abdomen & pelvis with displaced adjacent organs. Source not identified. No aortic aneurysm. Bilateral inguinal hernias noted. Small right pleural effusion with adjacent opacification.  MICROBIOLOGY: MRSA PCR 6/7:  Negative  ANTIBIOTICS: NONE  SIGNIFICANT EVENTS: 6/07 - Admit 6/13 - Transfer to ICU after Rapid Response for Hypotension r/t RP bleed. Transfused.  6/15 repeat CT stable. Hgb holding   LINES/TUBES: R BRACHIAL 3 LUMEN PICC 6/10 >> PIV X1  DISCUSSION:  65 year old male on systemic anticoagulation with heparin infusion for atrial fibrillation as well as amiodarone infusion. Developed hypovolemic shock secondary to large retroperitoneal bleed. Hemodynamics now stable. Off pressors. CT suggests no extension of hematoma and Hgb stable. We will s/o. Would cont to hold heparin. Will defer further HF treatment to cards.    ASSESSMENT /  PLAN:  CARDIOVASCULAR A:  Shock - Hypovolemic from RP Bleed-->resolved Atrial Fibrillation  P:  Cardiology Following Continuing Digoxin Continue Amio gtt Milrinone per cards Holding Heparin gtt  HEMATOLOGIC A:   Systemic Anticoagulation - With Heparin gtt for A fib on hold. Anemia - Secondary to acute blood loss from RP bleed. Hematoma appears to have stabilized  Leukocytosis - Likely reactive demargination.  P:  Follow H&H and transfuse as needed. CBC and INR in AM. SCDs  PULMONARY A: Acute Hypoxic Respiratory Failure Tobacco Use  P:   Continuous Pulse Ox monitoring Weaning FiO2 for Sat >92% Nicotine Patch 21mg /24hr  RENAL A:   Acute Renal Failure - improved w/ stabilization of hemodynamics.  Hypotonic Hyponatremia  P:   Monitoring UOP Trending electrolytes & renal function daily Replacing electrolytes as indicated  GASTROINTESTINAL A:   Retroperitoneal Hematoma Transaminitis - Improving.  P:   Diet as tolerated  Zofran IV prn  INFECTIOUS A:   No acute issues.  P:   Monitor for fever & signs of infection.  ENDOCRINE A:   Hyperglycemia - No h/o DM.    P:   Accu-Checks q4hr SSI per Low Dose Algorithm  NEUROLOGIC A:   No acute issues.  P:   Monitor closely.  FAMILY  -  Updates: Patient updated bedside.  - Inter-disciplinary family meet or Palliative Care meeting due by:  6/20   8:42 AM 09/23/2015  Attending Note:  65 year old male with hemorrhagic shock due to retroperitoneal hematoma.  S/P transfusion and Hg is stable.  I reviewed CXR myself, no acute disease.  On exam, lungs are clear to auscultation and patient is moving all ext to command.  May transfer out of the ICU and back to the teaching service.  PCCM will sign off on 6/16.    Hemorrhagic shock:  - D/C pressors.  - IVF for resuscitation.  - Monitor for bleeding.  Hemorrhagic anemia:  - Transfuse 1 unit pRBC.  - H&H as ordered.  AMS: due to shock  - Monitor.  -  Address hypotension with volume.  N/V:  - Zofran.  Transaminitis:  - CMET in AM.  - Avoid nephrotoxins.  Tobacco abuse:  - Smoking cessation counseling.  A-fib:  - Hold anti-coagulation.  - Rate control.  - Cardiology following.  Transfer care to teaching service, PCCM will sign 6/16 please call back if needed.  Patient seen and examined, agree with above note.  I dictated the care and orders written for this patient under my direction.  Alyson Reedy, MD [phone]

## 2015-09-24 ENCOUNTER — Inpatient Hospital Stay (HOSPITAL_COMMUNITY): Payer: Self-pay

## 2015-09-24 DIAGNOSIS — R140 Abdominal distension (gaseous): Secondary | ICD-10-CM

## 2015-09-24 DIAGNOSIS — R1031 Right lower quadrant pain: Secondary | ICD-10-CM

## 2015-09-24 DIAGNOSIS — K7589 Other specified inflammatory liver diseases: Secondary | ICD-10-CM

## 2015-09-24 LAB — CARBOXYHEMOGLOBIN
Carboxyhemoglobin: 2 % — ABNORMAL HIGH (ref 0.5–1.5)
METHEMOGLOBIN: 0.7 % (ref 0.0–1.5)
O2 Saturation: 64.9 %
TOTAL HEMOGLOBIN: 10.3 g/dL — AB (ref 13.5–18.0)

## 2015-09-24 LAB — BASIC METABOLIC PANEL
Anion gap: 7 (ref 5–15)
BUN: 18 mg/dL (ref 6–20)
CHLORIDE: 92 mmol/L — AB (ref 101–111)
CO2: 29 mmol/L (ref 22–32)
Calcium: 8.2 mg/dL — ABNORMAL LOW (ref 8.9–10.3)
Creatinine, Ser: 0.83 mg/dL (ref 0.61–1.24)
Glucose, Bld: 128 mg/dL — ABNORMAL HIGH (ref 65–99)
POTASSIUM: 3.8 mmol/L (ref 3.5–5.1)
SODIUM: 128 mmol/L — AB (ref 135–145)

## 2015-09-24 LAB — GLUCOSE, CAPILLARY
GLUCOSE-CAPILLARY: 103 mg/dL — AB (ref 65–99)
GLUCOSE-CAPILLARY: 105 mg/dL — AB (ref 65–99)
GLUCOSE-CAPILLARY: 114 mg/dL — AB (ref 65–99)
GLUCOSE-CAPILLARY: 122 mg/dL — AB (ref 65–99)
GLUCOSE-CAPILLARY: 123 mg/dL — AB (ref 65–99)
GLUCOSE-CAPILLARY: 95 mg/dL (ref 65–99)
Glucose-Capillary: 113 mg/dL — ABNORMAL HIGH (ref 65–99)

## 2015-09-24 LAB — CBC WITH DIFFERENTIAL/PLATELET
Basophils Absolute: 0 10*3/uL (ref 0.0–0.1)
Basophils Relative: 0 %
EOS ABS: 0.1 10*3/uL (ref 0.0–0.7)
EOS PCT: 0 %
HCT: 30.1 % — ABNORMAL LOW (ref 39.0–52.0)
HEMOGLOBIN: 9.8 g/dL — AB (ref 13.0–17.0)
LYMPHS ABS: 0.6 10*3/uL — AB (ref 0.7–4.0)
Lymphocytes Relative: 4 %
MCH: 27.7 pg (ref 26.0–34.0)
MCHC: 32.6 g/dL (ref 30.0–36.0)
MCV: 85 fL (ref 78.0–100.0)
Monocytes Absolute: 1.2 10*3/uL — ABNORMAL HIGH (ref 0.1–1.0)
Monocytes Relative: 7 %
NEUTROS PCT: 89 %
Neutro Abs: 14.9 10*3/uL — ABNORMAL HIGH (ref 1.7–7.7)
PLATELETS: 170 10*3/uL (ref 150–400)
RBC: 3.54 MIL/uL — ABNORMAL LOW (ref 4.22–5.81)
RDW: 16.1 % — ABNORMAL HIGH (ref 11.5–15.5)
WBC: 16.8 10*3/uL — ABNORMAL HIGH (ref 4.0–10.5)

## 2015-09-24 LAB — TYPE AND SCREEN
ABO/RH(D): B POS
ANTIBODY SCREEN: NEGATIVE

## 2015-09-24 LAB — HEMOGLOBIN AND HEMATOCRIT, BLOOD
HCT: 29.7 % — ABNORMAL LOW (ref 39.0–52.0)
HCT: 29.5 % — ABNORMAL LOW (ref 39.0–52.0)
HEMATOCRIT: 28.8 % — AB (ref 39.0–52.0)
HEMOGLOBIN: 9.6 g/dL — AB (ref 13.0–17.0)
HEMOGLOBIN: 9.4 g/dL — AB (ref 13.0–17.0)
Hemoglobin: 9.8 g/dL — ABNORMAL LOW (ref 13.0–17.0)

## 2015-09-24 LAB — MAGNESIUM: MAGNESIUM: 2 mg/dL (ref 1.7–2.4)

## 2015-09-24 MED ORDER — DICYCLOMINE HCL 20 MG PO TABS
20.0000 mg | ORAL_TABLET | Freq: Three times a day (TID) | ORAL | Status: DC
  Administered 2015-09-25 – 2015-09-27 (×9): 20 mg via ORAL
  Filled 2015-09-24 (×9): qty 1

## 2015-09-24 MED ORDER — AMIODARONE HCL 200 MG PO TABS
400.0000 mg | ORAL_TABLET | Freq: Two times a day (BID) | ORAL | Status: DC
  Administered 2015-09-24 – 2015-09-28 (×8): 400 mg via ORAL
  Filled 2015-09-24 (×8): qty 2

## 2015-09-24 MED ORDER — GI COCKTAIL ~~LOC~~
30.0000 mL | Freq: Once | ORAL | Status: AC
  Administered 2015-09-25: 30 mL via ORAL
  Filled 2015-09-24: qty 30

## 2015-09-24 MED ORDER — POTASSIUM CHLORIDE 20 MEQ PO PACK
20.0000 meq | PACK | Freq: Every day | ORAL | Status: DC
  Administered 2015-09-25: 20 meq via ORAL
  Filled 2015-09-24 (×2): qty 1

## 2015-09-24 NOTE — Progress Notes (Signed)
Subjective: Interval History On 6/13, he was transferred to the ICU after being found to be in hemorrhagic shock with a large retroperitoneal bleed on abdominal CT. Repeat CBC in the evening showed Hb 10.9, down from 13 earlier in the day. Heparin was discontinued, and he was transferred to the ICU. In the ICU, his hemoglobin nadired at 7.2 with PRBC 3, FFP 4, and aggressive fluid resuscitation. Vasopressin was discontinued after 24 hours on 6/14 though he resumed milrinone and diuretics for cardiogenic shock on 6/15. Hb stabilized at 9 and was consistent with stable findings on repeat abdominal CT.    This morning, he reported feeling better though still with persistent pain in his right side. He also notes feeling lightheaded and forgetful but is grateful for the care he has received. He otherwise denies chest pain or difficulty breathing.  Objective: Vital signs in last 24 hours: Filed Vitals:   09/24/15 0600 09/24/15 0800 09/24/15 0935 09/24/15 0937  BP: 130/83 109/74 117/70   Pulse: 41 84 89 87  Temp:  98.7 F (37.1 C)    TempSrc:  Oral    Resp: Height:      Weight:      SpO2: 98% 96% 96%    Weight change: -1 lb 8.7 oz (-0.7 kg)  Intake/Output Summary (Last 24 hours) at 09/24/15 1128 Last data filed at 09/24/15 1610  Gross per 24 hour  Intake  444.6 ml  Output   2475 ml  Net -2030.4 ml   General: laying in bed, incredibly hard of hearing HEENT: no scleral icterus, no JVD appreciated Cardiac: Irregularly regular rhythm, no murmurs appreciated Pulm: clear to auscultation bilaterally with Abdomen: normoactive bowel sounds, tenderness overlying the right upper and lower quadrant albeit improved from before Neuro: moving all extremities freely, answers all questions appropriately  Lab Results: Basic Metabolic Panel:  Recent Labs Lab 09/22/15 0315  09/23/15 0545 09/23/15 1700 09/24/15 0545  NA 126*  < > 129* 127* 128*  K 5.2*  < > 3.5 3.1* 3.8  CL 96*  <  > 92* 93* 92*  CO2 22  < > GLUCOSE 222*  < > 169* 106* 128*  BUN 31*  < > 25* 22* 18  CREATININE 1.39*  < > 1.02 0.85 0.83  CALCIUM 7.5*  < > 8.1* 7.8* 8.2*  MG  --   < > 2.2  --  2.0  PHOS 4.6  --  3.2  --   --   < > = values in this interval not displayed. Liver Function Tests:  Recent Labs Lab 09/21/15 1924 09/22/15 0315 09/22/15 0620  AST 63*  --  63*  ALT 198*  --  149*  ALKPHOS 87  --  75  BILITOT 1.0  --  1.8*  PROT 5.0*  --  5.1*  ALBUMIN 3.1* 2.9* 2.9*   CBC:  Recent Labs Lab 09/23/15 0545  09/23/15 2310 09/24/15 0545  WBC 19.5*  --   --  16.8*  NEUTROABS 17.2*  --   --  14.9*  HGB 8.9*  < > 9.3* 9.8*  HCT 26.6*  < > 28.5* 30.1*  MCV 82.9  --   --  85.0  PLT 144*  --   --  170  < > = values in this interval not displayed.  Coagulation:  Recent Labs Lab 09/21/15 2108 09/22/15 0315 09/22/15 1203  LABPROT 19.4* 16.8* 17.6*  INR 1.64* 1.35 1.44  Urine Drug Screen: Drugs of Abuse     Component Value Date/Time   LABOPIA NONE DETECTED 09/15/2015 2004   COCAINSCRNUR NONE DETECTED 09/15/2015 2004   LABBENZ POSITIVE* 09/15/2015 2004   AMPHETMU NONE DETECTED 09/15/2015 2004   THCU POSITIVE* 09/15/2015 2004   LABBARB NONE DETECTED 09/15/2015 2004    Micro Results: Recent Results (from the past 240 hour(s))  MRSA PCR Screening     Status: None   Collection Time: 09/15/15  4:19 PM  Result Value Ref Range Status   MRSA by PCR NEGATIVE NEGATIVE Final    Comment:        The GeneXpert MRSA Assay (FDA approved for NASAL specimens only), is one component of a comprehensive MRSA colonization surveillance program. It is not intended to diagnose MRSA infection nor to guide or monitor treatment for MRSA infections.    Studies/Results: Ct Abdomen Pelvis Wo Contrast  09/23/2015  CLINICAL DATA:  Followup retroperitoneal hematoma. Previously on heparin drip for atrial fibrillation. EXAM: CT ABDOMEN AND PELVIS WITHOUT CONTRAST TECHNIQUE:  Multidetector CT imaging of the abdomen and pelvis was performed following the standard protocol without IV contrast. COMPARISON:  Two days ago FINDINGS: Lower chest and abdominal wall: Small right pleural effusion with right basilar atelectasis. The pleural fluid is low-density. Bilateral inguinal hernia, containing nonobstructed sigmoid colon on the left. Hepatobiliary: No focal liver abnormality.No evidence of biliary obstruction or stone. Pancreas: Unremarkable. Spleen: Unremarkable. Adrenals/Urinary Tract: Negative adrenals. Anteriorly displaced right kidney due to posterior para renal hematoma. No subcapsular hematoma or prominent accumulation around the hilar vessels to suggest renal cause. Presumed small upper pole cyst on the right and lower pole on the left. No hydronephrosis. Bladder is decompressed by a Foley catheter. Reproductive:Negative. Stomach/Bowel: No obstruction or bowel wall thickening. Uncomplicated duodenal diverticulum. Vascular/Lymphatic: Unchanged extent and shape of a large retroperitoneal hematoma on the right mainly accumulated in the posterior pararenal and preaortic retroperitoneum. The hemorrhage extends from the diaphragm into the right pelvis and measures up to 14 cm at the level of the pelvic inlet. No noted continuity with the aorta, which is non aneurysmal. As above, there is no subcapsular renal component or accumulation around the renal hilar vessels. Peritoneal: Small low-density ascites. Musculoskeletal: Degenerative changes without acute finding. IMPRESSION: 1. Unchanged large retroperitoneal hematoma on the right. 2. Increased but small right pleural effusion with right lower lobe atelectasis. 3. Chronic findings are stable and described above. Electronically Signed   By: Marnee Spring M.D.   On: 09/23/2015 07:46   Medications: I have reviewed the patient's current medications. Scheduled Meds: . sodium chloride   Intravenous Once  . digoxin  0.25 mg Oral Daily  .  furosemide  40 mg Oral Daily  . insulin aspart  0-9 Units Subcutaneous Q4H  . nicotine  21 mg Transdermal Daily  . sodium chloride flush  10-40 mL Intracatheter Q12H  . sodium chloride flush  3 mL Intravenous Q12H  . spironolactone  12.5 mg Oral Daily   Continuous Infusions: . sodium chloride    . amiodarone 30 mg/hr (09/24/15 0700)  . milrinone 0.125 mcg/kg/min (09/24/15 0700)   PRN Meds:.acetaminophen, cyclobenzaprine, fentaNYL (SUBLIMAZE) injection, ipratropium, ondansetron (ZOFRAN) IV, sodium chloride flush, sodium chloride flush Assessment/Plan:  Mr. Sadek is a 65 year old man with tobacco use hospitalized for atrial fibrillation with RVR found to have heart failure with reduced ejection fraction complicated by hemorrhagic shock secondary to large retroperitoneal bleed, now resolved.  Hemorrhagic shock secondary to large retroperitoneal hematoma: Resolved though continues  to complain of symptoms this morning. Hematoma stable per most recent abdominal CT findings 6/15. Hemoglobin stable at 9 over this interval as well. Normotensive this morning and off vasopressin. -Follow H/H every 6-8 hours through today with daily CBC tomorrow if stable -Holding aspirin and heparin for now  Heart failure with reduced ejection fraction complicated by acute kidney injury and ischemic hepatitis: Likely mediated by arrhythmia though he does have risk factors for ischemic disease as evident by atherosclerosis of the thoracic aorta on CT imaging. Echo 09/15/68 notable for EF 15%, severe diffuse hypokinesis, mid-moderate tricuspid regurg, severe RA dilatation, mild LA dilatation. Unclear plan for cardiac catheterization and cardioversion in light of recent complication though suspect milrinone will have to be weaned. -Continue Lasix 40 mg daily -Continue digoxin 0.25mg  daily -Continue spironolactone 12.5mg  daily -Continue milrinone gtt per Heart Failure recommendations  New onset atrial fibrillation:  Suspicion still remains for viral myocarditis given cardiomegaly on initial chest x-ray in the absence of other causes by workup so far. Converted to sinus rhythm yesterday though back in atrial fibrillation with heart rate trending 84-89 over the last 24 hours.  -Continue telemetry -Continue amiodarone gtt -Holding heparin IV as noted above  Hyponatremia: Suspect initially hypotonic hypervolemic hyponatremia. Urine osmolality over 100 which is consistent with ADH secretions though urine sodium undetectable which is consistent with retention in the setting of decreased effective arterial volume. Sodium 128, stable from admission. -Lasix IV and metolazone as noted above  #FEN:  -Diet: Heart healthy  #DVT prophylaxis: SCDs  #CODE STATUS: FULL CODE -Confirmed with patient on admission  Dispo: Disposition is deferred at this time, awaiting improvement of current medical problems.  .   The patient does not have a current PCP (No primary care provider on file.) and does need an [facility] hospital follow-up appointment after discharge.  The patient does not have transportation limitations that hinder transportation to clinic appointments.  .Services Needed at time of discharge: Y = Yes, Blank = No PT:   OT:   RN:   Equipment:   Other:     LOS: 9 days   Beather Arbour, MD 09/24/2015, 11:28 AM

## 2015-09-24 NOTE — Progress Notes (Signed)
Advanced Heart Failure Rounding Note   Subjective:    65 year old male without prior cardiac history who presented to the ED with progressive dyspnea and fatigue and was found to be in rapid atrial fibrillation after recent flu-like illness. EF found to be 15%  Initial Co-ox 37%  Started on milrinone 0.25 and IV amio.   Patient with massive RP bleed on 6/13 treated with transfusion, IVF and vasopressin. BP improved today but co-ox way down so started on milrinone for recurrent cardiogenic shock. Off vasopressin.   Repeat CT 09/23/15 with no extension of RP bleed.    Hgb 9.8 CVP 7-8  Continues to feel better.  Remains on milrinone and amio. Back in AF in 80-90s. Coox 65% Denies SOB or CP.  No lightheadedness or dizziness but not getting out of bed.   Out 1.9 L and down 1 lb. Creatinine stable.   Objective:   Weight Range:  Vital Signs:   Temp:  [97.8 F (36.6 C)-98.8 F (37.1 C)] 98.7 F (37.1 C) (06/16 0800) Pulse Rate:  [37-126] 87 (06/16 0937) Resp:  [15-28] 26 (06/16 0935) BP: (102-130)/(53-89) 117/70 mmHg (06/16 0935) SpO2:  [90 %-100 %] 96 % (06/16 0935) Weight:  [185 lb 3 oz (84 kg)] 185 lb 3 oz (84 kg) (06/16 0500) Last BM Date: 09/22/15  Weight change: Filed Weights   09/22/15 0500 09/23/15 0500 09/24/15 0500  Weight: 183 lb 3.2 oz (83.1 kg) 186 lb 11.7 oz (84.7 kg) 185 lb 3 oz (84 kg)    Intake/Output:   Intake/Output Summary (Last 24 hours) at 09/24/15 1050 Last data filed at 09/24/15 0938  Gross per 24 hour  Intake  467.5 ml  Output   2475 ml  Net -2007.5 ml     Physical Exam: CVP 7-8 General:  In bed. Looks better HEENT: normal Neck: supple. JVP 7-8. Carotids 2+ bilat; no bruits. No thyromegaly or lymphadenopathy noted.  Cor: PMI laterally displaced. Tachy IRR, IRR + s3 Lungs: CTS Abdomen: +tender, + distended. No hepatosplenomegaly. No bruits or masses. Good bowel sounds. Extremities: no cyanosis, clubbing, rash. No peripheral edema  Neuro:  alert & orientedx3, cranial nerves grossly intact. moves all 4 extremities w/o difficulty. Affect pleasant  Telemetry: AF 80-90s  Labs: Basic Metabolic Panel:  Recent Labs Lab 09/21/15 0855 09/21/15 1924  09/22/15 0315 09/22/15 0620 09/22/15 1203 09/23/15 0545 09/23/15 1700 09/24/15 0545  NA  --  125*  < > 126*  --  130* 129* 127* 128*  K  --  5.1  < > 5.2*  --  4.4 3.5 3.1* 3.8  CL  --  90*  < > 96*  --  98* 92* 93* 92*  CO2  --  26  --  22  --  25 27 26 29   GLUCOSE  --  136*  < > 222*  --  154* 169* 106* 128*  BUN  --  28*  < > 31*  --  29* 25* 22* 18  CREATININE  --  1.68*  < > 1.39*  --  1.01 1.02 0.85 0.83  CALCIUM  --  8.4*  --  7.5*  --  7.9* 8.1* 7.8* 8.2*  MG 1.9 2.0  --   --  1.9  --  2.2  --  2.0  PHOS  --  4.8*  --  4.6  --   --  3.2  --   --   < > = values in this interval not displayed.  Liver Function Tests:  Recent Labs Lab 09/18/15 0329 09/19/15 0429 09/20/15 0433 09/21/15 1924 09/22/15 0315 09/22/15 0620  AST 247* 96* 78* 63*  --  63*  ALT 527* 349* 311* 198*  --  149*  ALKPHOS 89 91 101 87  --  75  BILITOT 1.2 0.9 1.2 1.0  --  1.8*  PROT 5.8* 5.2* 6.2* 5.0*  --  5.1*  ALBUMIN 3.4* 3.1* 3.5 3.1* 2.9* 2.9*   No results for input(s): LIPASE, AMYLASE in the last 168 hours. No results for input(s): AMMONIA in the last 168 hours.  CBC:  Recent Labs Lab 09/21/15 2140  09/22/15 0315 09/22/15 0620  09/23/15 0545 09/23/15 1130 09/23/15 1657 09/23/15 2310 09/24/15 0545  WBC 14.6*  --  21.2* 22.9*  --  19.5*  --   --   --  16.8*  NEUTROABS  --   --   --  21.2*  --  17.2*  --   --   --  14.9*  HGB 8.8*  < > 8.9* 8.6*  < > 8.9* 9.2* 9.0* 9.3* 9.8*  HCT 26.9*  < > 26.7* 25.7*  < > 26.6* 27.8* 27.5* 28.5* 30.1*  MCV 86.8  --  84.2 85.1  --  82.9  --   --   --  85.0  PLT 204  --  163 153  --  144*  --   --   --  170  < > = values in this interval not displayed.  Cardiac Enzymes: No results for input(s): CKTOTAL, CKMB, CKMBINDEX, TROPONINI in the  last 168 hours.  BNP: BNP (last 3 results)  Recent Labs  09/15/15 1109  BNP 919.3*    ProBNP (last 3 results) No results for input(s): PROBNP in the last 8760 hours.    Other results:  Imaging: Ct Abdomen Pelvis Wo Contrast  09/23/2015  CLINICAL DATA:  Followup retroperitoneal hematoma. Previously on heparin drip for atrial fibrillation. EXAM: CT ABDOMEN AND PELVIS WITHOUT CONTRAST TECHNIQUE: Multidetector CT imaging of the abdomen and pelvis was performed following the standard protocol without IV contrast. COMPARISON:  Two days ago FINDINGS: Lower chest and abdominal wall: Small right pleural effusion with right basilar atelectasis. The pleural fluid is low-density. Bilateral inguinal hernia, containing nonobstructed sigmoid colon on the left. Hepatobiliary: No focal liver abnormality.No evidence of biliary obstruction or stone. Pancreas: Unremarkable. Spleen: Unremarkable. Adrenals/Urinary Tract: Negative adrenals. Anteriorly displaced right kidney due to posterior para renal hematoma. No subcapsular hematoma or prominent accumulation around the hilar vessels to suggest renal cause. Presumed small upper pole cyst on the right and lower pole on the left. No hydronephrosis. Bladder is decompressed by a Foley catheter. Reproductive:Negative. Stomach/Bowel: No obstruction or bowel wall thickening. Uncomplicated duodenal diverticulum. Vascular/Lymphatic: Unchanged extent and shape of a large retroperitoneal hematoma on the right mainly accumulated in the posterior pararenal and preaortic retroperitoneum. The hemorrhage extends from the diaphragm into the right pelvis and measures up to 14 cm at the level of the pelvic inlet. No noted continuity with the aorta, which is non aneurysmal. As above, there is no subcapsular renal component or accumulation around the renal hilar vessels. Peritoneal: Small low-density ascites. Musculoskeletal: Degenerative changes without acute finding. IMPRESSION: 1.  Unchanged large retroperitoneal hematoma on the right. 2. Increased but small right pleural effusion with right lower lobe atelectasis. 3. Chronic findings are stable and described above. Electronically Signed   By: Marnee Spring M.D.   On: 09/23/2015 07:46     Medications:  Scheduled Medications: . sodium chloride   Intravenous Once  . digoxin  0.25 mg Oral Daily  . furosemide  40 mg Oral Daily  . insulin aspart  0-9 Units Subcutaneous Q4H  . nicotine  21 mg Transdermal Daily  . sodium chloride flush  10-40 mL Intracatheter Q12H  . sodium chloride flush  3 mL Intravenous Q12H  . spironolactone  12.5 mg Oral Daily    Infusions: . sodium chloride    . amiodarone 30 mg/hr (09/24/15 0700)  . milrinone 0.125 mcg/kg/min (09/24/15 0700)    PRN Medications: acetaminophen, cyclobenzaprine, fentaNYL (SUBLIMAZE) injection, ipratropium, ondansetron (ZOFRAN) IV, sodium chloride flush, sodium chloride flush   Assessment:   1. New onset atrial fibrillation with rapid ventricular response:  2. Acute combined systolic and diastolic CHF:   --EF 15% 3. Tobacco abuse:  4. AKI - improving 5. Transaminitis 6. Hypokalemia/hyponatremia 7. Retroperitoneal Bleed- 6/13 8. Acute Blood Loss--->RTP   Plan/Discussion:    Much improved after resuscitation. BP, hgb and renal function are stable.   Co-ox good on milrinone 0.25 yesterday so cut back to 0.125. Coox pending for today.  If stable will try and wean further.   Was in NSR 09/22/15 but remains in AF. Rate improved into 80-90s.  Continue amio. Not candidate for [facility] at this time.   OK for SDU from HF perspective.    Continue low dose lasix and spiro.    Length of Stay: [address]  Graciella Freer, [place] 09/24/2015, 10:50 AM  Advanced Heart Failure Team Pager [phone] (M-F; 7a - 4p)  Please contact [facility] for night-coverage after hours (4p -7a ) and weekends on [email or web address]   Patient seen and examined with Otilio Saber, PA-C.  We discussed all aspects of the encounter. I agree with the assessment and plan as stated above.   He looks good. Co-ox and CVP stable on milrinone 0.125. Remains on amio. In AF but rate improved. Will continue milrinone today. Likely turn off tomorrow. Change amio to 400 bid. Begin to mobilize. Hgb stable.   Bensimhon, Daniel,MD 3:57 PM

## 2015-09-24 NOTE — Progress Notes (Signed)
Subjective: NAEON. This morning complained of some nausea and abdominal tenderness stretching across his abdomen. Describes as pain as cramping and not as bad as pain during RPH. Ex-wife noted abdomen appears more distended than yesterday. Has been able to drink fluids and pass gas. No appetite but has had a few bites of food. Last BM 3-4 days ago. Attempted to have BM today but only passed gas. Feels "space-y" today.  Objective: Vital signs in last 24 hours: Filed Vitals:   09/24/15 0937 09/24/15 1200 09/24/15 1210 09/24/15 1425  BP:   115/79   Pulse: 87  41   Temp:  98.4 F (36.9 C)  97.7 F (36.5 C)  TempSrc:  Oral  Oral  Resp:   31   Height:      Weight:      SpO2:   96%    Weight change: -0.7 kg (-1 lb 8.7 oz)  Intake/Output Summary (Last 24 hours) at 09/24/15 1448 Last data filed at 09/24/15 1346  Gross per 24 hour  Intake  844.7 ml  Output   2800 ml  Net -1955.3 ml   BP 115/79 mmHg  Pulse 41  Temp(Src) 97.7 F (36.5 C) (Oral)  Resp 31  Ht 6' (1.829 m)  Wt 84 kg (185 lb 3 oz)  BMI 25.11 kg/m2  SpO2 96%  General: Sitting up in bed, appears comfortable, interactive CV: Irregularly irregular. Normal rate. Difficult to assess if extra heart sounds given breath sounds. No murmur heard. No CVD. Respiratory: Reduced breath sounds in basilar fields bilaterally. Wheezing in posterior RLL. No increased work of breathing. On 4L with nasal cannula partially out of nose. Abdomen: Bowel sounds present. Not hyperresonantDistended, tight throughout (not tense), tender to light palpation.  Extremities: Warm, acyanotic, and no edema. Pulses: Peripheral pulses palpable  Lab Results:  Recent Labs Lab 09/22/15 0620  09/23/15 0545  09/23/15 2310 09/24/15 0545 09/24/15 1217  WBC 22.9*  --  19.5*  --   --  16.8*  --   HGB 8.6*  < > 8.9*  < > 9.3* 9.8* 9.8*  HCT 25.7*  < > 26.6*  < > 28.5* 30.1* 29.7*  PLT 153  --  144*  --   --  170  --   < > = values in this interval not  displayed.  Recent Labs Lab 09/20/15 0433  09/21/15 1924  09/22/15 0620  09/23/15 0545 09/23/15 1700 09/24/15 0545  NA 130*  < > 125*  < >  --   < > 129* 127* 128*  K 4.6  < > 5.1  < >  --   < > 3.5 3.1* 3.8  CL 91*  < > 90*  < >  --   < > 92* 93* 92*  CO2 30  < > 26  < >  --   < > 27 26 29   BUN 19  < > 28*  < >  --   < > 25* 22* 18  CREATININE 0.97  < > 1.68*  < >  --   < > 1.02 0.85 0.83  CALCIUM 8.9  < > 8.4*  < >  --   < > 8.1* 7.8* 8.2*  PROT 6.2*  --  5.0*  --  5.1*  --   --   --   --   BILITOT 1.2  --  1.0  --  1.8*  --   --   --   --   ALKPHOS 101  --  87  --  75  --   --   --   --   ALT 311*  --  198*  --  149*  --   --   --   --   AST 78*  --  63*  --  63*  --   --   --   --   GLUCOSE 90  < > 136*  < >  --   < > 169* 106* 128*  < > = values in this interval not displayed.  Studies/Results: Ct Abdomen Pelvis Wo Contrast  09/23/2015  CLINICAL DATA:  Followup retroperitoneal hematoma. Previously on heparin drip for atrial fibrillation. EXAM: CT ABDOMEN AND PELVIS WITHOUT CONTRAST TECHNIQUE: Multidetector CT imaging of the abdomen and pelvis was performed following the standard protocol without IV contrast. COMPARISON:  Two days ago FINDINGS: Lower chest and abdominal wall: Small right pleural effusion with right basilar atelectasis. The pleural fluid is low-density. Bilateral inguinal hernia, containing nonobstructed sigmoid colon on the left. Hepatobiliary: No focal liver abnormality.No evidence of biliary obstruction or stone. Pancreas: Unremarkable. Spleen: Unremarkable. Adrenals/Urinary Tract: Negative adrenals. Anteriorly displaced right kidney due to posterior para renal hematoma. No subcapsular hematoma or prominent accumulation around the hilar vessels to suggest renal cause. Presumed small upper pole cyst on the right and lower pole on the left. No hydronephrosis. Bladder is decompressed by a Foley catheter. Reproductive:Negative. Stomach/Bowel: No obstruction or bowel wall  thickening. Uncomplicated duodenal diverticulum. Vascular/Lymphatic: Unchanged extent and shape of a large retroperitoneal hematoma on the right mainly accumulated in the posterior pararenal and preaortic retroperitoneum. The hemorrhage extends from the diaphragm into the right pelvis and measures up to 14 cm at the level of the pelvic inlet. No noted continuity with the aorta, which is non aneurysmal. As above, there is no subcapsular renal component or accumulation around the renal hilar vessels. Peritoneal: Small low-density ascites. Musculoskeletal: Degenerative changes without acute finding. IMPRESSION: 1. Unchanged large retroperitoneal hematoma on the right. 2. Increased but small right pleural effusion with right lower lobe atelectasis. 3. Chronic findings are stable and described above. Electronically Signed   By: Marnee Spring M.D.   On: 09/23/2015 07:46   Dg Abd Portable 1v  09/24/2015  CLINICAL DATA:  65 year old male with mid abdominal pain for several days. Large retroperitoneal hematoma detected on CT. Initial encounter. EXAM: PORTABLE ABDOMEN - 1 VIEW COMPARISON:  CT Abdomen and Pelvis 09/23/2015 and earlier. FINDINGS: Portable AP supine view at 1319 hours. Abundant gas in nondilated large bowel. Few word gas-filled small bowel loops also appear normal in caliber. Generalized increased density in the right abdomen appears to correspond to the epicenter of the retroperitoneal hematoma demonstrated by CT. No definite pneumoperitoneum on the view. The level of the diaphragm is not included. No acute osseous abnormality identified. IMPRESSION: Stable bowel gas pattern without evidence of mechanical bowel obstruction. Electronically Signed   By: Odessa Fleming M.D.   On: 09/24/2015 13:31   Medications: I have reviewed the patient's current medications. Scheduled Meds: . sodium chloride   Intravenous Once  . digoxin  0.25 mg Oral Daily  . furosemide  40 mg Oral Daily  . insulin aspart  0-9 Units  Subcutaneous Q4H  . nicotine  21 mg Transdermal Daily  . sodium chloride flush  10-40 mL Intracatheter Q12H  . sodium chloride flush  3 mL Intravenous Q12H  . spironolactone  12.5 mg Oral Daily   Continuous Infusions: . sodium chloride    .  amiodarone 30 mg/hr (09/24/15 0700)  . milrinone 0.125 mcg/kg/min (09/24/15 0700)   PRN Meds:.acetaminophen, cyclobenzaprine, fentaNYL (SUBLIMAZE) injection, ipratropium, ondansetron (ZOFRAN) IV, sodium chloride flush, sodium chloride flush Assessment/Plan: Principal Problem:   Acute combined systolic and diastolic heart failure (HCC) Active Problems:   Atrial fibrillation with rapid ventricular response (HCC)   Tobacco abuse   Transaminitis   AKI (acute kidney injury) (HCC)   Atrial fibrillation (HCC)   Cardiogenic shock (HCC)   Retroperitoneal bleed   Hemorrhagic shock  Mr. [name] is a 60yoM with a history of tobacco use who p/w 2 weeks of progressive dyspnea on was found to be in atrial fibrillation with RVR on 6/7 and CHF with EF 15%. Care was advanced to rhythm control, diuresis, and inotropic/vasodilator therapy. On 09/21/15, he developed hypotension following aggressive diuresis and worsening R-side pain and was found to have a large RPH. Heparin was stopped. Transferred to ICU. Required Vasopressin was initiated 6/13-6/14 and received fluid resuscitation, 3u pRBC, and 4u FFP. Following stabilization and no evidence of further bleeding, he was transferred from the ICU to SDU on 6/16.   #Atrial fibrillation, CHF (EF 15%) Most likely 2/2 viral cardiomyopathy vs. tachycardia-induced. Course c/b RPH on 6/13 that occurred after pt had been started on rate/rhythm control, inotropes, up-titration of anti-hypertensives, and diuresis to euvolemia. Rate controlled 80-100s. Pressures stable. Warm and euvolemic. No longer on fluids. Lasix  qdaily restarted 6/14 and spironolactone restarted 6/15. Plan to wean off milrinone and re-add/increase HF meds as  tolerated. Will likely get RHC. LHC may still be useful, despite not being APT-candidate, to assess if cardiomyopathy is ischemic. Is not cardioversion candidate. No for anti-coagulation therapy now or at discharge given RPH, as such is at risk for embolic events. - Continue telemetry - Heart failure consulting - Current rhythm and inotropic regimen is amiodarone and milrinone via PICC and digoxin 0.25mg  qdaily. Switching to po amio 400 po mg 6/16 PM.  - Digoxin level tmrw - Diuresis recs per cards. Lasix  qdaily and spironolactone 12.5mg  qdaily (  prior to ICU).  - If low BPs, re-assess, check H/H, and call PCCM. Tolerated fluid resuscitation and vasopressin in ICU. - Have not restarted Losartan   # Spontaneous RPH, normocytic anemia  Likely 2/2 to heparin. Seen on CT 6/13 in context of hypotension and Hgb drop. Conservative management with stopping heparin, fluids, 3u RBCs, and 4u FFP. Required vasopressin 6/13-6/14. No expansion on repeat non-con CT 6/15. Pressures stable. Hgb stable at 9. Abdominal pain today unlike abdominal compartment syndrome given nml Cr, pain improved on repeat interview, and gas seen diffusely in small bowel on KUB, providing alternative cause for pain. Watch for abdominal protuberance, tense, and worsening pain. - H/H trending q6hr - Type and screen renewed today (72 hrs) - If concern for rebleed (pain, worsening abd exam, hypotensive), call PCCM, check H/H early, and repeat CT. Transfuse and call PCCM if Hgb <8.5.  # Leukocytosis WBC 16.8 today. Trended up during bleed and now down-trending. Has also had intermittent elevated white count during course. Afebrile.  - CBC in AM  #Hypotonic hyponatremia Na 128. Highest during course is 130. Attributed to HF given high urine osm, low urine Na, and initial hypervolemia. Briefly on metolazone. However, patient is euvolemic and only on Lasix. - Daily BMP  #Dyspnea, wheezing, cough Dyspnea most likely 2/2 CHF.  Avoid duonebs given tachycardia.On 4L O2 with reduced breath sounds. Has been unable to ambulate and was supine x3-4 days 2/2 hypotension and ICU.  May have some atelectasis.  - PT as soon as able - Incentive spirometer? - Ipratropium nebs q8h prn SOB and guaifenesin q4hr prn for cough - Consider outpatient PFTs for COPD  #Transaminitis, hypoalbuminemia Downtrending and <1.5-3x ULN on 6/13. At highest, was ALT and AST were 10-11x ULN. 2x ULN on admission.  - Recheck LFTs tomorrow to see if evidence of worsening LFTs/liver damage.  # Tobacco abuse x30 years. 1 ppd  - Patch 21mg  while inpatient.  #VTE Prophylaxis - None given RPH  #FEN/GI - No IVF  - K 3.8 today. Got 1x KCl yest. Will likely need repletion with Lasix restarted. Goal K>4 and Mg>2. - Daily CBC, BMP, and Mg - Heart healthy diet  #Dispo - PT/OT as soon as tolerated - Likely d/c to home vs SNF depending on PT.  - Will need cardiology and PCP follow-up. - Had been seen by [facility] in past but doesn't necessarily want to follow with them. If wants to follow with [facility], consider [facility] as patient does not have insurance.  This is a Psychologist, occupational Note.  The care of the patient was discussed with Dr. Heywood Iles and the assessment and plan formulated with their assistance.  Please see their attached note for official documentation of the daily encounter.   LOS: 9 days   Newton Pigg, Med Student 09/24/2015, 2:48 PM

## 2015-09-24 NOTE — Progress Notes (Signed)
Paged by nursing staff stating patient is complaining of abdominal pain. We saw and evaluated the patient at bedside. He was resting comfortably in bed and appeared to be in no acute distress. Vitals were stable on monitor - not tachycardic or hypotensive. Patient states she has been having abdominal pain since 5 pm despite receiving a dose of Fentanyl. States the pain is 7/10 in intensity and located all over his abdomen. He denies having any nausea or vomiting. States he was able to eat 1/2 of a sandwich and soup for dinner. States his abdominal pain improved after he passed flatus.  Abdominal exam: Bowel sounds present. There is mild tenderness on palpation diffusely. No rebound or rigidity.  Labs from 10:57 pm: Hgb stable at 9.4 making bleed for retroperitoneal hematoma less likely.   -Continue Fentanyl 50 mcg q2 prn  -Bentyl 20 mg TID -GI cocktail -Consider ordering STAT CT of abdomen and pelvis is pain worsens or patient becomes hemodynamically unstable

## 2015-09-25 LAB — HEPATIC FUNCTION PANEL
ALBUMIN: 2.7 g/dL — AB (ref 3.5–5.0)
ALT: 75 U/L — AB (ref 17–63)
AST: 30 U/L (ref 15–41)
Alkaline Phosphatase: 97 U/L (ref 38–126)
Bilirubin, Direct: 0.6 mg/dL — ABNORMAL HIGH (ref 0.1–0.5)
Indirect Bilirubin: 1.1 mg/dL — ABNORMAL HIGH (ref 0.3–0.9)
TOTAL PROTEIN: 5.3 g/dL — AB (ref 6.5–8.1)
Total Bilirubin: 1.7 mg/dL — ABNORMAL HIGH (ref 0.3–1.2)

## 2015-09-25 LAB — BASIC METABOLIC PANEL
Anion gap: 9 (ref 5–15)
BUN: 15 mg/dL (ref 6–20)
CALCIUM: 8.2 mg/dL — AB (ref 8.9–10.3)
CO2: 31 mmol/L (ref 22–32)
CREATININE: 0.78 mg/dL (ref 0.61–1.24)
Chloride: 88 mmol/L — ABNORMAL LOW (ref 101–111)
GFR calc Af Amer: >60 mL/min (ref >60)
GFR calc non Af Amer: >60 mL/min (ref >60)
GLUCOSE: 111 mg/dL — AB (ref 65–99)
Potassium: 3.7 mmol/L (ref 3.5–5.1)
Sodium: 128 mmol/L — ABNORMAL LOW (ref 135–145)

## 2015-09-25 LAB — CBC WITH DIFFERENTIAL/PLATELET
BASOS PCT: 0 %
Basophils Absolute: 0 10*3/uL (ref 0.0–0.1)
EOS PCT: 1 %
Eosinophils Absolute: 0.1 10*3/uL (ref 0.0–0.7)
HEMATOCRIT: 29.8 % — AB (ref 39.0–52.0)
Hemoglobin: 9.6 g/dL — ABNORMAL LOW (ref 13.0–17.0)
Lymphocytes Relative: 7 %
Lymphs Abs: 0.9 10*3/uL (ref 0.7–4.0)
MCH: 27.8 pg (ref 26.0–34.0)
MCHC: 32.2 g/dL (ref 30.0–36.0)
MCV: 86.4 fL (ref 78.0–100.0)
MONO ABS: 1.4 10*3/uL — AB (ref 0.1–1.0)
MONOS PCT: 10 %
NEUTROS ABS: 10.9 10*3/uL — AB (ref 1.7–7.7)
Neutrophils Relative %: 82 %
Platelets: 177 10*3/uL (ref 150–400)
RBC: 3.45 MIL/uL — ABNORMAL LOW (ref 4.22–5.81)
RDW: 15.9 % — AB (ref 11.5–15.5)
WBC: 13.3 10*3/uL — ABNORMAL HIGH (ref 4.0–10.5)

## 2015-09-25 LAB — GLUCOSE, CAPILLARY
GLUCOSE-CAPILLARY: 96 mg/dL (ref 65–99)
GLUCOSE-CAPILLARY: 98 mg/dL (ref 65–99)
GLUCOSE-CAPILLARY: 133 mg/dL — AB (ref 65–99)
GLUCOSE-CAPILLARY: 126 mg/dL — AB (ref 65–99)
GLUCOSE-CAPILLARY: 124 mg/dL — AB (ref 65–99)
GLUCOSE-CAPILLARY: 123 mg/dL — AB (ref 65–99)

## 2015-09-25 LAB — CARBOXYHEMOGLOBIN
Carboxyhemoglobin: 1.8 % — ABNORMAL HIGH (ref 0.5–1.5)
Methemoglobin: 1 % (ref 0.0–1.5)
O2 Saturation: 60.9 %
Total hemoglobin: 10.1 g/dL — ABNORMAL LOW (ref 13.5–18.0)

## 2015-09-25 LAB — MAGNESIUM: MAGNESIUM: 2.1 mg/dL (ref 1.7–2.4)

## 2015-09-25 LAB — HEMOGLOBIN AND HEMATOCRIT, BLOOD
HCT: 31.4 % — ABNORMAL LOW (ref 39.0–52.0)
HEMATOCRIT: 33.2 % — AB (ref 39.0–52.0)
Hemoglobin: 10.1 g/dL — ABNORMAL LOW (ref 13.0–17.0)
Hemoglobin: 10.6 g/dL — ABNORMAL LOW (ref 13.0–17.0)

## 2015-09-25 LAB — DIGOXIN LEVEL: Digoxin Level: 0.6 ng/mL — ABNORMAL LOW (ref 0.8–2.0)

## 2015-09-25 MED ORDER — SPIRONOLACTONE 25 MG PO TABS
25.0000 mg | ORAL_TABLET | Freq: Every day | ORAL | Status: DC
  Administered 2015-09-26 – 2015-09-28 (×3): 25 mg via ORAL
  Filled 2015-09-25 (×3): qty 1

## 2015-09-25 MED ORDER — LOSARTAN POTASSIUM 50 MG PO TABS
25.0000 mg | ORAL_TABLET | Freq: Every day | ORAL | Status: DC
  Administered 2015-09-25: 25 mg via ORAL
  Filled 2015-09-25: qty 1

## 2015-09-25 MED ORDER — INSULIN ASPART 100 UNIT/ML ~~LOC~~ SOLN: 0.0000 [IU] | Freq: Every day | SUBCUTANEOUS | Status: DC

## 2015-09-25 MED ORDER — INSULIN ASPART 100 UNIT/ML ~~LOC~~ SOLN
0.0000 [IU] | Freq: Three times a day (TID) | SUBCUTANEOUS | Status: DC
  Administered 2015-09-26 – 2015-09-27 (×2): 1 [IU] via SUBCUTANEOUS

## 2015-09-25 MED ORDER — POLYETHYLENE GLYCOL 3350 17 G PO PACK
17.0000 g | PACK | Freq: Every day | ORAL | Status: DC
  Administered 2015-09-25: 17 g via ORAL
  Filled 2015-09-25 (×2): qty 1

## 2015-09-25 NOTE — Progress Notes (Signed)
Subjective: Yesterday and overnight, he complained of diffuse abdominal pain but he feels it is improving on his current medications. He denies chest pain or difficulty breathing. He would like to get out of bed if he is able to do so.  Objective: Vital signs in last 24 hours: Filed Vitals:   09/25/15 0000 09/25/15 0011 09/25/15 0342 09/25/15 0600  BP: 104/73 156/53 121/74 126/75  Pulse: 94 93 94 57  Temp:  98.6 F (37 C) 98 F (36.7 C)   TempSrc:  Oral Oral   Resp: 22 23 29 18   Height:      Weight:   185 lb 10 oz (84.2 kg)   SpO2: 93% 95% 94% 94%   Weight change: 7.1 oz (0.2 kg)  Intake/Output Summary (Last 24 hours) at 09/25/15 0711 Last data filed at 09/25/15 0400  Gross per 24 hour  Intake  798.6 ml  Output   1600 ml  Net -801.4 ml   General: resting comfortably in bed, incredibly hard of hearing HEENT: no scleral icterus, no JVD appreciated Cardiac: Irregularly regular rhythm, no murmurs appreciated Pulm: clear to auscultation bilaterally, no respiratory distress Abdomen: normoactive bowel sounds, distended with mild generalized tenderness Neuro: moving all extremities freely, answers all questions appropriately  Lab Results: Basic Metabolic Panel:  Recent Labs Lab 09/22/15 0315  09/23/15 0545  09/24/15 0545 09/25/15 0400  NA 126*  < > 129*  < > 128* 128*  K 5.2*  < > 3.5  < > 3.8 3.7  CL 96*  < > 92*  < > 92* 88*  CO2 22  < > 27  < > 29 31  GLUCOSE 222*  < > 169*  < > 128* 111*  BUN 31*  < > 25*  < > 18 15  CREATININE 1.39*  < > 1.02  < > 0.83 0.78  CALCIUM 7.5*  < > 8.1*  < > 8.2* 8.2*  MG  --   < > 2.2  --  2.0 2.1  PHOS 4.6  --  3.2  --   --   --   < > = values in this interval not displayed. Liver Function Tests:  Recent Labs Lab 09/22/15 0620 09/25/15 0400  AST 63* 30  ALT 149* 75*  ALKPHOS 75 97  BILITOT 1.8* 1.7*  PROT 5.1* 5.3*  ALBUMIN 2.9* 2.7*   CBC:  Recent Labs Lab 09/24/15 0545  09/24/15 2257 09/25/15 0400  WBC 16.8*   --   --  13.3*  NEUTROABS 14.9*  --   --  10.9*  HGB 9.8*  < > 9.4* 9.6*  HCT 30.1*  < > 28.8* 29.8*  MCV 85.0  --   --  86.4  PLT 170  --   --  177  < > = values in this interval not displayed.  Coagulation:  Recent Labs Lab 09/21/15 2108 09/22/15 0315 09/22/15 1203  LABPROT 19.4* 16.8* 17.6*  INR 1.64* 1.35 1.44   Urine Drug Screen: Drugs of Abuse     Component Value Date/Time   LABOPIA NONE DETECTED 09/15/2015 2004   COCAINSCRNUR NONE DETECTED 09/15/2015 2004   LABBENZ POSITIVE* 09/15/2015 2004   AMPHETMU NONE DETECTED 09/15/2015 2004   THCU POSITIVE* 09/15/2015 2004   LABBARB NONE DETECTED 09/15/2015 2004    Micro Results: Recent Results (from the past 240 hour(s))  MRSA PCR Screening     Status: None   Collection Time: 09/15/15  4:19 PM  Result Value Ref Range  Status   MRSA by PCR NEGATIVE NEGATIVE Final    Comment:        The GeneXpert MRSA Assay (FDA approved for NASAL specimens only), is one component of a comprehensive MRSA colonization surveillance program. It is not intended to diagnose MRSA infection nor to guide or monitor treatment for MRSA infections.    Studies/Results: Dg Abd Portable 1v  09/24/2015  CLINICAL DATA:  65 year old male with mid abdominal pain for several days. Large retroperitoneal hematoma detected on CT. Initial encounter. EXAM: PORTABLE ABDOMEN - 1 VIEW COMPARISON:  CT Abdomen and Pelvis 09/23/2015 and earlier. FINDINGS: Portable AP supine view at 1319 hours. Abundant gas in nondilated large bowel. Few word gas-filled small bowel loops also appear normal in caliber. Generalized increased density in the right abdomen appears to correspond to the epicenter of the retroperitoneal hematoma demonstrated by CT. No definite pneumoperitoneum on the view. The level of the diaphragm is not included. No acute osseous abnormality identified. IMPRESSION: Stable bowel gas pattern without evidence of mechanical bowel obstruction. Electronically  Signed   By: Odessa Fleming M.D.   On: 09/24/2015 13:31   Medications: I have reviewed the patient's current medications. Scheduled Meds: . sodium chloride   Intravenous Once  . amiodarone  400 mg Oral BID  . dicyclomine  20 mg Oral TID AC & HS  . digoxin  0.25 mg Oral Daily  . furosemide  40 mg Oral Daily  . insulin aspart  0-9 Units Subcutaneous Q4H  . nicotine  21 mg Transdermal Daily  . potassium chloride  20 mEq Oral Daily  . sodium chloride flush  10-40 mL Intracatheter Q12H  . sodium chloride flush  3 mL Intravenous Q12H  . spironolactone  12.5 mg Oral Daily   Continuous Infusions: . sodium chloride    . milrinone 0.125 mcg/kg/min (09/24/15 1812)   PRN Meds:.acetaminophen, cyclobenzaprine, fentaNYL (SUBLIMAZE) injection, ipratropium, ondansetron (ZOFRAN) IV, sodium chloride flush, sodium chloride flush Assessment/Plan:  Reginald Hahn is a 65 year old man with tobacco use hospitalized for atrial fibrillation with RVR found to have heart failure with reduced ejection fraction complicated by hemorrhagic shock secondary to large retroperitoneal bleed now with mild, generalized abdominal pain.  Generalized abdominal pain: Distension likely from not moving bowels frequently on ongoing use of opiate medication.  -Continue dicyclomine 20mg  3 times before meals and bedtime -Start MiraLax -Ambulate out of bed today as tolerated with PT -Wean oxygen if tolerable -Ordered incentive spirometer  Hemorrhagic shock secondary to large retroperitoneal hematoma: Resolved though continues to complain of symptoms this morning. Hematoma stable per most recent abdominal CT findings 6/15. Hemoglobin stable at 9 this morning from yesterday. Normotensive this morning. -Check CBC tomorrow -Holding aspirin and heparin for now  Heart failure with reduced ejection fraction complicated by acute kidney injury and ischemic hepatitis: Likely mediated by arrhythmia though he does have risk factors for ischemic  disease as evident by atherosclerosis of the thoracic aorta on CT imaging. Echo 09/15/68 notable for EF 15%, severe diffuse hypokinesis, mid-moderate tricuspid regurg, severe RA dilatation, mild LA dilatation. Unclear plan for cardiac catheterization and cardioversion in light of recent complication though suspect milrinone will have to be weaned. -Continue Lasix 40 mg daily -Continue digoxin 0.25mg  daily -Continue spironolactone 12.5mg  daily -Continue milrinone gtt per Heart Failure recommendations though with plan to wean down today  New onset atrial fibrillation: Suspicion still remains for viral myocarditis given cardiomegaly on initial chest x-ray in the absence of other causes by workup so far. Converted  to sinus rhythm yesterday though back in atrial fibrillation with heart rate trending 40s-100s over the last 24 hours.  -Continue telemetry -Continue amiodarone  twice daily  -Holding heparin IV as noted above  Hyponatremia: Suspect initially hypotonic hypervolemic hyponatremia. Urine osmolality over 100 which is consistent with ADH secretions though urine sodium undetectable which is consistent with retention in the setting of decreased effective arterial volume. Sodium 128, stable from admission. -Lasix as noted above  #FEN:  -Diet: Heart healthy  #DVT prophylaxis: SCDs  #CODE STATUS: FULL CODE -Confirmed with patient on admission  Dispo: Disposition is deferred at this time, awaiting improvement of current medical problems.  .   The patient does not have a current PCP (No primary care provider on file.) and does need an [facility] hospital follow-up appointment after discharge.  The patient does not have transportation limitations that hinder transportation to clinic appointments.  .Services Needed at time of discharge: Y = Yes, Blank = No PT:   OT:   RN:   Equipment:   Other:     LOS: 10 days   Beather Arbour, MD 09/25/2015, 7:11 AM

## 2015-09-25 NOTE — Progress Notes (Signed)
Advanced Heart Failure Rounding Note   Subjective:    65 year old male without prior cardiac history who presented to the ED with progressive dyspnea and fatigue and was found to be in rapid atrial fibrillation after recent flu-like illness. EF found to be 15%  Initial Co-ox 37%  Started on milrinone 0.25 and IV amio.   Patient with massive RP bleed on 6/13 treated with transfusion, IVF and vasopressin. BP improved today but co-ox way down so started on milrinone for recurrent cardiogenic shock. Off vasopressin.   Repeat CT 09/23/15 with no extension of RP bleed.    Hgb 9.8-> 10.1 CVP 4-5  Continues to feel better.  Remains on milrinone 0.125. On po amio. In AF in 80-90s. Coox 61% Denies SOB or CP.  No lightheadedness or dizziness but not getting out of bed.   Weight stable.   Objective:   Weight Range:  Vital Signs:   Temp:  [97.7 F (36.5 C)-98.6 F (37 C)] 98.3 F (36.8 C) (06/17 1159) Pulse Rate:  [57-107] 85 (06/17 1159) Resp:  [18-29] 22 (06/17 1159) BP: (104-156)/(53-84) 110/84 mmHg (06/17 1159) SpO2:  [93 %-97 %] 95 % (06/17 1159) Weight:  [84.2 kg (185 lb 10 oz)] 84.2 kg (185 lb 10 oz) (06/17 0342) Last BM Date: 09/23/15  Weight change: Filed Weights   09/23/15 0500 09/24/15 0500 09/25/15 0342  Weight: 84.7 kg (186 lb 11.7 oz) 84 kg (185 lb 3 oz) 84.2 kg (185 lb 10 oz)    Intake/Output:   Intake/Output Summary (Last 24 hours) at 09/25/15 1341 Last data filed at 09/25/15 0919  Gross per 24 hour  Intake  322.8 ml  Output   1550 ml  Net -1227.2 ml     Physical Exam: CVP 4-5 General:  Sitting in chair. eating HEENT: normal Neck: supple. JVP flat  Carotids 2+ bilat; no bruits. No thyromegaly or lymphadenopathy noted.  Cor: PMI laterally displaced. Tachy IRR, IRR + s3 Lungs: CTS Abdomen: +tender, + distended. No hepatosplenomegaly. No bruits or masses. Good bowel sounds. Extremities: no cyanosis, clubbing, rash. No peripheral edema  Neuro: alert &  orientedx3, cranial nerves grossly intact. moves all 4 extremities w/o difficulty. Affect pleasant  Telemetry: AF 80-90s  Labs: Basic Metabolic Panel:  Recent Labs Lab 09/21/15 1924  09/22/15 0315 09/22/15 0620 09/22/15 1203 09/23/15 0545 09/23/15 1700 09/24/15 0545 09/25/15 0400  NA 125*  < > 126*  --  130* 129* 127* 128* 128*  K 5.1  < > 5.2*  --  4.4 3.5 3.1* 3.8 3.7  CL 90*  < > 96*  --  98* 92* 93* 92* 88*  CO2 26  --  22  --  25 27 26 29 31   GLUCOSE 136*  < > 222*  --  154* 169* 106* 128* 111*  BUN 28*  < > 31*  --  29* 25* 22* 18 15  CREATININE 1.68*  < > 1.39*  --  1.01 1.02 0.85 0.83 0.78  CALCIUM 8.4*  --  7.5*  --  7.9* 8.1* 7.8* 8.2* 8.2*  MG 2.0  --   --  1.9  --  2.2  --  2.0 2.1  PHOS 4.8*  --  4.6  --   --  3.2  --   --   --   < > = values in this interval not displayed.  Liver Function Tests:  Recent Labs Lab 09/19/15 0429 09/20/15 0433 09/21/15 1924 09/22/15 0315 09/22/15 0620 09/25/15 0400  AST 96* 78* 63*  --  63* 30  ALT 349* 311* 198*  --  149* 75*  ALKPHOS 91 101 87  --  75 97  BILITOT 0.9 1.2 1.0  --  1.8* 1.7*  PROT 5.2* 6.2* 5.0*  --  5.1* 5.3*  ALBUMIN 3.1* 3.5 3.1* 2.9* 2.9* 2.7*   No results for input(s): LIPASE, AMYLASE in the last 168 hours. No results for input(s): AMMONIA in the last 168 hours.  CBC:  Recent Labs Lab 09/22/15 0315 09/22/15 0620  09/23/15 0545  09/24/15 0545 09/24/15 1217 09/24/15 1645 09/24/15 2257 09/25/15 0400 09/25/15 1107  WBC 21.2* 22.9*  --  19.5*  --  16.8*  --   --   --  13.3*  --   NEUTROABS  --  21.2*  --  17.2*  --  14.9*  --   --   --  10.9*  --   HGB 8.9* 8.6*  < > 8.9*  < > 9.8* 9.8* 9.6* 9.4* 9.6* 10.1*  HCT 26.7* 25.7*  < > 26.6*  < > 30.1* 29.7* 29.5* 28.8* 29.8* 31.4*  MCV 84.2 85.1  --  82.9  --  85.0  --   --   --  86.4  --   PLT 163 153  --  144*  --  170  --   --   --  177  --   < > = values in this interval not displayed.  Cardiac Enzymes: No results for input(s): CKTOTAL,  CKMB, CKMBINDEX, TROPONINI in the last 168 hours.  BNP: BNP (last 3 results)  Recent Labs  09/15/15 1109  BNP 919.3*    ProBNP (last 3 results) No results for input(s): PROBNP in the last 8760 hours.    Other results:  Imaging: Dg Abd Portable 1v  09/24/2015  CLINICAL DATA:  65 year old male with mid abdominal pain for several days. Large retroperitoneal hematoma detected on CT. Initial encounter. EXAM: PORTABLE ABDOMEN - 1 VIEW COMPARISON:  CT Abdomen and Pelvis 09/23/2015 and earlier. FINDINGS: Portable AP supine view at 1319 hours. Abundant gas in nondilated large bowel. Few word gas-filled small bowel loops also appear normal in caliber. Generalized increased density in the right abdomen appears to correspond to the epicenter of the retroperitoneal hematoma demonstrated by CT. No definite pneumoperitoneum on the view. The level of the diaphragm is not included. No acute osseous abnormality identified. IMPRESSION: Stable bowel gas pattern without evidence of mechanical bowel obstruction. Electronically Signed   By: Odessa Fleming M.D.   On: 09/24/2015 13:31     Medications:     Scheduled Medications: . sodium chloride   Intravenous Once  . amiodarone  400 mg Oral BID  . dicyclomine  20 mg Oral TID AC & HS  . digoxin  0.25 mg Oral Daily  . furosemide  40 mg Oral Daily  . insulin aspart  0-9 Units Subcutaneous Q4H  . nicotine  21 mg Transdermal Daily  . polyethylene glycol  17 g Oral Daily  . potassium chloride  20 mEq Oral Daily  . sodium chloride flush  10-40 mL Intracatheter Q12H  . sodium chloride flush  3 mL Intravenous Q12H  . spironolactone  12.5 mg Oral Daily    Infusions: . sodium chloride    . milrinone 0.125 mcg/kg/min (09/24/15 1812)    PRN Medications: acetaminophen, cyclobenzaprine, fentaNYL (SUBLIMAZE) injection, ipratropium, ondansetron (ZOFRAN) IV, sodium chloride flush, sodium chloride flush   Assessment:   1. New onset atrial  fibrillation with rapid  ventricular response:  2. Acute combined systolic and diastolic CHF:   --EF 15% 3. Tobacco abuse:  4. AKI - improving 5. Transaminitis 6. Hypokalemia/hyponatremia 7. Retroperitoneal Bleed- 6/13 8. Acute Blood Loss--->RTP   Plan/Discussion:    He looks good. Co-ox and CVP stable on milrinone 0.125. Will stop milrinone. Add losartan. In AF but now rate controlled. Continue po amio. Unable to anticoagulate due to RP bleed.  Consult PT.   Consider repeat echo prior to d/c.  Length of Stay: 10  Arvilla Meres, MD  09/25/2015, 1:41 PM  Advanced Heart Failure Team Pager [phone] (M-F; 7a - 4p)  Please contact [facility] for night-coverage after hours (4p -7a ) and weekends on [email or web address]

## 2015-09-26 ENCOUNTER — Inpatient Hospital Stay (HOSPITAL_COMMUNITY): Payer: Self-pay

## 2015-09-26 DIAGNOSIS — I509 Heart failure, unspecified: Secondary | ICD-10-CM

## 2015-09-26 LAB — ECHOCARDIOGRAM LIMITED
CHL CUP STROKE VOLUME: 59 mL
FS: 17 % — AB (ref 28–44)
HEIGHTINCHES: 72 in
IVS/LV PW RATIO, ED: 0.94
LA diam end sys: 41 mm
LADIAMINDEX: 2.01 cm/m2
LASIZE: 41 mm
LDCA: 3.14 cm2
LV PW d: 12.6 mm — AB (ref 0.6–1.1)
LV SIMPSON'S DISK: 44
LV sys vol: 76 mL — AB (ref 21–61)
LVDIAVOL: 135 mL (ref 62–150)
LVDIAVOLIN: 66 mL/m2
LVOTD: 20 mm
LVSYSVOLIN: 37 mL/m2
Weight: 2874.8 oz

## 2015-09-26 LAB — CBC WITH DIFFERENTIAL/PLATELET
BASOS ABS: 0 10*3/uL (ref 0.0–0.1)
BASOS PCT: 0 %
EOS ABS: 0.2 10*3/uL (ref 0.0–0.7)
EOS PCT: 1 %
HCT: 34.2 % — ABNORMAL LOW (ref 39.0–52.0)
Hemoglobin: 11 g/dL — ABNORMAL LOW (ref 13.0–17.0)
Lymphocytes Relative: 9 %
Lymphs Abs: 1.4 10*3/uL (ref 0.7–4.0)
MCH: 27.8 pg (ref 26.0–34.0)
MCHC: 32.2 g/dL (ref 30.0–36.0)
MCV: 86.4 fL (ref 78.0–100.0)
MONO ABS: 1.2 10*3/uL — AB (ref 0.1–1.0)
Monocytes Relative: 8 %
Neutro Abs: 12.7 10*3/uL — ABNORMAL HIGH (ref 1.7–7.7)
Neutrophils Relative %: 82 %
PLATELETS: 257 10*3/uL (ref 150–400)
RBC: 3.96 MIL/uL — ABNORMAL LOW (ref 4.22–5.81)
RDW: 15.8 % — AB (ref 11.5–15.5)
WBC: 15.5 10*3/uL — ABNORMAL HIGH (ref 4.0–10.5)

## 2015-09-26 LAB — GLUCOSE, CAPILLARY
GLUCOSE-CAPILLARY: 104 mg/dL — AB (ref 65–99)
Glucose-Capillary: 129 mg/dL — ABNORMAL HIGH (ref 65–99)
Glucose-Capillary: 120 mg/dL — ABNORMAL HIGH (ref 65–99)
Glucose-Capillary: 93 mg/dL (ref 65–99)

## 2015-09-26 LAB — HEMOGLOBIN AND HEMATOCRIT, BLOOD
HEMATOCRIT: 31.4 % — AB (ref 39.0–52.0)
HEMOGLOBIN: 10.1 g/dL — AB (ref 13.0–17.0)

## 2015-09-26 MED ORDER — POTASSIUM CHLORIDE CRYS ER 20 MEQ PO TBCR
20.0000 meq | EXTENDED_RELEASE_TABLET | Freq: Every day | ORAL | Status: DC
  Administered 2015-09-26 – 2015-09-28 (×3): 20 meq via ORAL
  Filled 2015-09-26 (×3): qty 1

## 2015-09-26 MED ORDER — FUROSEMIDE 40 MG PO TABS
40.0000 mg | ORAL_TABLET | Freq: Two times a day (BID) | ORAL | Status: DC
  Administered 2015-09-26 – 2015-09-27 (×2): 40 mg via ORAL
  Filled 2015-09-26 (×2): qty 1

## 2015-09-26 MED ORDER — LOSARTAN POTASSIUM 25 MG PO TABS
25.0000 mg | ORAL_TABLET | Freq: Two times a day (BID) | ORAL | Status: DC
  Administered 2015-09-26 – 2015-09-28 (×4): 25 mg via ORAL
  Filled 2015-09-26 (×4): qty 1

## 2015-09-26 NOTE — Progress Notes (Signed)
  Echocardiogram 2D Echocardiogram has been performed.  Reginald Hahn 09/26/2015, 10:38 AM

## 2015-09-26 NOTE — Progress Notes (Addendum)
Advanced Heart Failure Rounding Note   Subjective:    65 year old male without prior cardiac history who presented to the ED with progressive dyspnea and fatigue and was found to be in rapid atrial fibrillation after recent flu-like illness. EF found to be 15%  Initial Co-ox 37%  Started on milrinone 0.25 and IV amio.   Patient with massive RP bleed on 6/13 treated with transfusion, IVF and vasopressin. BP improved but co-ox way down so milrinone restarted.    Repeat CT 09/23/15 with no extension of RP bleed.    Hgb up to 11.0  Continues to feel better.  Had good BM. Milrinone stopped yesterday. On po amio. In AF in 80-90s. Denies SOB or CP.  Wants to walk    Weight down.  Objective:   Weight Range:  Vital Signs:   Temp:  [97.9 F (36.6 C)-98.4 F (36.9 C)] 98.1 F (36.7 C) (06/18 0825) Pulse Rate:  [67-85] 76 (06/18 0825) Resp:  [19-29] 24 (06/18 0825) BP: (100-126)/(65-94) 111/94 mmHg (06/18 0825) SpO2:  [91 %-97 %] 97 % (06/18 0825) Weight:  [81.5 kg (179 lb 10.8 oz)] 81.5 kg (179 lb 10.8 oz) (06/18 0435) Last BM Date: 09/23/15  Weight change: Filed Weights   09/24/15 0500 09/25/15 0342 09/26/15 0435  Weight: 84 kg (185 lb 3 oz) 84.2 kg (185 lb 10 oz) 81.5 kg (179 lb 10.8 oz)    Intake/Output:   Intake/Output Summary (Last 24 hours) at 09/26/15 0948 Last data filed at 09/26/15 0900  Gross per 24 hour  Intake    600 ml  Output   1900 ml  Net  -1300 ml     Physical Exam: CVP 8 General:  Sitting in bed NAD HEENT: normal Neck: supple. JVP flat  Carotids 2+ bilat; no bruits. No thyromegaly or lymphadenopathy noted.  Cor: PMI laterally displaced.  IRR, IRR + s3 Lungs: CTS Abdomen: +minimally tender, + distended. No hepatosplenomegaly. No bruits or masses. Good bowel sounds. Extremities: no cyanosis, clubbing, rash. No peripheral edema  Neuro: alert & orientedx3, cranial nerves grossly intact. moves all 4 extremities w/o difficulty. Affect  pleasant  Telemetry: AF 80-90s  Labs: Basic Metabolic Panel:  Recent Labs Lab 09/21/15 1924  09/22/15 0315 09/22/15 0620 09/22/15 1203 09/23/15 0545 09/23/15 1700 09/24/15 0545 09/25/15 0400  NA 125*  < > 126*  --  130* 129* 127* 128* 128*  K 5.1  < > 5.2*  --  4.4 3.5 3.1* 3.8 3.7  CL 90*  < > 96*  --  98* 92* 93* 92* 88*  CO2 26  --  22  --  25 27 26 29 31   GLUCOSE 136*  < > 222*  --  154* 169* 106* 128* 111*  BUN 28*  < > 31*  --  29* 25* 22* 18 15  CREATININE 1.68*  < > 1.39*  --  1.01 1.02 0.85 0.83 0.78  CALCIUM 8.4*  --  7.5*  --  7.9* 8.1* 7.8* 8.2* 8.2*  MG 2.0  --   --  1.9  --  2.2  --  2.0 2.1  PHOS 4.8*  --  4.6  --   --  3.2  --   --   --   < > = values in this interval not displayed.  Liver Function Tests:  Recent Labs Lab 09/20/15 0433 09/21/15 1924 09/22/15 0315 09/22/15 0620 09/25/15 0400  AST 78* 63*  --  63* 30  ALT 311* 198*  --  149* 75*  ALKPHOS 101 87  --  75 97  BILITOT 1.2 1.0  --  1.8* 1.7*  PROT 6.2* 5.0*  --  5.1* 5.3*  ALBUMIN 3.5 3.1* 2.9* 2.9* 2.7*   No results for input(s): LIPASE, AMYLASE in the last 168 hours. No results for input(s): AMMONIA in the last 168 hours.  CBC:  Recent Labs Lab 09/22/15 0620  09/23/15 0545  09/24/15 0545  09/25/15 0400 09/25/15 1107 09/25/15 1817 09/26/15 0050 09/26/15 0643  WBC 22.9*  --  19.5*  --  16.8*  --  13.3*  --   --   --  15.5*  NEUTROABS 21.2*  --  17.2*  --  14.9*  --  10.9*  --   --   --  12.7*  HGB 8.6*  < > 8.9*  < > 9.8*  < > 9.6* 10.1* 10.6* 10.1* 11.0*  HCT 25.7*  < > 26.6*  < > 30.1*  < > 29.8* 31.4* 33.2* 31.4* 34.2*  MCV 85.1  --  82.9  --  85.0  --  86.4  --   --   --  86.4  PLT 153  --  144*  --  170  --  177  --   --   --  257  < > = values in this interval not displayed.  Cardiac Enzymes: No results for input(s): CKTOTAL, CKMB, CKMBINDEX, TROPONINI in the last 168 hours.  BNP: BNP (last 3 results)  Recent Labs  09/15/15 1109  BNP 919.3*    ProBNP (last  3 results) No results for input(s): PROBNP in the last 8760 hours.    Other results:  Imaging: Dg Abd Portable 1v  09/24/2015  CLINICAL DATA:  65 year old male with mid abdominal pain for several days. Large retroperitoneal hematoma detected on CT. Initial encounter. EXAM: PORTABLE ABDOMEN - 1 VIEW COMPARISON:  CT Abdomen and Pelvis 09/23/2015 and earlier. FINDINGS: Portable AP supine view at 1319 hours. Abundant gas in nondilated large bowel. Few word gas-filled small bowel loops also appear normal in caliber. Generalized increased density in the right abdomen appears to correspond to the epicenter of the retroperitoneal hematoma demonstrated by CT. No definite pneumoperitoneum on the view. The level of the diaphragm is not included. No acute osseous abnormality identified. IMPRESSION: Stable bowel gas pattern without evidence of mechanical bowel obstruction. Electronically Signed   By: Odessa Fleming M.D.   On: 09/24/2015 13:31     Medications:     Scheduled Medications: . sodium chloride   Intravenous Once  . amiodarone  400 mg Oral BID  . dicyclomine  20 mg Oral TID AC & HS  . digoxin  0.25 mg Oral Daily  . furosemide  40 mg Oral Daily  . insulin aspart  0-5 Units Subcutaneous QHS  . insulin aspart  0-9 Units Subcutaneous TID WC  . losartan  25 mg Oral Daily  . nicotine  21 mg Transdermal Daily  . polyethylene glycol  17 g Oral Daily  . potassium chloride  20 mEq Oral Daily  . sodium chloride flush  10-40 mL Intracatheter Q12H  . sodium chloride flush  3 mL Intravenous Q12H  . spironolactone  25 mg Oral Daily    Infusions: . sodium chloride      PRN Medications: acetaminophen, fentaNYL (SUBLIMAZE) injection, ipratropium, ondansetron (ZOFRAN) IV, sodium chloride flush, sodium chloride flush   Assessment:   1. New onset atrial fibrillation with rapid ventricular response:  This patients CHA2DS2-VASc Score  and unadjusted Ischemic Stroke Rate (% per year) is equal to 2.2 % stroke  rate/year from a score of 2  Above score calculated as 1 point each if present [CHF, HTN, DM, Vascular=MI/PAD/Aortic Plaque, Age if 65-74, or Male] Above score calculated as 2 points each if present [Age > 75, or Stroke/TIA/TE]  2. Acute combined systolic and diastolic CHF:   --EF 15% 3. Tobacco abuse:  4. AKI - improving 5. Transaminitis 6. Hypokalemia/hyponatremia 7. Retroperitoneal Bleed- 6/13 8. Acute Blood Loss--->RTP   Plan/Discussion:    He looks good. Now off milrinone. CVP 8.  Increase losartan (can switch to [facility] when Medicaid approved) . In AF but now rate controlled. Continue po amio. Unable to anticoagulate due to RP bleed.  Consult PT. Can go to tele.  Will repeat echo.  At some point, given AF we may need to reconsider [facility] with Eliquis but given recent RP bleed will defer for now.    I suspect cardiomyopathy is rate-related but does have CRFs. Not good candidate for cath now. Will get Myoview to look for high burden of ischemia.  Length of Stay: 11  Arvilla Meres, MD  09/26/2015, 9:48 AM  Advanced Heart Failure Team Pager [phone] (M-F; 7a - 4p)  Please contact [facility] for night-coverage after hours (4p -7a ) and weekends on [email or web address]

## 2015-09-26 NOTE — Progress Notes (Signed)
Patient transferred to [facility] [place]. Report of condition called to Kandis Fantasia, RN

## 2015-09-26 NOTE — Progress Notes (Signed)
Subjective: This morning, he continues to report feeling better. PT did not see him yesterday though he was able to get up and move around some. He does feel his abdominal pain is improving.   Objective: Vital signs in last 24 hours: Filed Vitals:   09/26/15 0000 09/26/15 0016 09/26/15 0435 09/26/15 0825  BP:  126/68  111/94  Pulse: 80 84  76  Temp:  98.4 F (36.9 C) 98.4 F (36.9 C) 98.1 F (36.7 C)  TempSrc:  Oral Oral Oral  Resp: Height:      Weight:   179 lb 10.8 oz (81.5 kg)   SpO2: 96% 96%  97%   Weight change: -5 lb 15.2 oz (-2.7 kg)  Intake/Output Summary (Last 24 hours) at 09/26/15 1028 Last data filed at 09/26/15 0900  Gross per 24 hour  Intake    600 ml  Output   1900 ml  Net  -1300 ml   General: resting comfortably in bed, incredibly hard of hearing HEENT: no scleral icterus, no JVD appreciated Cardiac: Irregularly regular rhythm, no murmurs appreciated Pulm: clear to auscultation bilaterally, no respiratory distress Abdomen: normoactive bowel sounds, distended with mild generalized tenderness Neuro: moving all extremities freely, answers all questions appropriately  Lab Results: Basic Metabolic Panel:  Recent Labs Lab 09/22/15 0315  09/23/15 0545  09/24/15 0545 09/25/15 0400  NA 126*  < > 129*  < > 128* 128*  K 5.2*  < > 3.5  < > 3.8 3.7  CL 96*  < > 92*  < > 92* 88*  CO2 22  < > 27  < > 29 31  GLUCOSE 222*  < > 169*  < > 128* 111*  BUN 31*  < > 25*  < > 18 15  CREATININE 1.39*  < > 1.02  < > 0.83 0.78  CALCIUM 7.5*  < > 8.1*  < > 8.2* 8.2*  MG  --   < > 2.2  --  2.0 2.1  PHOS 4.6  --  3.2  --   --   --   < > = values in this interval not displayed.  Liver Function Tests:  Recent Labs Lab 09/22/15 0620 09/25/15 0400  AST 63* 30  ALT 149* 75*  ALKPHOS 75 97  BILITOT 1.8* 1.7*  PROT 5.1* 5.3*  ALBUMIN 2.9* 2.7*   CBC:  Recent Labs Lab 09/25/15 0400  09/26/15 0050 09/26/15 0643  WBC 13.3*  --   --  15.5*  NEUTROABS  10.9*  --   --  12.7*  HGB 9.6*  < > 10.1* 11.0*  HCT 29.8*  < > 31.4* 34.2*  MCV 86.4  --   --  86.4  PLT 177  --   --  257  < > = values in this interval not displayed.  Coagulation:  Recent Labs Lab 09/21/15 2108 09/22/15 0315 09/22/15 1203  LABPROT 19.4* 16.8* 17.6*  INR 1.64* 1.35 1.44   Urine Drug Screen: Drugs of Abuse     Component Value Date/Time   LABOPIA NONE DETECTED 09/15/2015 2004   COCAINSCRNUR NONE DETECTED 09/15/2015 2004   LABBENZ POSITIVE* 09/15/2015 2004   AMPHETMU NONE DETECTED 09/15/2015 2004   THCU POSITIVE* 09/15/2015 2004   LABBARB NONE DETECTED 09/15/2015 2004    Micro Results: No results found for this or any previous visit (from the past 240 hour(s)). Studies/Results: Dg Abd Portable 1v  09/24/2015  CLINICAL DATA:  65 year old male with  mid abdominal pain for several days. Large retroperitoneal hematoma detected on CT. Initial encounter. EXAM: PORTABLE ABDOMEN - 1 VIEW COMPARISON:  CT Abdomen and Pelvis 09/23/2015 and earlier. FINDINGS: Portable AP supine view at 1319 hours. Abundant gas in nondilated large bowel. Few word gas-filled small bowel loops also appear normal in caliber. Generalized increased density in the right abdomen appears to correspond to the epicenter of the retroperitoneal hematoma demonstrated by CT. No definite pneumoperitoneum on the view. The level of the diaphragm is not included. No acute osseous abnormality identified. IMPRESSION: Stable bowel gas pattern without evidence of mechanical bowel obstruction. Electronically Signed   By: Odessa Fleming M.D.   On: 09/24/2015 13:31   Medications: I have reviewed the patient's current medications. Scheduled Meds: . sodium chloride   Intravenous Once  . amiodarone  400 mg Oral BID  . dicyclomine  20 mg Oral TID AC & HS  . digoxin  0.25 mg Oral Daily  . furosemide  40 mg Oral BID  . insulin aspart  0-5 Units Subcutaneous QHS  . insulin aspart  0-9 Units Subcutaneous TID WC  . losartan   25 mg Oral BID  . nicotine  21 mg Transdermal Daily  . polyethylene glycol  17 g Oral Daily  . potassium chloride  20 mEq Oral Daily  . sodium chloride flush  10-40 mL Intracatheter Q12H  . sodium chloride flush  3 mL Intravenous Q12H  . spironolactone  25 mg Oral Daily   Continuous Infusions: . sodium chloride     PRN Meds:.acetaminophen, fentaNYL (SUBLIMAZE) injection, ipratropium, ondansetron (ZOFRAN) IV, sodium chloride flush, sodium chloride flush Assessment/Plan:  [name] is a 65 year old man with tobacco use hospitalized for atrial fibrillation with RVR found to have heart failure with reduced ejection fraction complicated by hemorrhagic shock secondary to large retroperitoneal bleed now with mild, generalized abdominal pain.  Generalized abdominal pain: Distension likely from not moving bowels frequently on ongoing use of opiate medication.  -Continue dicyclomine 20mg  3 times before meals and bedtime -Continue scheduled MiraLax -Ambulate out of bed today as tolerated with PT -Wean oxygen if tolerable -Ordered incentive spirometer  Hemorrhagic shock secondary to large retroperitoneal hematoma: Resolved.  Hematoma stable per most recent abdominal CT findings 6/15. Hemoglobin improved to 11 this morning from yesterday. BP initially soft in the 80s-90s though has been in the low 100s over the last 24 hours. -Holding aspirin and heparin for now -Recheck CBC tomorrow  Heart failure with reduced ejection fraction complicated by acute kidney injury and ischemic hepatitis: Likely mediated by arrhythmia though he does have risk factors for ischemic disease as evident by atherosclerosis of the thoracic aorta on CT imaging. Echo 09/15/68 notable for EF 15%, severe diffuse hypokinesis, mid-moderate tricuspid regurg, severe RA dilatation, mild LA dilatation. Cardiac cath deferred for now though plan for Myoview to assess for ischemic disease tomorrow. -Continue Lasix 40 mg twice  daily -Continue digoxin 0.25mg  daily -Continue spironolactone 12.5mg  daily -Continue losartan 25mg  twice daily -Follow-up echo and Myoview results  New onset atrial fibrillation: Suspicion still remains for viral myocarditis given cardiomegaly on initial chest x-ray in the absence of other causes by workup so far. Converted to sinus rhythm yesterday though back in atrial fibrillation with heart rate trending 40s-100s over the last 24 hours.  -Continue telemetry -Continue amiodarone 400mg  twice daily  -Holding heparin IV as noted above  Hyponatremia: Suspect initially hypotonic hypervolemic hyponatremia. Urine osmolality over 100 which is consistent with ADH secretions though urine sodium  undetectable which is consistent with retention in the setting of decreased effective arterial volume. Sodium 128 stable from admission though BMET pending this morning. -Follow-up BMET -Lasix as noted above  #FEN:  -Diet: Heart healthy  #DVT prophylaxis: SCDs  #CODE STATUS: FULL CODE -Confirmed with patient on admission  Dispo: Disposition is deferred at this time, awaiting improvement of current medical problems.  .   The patient does not have a current PCP (No primary care provider on file.) and does need an [facility] hospital follow-up appointment after discharge.  The patient does not have transportation limitations that hinder transportation to clinic appointments.  .Services Needed at time of discharge: Y = Yes, Blank = No PT:   OT:   RN:   Equipment:   Other:     LOS: 11 days   Beather Arbour, MD 09/26/2015, 10:28 AM

## 2015-09-26 NOTE — Evaluation (Signed)
Physical Therapy Evaluation Patient Details Name: Reginald Hahn MRN: [id number] DOB: [birth year] Today's Date: 09/26/2015   History of Present Illness  Reginald Hahn is a 65 year old man with tobacco use hospitalized for atrial fibrillation with RVR found to have heart failure with reduced ejection fraction complicated by hemorrhagic shock secondary to large retroperitoneal bleed now with mild, generalized abdominal pain.     Clinical Impression  Pt admitted with above diagnosis. Pt currently with functional limitations due to the deficits listed below (see PT Problem List). Reginald Hahn presents w/ poor ambulatory endurance and SOB as expected w/ hospital course.  Anticipate that pt will progress quickly w/ all aspects of mobility and will not need PT follow up at d/c.  He currently requires supervision for safety w/ transfers and ambulation.  SpO2 down to 83% on RA while ambulating but this increases to 93% on 1L O2.  He will have 24/7 supervision available from his wife at d/c.  Will update any follow up recommendation as appropriate during hospital stay. Pt will benefit from skilled PT to increase their independence and safety with mobility to allow discharge to the venue listed below.      Follow Up Recommendations No PT follow up;Supervision for mobility/OOB    Equipment Recommendations  None recommended by PT    Recommendations for Other Services OT consult     Precautions / Restrictions Precautions Precautions: Other (comment) Precaution Comments: check BP, monitor O2 Restrictions Weight Bearing Restrictions: No      Mobility  Bed Mobility Overal bed mobility: Needs Assistance Bed Mobility: Supine to Sit     Supine to sit: Min guard;HOB elevated     General bed mobility comments: No cues or physical assist needed.  Slightly increased time.  Transfers Overall transfer level: Needs assistance Equipment used: Rolling walker (2 wheeled) Transfers: Sit to/from Stand Sit  to Stand: Supervision         General transfer comment: Supervision and RW for safety.  BP stable from supine, sit, standing.    Ambulation/Gait Ambulation/Gait assistance: Min guard Ambulation Distance (Feet): 125 Feet Assistive device: Rolling walker (2 wheeled) Gait Pattern/deviations: Step-through pattern;Decreased stride length   Gait velocity interpretation: Below normal speed for age/gender General Gait Details: Steady using RW.  Requires 1 standing rest break due to SOB w/ SpO2 dropping to 83% on RA, requiring 1L O2 w/ SpO2 increasing to 93%.   Stairs            Wheelchair Mobility    Modified Rankin (Stroke Patients Only)       Balance Overall balance assessment: Needs assistance Sitting-balance support: No upper extremity supported;Feet supported Sitting balance-Leahy Scale: Good     Standing balance support: No upper extremity supported;During functional activity Standing balance-Leahy Scale: Fair                               Pertinent Vitals/Pain Pain Assessment: No/denies pain    Home Living Family/patient expects to be discharged to:: Private residence Living Arrangements: Spouse/significant other Available Help at Discharge: Family;Available 24 hours/day Type of Home: House Home Access: Stairs to enter Entrance Stairs-Rails: None Entrance Stairs-Number of Steps: 2 Home Layout: One level Home Equipment: Cane - single point;Shower seat      Prior Function Level of Independence: Independent         Comments: 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 Wife does the driving.  Hand Dominance        Extremity/Trunk Assessment   Upper Extremity Assessment: Defer to OT evaluation           Lower Extremity Assessment: LLE deficits/detail   LLE Deficits / Details: strength grossly 4/5  Cervical / Trunk Assessment: Normal  Communication   Communication: HOH  Cognition Arousal/Alertness:  Awake/alert Behavior During Therapy: WFL for tasks assessed/performed Overall Cognitive Status: Within Functional Limits for tasks assessed                      General Comments      Exercises Other Exercises Other Exercises: Encouraged pt to ambulate w/ nursing staff 3x/day      Assessment/Plan    PT Assessment Patient needs continued PT services  PT Diagnosis Difficulty walking   PT Problem List Decreased strength;Decreased activity tolerance;Decreased balance;Decreased knowledge of use of DME;Decreased safety awareness;Cardiopulmonary status limiting activity  PT Treatment Interventions DME instruction;Gait training;Stair training;Functional mobility training;Therapeutic activities;Therapeutic exercise;Balance training;Patient/family education   PT Goals (Current goals can be found in the Care Plan section) Acute Rehab PT Goals Patient Stated Goal: to go home PT Goal Formulation: With patient/family Time For Goal Achievement: 10/10/15 Potential to Achieve Goals: Good    Frequency Min 3X/week   Barriers to discharge Inaccessible home environment 2 steps to enter home    Co-evaluation               End of Session Equipment Utilized During Treatment: Gait belt;Oxygen Activity Tolerance: Patient tolerated treatment well;Patient limited by fatigue Patient left: in chair;with call bell/phone within reach;with chair alarm set;with family/visitor present Nurse Communication: Mobility status;Other (comment) (SpO2)         Time: 7619-5093 PT Time Calculation (min) (ACUTE ONLY): 26 min   Charges:   PT Evaluation $PT Eval Low Complexity: 1 Procedure PT Treatments $Gait Training: 8-22 mins   PT G Codes:       Encarnacion Chu PT, DPT  Pager: [phone] Phone: [phone] 09/26/2015, 11:25 AM

## 2015-09-27 ENCOUNTER — Telehealth: Payer: Self-pay | Admitting: Internal Medicine

## 2015-09-27 ENCOUNTER — Inpatient Hospital Stay (HOSPITAL_COMMUNITY): Payer: Self-pay

## 2015-09-27 DIAGNOSIS — I5021 Acute systolic (congestive) heart failure: Secondary | ICD-10-CM

## 2015-09-27 LAB — NM MYOCAR MULTI W/SPECT W/WALL MOTION / EF
CHL CUP MPHR: 0 {beats}/min
CHL CUP NUCLEAR SSS: 12
CSEPHR: 0 %
CSEPPHR: 99 {beats}/min
Estimated workload: 1 METS
Exercise duration (min): 0 min
Exercise duration (sec): 0 s
LHR: 0.39
LVDIAVOL: 1 mL (ref 62–150)
LVSYSVOL: 92 mL
NUC STRESS TID: 1
RPE: 0
Rest HR: 72 {beats}/min
SDS: 3
SRS: 11

## 2015-09-27 LAB — CBC WITH DIFFERENTIAL/PLATELET
BASOS ABS: 0 10*3/uL (ref 0.0–0.1)
BASOS PCT: 0 %
EOS ABS: 0.3 10*3/uL (ref 0.0–0.7)
EOS PCT: 2 %
HCT: 34.5 % — ABNORMAL LOW (ref 39.0–52.0)
Hemoglobin: 10.9 g/dL — ABNORMAL LOW (ref 13.0–17.0)
LYMPHS ABS: 1.1 10*3/uL (ref 0.7–4.0)
Lymphocytes Relative: 7 %
MCH: 27.7 pg (ref 26.0–34.0)
MCHC: 31.6 g/dL (ref 30.0–36.0)
MCV: 87.6 fL (ref 78.0–100.0)
Monocytes Absolute: 1.7 10*3/uL — ABNORMAL HIGH (ref 0.1–1.0)
Monocytes Relative: 10 %
Neutro Abs: 13.2 10*3/uL — ABNORMAL HIGH (ref 1.7–7.7)
Neutrophils Relative %: 81 %
PLATELETS: 282 10*3/uL (ref 150–400)
RBC: 3.94 MIL/uL — AB (ref 4.22–5.81)
RDW: 15.9 % — ABNORMAL HIGH (ref 11.5–15.5)
WBC: 16.3 10*3/uL — AB (ref 4.0–10.5)

## 2015-09-27 LAB — BASIC METABOLIC PANEL
ANION GAP: 8 (ref 5–15)
BUN: 13 mg/dL (ref 6–20)
CALCIUM: 8.7 mg/dL — AB (ref 8.9–10.3)
CO2: 30 mmol/L (ref 22–32)
Chloride: 93 mmol/L — ABNORMAL LOW (ref 101–111)
Creatinine, Ser: 0.73 mg/dL (ref 0.61–1.24)
GFR calc Af Amer: >60 mL/min (ref >60)
GLUCOSE: 89 mg/dL (ref 65–99)
Potassium: 3.4 mmol/L — ABNORMAL LOW (ref 3.5–5.1)
SODIUM: 131 mmol/L — AB (ref 135–145)

## 2015-09-27 LAB — TYPE AND SCREEN
ABO/RH(D): B POS
ANTIBODY SCREEN: NEGATIVE

## 2015-09-27 LAB — CBC
HCT: 36.6 % — ABNORMAL LOW (ref 39.0–52.0)
HEMOGLOBIN: 11.7 g/dL — AB (ref 13.0–17.0)
MCH: 28.3 pg (ref 26.0–34.0)
MCHC: 32 g/dL (ref 30.0–36.0)
MCV: 88.4 fL (ref 78.0–100.0)
Platelets: 290 10*3/uL (ref 150–400)
RBC: 4.14 MIL/uL — AB (ref 4.22–5.81)
RDW: 16.1 % — ABNORMAL HIGH (ref 11.5–15.5)
WBC: 17.5 10*3/uL — ABNORMAL HIGH (ref 4.0–10.5)

## 2015-09-27 LAB — GLUCOSE, CAPILLARY
GLUCOSE-CAPILLARY: 141 mg/dL — AB (ref 65–99)
Glucose-Capillary: 91 mg/dL (ref 65–99)
Glucose-Capillary: 105 mg/dL — ABNORMAL HIGH (ref 65–99)
Glucose-Capillary: 114 mg/dL — ABNORMAL HIGH (ref 65–99)

## 2015-09-27 LAB — DIGOXIN LEVEL: DIGOXIN LVL: 0.5 ng/mL — AB (ref 0.8–2.0)

## 2015-09-27 MED ORDER — ALTEPLASE 2 MG IJ SOLR
2.0000 mg | Freq: Once | INTRAMUSCULAR | Status: AC
  Administered 2015-09-27: 2 mg
  Filled 2015-09-27: qty 2

## 2015-09-27 MED ORDER — REGADENOSON 0.4 MG/5ML IV SOLN
0.4000 mg | Freq: Once | INTRAVENOUS | Status: AC
  Administered 2015-09-27 (×2): 0.4 mg via INTRAVENOUS
  Filled 2015-09-27: qty 5

## 2015-09-27 MED ORDER — TECHNETIUM TC 99M TETROFOSMIN IV KIT
10.0000 | PACK | Freq: Once | INTRAVENOUS | Status: AC | PRN
  Administered 2015-09-27: 10 via INTRAVENOUS

## 2015-09-27 MED ORDER — TECHNETIUM TC 99M TETROFOSMIN IV KIT
30.0000 | PACK | Freq: Once | INTRAVENOUS | Status: AC | PRN
  Administered 2015-09-27: 30 via INTRAVENOUS

## 2015-09-27 MED ORDER — REGADENOSON 0.4 MG/5ML IV SOLN
INTRAVENOUS | Status: AC
  Administered 2015-09-27: 0.4 mg via INTRAVENOUS
  Filled 2015-09-27: qty 5

## 2015-09-27 MED ORDER — ALTEPLASE 2 MG IJ SOLR
2.0000 mg | Freq: Once | INTRAMUSCULAR | Status: AC
Start: 2015-09-27 — End: 2015-09-27
  Administered 2015-09-27: 2 mg
  Filled 2015-09-27: qty 2

## 2015-09-27 MED ORDER — POLYETHYLENE GLYCOL 3350 17 G PO PACK: 17.0000 g | PACK | Freq: Every day | ORAL | Status: DC | PRN

## 2015-09-27 MED ORDER — FUROSEMIDE 40 MG PO TABS
40.0000 mg | ORAL_TABLET | Freq: Every day | ORAL | Status: DC
  Administered 2015-09-28: 40 mg via ORAL
  Filled 2015-09-27: qty 1

## 2015-09-27 NOTE — Progress Notes (Signed)
Subjective: NAEON - Transferred from SDU to telemetry unit. Continues to have RLQ abdominal and groin pain with some radiation down leg. Sharp, intermittent. Worse with coughing. No worse with urination or ambulating. Required fentanyl 1x ON and 1x this AM. Prior to this, hadn't required any in ~2 days. Ambulated with PT yesterday and desatted to 80s but improved to 93% on 1L O2. Pt endorses shortness during this exercise. Per pt and nursing, able to stand and ambulate to bathroom well. Had 2 large BMs yesterday.  Dispo planning - Ex-wife to stay with patient in [place] for 5-6 days. Then, he will go to [place] with her b/c she needs to move b/w places in [place].  Objective: Vital signs in last 24 hours: Filed Vitals:   09/26/15 1555 09/26/15 2005 09/27/15 0019 09/27/15 0504  BP: 116/85 119/78 121/70 123/73  Pulse: 80 82 85 72  Temp: 97.6 F (36.4 C) 98.2 F (36.8 C) 97.7 F (36.5 C) 98.2 F (36.8 C)  TempSrc: Oral Oral Oral Oral  Resp: 20 20 18 18   Height: 6' (1.829 m)     Weight: 80.5 kg (177 lb 7.5 oz)   78.699 kg (173 lb 8 oz)  SpO2: 92% 96% 97% 96%   Weight change: -1 kg (-2 lb 3.3 oz)  Intake/Output Summary (Last 24 hours) at 09/27/15 0828 Last data filed at 09/27/15 0655  Gross per 24 hour  Intake   1200 ml  Output   2025 ml  Net   -825 ml   BP 123/73 mmHg  Pulse 72  Temp(Src) 98.2 F (36.8 C) (Oral)  Resp 18  Ht 6' (1.829 m)  Wt 78.699 kg (173 lb 8 oz)  BMI 23.53 kg/m2  SpO2 96%  General: Comfortable lying in bed, no acute distress, suppO2 2.5L CV: Irregularly irregular. Non-tachycardic. Normal S1S2. No m/r/g. Heart sounds displaced to the left  Respiratory: CTAB without wheezing. Inconsistent crackles in basilar lungs. Reduced breath sounds in RML. No increased work of breathing. Abdomen: Normoactive bowel sounds. Mild distension. Fullness palpated in RLQ and area is tender. Mild tenderness in RLQ when L abdomen palpated. No rebound or  guarding Extremities: Warm, acyanotic, and no edema. Pulses: Peripheral pulses 2+  Lab Results:  Recent Labs Lab 09/25/15 0400  09/26/15 0050 09/26/15 0643 09/27/15 0425  WBC 13.3*  --   --  15.5* 16.3*  HGB 9.6*  < > 10.1* 11.0* 10.9*  HCT 29.8*  < > 31.4* 34.2* 34.5*  PLT 177  --   --  257 282  < > = values in this interval not displayed.  Recent Labs Lab 09/21/15 1924  09/22/15 0620  09/24/15 0545 09/25/15 0400 09/27/15 0425  NA 125*  < >  --   < > 128* 128* 131*  K 5.1  < >  --   < > 3.8 3.7 3.4*  CL 90*  < >  --   < > 92* 88* 93*  CO2 26  < >  --   < > 29 31 30   BUN 28*  < >  --   < > 18 15 13   CREATININE 1.68*  < >  --   < > 0.83 0.78 0.73  CALCIUM 8.4*  < >  --   < > 8.2* 8.2* 8.7*  PROT 5.0*  --  5.1*  --   --  5.3*  --   BILITOT 1.0  --  1.8*  --   --  1.7*  --  ALKPHOS 87  --  75  --   --  97  --   ALT 198*  --  149*  --   --  75*  --   AST 63*  --  63*  --   --  30  --   GLUCOSE 136*  < >  --   < > 128* 111* 89  < > = values in this interval not displayed.  Studies/Results: ECHO Limited 09/26/15 - - Left ventricle: The cavity size was mildly dilated. Wall  thickness was increased in a pattern of mild LVH. The estimated  ejection fraction was 25%. Diffuse hypokinesis. - Left atrium: The atrium was mildly dilated. - Right atrium: The atrium was mildly dilated. - Atrial septum: No defect or patent foramen ovale was identified. - Impressions: Abnormal GLS - 10.3 RV less dilated than previous  Incomplete study no color flow done other than parasternal images  And no doppler ? limited study for EF.  Medications: I have reviewed the patient's current medications. Scheduled Meds: . amiodarone  400 mg Oral BID  . digoxin  0.25 mg Oral Daily  . furosemide  40 mg Oral BID  . insulin aspart  0-5 Units Subcutaneous QHS  . insulin aspart  0-9 Units Subcutaneous TID WC  . losartan  25 mg Oral BID  . nicotine  21 mg Transdermal Daily  . potassium chloride   20 mEq Oral Daily  . spironolactone  25 mg Oral Daily   Continuous Infusions:  PRN Meds:.acetaminophen, ipratropium, ondansetron (ZOFRAN) IV, sodium chloride flush Assessment/Plan: Principal Problem:   Acute combined systolic and diastolic heart failure (HCC) Active Problems:   Atrial fibrillation with rapid ventricular response (HCC)   Tobacco abuse   Transaminitis   AKI (acute kidney injury) (HCC)   Atrial fibrillation (HCC)   Cardiogenic shock (HCC)   Retroperitoneal bleed   Hemorrhagic shock  Reginald Hahn is a 52yoM with a history of tobacco use and p/w atrial fibrillation with RVR on 6/7 and CHF with EF 15%. Care was advanced to rhythm control, diuresis, and inotropic/vasodilator therapy. Course c/b hemorrhagic shock 2/2 RPH on 09/21/15 and anticoagulation was stopped. In the ICU, he required Vasopressin 6/13-6/14, fluid resuscitation, 3u pRBC, and 4u FFP. No evidence of further bleeding and care has been de-escalated to floor status w/ tele.  #Atrial fibrillation, CHF (EF 15%) Most likely 2/2 viral cardiomyopathy vs. tachycardia-induced. Rate controlled 80s. Pressures stable. Warm and euvolemic. Titrating up Lasix, spironolactone, and losartan. No milrinone x2 days. Stress test today. No plan for catheterization, anticoagulation therapy, or cardioversion in near future given RPH. CHADSVASC score of 2 (2.2% risk of stroke). Digoxin level low.  - Myoview today.  - Continue telemetry - Pull PICC tomorrow prior to d/c - Heart failure consulting - Plan to d/c tmrw - Rhythm control amiodarone  BID. Inotrope digoxin 0.25mg  qdaily.  - Diuresis - Lasix  BID - Pressure control - spironolactone  qdaily, losartan  BID (increase from qdaily yesterday)  - Discuss weight monitoring and limiting salt and fluids  # Spontaneous RPH 6/13, normocytic anemia  Likely 2/2 to heparin. Hgb stable at 11. Increased abd pain this morning likely 2/2 to old bleed vs. re-bleed vs. GI. Old  bleed more likely given stable H/H and stable pressures. No evidence of abdominal compartment syndrome given nml labs and non-protuberant abdomen. - Afternoon H/H given worsening abd pain - D/c fentanyl >> trial acetaminophen  q6h. Had transaminitis during course but this downtrended. If  ineffective, consider ultram. Pt has codeine allergy. - D/c bentyl as it can worsen tachyarrhythmias. - D/c miralax if tolerating non-opioid pain management. - CBC qdaily  - Renew type and screen - If concern for rebleed (pain, worsening abd exam, hypotensive), call PCCM, check H/H, and repeat CT. Transfuse and call PCCM if Hgb <8.5.  # Leukocytosis WBC 16.8 today. Trended up during bleed and now down-trending. Has also had intermittent elevated white count during course. Afebrile.  - CBC in AM  #Hypotonic hyponatremia (improving) Na 131, which is highest Na during course. Attributed to HF given high urine osm, low urine Na, and initial hypervolemia.  - Daily BMP  #Dyspnea, wheezing, cough Dyspnea most likely 2/2 CHF and deconditioning given limiting mobility w/ hypotension and ICU. Avoid duonebs given tachycardia.Ambulated with PT yesterday and desatted to 80s. Nursing to workon O2 wean. - Lots of walking, OOB, and incentive spirometer - Ipratropium nebs q8h prn SOB and guaifenesin q4hr prn for cough - Consider outpatient PFTs for COPD  # Tobacco abuse x30 years. 1 ppd  - Patch 21mg  while inpatient.  #VTE Prophylaxis - None given RPH  #FEN/GI - No IVF  - K 3.4 today. On KCl 20mg  qdaily with Lasix BID. Consider increasing tomorrow if still low when patient eating and on BID Lasix. Goal K>4 and Mg>2. - Daily CBC, BMP, and Mg - Heart healthy diet - SSI given hyperglycemia in ICU but BGs now well-controlled on diet  #Dispo - D/c to home w/o home PT - Will need cardiology and PCP follow-up. - Would like to establish with [facility]. - Counsel on GoodRx for medications  This is a  Psychologist, occupational Note.  The care of the patient was discussed with Dr. Heywood Iles and the assessment and plan formulated with their assistance.  Please see their attached note for official documentation of the daily encounter.   LOS: 12 days   Reginald Hahn, Med Student 09/27/2015, 8:28 AM

## 2015-09-27 NOTE — Progress Notes (Signed)
PT Cancellation Note  Patient Details Name: Reginald Hahn MRN: [id number] DOB: 1950/12/19   Cancelled Treatment:    Reason Eval/Treat Not Completed: Patient at procedure or test/unavailable. Will return to see pt later today, schedule permitting.   Encarnacion Chu PT, DPT  Pager: [phone] Phone: [phone] 09/27/2015, 9:17 AM

## 2015-09-27 NOTE — Progress Notes (Signed)
nuc study was done and results without ischemia + scar.

## 2015-09-27 NOTE — Progress Notes (Signed)
3 min, Reginald Rieger PA at bedside, pt reports SOB is "getting better but still present." Continues to deny CP

## 2015-09-27 NOTE — Telephone Encounter (Signed)
Need TOC discharge date 09/27/15 HFU 09/30/15

## 2015-09-27 NOTE — Progress Notes (Signed)
7 min, Vernona Rieger PA at bedside. Pt denies SOB/CP.  Procedure ended

## 2015-09-27 NOTE — Progress Notes (Signed)
Advanced Heart Failure Rounding Note   Subjective:    65 year old male without prior cardiac history who presented to the ED with progressive dyspnea and fatigue and was found to be in rapid atrial fibrillation after recent flu-like illness. EF found to be 15%  Initial Co-ox 37%  Started on milrinone 0.25 and IV amio.   Patient with massive RP bleed on 6/13 treated with transfusion, IVF and vasopressin. BP improved but co-ox way down so milrinone restarted.    Repeat CT 09/23/15 with no extension of RP bleed.    Hgb up to 11.0  Continues to feel better.  Had good BM. Milrinone stopped yesterday. On po amio. In AF in 80-90s. Denies SOB or CP.  Wants to walk    Weight down.  Objective:   Weight Range:  Vital Signs:   Temp:  [97.6 F (36.4 C)-98.2 F (36.8 C)] 98.2 F (36.8 C) (06/19 0504) Pulse Rate:  [72-85] 72 (06/19 0504) Resp:  [18-21] 18 (06/19 0504) BP: (102-123)/(70-85) 102/72 mmHg (06/19 0940) SpO2:  [92 %-97 %] 96 % (06/19 0504) Weight:  [173 lb 8 oz (78.699 kg)-177 lb 7.5 oz (80.5 kg)] 173 lb 8 oz (78.699 kg) (06/19 0504) Last BM Date: 09/26/15  Weight change: Filed Weights   09/26/15 0435 09/26/15 1555 09/27/15 0504  Weight: 179 lb 10.8 oz (81.5 kg) 177 lb 7.5 oz (80.5 kg) 173 lb 8 oz (78.699 kg)    Intake/Output:   Intake/Output Summary (Last 24 hours) at 09/27/15 1007 Last data filed at 09/27/15 0655  Gross per 24 hour  Intake    840 ml  Output   1725 ml  Net   -885 ml     Physical Exam: CVP 8 General:  Sitting in bed NAD HEENT: normal Neck: supple. JVP flat  Carotids 2+ bilat; no bruits. No thyromegaly or lymphadenopathy noted.  Cor: PMI laterally displaced.  IRR, IRR + s3 Lungs: CTS Abdomen: +minimally tender, + distended. No hepatosplenomegaly. No bruits or masses. Good bowel sounds. Extremities: no cyanosis, clubbing, rash. No peripheral edema  Neuro: alert & orientedx3, cranial nerves grossly intact. moves all 4 extremities w/o  difficulty. Affect pleasant  Telemetry: AF 80-90s  Labs: Basic Metabolic Panel:  Recent Labs Lab 09/21/15 1924  09/22/15 0315 09/22/15 0620  09/23/15 0545 09/23/15 1700 09/24/15 0545 09/25/15 0400 09/27/15 0425  NA 125*  < > 126*  --   < > 129* 127* 128* 128* 131*  K 5.1  < > 5.2*  --   < > 3.5 3.1* 3.8 3.7 3.4*  CL 90*  < > 96*  --   < > 92* 93* 92* 88* 93*  CO2 26  --  22  --   < > 27 26 29 31 30   GLUCOSE 136*  < > 222*  --   < > 169* 106* 128* 111* 89  BUN 28*  < > 31*  --   < > 25* 22* 18 15 13   CREATININE 1.68*  < > 1.39*  --   < > 1.02 0.85 0.83 0.78 0.73  CALCIUM 8.4*  --  7.5*  --   < > 8.1* 7.8* 8.2* 8.2* 8.7*  MG 2.0  --   --  1.9  --  2.2  --  2.0 2.1  --   PHOS 4.8*  --  4.6  --   --  3.2  --   --   --   --   < > =  values in this interval not displayed.  Liver Function Tests:  Recent Labs Lab 09/21/15 1924 09/22/15 0315 09/22/15 0620 09/25/15 0400  AST 63*  --  63* 30  ALT 198*  --  149* 75*  ALKPHOS 87  --  75 97  BILITOT 1.0  --  1.8* 1.7*  PROT 5.0*  --  5.1* 5.3*  ALBUMIN 3.1* 2.9* 2.9* 2.7*   No results for input(s): LIPASE, AMYLASE in the last 168 hours. No results for input(s): AMMONIA in the last 168 hours.  CBC:  Recent Labs Lab 09/23/15 0545  09/24/15 0545  09/25/15 0400 09/25/15 1107 09/25/15 1817 09/26/15 0050 09/26/15 0643 09/27/15 0425  WBC 19.5*  --  16.8*  --  13.3*  --   --   --  15.5* 16.3*  NEUTROABS 17.2*  --  14.9*  --  10.9*  --   --   --  12.7* 13.2*  HGB 8.9*  < > 9.8*  < > 9.6* 10.1* 10.6* 10.1* 11.0* 10.9*  HCT 26.6*  < > 30.1*  < > 29.8* 31.4* 33.2* 31.4* 34.2* 34.5*  MCV 82.9  --  85.0  --  86.4  --   --   --  86.4 87.6  PLT 144*  --  170  --  177  --   --   --  257 282  < > = values in this interval not displayed.  Cardiac Enzymes: No results for input(s): CKTOTAL, CKMB, CKMBINDEX, TROPONINI in the last 168 hours.  BNP: BNP (last 3 results)  Recent Labs  09/15/15 1109  BNP 919.3*    ProBNP (last 3  results) No results for input(s): PROBNP in the last 8760 hours.    Other results:  Imaging: No results found.   Medications:     Scheduled Medications: . amiodarone  400 mg Oral BID  . digoxin  0.25 mg Oral Daily  . furosemide  40 mg Oral BID  . insulin aspart  0-5 Units Subcutaneous QHS  . insulin aspart  0-9 Units Subcutaneous TID WC  . losartan  25 mg Oral BID  . nicotine  21 mg Transdermal Daily  . potassium chloride  20 mEq Oral Daily  . regadenoson      . regadenoson  0.4 mg Intravenous Once  . spironolactone  25 mg Oral Daily    Infusions:    PRN Medications: acetaminophen, ipratropium, ondansetron (ZOFRAN) IV, polyethylene glycol, sodium chloride flush   Assessment:   1. New onset atrial fibrillation with rapid ventricular response:  This patients CHA2DS2-VASc Score and unadjusted Ischemic Stroke Rate (% per year) is equal to 2.2 % stroke rate/year from a score of 2  Above score calculated as 1 point each if present [CHF, HTN, DM, Vascular=MI/PAD/Aortic Plaque, Age if 65-74, or Male] Above score calculated as 2 points each if present [Age > 75, or Stroke/TIA/TE] 2. Acute combined systolic and diastolic CHF:   --EF 15% 3. Tobacco abuse:  4. AKI - improving 5. Transaminitis 6. Hypokalemia/hyponatremia 7. Retroperitoneal Bleed- 6/13 8. Acute Blood Loss--->RTP    Plan/Discussion:   Myoview result pending.   Stable off milrinone. ECHO EF 25% RV normal.   A fib. Controlled rate. Not anticoagulant with recent retroperitoneal bleed.    Length of Stay: 12  Amy Clegg, NP-C   09/27/2015, 10:07 AM  Advanced Heart Failure Team Pager [phone] (M-F; 7a - 4p)  Please contact [facility] for night-coverage after hours (4p -7a ) and weekends on [email or web address]  Patient seen and examined with Tonye Becket, NP. We discussed all aspects of the encounter. I agree with the assessment and plan as stated above.   Still with ab pain. CT stable. Breathing is fine.  Volume status continues to improve. Can drop lasix to 40 daily. Myoview with EF 37% diaphragmatic attenuation. No ischemia or infarct. Suspect tachy-mediated CM.    Continue amio 400 bid. Decrease lasix to 40 daily. Continue spiro and dig. May be ready for d/c from cardiac perspective in next day or two. Hold off on b-blocker for now with recent low output.   Jevante Hollibaugh,MD 2:01 PM

## 2015-09-27 NOTE — Progress Notes (Signed)
5 min, Vernona Rieger PA at bedside. Pt reports SOB is better. Denies CP

## 2015-09-27 NOTE — Progress Notes (Signed)
1 min, Vernona Rieger PA at bedside. Pt reports some SOB, denies CP.

## 2015-09-27 NOTE — Progress Notes (Signed)
Subjective: This morning, he is feeling better and is scheduled to go down for Myoview. He does report abdominal pain which is present at rest though is not noticeable when is ambulating. He does not report chest pain or difficulty breathing.    Objective: Vital signs in last 24 hours: Filed Vitals:   09/26/15 1555 09/26/15 2005 09/27/15 0019 09/27/15 0504  BP: 116/85 119/78 121/70 123/73  Pulse: 80 82 85 72  Temp: 97.6 F (36.4 C) 98.2 F (36.8 C) 97.7 F (36.5 C) 98.2 F (36.8 C)  TempSrc: Oral Oral Oral Oral  Resp: 20 20 18 18   Height: 6' (1.829 m)     Weight: 177 lb 7.5 oz (80.5 kg)   173 lb 8 oz (78.699 kg)  SpO2: 92% 96% 97% 96%   Weight change: -2 lb 3.3 oz (-1 kg)  Intake/Output Summary (Last 24 hours) at 09/27/15 0828 Last data filed at 09/27/15 0131  Gross per 24 hour  Intake   1200 ml  Output   2025 ml  Net   -825 ml   General: resting comfortably in bed, incredibly hard of hearing HEENT: no scleral icterus, no JVD appreciated Cardiac: Irregularly regular rhythm, no murmurs appreciated Pulm: clear to auscultation bilaterally, no respiratory distress Abdomen: normoactive bowel sounds, tenderness noted with palpation of the RLQ & LLQ Neuro: moving all extremities freely, answers all questions appropriately  Lab Results: Basic Metabolic Panel:  Recent Labs Lab 09/22/15 0315  09/23/15 0545  09/24/15 0545 09/25/15 0400 09/27/15 0425  NA 126*  < > 129*  < > 128* 128* 131*  K 5.2*  < > 3.5  < > 3.8 3.7 3.4*  CL 96*  < > 92*  < > 92* 88* 93*  CO2 22  < > 27  < > 29 31 30   GLUCOSE 222*  < > 169*  < > 128* 111* 89  BUN 31*  < > 25*  < > 18 15 13   CREATININE 1.39*  < > 1.02  < > 0.83 0.78 0.73  CALCIUM 7.5*  < > 8.1*  < > 8.2* 8.2* 8.7*  MG  --   < > 2.2  --  2.0 2.1  --   PHOS 4.6  --  3.2  --   --   --   --   < > = values in this interval not displayed.  Liver Function Tests:  Recent Labs Lab 09/22/15 0620 09/25/15 0400  AST 63* 30  ALT 149* 75*    ALKPHOS 75 97  BILITOT 1.8* 1.7*  PROT 5.1* 5.3*  ALBUMIN 2.9* 2.7*   CBC:  Recent Labs Lab 09/26/15 0643 09/27/15 0425  WBC 15.5* 16.3*  NEUTROABS 12.7* 13.2*  HGB 11.0* 10.9*  HCT 34.2* 34.5*  MCV 86.4 87.6  PLT 257 282    Coagulation:  Recent Labs Lab 09/21/15 2108 09/22/15 0315 09/22/15 1203  LABPROT 19.4* 16.8* 17.6*  INR 1.64* 1.35 1.44   Urine Drug Screen: Drugs of Abuse     Component Value Date/Time   LABOPIA NONE DETECTED 09/15/2015 2004   COCAINSCRNUR NONE DETECTED 09/15/2015 2004   LABBENZ POSITIVE* 09/15/2015 2004   AMPHETMU NONE DETECTED 09/15/2015 2004   THCU POSITIVE* 09/15/2015 2004   LABBARB NONE DETECTED 09/15/2015 2004    Micro Results: No results found for this or any previous visit (from the past 240 hour(s)). Studies/Results: No results found. Medications: I have reviewed the patient's current medications. Scheduled Meds: . amiodarone  400  mg Oral BID  . dicyclomine  20 mg Oral TID AC & HS  . digoxin  0.25 mg Oral Daily  . furosemide  40 mg Oral BID  . insulin aspart  0-5 Units Subcutaneous QHS  . insulin aspart  0-9 Units Subcutaneous TID WC  . losartan  25 mg Oral BID  . nicotine  21 mg Transdermal Daily  . polyethylene glycol  17 g Oral Daily  . potassium chloride  20 mEq Oral Daily  . spironolactone  25 mg Oral Daily   Continuous Infusions:   PRN Meds:.acetaminophen, ipratropium, ondansetron (ZOFRAN) IV, sodium chloride flush Assessment/Plan:  Reginald Hahn is a 65 year old man with tobacco use hospitalized for atrial fibrillation with RVR found to have heart failure with reduced ejection fraction complicated by hemorrhagic shock secondary to large retroperitoneal bleed and persistent abdominal pain.  Generalized abdominal pain: Suspect hematoma as the cause of his complaints given that it did displace his IVC and kidney on initial CT imaging.  -Discontinue dicyclomine and switch MiraLax to as needed -Continue ambulation as  tolerated -Wean oxygen if tolerable -Ordered incentive spirometer -Continue Tylenol 1000mg  every 6 hours as needed to see if it helps  Hemorrhagic shock secondary to large retroperitoneal hematoma: Resolved.  Hematoma stable per most recent abdominal CT findings 6/15. Hemoglobin stable at 11 this morning now for 48 hours. BP 108-123/70s over the last 24 hours. -Holding aspirin and heparin for now -Recheck CBC tomorrow  Heart failure with reduced ejection fraction complicated by acute kidney injury and ischemic hepatitis: Likely mediated by arrhythmia though he does have risk factors for ischemic disease as evident by atherosclerosis of the thoracic aorta on CT imaging. Echo 09/16/15 notable for EF 15%, severe diffuse hypokinesis, mid-moderate tricuspid regurg, severe RA dilatation, mild LA dilatation. Repeat echo 09/27/15 with EF 25%, diffuse hypokinesis, mild LA/RA dilatation, mild LVH. Cardiac cath deferred for now though plan for Myoview to assess for ischemic disease tomorrow. -Continue Lasix 40 mg twice daily -Continue digoxin 0.25mg  daily -Continue spironolactone 12.5mg  daily -Continue losartan 25mg  twice daily -Follow-up Myoview results  New onset atrial fibrillation: Suspicion still remains for viral myocarditis given cardiomegaly on initial chest x-ray in the absence of other causes by workup so far. Converted to sinus rhythm yesterday though back in atrial fibrillation with heart rate trending 40s-100s over the last 24 hours.  -Continue telemetry -Continue amiodarone 400mg  twice daily  -Holding heparin IV as noted above  Hyponatremia: Suspect initially hypotonic hypervolemic hyponatremia. Urine osmolality over 100 which is consistent with ADH secretion though urine sodium undetectable which is consistent with retention in the setting of decreased effective arterial volume. Sodium 131, improved from 128 on admission. -Lasix as noted above  #FEN:  -Diet: Heart healthy  #DVT  prophylaxis: SCDs  #CODE STATUS: FULL CODE -Confirmed with patient on admission  Dispo: Disposition is deferred at this time, awaiting improvement of current medical problems.  .   The patient does not have a current PCP (No primary care provider on file.) and does need an [facility] hospital follow-up appointment after discharge.  The patient does not have transportation limitations that hinder transportation to clinic appointments.  .Services Needed at time of discharge: Y = Yes, Blank = No PT:   OT:   RN:   Equipment:   Other:     LOS: 12 days   Beather Arbour, MD 09/27/2015, 8:28 AM

## 2015-09-27 NOTE — Progress Notes (Signed)
Physical Therapy Treatment Patient Details Name: Reginald Hahn MRN: [id number] DOB: February 25, 1951 Today's Date: 09/27/2015    History of Present Illness [name] is a 65 year old man with tobacco use hospitalized for atrial fibrillation with RVR found to have heart failure with reduced ejection fraction complicated by hemorrhagic shock secondary to large retroperitoneal bleed now with mild, generalized abdominal pain.     PT Comments    Patient is progressing well toward mobility goals with increased activity tolerance this session. Current plan remains appropriate.   Follow Up Recommendations  No PT follow up;Supervision for mobility/OOB     Equipment Recommendations  None recommended by PT    Recommendations for Other Services OT consult     Precautions / Restrictions Precautions Precautions: Other (comment) Precaution Comments: check BP, monitor O2 Restrictions Weight Bearing Restrictions: No    Mobility  Bed Mobility Overal bed mobility: Needs Assistance Bed Mobility: Supine to Sit     Supine to sit: Supervision;HOB elevated     General bed mobility comments: supervision for safety  Transfers Overall transfer level: Needs assistance Equipment used: Rolling walker (2 wheeled) Transfers: Sit to/from Stand Sit to Stand: Supervision         General transfer comment: Supervision and RW for safety.  BP stable from supine, sit, standing.    Ambulation/Gait Ambulation/Gait assistance: Supervision Ambulation Distance (Feet): 200 Feet Assistive device: Rolling walker (2 wheeled) Gait Pattern/deviations: Step-through pattern;Decreased stride length     General Gait Details: slow, steady gait on 1L O2 first 100 ft and RA second 169ft after stair training with SpO2 remaining at 96% with supplemental O2 and dropped to upper 80s on RA; cues for pursed lip breathing; quickly returned to 95% on RA end of session    Stairs Stairs: Yes Stairs assistance: Min  guard Stair Management: One rail Right;Forwards Number of Stairs: 4 General stair comments: educated on energy conservation technique and sequencing  Wheelchair Mobility    Modified Rankin (Stroke Patients Only)       Balance     Sitting balance-Leahy Scale: Good       Standing balance-Leahy Scale: Fair                      Cognition Arousal/Alertness: Awake/alert Behavior During Therapy: WFL for tasks assessed/performed Overall Cognitive Status: Within Functional Limits for tasks assessed                      Exercises Other Exercises Other Exercises: Encouraged pt to ambulate w/ nursing staff 3x/day    General Comments General comments (skin integrity, edema, etc.): supplemental O2 off end of session due to SpO2 95% on RA--RN aware      Pertinent Vitals/Pain Pain Assessment: No/denies pain (numbness in R thigh)    Home Living                      Prior Function            PT Goals (current goals can now be found in the care plan section) Acute Rehab PT Goals Patient Stated Goal: to go home PT Goal Formulation: With patient/family Time For Goal Achievement: 10/10/15 Potential to Achieve Goals: Good Progress towards PT goals: Progressing toward goals    Frequency  Min 3X/week    PT Plan Current plan remains appropriate    Co-evaluation             End of Session Equipment Utilized During Treatment:  Gait belt;Oxygen Activity Tolerance: Patient tolerated treatment well Patient left: with call bell/phone within reach;with family/visitor present;in bed     Time: 1550-1619 PT Time Calculation (min) (ACUTE ONLY): 29 min  Charges:  $Gait Training: 23-37 mins                    G Codes:      Derek Mound, PTA Pager: [phone]   09/27/2015, 5:32 PM

## 2015-09-27 NOTE — Progress Notes (Signed)
Pre stress test VS  

## 2015-09-28 LAB — CARBOXYHEMOGLOBIN
CARBOXYHEMOGLOBIN: 2.4 % — AB (ref 0.5–1.5)
Methemoglobin: 0.9 % (ref 0.0–1.5)
O2 Saturation: 83.1 %
Total hemoglobin: 11.7 g/dL — ABNORMAL LOW (ref 13.5–18.0)

## 2015-09-28 LAB — CBC WITH DIFFERENTIAL/PLATELET
BASOS ABS: 0 10*3/uL (ref 0.0–0.1)
Basophils Relative: 0 %
EOS PCT: 3 %
Eosinophils Absolute: 0.4 10*3/uL (ref 0.0–0.7)
HEMATOCRIT: 35.1 % — AB (ref 39.0–52.0)
Hemoglobin: 11.3 g/dL — ABNORMAL LOW (ref 13.0–17.0)
LYMPHS ABS: 1.2 10*3/uL (ref 0.7–4.0)
LYMPHS PCT: 8 %
MCH: 28.5 pg (ref 26.0–34.0)
MCHC: 32.2 g/dL (ref 30.0–36.0)
MCV: 88.4 fL (ref 78.0–100.0)
MONO ABS: 1.5 10*3/uL — AB (ref 0.1–1.0)
MONOS PCT: 10 %
NEUTROS ABS: 12.3 10*3/uL — AB (ref 1.7–7.7)
Neutrophils Relative %: 79 %
PLATELETS: 272 10*3/uL (ref 150–400)
RBC: 3.97 MIL/uL — ABNORMAL LOW (ref 4.22–5.81)
RDW: 16 % — AB (ref 11.5–15.5)
WBC: 15.5 10*3/uL — ABNORMAL HIGH (ref 4.0–10.5)

## 2015-09-28 LAB — GLUCOSE, CAPILLARY
Glucose-Capillary: 88 mg/dL (ref 65–99)
Glucose-Capillary: 83 mg/dL (ref 65–99)

## 2015-09-28 LAB — BASIC METABOLIC PANEL
ANION GAP: 9 (ref 5–15)
BUN: 13 mg/dL (ref 6–20)
CALCIUM: 8.7 mg/dL — AB (ref 8.9–10.3)
CHLORIDE: 95 mmol/L — AB (ref 101–111)
CO2: 28 mmol/L (ref 22–32)
CREATININE: 0.67 mg/dL (ref 0.61–1.24)
GFR calc non Af Amer: >60 mL/min (ref >60)
Glucose, Bld: 83 mg/dL (ref 65–99)
Potassium: 3.3 mmol/L — ABNORMAL LOW (ref 3.5–5.1)
SODIUM: 132 mmol/L — AB (ref 135–145)

## 2015-09-28 LAB — MAGNESIUM: Magnesium: 1.9 mg/dL (ref 1.7–2.4)

## 2015-09-28 MED ORDER — SPIRONOLACTONE 25 MG PO TABS: 25.0000 mg | ORAL_TABLET | Freq: Every day | ORAL | Status: DC

## 2015-09-28 MED ORDER — AMIODARONE HCL 400 MG PO TABS: 400.0000 mg | ORAL_TABLET | Freq: Two times a day (BID) | ORAL | Status: DC

## 2015-09-28 MED ORDER — NICOTINE 21 MG/24HR TD PT24: 21.0000 mg | MEDICATED_PATCH | Freq: Every day | TRANSDERMAL | Status: DC

## 2015-09-28 MED ORDER — DIGOXIN 250 MCG PO TABS: 0.2500 mg | ORAL_TABLET | Freq: Every day | ORAL | Status: DC

## 2015-09-28 MED ORDER — LOSARTAN POTASSIUM 25 MG PO TABS: 25.0000 mg | ORAL_TABLET | Freq: Two times a day (BID) | ORAL | Status: DC

## 2015-09-28 MED ORDER — AMIODARONE HCL 200 MG PO TABS: 200.0000 mg | ORAL_TABLET | Freq: Two times a day (BID) | ORAL | Status: DC

## 2015-09-28 MED ORDER — FUROSEMIDE 40 MG PO TABS: 40.0000 mg | ORAL_TABLET | Freq: Every day | ORAL | Status: DC

## 2015-09-28 MED ORDER — POTASSIUM CHLORIDE CRYS ER 20 MEQ PO TBCR: 40.0000 meq | EXTENDED_RELEASE_TABLET | Freq: Every day | ORAL | Status: DC

## 2015-09-28 MED ORDER — POTASSIUM CHLORIDE CRYS ER 20 MEQ PO TBCR: 20.0000 meq | EXTENDED_RELEASE_TABLET | Freq: Every day | ORAL | Status: DC

## 2015-09-28 MED ORDER — POTASSIUM CHLORIDE CRYS ER 20 MEQ PO TBCR
40.0000 meq | EXTENDED_RELEASE_TABLET | Freq: Once | ORAL | Status: DC
  Filled 2015-09-28: qty 2

## 2015-09-28 MED ORDER — AMIODARONE HCL 400 MG PO TABS: 200.0000 mg | ORAL_TABLET | Freq: Two times a day (BID) | ORAL | Status: DC

## 2015-09-28 MED FILL — FUROSEMIDE 40 MG TABLET: 40 | 30 days supply | Qty: 30 | Fill #0

## 2015-09-28 MED FILL — LOSARTAN POTASSIUM 25 MG TA: 25 | 30 days supply | Qty: 60 | Fill #0

## 2015-09-28 MED FILL — AMIODARONE HCL 200 MG TAB: 200 | 30 days supply | Qty: 60 | Fill #0

## 2015-09-28 MED FILL — ?DIGITEK 250 MCG TABLET: 250 | 30 days supply | Qty: 30 | Fill #0

## 2015-09-28 MED FILL — POTASSIUM CL ER 20 MEQ TAB: 20 | 60 days supply | Qty: 60 | Fill #0

## 2015-09-28 MED FILL — ?SPIRONOLACTONE 25 MG TABLE: 25 | 30 days supply | Qty: 30 | Fill #0

## 2015-09-28 NOTE — Progress Notes (Signed)
Pt has orders to be discharged. Discharge instructions given and pt has no additional questions at this time. Medication regimen reviewed and pt educated. Pt verbalized understanding and has no additional questions. Telemetry box removed. IV removed and site in good condition. Pt stable and waiting for transportation. 

## 2015-09-28 NOTE — Progress Notes (Signed)
Advanced Heart Failure Rounding Note   Subjective:    Feels much better. Still with mild ab pain. Hgb 11.3  Objective:   Weight Range:  Vital Signs:   Temp:  [98 F (36.7 C)-98.4 F (36.9 C)] 98.2 F (36.8 C) (06/20 1154) Pulse Rate:  [76-93] 93 (06/20 1154) Resp:  [18] 18 (06/20 1154) BP: (103-117)/(60-89) 117/82 mmHg (06/20 1154) SpO2:  [94 %-98 %] 94 % (06/20 1154) Weight:  [172 lb 4.8 oz (78.155 kg)] 172 lb 4.8 oz (78.155 kg) (06/20 0542) Last BM Date: 09/27/15  Weight change: Filed Weights   09/26/15 1555 09/27/15 0504 09/28/15 0542  Weight: 177 lb 7.5 oz (80.5 kg) 173 lb 8 oz (78.699 kg) 172 lb 4.8 oz (78.155 kg)    Intake/Output:   Intake/Output Summary (Last 24 hours) at 09/28/15 1505 Last data filed at 09/28/15 0912  Gross per 24 hour  Intake    840 ml  Output    476 ml  Net    364 ml     Physical Exam: CVP 7 General:  Sitting in bed NAD HEENT: normal Neck: supple. JVP 7  Carotids 2+ bilat; no bruits. No thyromegaly or lymphadenopathy noted.  Cor: PMI laterally displaced.  IRR, IRR + s3 Lungs: CTS Abdomen: +minimally tender, + distended. No hepatosplenomegaly. No bruits or masses. Good bowel sounds. Extremities: no cyanosis, clubbing, rash. No peripheral edema  Neuro: alert & orientedx3, cranial nerves grossly intact. moves all 4 extremities w/o difficulty. Affect pleasant  Telemetry: AF 80-90s  Labs: Basic Metabolic Panel:  Recent Labs Lab 09/21/15 1924  09/22/15 0315 09/22/15 0620  09/23/15 0545 09/23/15 1700 09/24/15 0545 09/25/15 0400 09/27/15 0425 09/28/15 0500  NA 125*  < > 126*  --   < > 129* 127* 128* 128* 131* 132*  K 5.1  < > 5.2*  --   < > 3.5 3.1* 3.8 3.7 3.4* 3.3*  CL 90*  < > 96*  --   < > 92* 93* 92* 88* 93* 95*  CO2 26  --  22  --   < > GLUCOSE 136*  < > 222*  --   < > 169* 106* 128* 111* 89 83  BUN 28*  < > 31*  --   < > 25* 22* CREATININE 1.68*  < > 1.39*  --   < > 1.02 0.85 0.83  0.78 0.73 0.67  CALCIUM 8.4*  --  7.5*  --   < > 8.1* 7.8* 8.2* 8.2* 8.7* 8.7*  MG 2.0  --   --  1.9  --  2.2  --  2.0 2.1  --  1.9  PHOS 4.8*  --  4.6  --   --  3.2  --   --   --   --   --   < > = values in this interval not displayed.  Liver Function Tests:  Recent Labs Lab 09/21/15 1924 09/22/15 0315 09/22/15 0620 09/25/15 0400  AST 63*  --  63* 30  ALT 198*  --  149* 75*  ALKPHOS 87  --  75 97  BILITOT 1.0  --  1.8* 1.7*  PROT 5.0*  --  5.1* 5.3*  ALBUMIN 3.1* 2.9* 2.9* 2.7*   No results for input(s): LIPASE, AMYLASE in the last 168 hours. No results for input(s): AMMONIA in the last 168 hours.  CBC:  Recent Labs Lab 09/24/15 0545  09/25/15 0400  09/26/15 0050 09/26/15 0643 09/27/15 0425 09/27/15 1138 09/28/15 0500  WBC 16.8*  --  13.3*  --   --  15.5* 16.3* 17.5* 15.5*  NEUTROABS 14.9*  --  10.9*  --   --  12.7* 13.2*  --  12.3*  HGB 9.8*  < > 9.6*  < > 10.1* 11.0* 10.9* 11.7* 11.3*  HCT 30.1*  < > 29.8*  < > 31.4* 34.2* 34.5* 36.6* 35.1*  MCV 85.0  --  86.4  --   --  86.4 87.6 88.4 88.4  PLT 170  --  177  --   --  257 282 290 272  < > = values in this interval not displayed.  Cardiac Enzymes: No results for input(s): CKTOTAL, CKMB, CKMBINDEX, TROPONINI in the last 168 hours.  BNP: BNP (last 3 results)  Recent Labs  09/15/15 1109  BNP 919.3*    ProBNP (last 3 results) No results for input(s): PROBNP in the last 8760 hours.    Other results:  Imaging: Ct Abdomen Pelvis Wo Contrast  09/27/2015  CLINICAL DATA:  65 year old male with a history of retroperitoneal hemorrhage EXAM: CT ABDOMEN AND PELVIS WITHOUT CONTRAST TECHNIQUE: Multidetector CT imaging of the abdomen and pelvis was performed following the standard protocol without IV contrast. COMPARISON:  CT 09/23/2015, 09/21/2015, 09/16/2015 FINDINGS: Lower chest: Unremarkable appearance of the soft tissues of the chest wall. Heart size within normal limits.  No pericardial fluid/thickening.  Differential attenuation of the blood pool and myocardium, suggesting anemia. No lower mediastinal adenopathy. Unremarkable appearance of the distal esophagus. No hiatal hernia. Nodular opacities of the right greater than left bases. Small right and trace left pleural effusions. Abdomen/pelvis: Small hypodense focus of the right liver lobe, segment 6 measuring 10 mm, incompletely characterized though most likely a benign biliary cysts. Second focus at the liver dome is smaller measuring 5 mm. Again this lesion is incompletely characterized the most likely a benign cyst. Each of these were present on the comparison CT. Unremarkable appearance of spleen. Unremarkable appearance of bilateral adrenal glands. Unremarkable appearance of gallbladder and pancreas. No abnormally dilated small bowel or colon. No transition point. No inflammatory changes of the mesenteries. Loop of small bowel within left inguinal hernia. No evidence of obstruction. Right Kidney/Ureter: Right kidney is uplifted by retroperitoneal hemorrhage, unchanged from prior. No hydronephrosis. No nephrolithiasis. Left Kidney/Ureter: No hydronephrosis. No nephrolithiasis. No perinephric stranding. Unremarkable course of the left ureter. Unremarkable appearance of the urinary bladder. Re- demonstration of retroperitoneal hemorrhage, with the largest component measuring 15 cm in greatest transverse diameter. This measures smaller than the comparison study of 16.2 cm. Hemorrhage appears no larger with no greater extension on the current. There is hemorrhage both anterior posterior to the right psoas muscle, with hemorrhage tracking into the iliacus muscle and along the oblique musculature of the right abdominal wall. Expansion of the right psoas compared to the left. Hemorrhage extends towards the right inguinal region and into the lateral aspect of the right pelvis. No aneurysm. Atherosclerotic calcifications present of the aorta and bilateral iliofemoral  system. Musculoskeletal: No displaced fracture identified. Degenerative changes of the visualized spine. No significant bony canal narrowing. Degenerative changes of the bilateral hips. IMPRESSION: Noncontrast CT again demonstrates retroperitoneal hemorrhage, with no evidence of enlarging or ongoing hemorrhage. The distribution of hemorrhage anterior and posterior to the right psoas muscle, as well as the asymmetric expansion of the right psoas compared to the left suggests hemorrhage originated within the psoas muscle. Short-segment  of small bowel within the left inguinal hernia without evidence of obstruction. This was present on the comparison CT studies and is unchanged. Scarring/atelectasis at the bilateral lung bases with a small right and trace left pleural effusion. Atherosclerosis. Signed, Yvone Neu. Loreta Ave, DO [facility] Electronically Signed   By: Gilmer Mor D.O.   On: 09/27/2015 13:11   Nm Myocar Multi W/spect W/wall Motion / Ef  09/27/2015   There was no ST segment deviation noted during stress.  The left ventricular ejection fraction is moderately decreased (30-44%).  Findings consistent with prior myocardial infarction.  This is an intermediate risk study.  nferior/inferolateral defect (moderate) that does not improve in the rest images consistent with scar and possible soft tissue attenuation No significant ischemia.      Medications:     Scheduled Medications: . amiodarone  400 mg Oral BID  . digoxin  0.25 mg Oral Daily  . furosemide  40 mg Oral Daily  . insulin aspart  0-5 Units Subcutaneous QHS  . insulin aspart  0-9 Units Subcutaneous TID WC  . losartan  25 mg Oral BID  . nicotine  21 mg Transdermal Daily  . potassium chloride  20 mEq Oral Daily  . potassium chloride  40 mEq Oral Once  . spironolactone  25 mg Oral Daily    Infusions:    PRN Medications: acetaminophen, ipratropium, ondansetron (ZOFRAN) IV,  polyethylene glycol, sodium chloride flush   Assessment:   1. New onset atrial fibrillation with rapid ventricular response:  This patients CHA2DS2-VASc Score and unadjusted Ischemic Stroke Rate (% per year) is equal to 2.2 % stroke rate/year from a score of 2  Above score calculated as 1 point each if present [CHF, HTN, DM, Vascular=MI/PAD/Aortic Plaque, Age if 65-74, or Male] Above score calculated as 2 points each if present [Age > 75, or Stroke/TIA/TE] 2. Acute combined systolic and diastolic CHF:   --EF 15% 3. Tobacco abuse:  4. AKI - improving 5. Transaminitis 6. Hypokalemia/hyponatremia 7. Retroperitoneal Bleed- 6/13 8. Acute Blood Loss--->RTP    Plan/Discussion:    Looks good. Myoview likely diaphragmatic attenuation not infarct.   D/c meds  Amio 200 bid Digoxin 0.25 daily Lasix 40 daily Losartan 25 bid Spiro 25 daily Kcl 20 daily  Can add b-blocker as outpatient   Bensimhon, Daniel,MD 3:05 PM

## 2015-09-28 NOTE — Progress Notes (Signed)
Patient's oxygen saturation on room air at rest is 98%. While ambulating, patient's oxygen saturation dropped to 84% but went back into the 90's quickly several times. Patient admitted that he was taking shallow, short breaths and has to learn to start taking long, deep breaths again. Ambulated approximately 50 feet. Will ambulate patient again today.

## 2015-09-28 NOTE — Progress Notes (Signed)
Subjective: NAEON. Required Tylenol for pain 1x ON. Ambulated yesterday. Had BM this morning. Tolerating diet.   Stress test showed no areas of ischemia. Repeat CBC and CT scan for abdominal pain showed stable Hgb and stable RPH.  Objective: Vital signs in last 24 hours: Filed Vitals:   09/27/15 1431 09/27/15 2010 09/27/15 2352 09/28/15 0542  BP:  103/60 116/89 106/74  Pulse:  86 79 76  Temp:  98.4 F (36.9 C) 98 F (36.7 C) 98.1 F (36.7 C)  TempSrc:  Oral Oral Oral  Resp:  18 18 18   Height:      Weight:    78.155 kg (172 lb 4.8 oz)  SpO2: 87% 94% 96% 98%   Weight change: -2.345 kg (-5 lb 2.7 oz)  Intake/Output Summary (Last 24 hours) at 09/28/15 1053 Last data filed at 09/28/15 0912  Gross per 24 hour  Intake   1080 ml  Output    627 ml  Net    453 ml   BP 106/74 mmHg  Pulse 76  Temp(Src) 98.1 F (36.7 C) (Oral)  Resp 18  Ht 6' (1.829 m)  Wt 78.155 kg (172 lb 4.8 oz)  BMI 23.36 kg/m2  SpO2 98% Lab Results:  Recent Labs Lab 09/27/15 0425 09/27/15 1138 09/28/15 0500  WBC 16.3* 17.5* 15.5*  HGB 10.9* 11.7* 11.3*  HCT 34.5* 36.6* 35.1*  PLT 282 290 272    Recent Labs Lab 09/21/15 1924  09/22/15 0620  09/25/15 0400 09/27/15 0425 09/28/15 0500  NA 125*  < >  --   < > 128* 131* 132*  K 5.1  < >  --   < > 3.7 3.4* 3.3*  CL 90*  < >  --   < > 88* 93* 95*  CO2 26  < >  --   < > 31 30 28   BUN 28*  < >  --   < > 15 13 13   CREATININE 1.68*  < >  --   < > 0.78 0.73 0.67  CALCIUM 8.4*  < >  --   < > 8.2* 8.7* 8.7*  PROT 5.0*  --  5.1*  --  5.3*  --   --   BILITOT 1.0  --  1.8*  --  1.7*  --   --   ALKPHOS 87  --  75  --  97  --   --   ALT 198*  --  149*  --  75*  --   --   AST 63*  --  63*  --  30  --   --   GLUCOSE 136*  < >  --   < > 111* 89 83  < > = values in this interval not displayed. Micro Results: No results found for this or any previous visit (from the past 240 hour(s)). Studies/Results: Ct Abdomen Pelvis Wo Contrast  09/27/2015  CLINICAL  DATA:  65 year old male with a history of retroperitoneal hemorrhage EXAM: CT ABDOMEN AND PELVIS WITHOUT CONTRAST TECHNIQUE: Multidetector CT imaging of the abdomen and pelvis was performed following the standard protocol without IV contrast. COMPARISON:  CT 09/23/2015, 09/21/2015, 09/16/2015 FINDINGS: Lower chest: Unremarkable appearance of the soft tissues of the chest wall. Heart size within normal limits.  No pericardial fluid/thickening. Differential attenuation of the blood pool and myocardium, suggesting anemia. No lower mediastinal adenopathy. Unremarkable appearance of the distal esophagus. No hiatal hernia. Nodular opacities of the right greater than left bases. Small right and trace  left pleural effusions. Abdomen/pelvis: Small hypodense focus of the right liver lobe, segment 6 measuring 10 mm, incompletely characterized though most likely a benign biliary cysts. Second focus at the liver dome is smaller measuring 5 mm. Again this lesion is incompletely characterized the most likely a benign cyst. Each of these were present on the comparison CT. Unremarkable appearance of spleen. Unremarkable appearance of bilateral adrenal glands. Unremarkable appearance of gallbladder and pancreas. No abnormally dilated small bowel or colon. No transition point. No inflammatory changes of the mesenteries. Loop of small bowel within left inguinal hernia. No evidence of obstruction. Right Kidney/Ureter: Right kidney is uplifted by retroperitoneal hemorrhage, unchanged from prior. No hydronephrosis. No nephrolithiasis. Left Kidney/Ureter: No hydronephrosis. No nephrolithiasis. No perinephric stranding. Unremarkable course of the left ureter. Unremarkable appearance of the urinary bladder. Re- demonstration of retroperitoneal hemorrhage, with the largest component measuring 15 cm in greatest transverse diameter. This measures smaller than the comparison study of 16.2 cm. Hemorrhage appears no larger with no greater  extension on the current. There is hemorrhage both anterior posterior to the right psoas muscle, with hemorrhage tracking into the iliacus muscle and along the oblique musculature of the right abdominal wall. Expansion of the right psoas compared to the left. Hemorrhage extends towards the right inguinal region and into the lateral aspect of the right pelvis. No aneurysm. Atherosclerotic calcifications present of the aorta and bilateral iliofemoral system. Musculoskeletal: No displaced fracture identified. Degenerative changes of the visualized spine. No significant bony canal narrowing. Degenerative changes of the bilateral hips. IMPRESSION: Noncontrast CT again demonstrates retroperitoneal hemorrhage, with no evidence of enlarging or ongoing hemorrhage. The distribution of hemorrhage anterior and posterior to the right psoas muscle, as well as the asymmetric expansion of the right psoas compared to the left suggests hemorrhage originated within the psoas muscle. Short-segment of small bowel within the left inguinal hernia without evidence of obstruction. This was present on the comparison CT studies and is unchanged. Scarring/atelectasis at the bilateral lung bases with a small right and trace left pleural effusion. Atherosclerosis. Signed, Yvone Neu. Loreta Ave, DO [facility] Electronically Signed   By: Gilmer Mor D.O.   On: 09/27/2015 13:11   Nm Myocar Multi W/spect W/wall Motion / Ef  09/27/2015   There was no ST segment deviation noted during stress.  The left ventricular ejection fraction is moderately decreased (30-44%).  Findings consistent with prior myocardial infarction.  This is an intermediate risk study.  nferior/inferolateral defect (moderate) that does not improve in the rest images consistent with scar and possible soft tissue attenuation No significant ischemia.    Medications: I have reviewed the patient's current  medications. Scheduled Meds: . amiodarone  400 mg Oral BID  . digoxin  0.25 mg Oral Daily  . furosemide  40 mg Oral Daily  . insulin aspart  0-5 Units Subcutaneous QHS  . insulin aspart  0-9 Units Subcutaneous TID WC  . losartan  25 mg Oral BID  . nicotine  21 mg Transdermal Daily  . potassium chloride  20 mEq Oral Daily  . spironolactone  25 mg Oral Daily   Continuous Infusions:  PRN Meds:.acetaminophen, ipratropium, ondansetron (ZOFRAN) IV, polyethylene glycol, sodium chloride flush Assessment/Plan: Principal Problem:   Acute combined systolic and diastolic heart failure (HCC) Active Problems:   Atrial fibrillation with rapid ventricular response (HCC)   Tobacco abuse   Transaminitis   AKI (acute kidney injury) (HCC)   Atrial fibrillation (HCC)   Cardiogenic shock (  HCC)   Retroperitoneal bleed   Hemorrhagic shock  Mr. [name] is a 57yoM with a history of tobacco use and p/w atrial fibrillation with RVR on 6/7 and CHF with EF 15%. Care was advanced to rhythm control, diuresis, and inotropic/vasodilator therapy. Course c/b hemorrhagic shock 2/2 RPH on 09/21/15 and anticoagulation was stopped. In the ICU, he required Vasopressin 6/13-6/14, fluid resuscitation, 3u pRBC, and 4u FFP. No evidence of further bleeding and care has been de-escalated to floor status w/ tele. EF 25% on repeat echo 6/18.  #Atrial fibrillation, CHF (EF 25%) Most likely 2/2 tachycardia-induced from atrial fibrillation in setting of virus. Rate controlled 80s. Pressures stable. Warm and euvolemic. Lasix reduced to qdaily yest. No milrinone x3 days. Nuclear stress test showed no areas of ischemia. No plan for catheterization, anticoagulation therapy, or cardioversion at this time given RPH. CHADSVASC score of 2 (2.2% risk of stroke). Low dig level but digoxin found to have benefit at low levels and be no worse than higher therapeutic levels.  - HF to see ~noon to weigh in on d/c today - Pull PICC prior to d/c -  Rhythm control amiodarone  BID. Inotrope digoxin 0.25mg  qdaily.  - Diuresis - Lasix  qdaily; spironolactone  qdaily - Pressure control - losartan  BID (increase from qdaily yesterday)  - Electrolytes - KCl qdaily (has been getting 20 mEq inpatient and K low) - Discuss weight monitoring and limiting salt and fluids - Needs follow-up with cardiology - BMP at follow-up  # Spontaneous RPH 6/13, normocytic anemia  Likely 2/2 to heparin. Hgb stable at 11 and no evidence of expansion on 6/19 CT. No evidence of abdominal compartment syndrome given nml labs and non-protuberant abdomen. - Acetaminophen  q6h. Had transaminitis during course but this downtrended. If ineffective at f/u, consider ultram. Pt has codeine allergy. - D/c miralax - Recheck CBC at f/u  # Leukocytosis WBC 15.5 today. Trended up during bleed and now down-trending. Has also had intermittent elevated white count during course. Afebrile.   #Hypotonic hyponatremia (improving) Na 132, which is highest Na during course. Attributed to HF given high urine osm, low urine Na, and initial hypervolemia.  - Daily BMP  #Dyspnea, wheezing, cough Dyspnea most likely 2/2 CHF and deconditioning given limiting mobility w/ hypotension and ICU. Avoid duonebs given tachycardia.Some desats while walking to 80s but then comes back to 90s with continued walking. RA at rest. - Ambulation test to see if need for home O2  - Lots of walking, OOB, and incentive spirometer - Ipratropium nebs q8h prn SOB and guaifenesin q4hr prn for cough - Consider outpatient PFTs for COPD and starting amiodarone  # Tobacco abuse x30 years. 1 ppd  - Patch  while inpatient.  #VTE Prophylaxis - None given RPH  #FEN/GI - No IVF  - K 3.3 today. See above. - Daily CBC, BMP, and Mg - Heart healthy diet - SSI given hyperglycemia in ICU but BGs now well-controlled on diet  #Dispo - 1x follow-up with [facility] on 6/22. Going out of  town this weekend (exwife who is watching patient needs to move in [place]) - CM consulted to establish with [facility] and medication assistance as patient is uninsured. - Will need cardiology follow-up - Would like to establish with [facility].  This is a Psychologist, occupational Note.  The care of the patient was discussed with Dr. Boykin Peek and the assessment and plan formulated with their assistance.  Please see their attached note for  official documentation of the daily encounter.   LOS: 13 days   Newton Pigg, Med Student 09/28/2015, 10:53 AM

## 2015-09-28 NOTE — Progress Notes (Addendum)
Patient ambulated in hallway with walker for approximately 100 ft. Oxygen saturations maintained between 94-100% on room air while ambulating. No complications. Will continue to monitor.

## 2015-09-28 NOTE — Care Management Note (Addendum)
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 No PCP or insurance. Patient is agreeable to go to the [facility] for follow up care; They currently do not have any availability at this time and request that the pt call them tomorrow for an apt. He can get his prescriptions filled there also at discharge. CM talked to patient also with his ex wife present about [facility] $4 medication list. Per Artist notes - Patient is selfemployed for 35 yrs with [employer] but only working 2-3 hrs a day making about $1800.00 a mnth at this time; Patient does not qualify for the [employer]. CM encouraged pt to talk to social services department to see what he might qualify for.  Expected Discharge Date:     09/28/2015             Expected Discharge Plan:  Home/Self Care  In-House Referral:  Financial Counselor Discharge planning Services  CM Consult, Follow-up appt scheduled  Status of Service:  Completed, signed off  Gwenlyn Fudge 09/28/2015, 12:59 PM

## 2015-09-28 NOTE — Progress Notes (Signed)
Subjective: Feels good this AM, no complaints.  Breathing is significantly better.    Objective: Vital signs in last 24 hours: Filed Vitals:   09/27/15 1431 09/27/15 2010 09/27/15 2352 09/28/15 0542  BP:  103/60 116/89 106/74  Pulse:  86 79 76  Temp:  98.4 F (36.9 C) 98 F (36.7 C) 98.1 F (36.7 C)  TempSrc:  Oral Oral Oral  Resp:  18 18 18   Height:      Weight:    172 lb 4.8 oz (78.155 kg)  SpO2: 87% 94% 96% 98%   Weight change: -5 lb 2.7 oz (-2.345 kg)  Intake/Output Summary (Last 24 hours) at 09/28/15 1123 Last data filed at 09/28/15 0912  Gross per 24 hour  Intake   1080 ml  Output    627 ml  Net    453 ml   General: Sitting in bed in NAD.   HEENT: no scleral icterus, no JVD appreciated Cardiac: Irregular rhythm. Pulm: CTAB Extremities: No LE edema.  Neuro: A&O x3, CN grossly intact, moving all extremities.  Lab Results: Basic Metabolic Panel:  Recent Labs Lab 09/22/15 0315  09/23/15 0545  09/25/15 0400 09/27/15 0425 09/28/15 0500  NA 126*  < > 129*  < > 128* 131* 132*  K 5.2*  < > 3.5  < > 3.7 3.4* 3.3*  CL 96*  < > 92*  < > 88* 93* 95*  CO2 22  < > 27  < > 31 30 28   GLUCOSE 222*  < > 169*  < > 111* 89 83  BUN 31*  < > 25*  < > 15 13 13   CREATININE 1.39*  < > 1.02  < > 0.78 0.73 0.67  CALCIUM 7.5*  < > 8.1*  < > 8.2* 8.7* 8.7*  MG  --   < > 2.2  < > 2.1  --  1.9  PHOS 4.6  --  3.2  --   --   --   --   < > = values in this interval not displayed.  Liver Function Tests:  Recent Labs Lab 09/22/15 0620 09/25/15 0400  AST 63* 30  ALT 149* 75*  ALKPHOS 75 97  BILITOT 1.8* 1.7*  PROT 5.1* 5.3*  ALBUMIN 2.9* 2.7*   CBC:  Recent Labs Lab 09/27/15 0425 09/27/15 1138 09/28/15 0500  WBC 16.3* 17.5* 15.5*  NEUTROABS 13.2*  --  12.3*  HGB 10.9* 11.7* 11.3*  HCT 34.5* 36.6* 35.1*  MCV 87.6 88.4 88.4  PLT 282 290 272    Coagulation:  Recent Labs Lab 09/21/15 2108 09/22/15 0315 09/22/15 1203  LABPROT 19.4* 16.8* 17.6*  INR 1.64*  1.35 1.44   Urine Drug Screen: Drugs of Abuse     Component Value Date/Time   LABOPIA NONE DETECTED 09/15/2015 2004   COCAINSCRNUR NONE DETECTED 09/15/2015 2004   LABBENZ POSITIVE* 09/15/2015 2004   AMPHETMU NONE DETECTED 09/15/2015 2004   THCU POSITIVE* 09/15/2015 2004   LABBARB NONE DETECTED 09/15/2015 2004    Micro Results: No results found for this or any previous visit (from the past 240 hour(s)). Studies/Results: Ct Abdomen Pelvis Wo Contrast  09/27/2015  CLINICAL DATA:  65 year old male with a history of retroperitoneal hemorrhage EXAM: CT ABDOMEN AND PELVIS WITHOUT CONTRAST TECHNIQUE: Multidetector CT imaging of the abdomen and pelvis was performed following the standard protocol without IV contrast. COMPARISON:  CT 09/23/2015, 09/21/2015, 09/16/2015 FINDINGS: Lower chest: Unremarkable appearance of the soft tissues of the chest wall. Heart  size within normal limits.  No pericardial fluid/thickening. Differential attenuation of the blood pool and myocardium, suggesting anemia. No lower mediastinal adenopathy. Unremarkable appearance of the distal esophagus. No hiatal hernia. Nodular opacities of the right greater than left bases. Small right and trace left pleural effusions. Abdomen/pelvis: Small hypodense focus of the right liver lobe, segment 6 measuring 10 mm, incompletely characterized though most likely a benign biliary cysts. Second focus at the liver dome is smaller measuring 5 mm. Again this lesion is incompletely characterized the most likely a benign cyst. Each of these were present on the comparison CT. Unremarkable appearance of spleen. Unremarkable appearance of bilateral adrenal glands. Unremarkable appearance of gallbladder and pancreas. No abnormally dilated small bowel or colon. No transition point. No inflammatory changes of the mesenteries. Loop of small bowel within left inguinal hernia. No evidence of obstruction. Right Kidney/Ureter: Right kidney is uplifted by  retroperitoneal hemorrhage, unchanged from prior. No hydronephrosis. No nephrolithiasis. Left Kidney/Ureter: No hydronephrosis. No nephrolithiasis. No perinephric stranding. Unremarkable course of the left ureter. Unremarkable appearance of the urinary bladder. Re- demonstration of retroperitoneal hemorrhage, with the largest component measuring 15 cm in greatest transverse diameter. This measures smaller than the comparison study of 16.2 cm. Hemorrhage appears no larger with no greater extension on the current. There is hemorrhage both anterior posterior to the right psoas muscle, with hemorrhage tracking into the iliacus muscle and along the oblique musculature of the right abdominal wall. Expansion of the right psoas compared to the left. Hemorrhage extends towards the right inguinal region and into the lateral aspect of the right pelvis. No aneurysm. Atherosclerotic calcifications present of the aorta and bilateral iliofemoral system. Musculoskeletal: No displaced fracture identified. Degenerative changes of the visualized spine. No significant bony canal narrowing. Degenerative changes of the bilateral hips. IMPRESSION: Noncontrast CT again demonstrates retroperitoneal hemorrhage, with no evidence of enlarging or ongoing hemorrhage. The distribution of hemorrhage anterior and posterior to the right psoas muscle, as well as the asymmetric expansion of the right psoas compared to the left suggests hemorrhage originated within the psoas muscle. Short-segment of small bowel within the left inguinal hernia without evidence of obstruction. This was present on the comparison CT studies and is unchanged. Scarring/atelectasis at the bilateral lung bases with a small right and trace left pleural effusion. Atherosclerosis. Signed, Yvone Neu. Loreta Ave, DO [facility] Radiology Electronically Signed   By: Gilmer Mor D.O.   On: 09/27/2015 13:11   Nm Myocar Multi W/spect  W/wall Motion / Ef  09/27/2015   There was no ST segment deviation noted during stress.  The left ventricular ejection fraction is moderately decreased (30-44%).  Findings consistent with prior myocardial infarction.  This is an intermediate risk study.  nferior/inferolateral defect (moderate) that does not improve in the rest images consistent with scar and possible soft tissue attenuation No significant ischemia.    Medications: I have reviewed the patient's current medications. Scheduled Meds: . amiodarone  400 mg Oral BID  . digoxin  0.25 mg Oral Daily  . furosemide  40 mg Oral Daily  . insulin aspart  0-5 Units Subcutaneous QHS  . insulin aspart  0-9 Units Subcutaneous TID WC  . losartan  25 mg Oral BID  . nicotine  21 mg Transdermal Daily  . potassium chloride  20 mEq Oral Daily  . spironolactone  25 mg Oral Daily   Continuous Infusions:   PRN Meds:.acetaminophen, ipratropium, ondansetron (ZOFRAN) IV, polyethylene glycol, sodium chloride flush Assessment/Plan:  Acute CHF Breathing improved.  Wt down since admission.  Down 7.8L since admission.   -cont current meds -stable for d/c today  -keep f/u appts   Hemorrhagic shock secondary to large retroperitoneal hematoma -Resolved   New onset atrial fibrillation Not on anticoagulation due to retroperitoneal bleed. HR controlled.  -cont amiodarone, digoxin   Dispo: Discharge today.   The patient does not have a current PCP (No primary care provider on file.) and does need an [facility] hospital follow-up appointment after discharge.   LOS: 13 days   Reginald Salvage, MD 09/28/2015, 11:23 AM

## 2015-09-28 NOTE — Progress Notes (Signed)
Physical Therapy Treatment Patient Details Name: Reginald Hahn MRN: [id number] DOB: 03-Dec-1950 Today's Date: 09/28/2015    History of Present Illness Reginald Hahn is a 65 year old man with tobacco use hospitalized for atrial fibrillation with RVR found to have heart failure with reduced ejection fraction complicated by hemorrhagic shock secondary to large retroperitoneal bleed now with mild, generalized abdominal pain.     PT Comments    Progressing well, gave pt Home Ex Program.  Sats acceptable on RA during activity.  Follow Up Recommendations  No PT follow up;Supervision for mobility/OOB     Equipment Recommendations  None recommended by PT    Recommendations for Other Services       Precautions / Restrictions      Mobility  Bed Mobility Overal bed mobility: Modified Independent Bed Mobility: Supine to Sit;Sit to Supine     Supine to sit: Modified independent (Device/Increase time) Sit to supine: Modified independent (Device/Increase time)      Transfers Overall transfer level: Needs assistance   Transfers: Sit to/from Stand;Stand Pivot Transfers Sit to Stand: Supervision Stand pivot transfers: Supervision       General transfer comment: generally steady  Ambulation/Gait Ambulation/Gait assistance: Supervision Ambulation Distance (Feet): 400 Feet (with 3 standing rests)   Gait Pattern/deviations: Step-through pattern   Gait velocity interpretation: at or above normal speed for age/gender General Gait Details: moderate speed, steady and able to accept challenges incl stepping over obstacles, speed changes, turns, steps without LOB   Stairs Stairs: Yes Stairs assistance: Supervision Stair Management: One rail Right;Alternating pattern;Step to pattern Number of Stairs: 6    Wheelchair Mobility    Modified Rankin (Stroke Patients Only)       Balance Overall balance assessment: Needs assistance   Sitting balance-Leahy Scale: Good        Standing balance-Leahy Scale: Good                      Cognition Arousal/Alertness: Awake/alert Behavior During Therapy: WFL for tasks assessed/performed Overall Cognitive Status: Within Functional Limits for tasks assessed                      Exercises General Exercises - Lower Extremity Hip ABduction/ADduction: AROM;Strengthening;Both;10 reps;Standing Hip Flexion/Marching: AROM;Strengthening;Both;10 reps;Standing Toe Raises: AROM;Both;10 reps;Standing Mini-Sqauts: AROM;Strengthening;10 reps;Standing    General Comments General comments (skin integrity, edema, etc.): SpO2 during gait 92-94% with EHR 94 to 102 bpm      Pertinent Vitals/Pain Pain Assessment: Faces Faces Pain Scale: Hurts little more Pain Location: groin R Pain Descriptors / Indicators: Sore Pain Intervention(s): Monitored during session    Home Living                      Prior Function            PT Goals (current goals can now be found in the care plan section) Acute Rehab PT Goals Patient Stated Goal: to go home PT Goal Formulation: With patient/family Time For Goal Achievement: 10/10/15 Potential to Achieve Goals: Good Progress towards PT goals: Progressing toward goals    Frequency  Min 3X/week    PT Plan Current plan remains appropriate    Co-evaluation             End of Session   Activity Tolerance: Patient tolerated treatment well Patient left: with call bell/phone within reach;with family/visitor present;in bed     Time: 1336-1405 PT Time Calculation (min) (ACUTE ONLY): 29 min  Charges:  $  Gait Training: 8-22 mins $Therapeutic Exercise: 8-22 mins                    G Codes:      Reginald Hahn, Reginald Hahn 09/28/2015, 2:16 PM 09/28/2015  Reginald Hahn, PT [phone]  (pager)

## 2015-09-29 ENCOUNTER — Telehealth: Payer: Self-pay | Admitting: *Deleted

## 2015-09-29 NOTE — Telephone Encounter (Signed)
The pharmacist at [facility] on [address] ask for a call back to confirm known possible drug interaction between digoxin and amiodarone, these will not be filled until a physician ok's please call [phone] asap Sent to dr's gill and V. Allena Katz

## 2015-09-29 NOTE — Telephone Encounter (Signed)
Per Dr. Prescott Gum note (cardiologist), Reginald Hahn should be on the following medications: Amio 200 bid Digoxin 0.25 daily Lasix 40 daily Losartan 25 bid Spiro 25 daily Kcl 20 daily  Additionally, he was on these medications while an inpatient at [facility].  Thanks!

## 2015-09-29 NOTE — Telephone Encounter (Signed)
Called [facility] and gave them the info, also cardiology ph#

## 2015-09-30 ENCOUNTER — Ambulatory Visit (HOSPITAL_COMMUNITY)
Admission: RE | Admit: 2015-09-30 | Discharge: 2015-09-30 | Disposition: A | Discharge status: Home / Self Care | Payer: Self-pay | Source: Ambulatory Visit | Attending: Oncology | Admitting: Oncology

## 2015-09-30 ENCOUNTER — Encounter: Payer: Self-pay | Admitting: Internal Medicine

## 2015-09-30 ENCOUNTER — Ambulatory Visit (INDEPENDENT_AMBULATORY_CARE_PROVIDER_SITE_OTHER): Payer: Self-pay | Admitting: Internal Medicine

## 2015-09-30 VITALS — BP 109/67 | HR 76 | Temp 97.6°F | Ht 72.0 in | Wt 173.8 lb

## 2015-09-30 DIAGNOSIS — I4891 Unspecified atrial fibrillation: Secondary | ICD-10-CM

## 2015-09-30 DIAGNOSIS — K661 Hemoperitoneum: Secondary | ICD-10-CM

## 2015-09-30 DIAGNOSIS — R9431 Abnormal electrocardiogram [ECG] [EKG]: Secondary | ICD-10-CM | POA: Insufficient documentation

## 2015-09-30 DIAGNOSIS — R58 Hemorrhage, not elsewhere classified: Secondary | ICD-10-CM

## 2015-09-30 DIAGNOSIS — I5041 Acute combined systolic (congestive) and diastolic (congestive) heart failure: Secondary | ICD-10-CM

## 2015-09-30 NOTE — Patient Instructions (Signed)
1. You are improving well. 2. Continue all of your medications at the prescribed doses. 3. Continue weighing yourself every morning. Call the clinic or Dr. Gala Romney if weight increases 5 lbs. 4. Return to clinic in 1 month.  Heart Failure Heart failure is a condition in which the heart has trouble pumping blood. This means your heart does not pump blood efficiently for your body to work well. In some cases of heart failure, fluid may back up into your lungs or you may have swelling (edema) in your lower legs. Heart failure is usually a long-term (chronic) condition. It is important for you to take good care of yourself and follow your health care provider's treatment plan. CAUSES  Some health conditions can cause heart failure. Those health conditions include:  High blood pressure (hypertension). Hypertension causes the heart muscle to work harder than normal. When pressure in the blood vessels is high, the heart needs to pump (contract) with more force in order to circulate blood throughout the body. High blood pressure eventually causes the heart to become stiff and weak.  Coronary artery disease (CAD). CAD is the buildup of cholesterol and fat (plaque) in the arteries of the heart. The blockage in the arteries deprives the heart muscle of oxygen and blood. This can cause chest pain and may lead to a heart attack. High blood pressure can also contribute to CAD.  Heart attack (myocardial infarction). A heart attack occurs when one or more arteries in the heart become blocked. The loss of oxygen damages the muscle tissue of the heart. When this happens, part of the heart muscle dies. The injured tissue does not contract as well and weakens the heart's ability to pump blood.  Abnormal heart valves. When the heart valves do not open and close properly, it can cause heart failure. This makes the heart muscle pump harder to keep the blood flowing.  Heart muscle disease (cardiomyopathy or myocarditis).  Heart muscle disease is damage to the heart muscle from a variety of causes. These can include drug or alcohol abuse, infections, or unknown reasons. These can increase the risk of heart failure.  Lung disease. Lung disease makes the heart work harder because the lungs do not work properly. This can cause a strain on the heart, leading it to fail.  Diabetes. Diabetes increases the risk of heart failure. High blood sugar contributes to high fat (lipid) levels in the blood. Diabetes can also cause slow damage to tiny blood vessels that carry important nutrients to the heart muscle. When the heart does not get enough oxygen and food, it can cause the heart to become weak and stiff. This leads to a heart that does not contract efficiently.  Other conditions can contribute to heart failure. These include abnormal heart rhythms, thyroid problems, and low blood counts (anemia). Certain unhealthy behaviors can increase the risk of heart failure, including:  Being overweight.  Smoking or chewing tobacco.  Eating foods high in fat and cholesterol.  Abusing illicit drugs or alcohol.  Lacking physical activity. SYMPTOMS  Heart failure symptoms may vary and can be hard to detect. Symptoms may include:  Shortness of breath with activity, such as climbing stairs.  Persistent cough.  Swelling of the feet, ankles, legs, or abdomen.  Unexplained weight gain.  Difficulty breathing when lying flat (orthopnea).  Waking from sleep because of the need to sit up and get more air.  Rapid heartbeat.  Fatigue and loss of energy.  Feeling light-headed, dizzy, or close to  fainting.  Loss of appetite.  Nausea.  Increased urination during the night (nocturia). DIAGNOSIS  A diagnosis of heart failure is based on your history, symptoms, physical examination, and diagnostic tests. Diagnostic tests for heart failure may include:  Echocardiography.  Electrocardiography.  Chest X-ray.  Blood  tests.  Exercise stress test.  Cardiac angiography.  Radionuclide scans. TREATMENT  Treatment is aimed at managing the symptoms of heart failure. Medicines, behavioral changes, or surgical intervention may be necessary to treat heart failure.  Medicines to help treat heart failure may include:  Angiotensin-converting enzyme (ACE) inhibitors. This type of medicine blocks the effects of a blood protein called angiotensin-converting enzyme. ACE inhibitors relax (dilate) the blood vessels and help lower blood pressure.  Angiotensin receptor blockers (ARBs). This type of medicine blocks the actions of a blood protein called angiotensin. Angiotensin receptor blockers dilate the blood vessels and help lower blood pressure.  Water pills (diuretics). Diuretics cause the kidneys to remove salt and water from the blood. The extra fluid is removed through urination. This loss of extra fluid lowers the volume of blood the heart pumps.  Beta blockers. These prevent the heart from beating too fast and improve heart muscle strength.  Digitalis. This increases the force of the heartbeat.  Healthy behavior changes include:  Obtaining and maintaining a healthy weight.  Stopping smoking or chewing tobacco.  Eating heart-healthy foods.  Limiting or avoiding alcohol.  Stopping illicit drug use.  Physical activity as directed by your health care provider.  Surgical treatment for heart failure may include:  A procedure to open blocked arteries, repair damaged heart valves, or remove damaged heart muscle tissue.  A pacemaker to improve heart muscle function and control certain abnormal heart rhythms.  An internal cardioverter defibrillator to treat certain serious abnormal heart rhythms.  A left ventricular assist device (LVAD) to assist the pumping ability of the heart. HOME CARE INSTRUCTIONS   Take medicines only as directed by your health care provider. Medicines are important in reducing  the workload of your heart, slowing the progression of heart failure, and improving your symptoms.  Do not stop taking your medicine unless directed by your health care provider.  Do not skip any dose of medicine.  Refill your prescriptions before you run out of medicine. Your medicines are needed every day.  Engage in moderate physical activity if directed by your health care provider. Moderate physical activity can benefit some people. The elderly and people with severe heart failure should consult with a health care provider for physical activity recommendations.  Eat heart-healthy foods. Food choices should be free of trans fat and low in saturated fat, cholesterol, and salt (sodium). Healthy choices include fresh or frozen fruits and vegetables, fish, lean meats, legumes, fat-free or low-fat dairy products, and whole grain or high fiber foods. Talk to a dietitian to learn more about heart-healthy foods.  Limit sodium if directed by your health care provider. Sodium restriction may reduce symptoms of heart failure in some people. Talk to a dietitian to learn more about heart-healthy seasonings.  Use healthy cooking methods. Healthy cooking methods include roasting, grilling, broiling, baking, poaching, steaming, or stir-frying. Talk to a dietitian to learn more about healthy cooking methods.  Limit fluids if directed by your health care provider. Fluid restriction may reduce symptoms of heart failure in some people.  Weigh yourself every day. Daily weights are important in the early recognition of excess fluid. You should weigh yourself every morning after you urinate and before  you eat breakfast. Wear the same amount of clothing each time you weigh yourself. Record your daily weight. Provide your health care provider with your weight record.  Monitor and record your blood pressure if directed by your health care provider.  Check your pulse if directed by your health care provider.  Lose  weight if directed by your health care provider. Weight loss may reduce symptoms of heart failure in some people.  Stop smoking or chewing tobacco. Nicotine makes your heart work harder by causing your blood vessels to constrict. Do not use nicotine gum or patches before talking to your health care provider.  Keep all follow-up visits as directed by your health care provider. This is important.  Limit alcohol intake to no more than 1 drink per day for nonpregnant women and 2 drinks per day for men. One drink equals 12 ounces of beer, 5 ounces of wine, or 1 ounces of hard liquor. Drinking more than that is harmful to your heart. Tell your health care provider if you drink alcohol several times a week. Talk with your health care provider about whether alcohol is safe for you. If your heart has already been damaged by alcohol or you have severe heart failure, drinking alcohol should be stopped completely.  Stop illicit drug use.  Stay up-to-date with immunizations. It is especially important to prevent respiratory infections through current pneumococcal and influenza immunizations.  Manage other health conditions such as hypertension, diabetes, thyroid disease, or abnormal heart rhythms as directed by your health care provider.  Learn to manage stress.  Plan rest periods when fatigued.  Learn strategies to manage high temperatures. If the weather is extremely hot:  Avoid vigorous physical activity.  Use air conditioning or fans or seek a cooler location.  Avoid caffeine and alcohol.  Wear loose-fitting, lightweight, and light-colored clothing.  Learn strategies to manage cold temperatures. If the weather is extremely cold:  Avoid vigorous physical activity.  Layer clothes.  Wear mittens or gloves, a hat, and a scarf when going outside.  Avoid alcohol.  Obtain ongoing education and support as needed.  Participate in or seek rehabilitation as needed to maintain or improve  independence and quality of life. SEEK MEDICAL CARE IF:   You have a rapid weight gain.  You have increasing shortness of breath that is unusual for you.  You are unable to participate in your usual physical activities.  You tire easily.  You cough more than normal, especially with physical activity.  You have any or more swelling in areas such as your hands, feet, ankles, or abdomen.  You are unable to sleep because it is hard to breathe.  You feel like your heart is beating fast (palpitations).  You become dizzy or light-headed upon standing up. SEEK IMMEDIATE MEDICAL CARE IF:   You have difficulty breathing.  There is a change in mental status such as decreased alertness or difficulty with concentration.  You have a pain or discomfort in your chest.  You have an episode of fainting (syncope). MAKE SURE YOU:   Understand these instructions.  Will watch your condition.  Will get help right away if you are not doing well or get worse.   This information is not intended to replace advice given to you by your health care provider. Make sure you discuss any questions you have with your health care provider.   Document Released: 03/27/2005 Document Revised: 08/11/2014 Document Reviewed: 04/26/2012 Elsevier Interactive Patient Education [employer]   Atrial  Fibrillation Atrial fibrillation is a type of irregular or rapid heartbeat (arrhythmia). In atrial fibrillation, the heart quivers continuously in a chaotic pattern. This occurs when parts of the heart receive disorganized signals that make the heart unable to pump blood normally. This can increase the risk for stroke, heart failure, and other heart-related conditions. There are different types of atrial fibrillation, including:  Paroxysmal atrial fibrillation. This type starts suddenly, and it usually stops on its own shortly after it starts.  Persistent atrial fibrillation. This type often lasts longer than a  week. It may stop on its own or with treatment.  Long-lasting persistent atrial fibrillation. This type lasts longer than 12 months.  Permanent atrial fibrillation. This type does not go away. Talk with your health care provider to learn about the type of atrial fibrillation that you have. CAUSES This condition is caused by some heart-related conditions or procedures, including:  A heart attack.  Coronary artery disease.  Heart failure.  Heart valve conditions.  High blood pressure.  Inflammation of the sac that surrounds the heart (pericarditis).  Heart surgery.  Certain heart rhythm disorders, such as Wolf-Parkinson-White syndrome. Other causes include:  Pneumonia.  Obstructive sleep apnea.  Blockage of an artery in the lungs (pulmonary embolism, or PE).  Lung cancer.  Chronic lung disease.  Thyroid problems, especially if the thyroid is overactive (hyperthyroidism).  Caffeine.  Excessive alcohol use or illegal drug use.  Use of some medicines, including certain decongestants and diet pills. Sometimes, the cause cannot be found. RISK FACTORS This condition is more likely to develop in:  People who are older in age.  People who smoke.  People who have diabetes mellitus.  People who are overweight (obese).  Athletes who exercise vigorously. SYMPTOMS Symptoms of this condition include:  A feeling that your heart is beating rapidly or irregularly.  A feeling of discomfort or pain in your chest.  Shortness of breath.  Sudden light-headedness or weakness.  Getting tired easily during exercise. In some cases, there are no symptoms. DIAGNOSIS Your health care provider may be able to detect atrial fibrillation when taking your pulse. If detected, this condition may be diagnosed with:  An electrocardiogram (ECG).  A Holter monitor test that records your heartbeat patterns over a 24-hour period.  Transthoracic echocardiogram (TTE) to evaluate how  blood flows through your heart.  Transesophageal echocardiogram (TEE) to view more detailed images of your heart.  A stress test.  Imaging tests, such as a CT scan or chest X-ray.  Blood tests. TREATMENT The main goals of treatment are to prevent blood clots from forming and to keep your heart beating at a normal rate and rhythm. The type of treatment that you receive depends on many factors, such as your underlying medical conditions and how you feel when you are experiencing atrial fibrillation. This condition may be treated with:  Medicine to slow down the heart rate, bring the heart's rhythm back to normal, or prevent clots from forming.  Electrical cardioversion. This is a procedure that resets your heart's rhythm by delivering a controlled, low-energy shock to the heart through your skin.  Different types of ablation, such as catheter ablation, catheter ablation with pacemaker, or surgical ablation. These procedures destroy the heart tissues that send abnormal signals. When the pacemaker is used, it is placed under your skin to help your heart beat in a regular rhythm. HOME CARE INSTRUCTIONS  Take over-the counter and prescription medicines only as told by your health care provider.  If your health care provider prescribed a blood-thinning medicine (anticoagulant), take it exactly as told. Taking too much blood-thinning medicine can cause bleeding. If you do not take enough blood-thinning medicine, you will not have the protection that you need against stroke and other problems.  Do not use tobacco products, including cigarettes, chewing tobacco, and e-cigarettes. If you need help quitting, ask your health care provider.  If you have obstructive sleep apnea, manage your condition as told by your health care provider.  Do not drink alcohol.  Do not drink beverages that contain caffeine, such as coffee, soda, and tea.  Maintain a healthy weight. Do not use diet pills unless your  health care provider approves. Diet pills may make heart problems worse.  Follow diet instructions as told by your health care provider.  Exercise regularly as told by your health care provider.  Keep all follow-up visits as told by your health care provider. This is important. PREVENTION  Avoid drinking beverages that contain caffeine or alcohol.  Avoid certain medicines, especially medicines that are used for breathing problems.  Avoid certain herbs and herbal medicines, such as those that contain ephedra or ginseng.  Do not use illegal drugs, such as cocaine and amphetamines.  Do not smoke.  Manage your high blood pressure. SEEK MEDICAL CARE IF:  You notice a change in the rate, rhythm, or strength of your heartbeat.  You are taking an anticoagulant and you notice increased bruising.  You tire more easily when you exercise or exert yourself. SEEK IMMEDIATE MEDICAL CARE IF:  You have chest pain, abdominal pain, sweating, or weakness.  You feel nauseous.  You notice blood in your vomit, bowel movement, or urine.  You have shortness of breath.  You suddenly have swollen feet and ankles.  You feel dizzy.  You have sudden weakness or numbness of the face, arm, or leg, especially on one side of the body.  You have trouble speaking, trouble understanding, or both (aphasia).  Your face or your eyelid droops on one side. These symptoms may represent a serious problem that is an emergency. Do not wait to see if the symptoms will go away. Get medical help right away. Call your local emergency services (911 in the [place] Do not drive yourself to the hospital.   This information is not intended to replace advice given to you by your health care provider. Make sure you discuss any questions you have with your health care provider.   Document Released: 03/27/2005 Document Revised: 12/16/2014 Document Reviewed: 07/22/2014 Elsevier Interactive Patient Education [employer].

## 2015-09-30 NOTE — Progress Notes (Signed)
Patient ID: Reginald Hahn, male   DOB: [birth year], 65 y.o.   MRN: [id number]   Subjective:   Patient ID: Reginald Hahn male   DOB: 08-Dec-1950 65 y.o.   MRN: [id number]  HPI: [name] is a 65 y.o. male with PMH as below, here for hospital f/u of new afib with RVR and HFrEF.  Please see Problem-Based charting for the status of the patient's chronic medical issues.  Patient was admitted 6/7 with new onset afib with RVR and EF found to be 15%.  Etiology of the new heart failure was considered to be new afib as well as viral cardiomyopathy.  With rate control, he started to develop cardiogenic shock requiring milrinone drip in addition to amiodarone, digoxin, spironolactone, and losartan.  His course was then complicated by retroperitoneal hemorrhage while on heparin gtt.  Cardiac cath and cardioversion were deferred.     Past Medical History  Diagnosis Date  . Allergy     seasonal allergies  . Arthritis   . Tobacco use     x40 years. 1 ppd  . Acute combined systolic and diastolic heart failure (HCC) 09/16/2015   Current Outpatient Prescriptions  Medication Sig Dispense Refill  . amiodarone (PACERONE) 200 MG tablet Take 1 tablet (200 mg total) by mouth 2 (two) times daily. 60 tablet 0  . digoxin (LANOXIN) 0.25 MG tablet Take 1 tablet (0.25 mg total) by mouth daily. 30 tablet 0  . furosemide (LASIX) 40 MG tablet Take 1 tablet (40 mg total) by mouth daily. 30 tablet 0  . losartan (COZAAR) 25 MG tablet Take 1 tablet (25 mg total) by mouth 2 (two) times daily. 60 tablet 0  . nicotine (NICODERM CQ - DOSED IN MG/24 HOURS) 21 mg/24hr patch Place 1 patch (21 mg total) onto the skin daily. 28 patch 0  . potassium chloride SA (K-DUR,KLOR-CON) 20 MEQ tablet Take 1 tablet (20 mEq total) by mouth daily. 60 tablet 0  . spironolactone (ALDACTONE) 25 MG tablet Take 1 tablet (25 mg total) by mouth daily. 30 tablet 0   No current facility-administered medications for this visit.   Family History    Problem Relation Age of Onset  . Heart disease Mother     died in her 65's.  . Prostate cancer Father     died in his 29's.  . Congenital heart disease Brother     died @ age 33.   Social History   Social History  . Marital Status: Married    Spouse Name: N/A  . Number of Children: N/A  . Years of Education: N/A   Social History Main Topics  . Smoking status: Former Smoker -- 1.00 packs/day for 40 years    Types: Cigarettes  . Smokeless tobacco: None     Comment: Quit since d/c from hospital. Wears nicotene patch.  . Alcohol Use: No  . Drug Use: No     Comment: Used to smoke marijuana  . Sexual Activity: Not Asked   Other Topics Concern  . 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 Objective:  Physical Exam: Filed Vitals:   09/30/15 0836  BP: 109/67  Pulse: 76  Temp: 97.6 F (36.4 C)  TempSrc: Oral  Height: 6' (1.829 m)  Weight: 173 lb 12.8 oz (78.835  kg)  SpO2: 97%   Physical Exam  Constitutional: He is oriented to person, place, and time and well-developed, well-nourished, and in no distress. No distress.  HENT:  Head: Normocephalic and atraumatic.  Eyes: EOM are normal. No scleral icterus.  Neck: No JVD present. No tracheal deviation present.  Cardiovascular: Intact distal pulses.   Irregularly irregular rhythm. Normal rate. Heart sounds distant, but no murmurs appreciated.  Pulmonary/Chest: Effort normal and breath sounds normal. No stridor. No respiratory distress. He has no rales.  No crackles.  Minimal scattered wheezes.  Abdominal: Soft. Bowel sounds are normal. He exhibits no distension. There is no tenderness. There is no rebound and no guarding.  Musculoskeletal: He exhibits no edema.  Neurological: He is alert and oriented to person, place, and time.   Skin: Skin is warm and dry. He is not diaphoretic.     Assessment & Plan:   Patient and case were discussed with Dr. Cyndie Chime.  Please refer to Problem Based charting for further documentation.

## 2015-09-30 NOTE — Assessment & Plan Note (Signed)
Patient doing well since discharge. He denies palpitations, but does report intermittent episodes of feeling jittery and nervous.  He has also noticed that he also feels extremely cold after taking his Amiodarone, though it did not occur last night when he took it with food.  On exam, rate controlled, but irregular. ECG shows continued afib, rate 74 bpm.  A/P: Afib, rate controlled on Amiodarone 200 mg BID.  Patient advised that he can take it with food if he is more comfortable.  His wife also asked about the need for monitoring while on Amiodarone.  Will defer to Dr. Gala Romney regarding need for baseline PFTs as I do not know how long he intends to keep the patient on Amiodarone.  Patient currently not on anticoagulation 2/2 retroperitoneal bleed. - Amiodarone 200 mg BID.

## 2015-09-30 NOTE — Assessment & Plan Note (Signed)
Patient only reports DOE, but not at rest. No orthopnea, weight gain, leg swelling, or PND.  His home baseline weight is 170 lbs since discharge.  He is adherent to medications, salt, and fluid intake.   A/P: HFrEF, presumed 2/2 afib and viral cardiomyopathy.  Patient has not had ischemic workup due to his retroperitoneal bleed. His cholesterol has always been low in the past, so he is not currently on statin therapy, which can be considered in the future.  Will continue current medications and await follow up with Cardiology. - Lasix 40 mg daily - Losartan 25 mg BID - Spironolactone 25 mg daily - Digoxin 0.25 mg daily [ ]  BMP

## 2015-09-30 NOTE — Progress Notes (Signed)
Medicine attending: Medical history, presenting problems, physical findings, and medications, reviewed with resident physician Dr Venia Minks on the day of the patient visit and I concur with his evaluation and management plan. Patient known to me from recent hospitalization. 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 Course complicated by acute, retroperitoneal bleed while on heparin requiring transfusion and pressor support. He had a complete recovery. Good control of CHF symptoms on current med regimen. Still in a fib but rate well controlled. I reviewed EKG today. Anticoagulants still on hold. Ex wife has rallied to his support and is here today.

## 2015-09-30 NOTE — Assessment & Plan Note (Signed)
Patient only reports orange colored urine since diagnosis of the retroperitoneal bleed. His pain is still present but improves every day.  He denies concern.  A/P: Retroperitoneal bleed, improving.  No anticoagulation for his afib at this time.  Will check U/A to determine Hgb vs RBCs. [ ]  U/A [ ]  CBC

## 2015-10-01 LAB — BMP8+ANION GAP
ANION GAP: 18 mmol/L (ref 10.0–18.0)
BUN/Creatinine Ratio: 24 (ref 10–24)
BUN: 17 mg/dL (ref 8–27)
CALCIUM: 9.2 mg/dL (ref 8.6–10.2)
CO2: 21 mmol/L (ref 18–29)
CREATININE: 0.71 mg/dL — AB (ref 0.76–1.27)
Chloride: 95 mmol/L — ABNORMAL LOW (ref 96–106)
GFR calc Af Amer: 115 mL/min/{1.73_m2} (ref >59)
GFR calc non Af Amer: 99 mL/min/{1.73_m2} (ref >59)
Glucose: 94 mg/dL (ref 65–99)
Potassium: 4.6 mmol/L (ref 3.5–5.2)
SODIUM: 134 mmol/L (ref 134–144)

## 2015-10-01 LAB — MICROSCOPIC EXAMINATION
Casts: NONE SEEN /lpf
EPITHELIAL CELLS (NON RENAL): NONE SEEN /HPF (ref 0–10)

## 2015-10-01 LAB — URINALYSIS, ROUTINE W REFLEX MICROSCOPIC
BILIRUBIN UA: NEGATIVE
Glucose, UA: NEGATIVE
KETONES UA: NEGATIVE
LEUKOCYTES UA: NEGATIVE
NITRITE UA: POSITIVE — AB
RBC UA: NEGATIVE
SPEC GRAV UA: 1.024 (ref 1.005–1.030)
Urobilinogen, Ur: 2 mg/dL — ABNORMAL HIGH (ref 0.2–1.0)
pH, UA: 6.5 (ref 5.0–7.5)

## 2015-10-01 LAB — CBC
HEMATOCRIT: 39 % (ref 37.5–51.0)
HEMOGLOBIN: 13 g/dL (ref 12.6–17.7)
MCH: 29.1 pg (ref 26.6–33.0)
MCHC: 33.3 g/dL (ref 31.5–35.7)
MCV: 87 fL (ref 79–97)
Platelets: 322 10*3/uL (ref 150–379)
RBC: 4.46 x10E6/uL (ref 4.14–5.80)
RDW: 15.6 % — ABNORMAL HIGH (ref 12.3–15.4)
WBC: 13.1 10*3/uL — AB (ref 3.4–10.8)

## 2015-10-04 ENCOUNTER — Telehealth (HOSPITAL_COMMUNITY): Payer: Self-pay | Admitting: Cardiology

## 2015-10-04 ENCOUNTER — Telehealth: Payer: Self-pay

## 2015-10-04 NOTE — Telephone Encounter (Signed)
Pt has lost 5lbs since last wed, eating well, pt has only c/o tired and fluctuating hot and cold.  Please advise, just in clinic thurs and had lost appr 2 lbs, please advise

## 2015-10-04 NOTE — Telephone Encounter (Signed)
Patients daughter called with concerns regarding weight Weight is down 5 lbs Patient is not symptomatic (dizziness, light headiness, orthostatic)   Advised this is to be expected post discharge and with taking diuretics At the first sign of dizziness, swimmy head, light headiness, decreased b/p patient should return call for for instructions

## 2015-10-04 NOTE — Telephone Encounter (Signed)
Pt daughter requesting the nurse to call back regarding father weight. Please call back.

## 2015-10-05 NOTE — Telephone Encounter (Signed)
I agreed with Dr. Rogelia Boga. Is he establishing with our clinic? I was under the impression that he would follow with [facility] but glad that he will be establishing with our clinic.

## 2015-10-05 NOTE — Telephone Encounter (Signed)
I agree with the advice HF team gave yesterday. He is being diuresed as EF 15%. Weight loss expected. If he gets dizzy, lightheaded, decreased urine, dry mouth, he will need to come in for an appt. He should cont to weigh daily.

## 2015-10-06 NOTE — Telephone Encounter (Signed)
Called daughter back and gave her the info

## 2015-10-06 NOTE — Telephone Encounter (Signed)
Pt's exwife has called now and states she feels he has lost too much weight, today he weighs 160, she states 173 last week, 13 lbs is too much, he is eating, drinking, c/o of being very tired.  His wife is janet Alen

## 2015-10-07 NOTE — Telephone Encounter (Signed)
PLs sch appt to come in today if possible

## 2015-10-08 NOTE — Telephone Encounter (Signed)
i called last evening and spoke to his wife, they are in [place], cannot come til next week, talked about ED in [place], he has gained a lb. She states if she feels he is getting worse she will take him to ED but otherwise they will be here next week

## 2015-10-11 ENCOUNTER — Encounter: Payer: Self-pay | Admitting: Internal Medicine

## 2015-10-11 ENCOUNTER — Ambulatory Visit (INDEPENDENT_AMBULATORY_CARE_PROVIDER_SITE_OTHER): Payer: Self-pay | Admitting: Internal Medicine

## 2015-10-11 VITALS — BP 100/56 | HR 67 | Temp 97.8°F | Ht 72.0 in | Wt 166.3 lb

## 2015-10-11 DIAGNOSIS — N50811 Right testicular pain: Secondary | ICD-10-CM

## 2015-10-11 DIAGNOSIS — R21 Rash and other nonspecific skin eruption: Secondary | ICD-10-CM

## 2015-10-11 DIAGNOSIS — I5041 Acute combined systolic (congestive) and diastolic (congestive) heart failure: Secondary | ICD-10-CM

## 2015-10-11 DIAGNOSIS — H53451 Other localized visual field defect, right eye: Secondary | ICD-10-CM

## 2015-10-11 MED ORDER — HYDROCORTISONE 1 % EX CREA: TOPICAL_CREAM | CUTANEOUS | Status: AC

## 2015-10-11 NOTE — Patient Instructions (Signed)
Thank you for your visit today Please follow up with the heart failure team Please do the CT scan of the head Please follow up with the eye doctor Please use the cream for your rash as directed for few days  Please follow up here in 3 months, earlier if needed

## 2015-10-11 NOTE — Assessment & Plan Note (Signed)
Patient says that while he was in the hospital, he experienced a sudden loss of right sided peripheral vision and he mentioned it to the medical student at that time. He denies any weakness or numbness, and no further loss of vision, though the right sided visual deficit is still there. On exam, right sided peripheral visual field is diminished with intact left visual field. Other neurologi exam is unremarkable. Fundoscopic exam shows sharp discs and no overt vascular changes, though it is better performed under dilation.   Most likely differential would be a TIA/ chronic infarct in the brain that has persisted but difficult to say without imaging.   Ordered CT brain stat. Explained the necessity of ordering CT scan and that our recommendation was to have it done today. Patient and his girlfriend said they will think about it and may do it later. Explained the risks of delayed evaluation. Referred to opthalmology for dilated eye  Exam

## 2015-10-11 NOTE — Assessment & Plan Note (Signed)
Says rash present on the back for 4-5 days and it is pruritic. Recently changed his body wash from dove to dial.  Likely irritant dermatitis vs medication induced as pt has recently started digoxin and amiodarone.   Patient understands the benefits of being on amiodarone and digoxin and to talk with heart failure team if he thinks it may be a side effect Given hydrocortisone cream for symptomatic relief.  Asked to change back his soap.

## 2015-10-11 NOTE — Progress Notes (Signed)
Patient ID: Reginald Hahn, male   DOB: 01-13-1951, 65 y.o.   MRN: [id number]     Subjective:   Patient ID: Reginald Hahn male   DOB: July 16, 1950 65 y.o.   MRN: [id number]  HPI: [name] is a 65 y.o. man with notable recent history of new onset Afib with RVR, HFrEF with EF of 15% , who is here to discuss about his peripheral right sided diminished visual field, heart failure symptoms, rash and testicular  Discomfort.    Please see Problem List/A&P for the status of the patient's chronic medical problems      Past Medical History  Diagnosis Date  . Allergy     seasonal allergies  . Arthritis   . Tobacco use     x40 years. 1 ppd  . Acute combined systolic and diastolic heart failure (HCC) 09/16/2015   Current Outpatient Prescriptions  Medication Sig Dispense Refill  . amiodarone (PACERONE) 200 MG tablet Take 1 tablet (200 mg total) by mouth 2 (two) times daily. 60 tablet 0  . digoxin (LANOXIN) 0.25 MG tablet Take 1 tablet (0.25 mg total) by mouth daily. 30 tablet 0  . furosemide (LASIX) 40 MG tablet Take 1 tablet (40 mg total) by mouth daily. 30 tablet 0  . losartan (COZAAR) 25 MG tablet Take 1 tablet (25 mg total) by mouth 2 (two) times daily. 60 tablet 0  . nicotine (NICODERM CQ - DOSED IN MG/24 HOURS) 21 mg/24hr patch Place 1 patch (21 mg total) onto the skin daily. 28 patch 0  . potassium chloride SA (K-DUR,KLOR-CON) 20 MEQ tablet Take 1 tablet (20 mEq total) by mouth daily. 60 tablet 0  . spironolactone (ALDACTONE) 25 MG tablet Take 1 tablet (25 mg total) by mouth daily. 30 tablet 0  . hydrocortisone cream 1 % Apply to affected area 2 times daily 30 g 1   No current facility-administered medications for this visit.   Family History  Problem Relation Age of Onset  . Heart disease Mother     died in her 87's.  . Prostate cancer Father     died in his 41's.  . Congenital heart disease Brother     died @ age 80.   Social History   Social History  . Marital  Status: Married    Spouse Name: N/A  . Number of Children: N/A  . Years of Education: N/A   Social History Main Topics  . Smoking status: Former Smoker -- 1.00 packs/day for 40 years    Types: Cigarettes  . Smokeless tobacco: None     Comment: Quit since d/c from hospital. Wears nicotene patch.  . Alcohol Use: No  . Drug Use: No     Comment: Used to smoke marijuana  . Sexual Activity: Not Asked   Other Topics Concern  . 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  Patient has a scale at home and he weighs 160 lbs there today but it is 166 lbs here. Baseline 170 lbs. HEENT: No headaches, decreased right peripheral vision loss, no bilateral blurry vision.  Respiratory: intermittent dyspnea.  Cardiovascular: Negative for orthopnea, or PND   Gastrointestinal: Negative for  nausea, vomiting.  No hematochezia, or melena  GU: negative for dysuria or hematuria, or penile discharge.  Musculoskeletal: Negative for myalgias, joint pain, falls and neck pain.  Skin: rash on the back   Objective:  Physical Exam: Filed Vitals:   10/11/15 1552  BP: 100/56  Pulse: 67  Temp: 97.8 F (36.6 C)  TempSrc: Oral  Height: 6' (1.829 m)  Weight: 166 lb 4.8 oz (75.433 kg)  SpO2: 98%    General: A&O, in NAD HEENT: EOMI, sclera white, conjunctiva pink  Fundoscopic exam: sharp discs and no overt vascular changes CV: irregular rhythm, no murmur appreciated Resp: equal and symmetric breath sounds Abdomen: soft, nontender, nondistended, +BS in all 4 quadrants Skin: maculopapular rash on his back GU: normal testicular exam. nontender to palpation. No rash  Neurologic:  CN: EOMI, decreased peripheral visual field on the right side Motor: 5/5 strength in  upper, 5/5 in lower extremities, normal muscle tone Cerebellar: gait normal    Assessment & Plan:   Case discussed with Dr Criselda Peaches Please see problem based charting for assessment and plan

## 2015-10-11 NOTE — Assessment & Plan Note (Signed)
Patient said he started having right sided testicular pain around the time he had the retroperitoneal bleed during the last hospital admission. He says that the pain is dull, and occurs off and on. He denies any hematuria or discharge. He is not sexually active. He denies any fevers or chills. He says lifting it does not improve the pain.  On exam , Do not see and hydrocele, no overt varicocele on exam. Nontender to testicle palpation. Unlikely to be epididymitis, torsion.  Plan Continued observance  Advised to take tylenol for symptomatic relief  RTC if it does not improve in 2 months, or if it worsens

## 2015-10-11 NOTE — Assessment & Plan Note (Addendum)
Patient has an appt with AHF Team. He is compliant with his meds. Weight is 166 lbs which is around his baseline weight. He denies increased dyspnea or edema.   He mentioned that he is feeling more fatigued recently. I explained that is likely due to his very low EF, and that he is on 2 new medications so in time, it may improve. His TSH was checked recently and was WNL.   Encouraged him to follow up with heart failure

## 2015-10-14 ENCOUNTER — Encounter (HOSPITAL_COMMUNITY): Payer: Self-pay | Admitting: Internal Medicine

## 2015-10-14 ENCOUNTER — Ambulatory Visit (HOSPITAL_COMMUNITY)
Admit: 2015-10-14 | Discharge: 2015-10-14 | Disposition: A | Discharge status: Home / Self Care | Payer: Self-pay | Source: Ambulatory Visit | Attending: Internal Medicine | Admitting: Internal Medicine

## 2015-10-14 VITALS — BP 102/64 | HR 64 | Wt 164.8 lb

## 2015-10-14 DIAGNOSIS — Z87891 Personal history of nicotine dependence: Secondary | ICD-10-CM | POA: Insufficient documentation

## 2015-10-14 DIAGNOSIS — I4891 Unspecified atrial fibrillation: Secondary | ICD-10-CM

## 2015-10-14 DIAGNOSIS — Z8249 Family history of ischemic heart disease and other diseases of the circulatory system: Secondary | ICD-10-CM | POA: Insufficient documentation

## 2015-10-14 DIAGNOSIS — I5022 Chronic systolic (congestive) heart failure: Secondary | ICD-10-CM

## 2015-10-14 DIAGNOSIS — K661 Hemoperitoneum: Secondary | ICD-10-CM

## 2015-10-14 DIAGNOSIS — Z79899 Other long term (current) drug therapy: Secondary | ICD-10-CM | POA: Insufficient documentation

## 2015-10-14 DIAGNOSIS — M1990 Unspecified osteoarthritis, unspecified site: Secondary | ICD-10-CM | POA: Insufficient documentation

## 2015-10-14 DIAGNOSIS — R58 Hemorrhage, not elsewhere classified: Secondary | ICD-10-CM

## 2015-10-14 DIAGNOSIS — I5041 Acute combined systolic (congestive) and diastolic (congestive) heart failure: Secondary | ICD-10-CM

## 2015-10-14 DIAGNOSIS — Z720 Tobacco use: Secondary | ICD-10-CM

## 2015-10-14 DIAGNOSIS — H547 Unspecified visual loss: Secondary | ICD-10-CM | POA: Insufficient documentation

## 2015-10-14 DIAGNOSIS — H53451 Other localized visual field defect, right eye: Secondary | ICD-10-CM

## 2015-10-14 DIAGNOSIS — I5021 Acute systolic (congestive) heart failure: Secondary | ICD-10-CM | POA: Insufficient documentation

## 2015-10-14 MED ORDER — ASPIRIN EC 81 MG PO TBEC: 81.0000 mg | DELAYED_RELEASE_TABLET | Freq: Every day | ORAL | Status: DC

## 2015-10-14 NOTE — Progress Notes (Signed)
ADVANCED HF CLINIC NOTE  Patient ID: Reginald Hahn, male   DOB: 05/04/1950, 65 y.o.   MRN: [id number] PCP: Primary Cardiologist:  HPI: [name] is a 65 y.o. male with h/o COPD admitted in 6/17 with acute systolic HF, AF with RVR and cardiogenic shock. Presents for post-hospital f/u.   Patient was admitted 6/7 with acute systolic HF and new onset afib with RVR and EF found to be 15%.Felt to to have viral CM exacerbated by afib. Diuresed slowly so PICC placed and co-ox low. Started on IV amio and milrinone and subsequently diuresed well. Was pending cath and TEE/DC-CV but developed massive RP bleed and was transferred to ICU.   All anti-coagulants held. Received multiple transfusions. Subsequently recovered. During that time also had loss of peripheral vision in R eye. Head CT negative. Pre discharge had Myoview with EF 30-44%.  Inferior infarct vs gut attenuation. No ischemia.   Presents for post-hospital f/u. Feels much better. Getting around. This week able to do ADLs. Still using electric cart in store. No CP. Not sleeping well. No orthopnea, PND or edema. No longer smoking. Using a patch. Lost peripheral vision in R eye druign hospital    Myoview 6/17  There was no ST segment deviation noted during stress.  The left ventricular ejection fraction is moderately decreased (30-44%).  Findings consistent with prior myocardial infarction.  This is an intermediate risk study.  nferior/inferolateral defect (moderate) that does not improve in the rest images consistent with scar and possible soft tissue attenuation No significant ischemia.   ROS: All systems negative except as listed in HPI, PMH and Problem List.  Past Medical History  Diagnosis Date  . Allergy     seasonal allergies  . Arthritis   . Tobacco use     x40 years. 1 ppd  . Acute combined systolic and diastolic heart failure (HCC) 09/16/2015     SH:  Social History   Social History  . Marital Status:  Married    Spouse Name: N/A  . Number of Children: N/A  . Years of Education: N/A   Occupational History  . Not on file.   Social History Main Topics  . Smoking status: Former Smoker -- 1.00 packs/day for 40 years    Types: Cigarettes  . Smokeless tobacco: Not on file     Comment: Quit since d/c from hospital. Wears nicotene patch.  . Alcohol Use: No  . Drug Use: No     Comment: Used to smoke marijuana  . Sexual Activity: Not on file   Other Topics Concern  . 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 Heart disease Mother     died in her 61's.  . Prostate cancer Father     died in his 32's.  . Congenital heart disease Brother     died @ age 81.    Past Medical History  Diagnosis Date  . Allergy     seasonal allergies  . Arthritis   . Tobacco use     x40 years. 1 ppd  . Acute combined systolic and diastolic heart failure (HCC) 09/16/2015    Current Outpatient Prescriptions  Medication Sig Dispense Refill  . amiodarone (PACERONE) 200 MG tablet Take 1 tablet (200 mg  total) by mouth 2 (two) times daily. 60 tablet 0  . digoxin (LANOXIN) 0.25 MG tablet Take 1 tablet (0.25 mg total) by mouth daily. 30 tablet 0  . furosemide (LASIX) 40 MG tablet Take 1 tablet (40 mg total) by mouth daily. 30 tablet 0  . hydrocortisone cream 1 % Apply to affected area 2 times daily 30 g 1  . losartan (COZAAR) 25 MG tablet Take 1 tablet (25 mg total) by mouth 2 (two) times daily. 60 tablet 0  . nicotine (NICODERM CQ - DOSED IN MG/24 HOURS) 21 mg/24hr patch Place 1 patch (21 mg total) onto the skin daily. 28 patch 0  . potassium chloride SA (K-DUR,KLOR-CON) 20 MEQ tablet Take 1 tablet (20 mEq total) by mouth daily. 60 tablet 0  . spironolactone (ALDACTONE) 25 MG tablet Take 1 tablet (25 mg total) by mouth daily.  30 tablet 0   No current facility-administered medications for this encounter.    Filed Vitals:   10/14/15 1528  BP: 102/64  Pulse: 64  Weight: 164 lb 12 oz (74.73 kg)  SpO2: 98%    PHYSICAL EXAM:  General:  Well appearing. No resp difficulty HEENT: normal Neck: supple. JVP flat. Carotids 2+ bilaterally; no bruits. No lymphadenopathy or thryomegaly appreciated. Cor: PMI normal. Regular rate & rhythm. No rubs, gallops or murmurs. Lungs: clear with decreased BS throughout Abdomen: soft, nontender, nondistended. No hepatosplenomegaly. No bruits or masses. Good bowel sounds. Extremities: no cyanosis, clubbing, rash, edema Neuro: alert & orientedx3, right peripheral field cut. Moves all 4 extremities w/o difficulty. Affect pleasant.   ECG: NSR No ST-T wave abnormalities.    ASSESSMENT & PLAN:  1. Acute systolic HF due to probable viral CM --EF 15% by echo. ~35-40% by Myoview --Much improved. Volume status looks good.  --Continue current regimen. HR too low to add carvedilol.  --Refer to [facility] --Repeat echo in 6 weeks 2. AF with RVR --Now back in NSR on amio --Reluctant to anticoagulate with large RP bleed. Start ASA 81\ --This patients CHA2DS2-VASc Score and unadjusted Ischemic Stroke Rate (% per year) is equal to 1-2 3. RP bleed --resolved 4. Tobacco use --congratulated on cessation 5. Abnomal Myoview --fixed inferior defect. May be gut attenuation. However given LV dysfunction and CRFs will need cath as outpatient. -ECASA 81 started 6. Loss of R peripheral vision --concerning for embolic CVA in setting of AF --refer to Neuro. Will need MRI and carotid dopplers.     Total time spent 45 minutes. Over half that time spent discussing above.   Jennavieve Arrick,MD 10:55 PM

## 2015-10-14 NOTE — Patient Instructions (Signed)
Start Aspirin 81 mg daily  You have been referred to [facility] Neurology, they will call you to schedule  Your physician recommends that you schedule a follow-up appointment in: 6 weeks with an echocardiogram

## 2015-10-18 NOTE — Addendum Note (Signed)
Addended by: Debe Coder B on: 10/18/2015 10:05 AM   Modules accepted: Level of Service

## 2015-10-18 NOTE — Progress Notes (Signed)
Internal Medicine Clinic Attending  Case discussed with Dr. Saraiya at the time of the visit.  We reviewed the resident's history and exam and pertinent patient test results.  I agree with the assessment, diagnosis, and plan of care documented in the resident's note.  

## 2015-10-25 ENCOUNTER — Other Ambulatory Visit (HOSPITAL_COMMUNITY): Payer: Self-pay | Admitting: *Deleted

## 2015-10-25 MED ORDER — SPIRONOLACTONE 25 MG PO TABS: 25.0000 mg | ORAL_TABLET | Freq: Every day | ORAL | Status: DC

## 2015-10-25 MED ORDER — LOSARTAN POTASSIUM 25 MG PO TABS: 25.0000 mg | ORAL_TABLET | Freq: Two times a day (BID) | ORAL | Status: DC

## 2015-10-25 MED ORDER — POTASSIUM CHLORIDE CRYS ER 20 MEQ PO TBCR: 20.0000 meq | EXTENDED_RELEASE_TABLET | Freq: Every day | ORAL | Status: DC

## 2015-10-25 MED ORDER — AMIODARONE HCL 200 MG PO TABS: 200.0000 mg | ORAL_TABLET | Freq: Two times a day (BID) | ORAL | Status: DC

## 2015-10-25 MED ORDER — DIGOXIN 250 MCG PO TABS: 0.2500 mg | ORAL_TABLET | Freq: Every day | ORAL | Status: DC

## 2015-10-25 MED ORDER — FUROSEMIDE 40 MG PO TABS: 40.0000 mg | ORAL_TABLET | Freq: Every day | ORAL | Status: DC

## 2015-11-01 NOTE — Discharge Summary (Signed)
NOTE: This discharge note is a duplicate from the original discharge summary which I previously signed 09/30/15 with an amended date of service to reflect the actual date for day of discharge.  Name: Reginald Hahn MRN: [id number] DOB: May 05, 1950 65 y.o. PCP: Beather Arbour, MD  Date of Admission: 09/15/2015 10:07 AM Date of Discharge: 09/28/2015 Attending Physician: Dr. Earl Lagos  Discharge Diagnosis: Principal Problem:   Acute combined systolic and diastolic heart failure (HCC) Active Problems:   Atrial fibrillation with rapid ventricular response (HCC)   Tobacco abuse   Transaminitis   AKI (acute kidney injury) (HCC)   Atrial fibrillation (HCC)   Cardiogenic shock (HCC)   Retroperitoneal bleed   Hemorrhagic shock  Discharge Medications:   STOP taking these medications        ADVIL 200 MG tablet  Generic drug:  ibuprofen         TAKE these medications        amiodarone 200 MG tablet  Commonly known as:  PACERONE  Take 1 tablet (200 mg total) by mouth 2 (two) times daily.     digoxin 0.25 MG tablet  Commonly known as:  LANOXIN  Take 1 tablet (0.25 mg total) by mouth daily.     furosemide 40 MG tablet  Commonly known as:  LASIX  Take 1 tablet (40 mg total) by mouth daily.     losartan 25 MG tablet  Commonly known as:  COZAAR  Take 1 tablet (25 mg total) by mouth 2 (two) times daily.     nicotine 21 mg/24hr patch  Commonly known as:  NICODERM CQ - dosed in mg/24 hours  Place 1 patch (21 mg total) onto the skin daily.     potassium chloride SA 20 MEQ tablet  Commonly known as:  K-DUR,KLOR-CON  Take 1 tablet (20 mEq total) by mouth daily.     spironolactone 25 MG tablet  Commonly known as:  ALDACTONE  Take 1 tablet (25 mg total) by mouth daily.         Disposition and follow-up:   Reginald Hahn was discharged from [facility] in Stable condition.     Follow-up Appointments: Follow-up Information      Laban Emperor, MD. Go on 09/30/2015.   Specialty:  Internal Medicine Why:  At 8:15AM for hospital follow-up Contact information: [address] [place] [address] [phone]        Arvilla Meres, MD On 10/14/2015.   Specialty:  Cardiology Why:  at 3:00 Garage Code [id number]  Contact information: [address] [place] [address] [phone]           Discharge Instructions: Discharge Instructions    (HEART FAILURE PATIENTS) Call MD:  Anytime you have any of the following symptoms: 1) 3 pound weight gain in 24 hours or 5 pounds in 1 week 2) shortness of breath, with or without a dry hacking cough 3) swelling in the hands, feet or stomach 4) if you have to sleep on extra pillows at night in order to breathe.    Complete by:  As directed   Call MD for:  difficulty breathing, headache or visual disturbances    Complete by:  As directed   Call MD for:  extreme fatigue    Complete by:  As directed   Call MD for:  persistant dizziness or light-headedness    Complete by:  As directed   Call MD for:  persistant nausea and vomiting  Complete by:  As directed   Call MD for:  severe uncontrolled pain    Complete by:  As directed   Call MD for:  temperature >100.4    Complete by:  As directed   Diet - low sodium heart healthy    Complete by:  As directed   Increase activity slowly    Complete by:  As directed      Consultations: Treatment Team:  Rounding Lbcardiology, MD  Procedures Performed:  No results found.  2D Echo:  09/16/15 - Left ventricle: The cavity size was normal. There was mild  concentric hypertrophy. Systolic function was normal. The  estimated ejection fraction was 15%. Severe diffuse hypokinesis  with regional variations. The study was not technically  sufficient to allow evaluation of LV diastolic dysfunction due to  atrial fibrillation. - Mitral valve: Calcified annulus. There was mild regurgitation. - Left atrium: The  atrium was mildly dilated. - Right ventricle: The cavity size was moderately dilated. Wall  thickness was normal. Systolic function was moderately to  severely reduced. - Right atrium: The atrium was severely dilated. - Tricuspid valve: There was mild-moderate regurgitation. - Pulmonary arteries: PA peak pressure: 34 mm Hg (S).  09/26/15 - Left ventricle: The cavity size was mildly dilated. Wall  thickness was increased in a pattern of mild LVH. The estimated  ejection fraction was 25%. Diffuse hypokinesis. - Left atrium: The atrium was mildly dilated. - Right atrium: The atrium was mildly dilated. - Atrial septum: No defect or patent foramen ovale was identified. - Impressions: Abnormal GLS - 10.3 RV less dilated than previous  Incomplete study no color flow done other than parasternal images   Admission HPI: Reginald Hahn is a 65 year old male with 10-pack year tobacco use who presented with two-week history of worsening dyspnea. About one month ago, he suffered a bout of viral gastroenteritis which consisted of diarrhea, nausea, vomiting. These symptoms lasted for 3 days and then he improved over the next 2 weeks. However over the last 2 weeks he has noted the progressive onset of worsening dyspnea to the point where he felt "forced inspiration." His symptoms are also associated with episodes of palpitations, dizziness to the point of feeling disorientated which was most pronounced yesterday when his vision became blurry. This morning he awoke and felt his symptoms were getting worse, so he went to urgent care who then referred him to the emergency department. He otherwise denies any prior history of cardiac or pulmonary problems, chest pain, headache, numbness, tingling, abdominal pain, leg swelling, leg pain, calf tenderness though he does feel his legs have been sore with movement over the last several days. He owns a Teaching laboratory technician. He does not regularly follow with a doctor and only  takes ibuprofen as needed for headaches.   In the ED, he is found to be in A. fib with heart rate in the 140s. He received ASA 325mg , Lasix 20mg  IV, Solumedrol 125mg  IV and was started on diltiazem IV.   Hospital Course by problem list: Principal Problem:   Acute combined systolic and diastolic heart failure (HCC) Active Problems:   Atrial fibrillation with rapid ventricular response (HCC)   Tobacco abuse   Transaminitis   AKI (acute kidney injury) (HCC)   Atrial fibrillation (HCC)   Cardiogenic shock (HCC)   Retroperitoneal bleed   Hemorrhagic shock   New onset systolic and diastolic CHF: Likely 2/2 new-onset a-fib and viral cardiomyopathy. CTA negative for PE. ABI study reassuring for no  limb ischemia despite pallor and cool extremities on admission. On initial rate control, he developed transaminitis and AKI consistent with cardiogenic shock and was transitioned to amiodarone, digoxin, spironolactone, and losartan with milrinone to assist diuresis though these medications were temporarily adjusted when he was transferred to the ICU for hemorrhagic shock [see below] though resumed when he was stable. Repeat echo on 6/18 showed EF 25% [see findings above]. Catheterization and cardioversion were deferred as he could not be anticoagulated. Myoview notable for intermediate risk study though thought to be artifact given soft tissue attenuation and otherwise reassuring  At discharge, he was still in atrial fibrillation and no oxygen requirement. -Confirm patient picked up new medications [see above] -Reassess weight: 72.2 kg at discharge -Recheck BMET -Ensure patient knows of follow-up with Dr. Kittie Plater and follow-up with [facility] [medication assistance] -Consider referral for baseline PFTs and eye exam if amiodarone is to be continued for long-term  Hemorrhagic shock 2/2 large right-sided spontaneous retroperitoneal hematoma: His course was complicated by hemorrhagic shock 2/2  spontaneous retroperitoneal hematoma on 09/21/15. Heparin was stopped. In the ICU, he required vasopressin, fluid resuscitation, 3u pRBC, and 4u FFP. Hb stabilized at 10-11 after he was transferred out through discharge. Serial CT imaging showed stable findings.  -Reassess pain and abdominal exam at follow -Recheck CBC  Hypotonic hyponatremia: Likely 2/2 hypervolemic state. Na 128 on admission improved to 132 on discharge with medications noted above.  Tobacco abuse: Used patch  while inpatient and wishes to quit.  -Encourage cessation  Pre-diabetic: A1c 5.9% inpatient -Encourage lifestyle modification  Discharge Vitals:   BP 117/82 (BP Location: Left Arm)   Pulse 93   Temp 98.2 F (36.8 C) (Oral)   Resp 18   Ht 6' (1.829 m)   Wt 172 lb 4.8 oz (78.2 kg) Comment: Scale A  SpO2 94%   BMI 23.37 kg/m   Discharge Labs:  No results found for this or any previous visit (from the past 24 hour(s)).  Signed: Beather Arbour, MD 11/01/2015, 4:40 PM    Services Ordered on Discharge: None Equipment Ordered on Discharge: None

## 2015-11-05 ENCOUNTER — Encounter: Payer: Self-pay | Admitting: Internal Medicine

## 2015-11-05 DIAGNOSIS — Z0271 Encounter for disability determination: Secondary | ICD-10-CM

## 2015-11-08 NOTE — Addendum Note (Signed)
Addended by: Dorie Rank E on: 11/08/2015 11:00 PM   Modules accepted: Orders

## 2015-11-15 ENCOUNTER — Ambulatory Visit (HOSPITAL_COMMUNITY): Admission: RE | Admit: 2015-11-15 | Payer: Self-pay | Source: Ambulatory Visit

## 2015-11-22 ENCOUNTER — Ambulatory Visit: Payer: Self-pay | Admitting: Neurology

## 2015-12-01 ENCOUNTER — Ambulatory Visit (HOSPITAL_COMMUNITY)
Admission: RE | Admit: 2015-12-01 | Discharge: 2015-12-01 | Disposition: A | Discharge status: Home / Self Care | Payer: Self-pay | Source: Ambulatory Visit | Attending: Internal Medicine | Admitting: Internal Medicine

## 2015-12-01 VITALS — BP 128/67 | HR 70 | Wt 171.0 lb

## 2015-12-01 DIAGNOSIS — I77810 Thoracic aortic ectasia: Secondary | ICD-10-CM | POA: Insufficient documentation

## 2015-12-01 DIAGNOSIS — H53451 Other localized visual field defect, right eye: Secondary | ICD-10-CM

## 2015-12-01 DIAGNOSIS — I5022 Chronic systolic (congestive) heart failure: Secondary | ICD-10-CM

## 2015-12-01 DIAGNOSIS — E119 Type 2 diabetes mellitus without complications: Secondary | ICD-10-CM | POA: Insufficient documentation

## 2015-12-01 DIAGNOSIS — I5041 Acute combined systolic (congestive) and diastolic (congestive) heart failure: Secondary | ICD-10-CM

## 2015-12-01 DIAGNOSIS — R58 Hemorrhage, not elsewhere classified: Secondary | ICD-10-CM

## 2015-12-01 DIAGNOSIS — Z720 Tobacco use: Secondary | ICD-10-CM | POA: Insufficient documentation

## 2015-12-01 DIAGNOSIS — I4891 Unspecified atrial fibrillation: Secondary | ICD-10-CM

## 2015-12-01 DIAGNOSIS — I517 Cardiomegaly: Secondary | ICD-10-CM | POA: Insufficient documentation

## 2015-12-01 MED ORDER — AMIODARONE HCL 200 MG PO TABS: 200.0000 mg | ORAL_TABLET | Freq: Every day | ORAL | 3 refills | Status: DC

## 2015-12-01 NOTE — Patient Instructions (Signed)
STOP Lasix and Digoxin.  Decrease Amiodarone to 200mg  daily.  Your provider requests you have carotid dopplers.  Your provider requests you  Have an MRI/MRA of the brain.  They will call to schedule appointment.  Follow up with Dr.Bensimhon in 3 months

## 2015-12-01 NOTE — Progress Notes (Signed)
  Echocardiogram 2D Echocardiogram has been performed.  Arvil Chaco 12/01/2015, 1:56 PM

## 2015-12-01 NOTE — Progress Notes (Signed)
  Echocardiogram 2D Echocardiogram has been performed.  Reginald Hahn 12/01/2015, 1:55 PM

## 2015-12-01 NOTE — Progress Notes (Signed)
ADVANCED HF CLINIC NOTE  Patient ID: Reginald Hahn, male   DOB: 1950/12/29, 65 y.o.   MRN: [id number] PCP: Primary Cardiologist:  HPI: Reginald Hahn is a 65 y.o. male with h/o COPD admitted in 6/17 with acute systolic HF, AF with RVR and cardiogenic shock. Presents for post-hospital f/u.   Patient was admitted 6/7 with acute systolic HF and new onset afib with RVR and EF found to be 15%.Felt to to have viral CM exacerbated by afib. Diuresed slowly so PICC placed and co-ox low. Started on IV amio and milrinone and subsequently diuresed well. Was pending cath and TEE/DC-CV but developed massive RP bleed and was transferred to ICU.   All anti-coagulants held. Received multiple transfusions. Subsequently recovered. During that time also had loss of peripheral vision in R eye. Head CT negative. Pre discharge had Myoview with EF 30-44%.  Inferior infarct vs gut attenuation. No ischemia.   Today he returns for HF follow up. Moving to [facility] tomorrow but want to come for cardiac care here at [facility]. Complaining of fatigue and dizziness. Dizzy when standing. Able to walk around [facility] but has a little fatigue. SOB after walking up steps. Weight at home 171 pounds. No Bleeding problems. Has not smoked since recent discharge. Vision has improved no longer with peripheral field cut. Taking all medications. 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 Not lifting or crawling.    Myoview 6/17  There was no ST segment deviation noted during stress.  The left ventricular ejection fraction is moderately decreased (30-44%).  Findings consistent with prior myocardial infarction.  This is an intermediate risk study.  nferior/inferolateral defect (moderate) that does not improve in the rest images consistent with scar and possible soft tissue attenuation No significant ischemia.   ROS: All systems negative except as listed in HPI, PMH and Problem List.  Past Medical History:  Diagnosis  Date  . Acute combined systolic and diastolic heart failure (HCC) 09/16/2015  . Allergy    seasonal allergies  . Arthritis   . Tobacco use    x40 years. 1 ppd     SH:  Social History   Social History  . Marital status: Married    Spouse name: N/A  . Number of children: N/A  . Years of education: N/A   Occupational History  . Not on file.   Social History Main Topics  . Smoking status: Former Smoker    Packs/day: 1.00    Years: 40.00    Types: Cigarettes  . Smokeless tobacco: Not on file     Comment: Quit since d/c from hospital. Wears nicotene patch.  . Alcohol use No  . Drug use: No     Comment: Used to smoke marijuana  . Sexual activity: Not on file   Other Topics Concern  . 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 Heart disease Mother     died in her 24's.  . Prostate cancer Father     died in his 6's.  . Congenital heart disease Brother     died @ age 35.    Past Medical History:  Diagnosis Date  . Acute combined systolic and diastolic heart failure (HCC) 09/16/2015  . Allergy  seasonal allergies  . Arthritis   . Tobacco use    x40 years. 1 ppd    Current Outpatient Prescriptions  Medication Sig Dispense Refill  . amiodarone (PACERONE) 200 MG tablet Take 1 tablet (200 mg total) by mouth 2 (two) times daily. 60 tablet 3  . aspirin EC 81 MG tablet Take 1 tablet (81 mg total) by mouth daily. 90 tablet 3  . digoxin (LANOXIN) 0.25 MG tablet Take 1 tablet (0.25 mg total) by mouth daily. 30 tablet 3  . furosemide (LASIX) 40 MG tablet Take 1 tablet (40 mg total) by mouth daily. 30 tablet 3  . losartan (COZAAR) 25 MG tablet Take 1 tablet (25 mg total) by mouth 2 (two) times daily. 60 tablet 3  . nicotine (NICODERM CQ - DOSED IN MG/24 HOURS) 21 mg/24hr patch Place 1 patch  (21 mg total) onto the skin daily. 28 patch 0  . potassium chloride SA (K-DUR,KLOR-CON) 20 MEQ tablet Take 1 tablet (20 mEq total) by mouth daily. 60 tablet 3  . spironolactone (ALDACTONE) 25 MG tablet Take 1 tablet (25 mg total) by mouth daily. 30 tablet 3   No current facility-administered medications for this encounter.     Vitals:   12/01/15 1414  BP: 128/67  Pulse: 70  SpO2: 96%  Weight: 171 lb (77.6 kg)    PHYSICAL EXAM:  General:  Well appearing. No resp difficulty HEENT: normal Neck: supple. JVP flat. Carotids 2+ bilaterally; no bruits. No lymphadenopathy or thryomegaly appreciated. Cor: PMI normal. Regular rate & rhythm. No rubs, gallops or murmurs. Lungs: clear with decreased BS throughout Abdomen: soft, nontender, nondistended. No hepatosplenomegaly. No bruits or masses. Good bowel sounds. Extremities: no cyanosis, clubbing, rash, edema Neuro: alert & orientedx3, right peripheral field cut. Moves all 4 extremities w/o difficulty. Affect pleasant.    ASSESSMENT & PLAN:  1. Chronic Systolic HF due to probable viral CM --EF 15% by echo. ~35-40% by Myoview. Todays ECHO was reviewed and discussed today. EF now 50%.  --Stop dig and lasix.  2. AF with RVR --Now back in NSR on amio. Cut back amio to 200 mg daily  --Reluctant to anticoagulate with h/o large RP bleed. Start ASA 81 --This patients CHA2DS2-VASc Score and unadjusted Ischemic Stroke Rate (% per year) is equal to 1-2 3. RP bleed --resolved 4. Tobacco use --congratulated on cessation 5. Abnomal Myoview --fixed inferior defect. May be gut attenuation.  Discussed outpatient LHC however he would like to hold off.  -ECASA 81 started 6. Loss of R peripheral vision --concerning for embolic CVA in setting of AF --refer to Neuro but he declines. Will need MRI and carotid dopplers.    Follow up in 3 months   Amy Clegg NP-C  2:18 PM  Patient seen and examined with Tonye Becket, NP. We discussed all aspects of the  encounter. I agree with the assessment and plan as stated above.   He is much improved. Echo reviewed personally with him and EF now 50%. Suspect he had AF related cardiomyopathy in setting of URI.  Myoview shows inferior defect but may be gut attenuation. NYHA II. Volume status ok. Will stop digoxin and lasix. Discussed role of cath to exclude CAD given RFs and abnormal Myoview but he wants to defer. Maintaining NSR on amio. Refuses AC. Refuses neuro work-up for field cut which has now resolved. We have ordered MRI and carotid Dopplers. Hopefully he will f/u. Continue ASA. Refuses statin.   Nerea Bordenave,MD 9:57 PM

## 2015-12-07 ENCOUNTER — Telehealth (HOSPITAL_COMMUNITY): Payer: Self-pay | Admitting: *Deleted

## 2015-12-07 NOTE — Telephone Encounter (Signed)
Pts wife called left a vm requesting patients Echo results.  Dr.Bensimhon discussed Echo in visit but patient did not know EF %. I left a voice message for pt to call back. His last echo showed his EF at 45%

## 2015-12-09 ENCOUNTER — Telehealth (HOSPITAL_COMMUNITY): Payer: Self-pay | Admitting: *Deleted

## 2015-12-09 NOTE — Telephone Encounter (Signed)
Records faxed from 2016-present.

## 2015-12-27 ENCOUNTER — Encounter (HOSPITAL_COMMUNITY): Payer: Self-pay

## 2015-12-27 NOTE — Progress Notes (Signed)
Patient faxed request for Dr. Gala Romney to complete a letter for a leave of absence from work due to an inability to work from CHF diagnosis. Will forward to Dr. Gala Romney and place faxed request along with last OV note in Dr. Prescott Gum folder for review.  Ave Filter, RN

## 2016-01-04 ENCOUNTER — Ambulatory Visit (HOSPITAL_COMMUNITY)
Admission: RE | Admit: 2016-01-04 | Discharge: 2016-01-04 | Disposition: A | Discharge status: Home / Self Care | Payer: Self-pay | Source: Ambulatory Visit | Attending: Internal Medicine | Admitting: Internal Medicine

## 2016-01-04 ENCOUNTER — Other Ambulatory Visit: Payer: Self-pay | Admitting: Internal Medicine

## 2016-01-04 ENCOUNTER — Telehealth (HOSPITAL_COMMUNITY): Payer: Self-pay | Admitting: *Deleted

## 2016-01-04 DIAGNOSIS — I5041 Acute combined systolic (congestive) and diastolic (congestive) heart failure: Secondary | ICD-10-CM | POA: Insufficient documentation

## 2016-01-04 DIAGNOSIS — I6349 Cerebral infarction due to embolism of other cerebral artery: Secondary | ICD-10-CM

## 2016-01-04 DIAGNOSIS — H547 Unspecified visual loss: Secondary | ICD-10-CM

## 2016-01-04 DIAGNOSIS — Z736 Limitation of activities due to disability: Secondary | ICD-10-CM

## 2016-01-04 LAB — VAS US CAROTID
LEFT ECA DIAS: -16 cm/s
LEFT VERTEBRAL DIAS: -13 cm/s
Left CCA dist dias: 23 cm/s
Left CCA dist sys: 82 cm/s
Left CCA prox dias: 29 cm/s
Left CCA prox sys: 117 cm/s
Left ICA dist dias: -35 cm/s
Left ICA dist sys: -89 cm/s
Left ICA prox dias: 35 cm/s
Left ICA prox sys: 82 cm/s
RCCADSYS: -81 cm/s
RCCAPDIAS: 18 cm/s
RIGHT ECA DIAS: -17 cm/s
RIGHT VERTEBRAL DIAS: 13 cm/s
Right CCA prox sys: 84 cm/s

## 2016-01-04 NOTE — Progress Notes (Signed)
Preliminary results by tech - Carotid Duplex Completed. No evidence of a significant stenosis noted in bilateral carotid arteries. Vertebral arteries demonstrated antegrade flow. Reginald Hahn, BS, RDMS, RVT  

## 2016-01-04 NOTE — Telephone Encounter (Signed)
Pt and wife came into office today to discuss some concerns:  1. Echo results 2. Disability letter 3. Pain/numbness in upper thighs and groin area, they feel could be r/t bleed in the hospital.  He states that the pain is like a "sharpe, shooting pain" only last a sec or two then is gone.  Discussed all above with Dr Gala Romney, he advised that EF has recovered to 45-50%, pt will not qualify for disability and from cardiac standpoint is fine to return to work.  He feels that the leg pain and numbness is not r/t bleed and pt should see neuro, pt had a CVA in the hospital.  Pt and wife aware and agreeable.  Referral to neurology has been placed.

## 2016-01-04 NOTE — Progress Notes (Signed)
This was handled in clinic today.   Reginald Ezekiel,MD 4:11 PM

## 2016-01-10 ENCOUNTER — Telehealth (HOSPITAL_COMMUNITY): Payer: Self-pay | Admitting: Vascular Surgery

## 2016-01-10 NOTE — Telephone Encounter (Signed)
Called [facility] neuro to schedule pt, left message on VM for office to call pt to schedule appt

## 2016-01-11 ENCOUNTER — Telehealth (HOSPITAL_COMMUNITY): Payer: Self-pay | Admitting: *Deleted

## 2016-01-11 NOTE — Telephone Encounter (Signed)
Patient does not have insurance therefore no pre cert for CMRI. Patient would be self pay.

## 2016-02-23 ENCOUNTER — Ambulatory Visit (HOSPITAL_COMMUNITY)
Admission: RE | Admit: 2016-02-23 | Discharge: 2016-02-23 | Disposition: A | Discharge status: Home / Self Care | Payer: Self-pay | Source: Ambulatory Visit | Attending: Internal Medicine | Admitting: Internal Medicine

## 2016-02-23 VITALS — BP 126/62 | HR 78 | Wt 193.0 lb

## 2016-02-23 DIAGNOSIS — J449 Chronic obstructive pulmonary disease, unspecified: Secondary | ICD-10-CM | POA: Insufficient documentation

## 2016-02-23 DIAGNOSIS — H5461 Unqualified visual loss, right eye, normal vision left eye: Secondary | ICD-10-CM | POA: Insufficient documentation

## 2016-02-23 DIAGNOSIS — Z720 Tobacco use: Secondary | ICD-10-CM

## 2016-02-23 DIAGNOSIS — I4891 Unspecified atrial fibrillation: Secondary | ICD-10-CM | POA: Insufficient documentation

## 2016-02-23 DIAGNOSIS — Z79899 Other long term (current) drug therapy: Secondary | ICD-10-CM | POA: Insufficient documentation

## 2016-02-23 DIAGNOSIS — I5022 Chronic systolic (congestive) heart failure: Secondary | ICD-10-CM | POA: Insufficient documentation

## 2016-02-23 DIAGNOSIS — H53451 Other localized visual field defect, right eye: Secondary | ICD-10-CM

## 2016-02-23 DIAGNOSIS — Z8249 Family history of ischemic heart disease and other diseases of the circulatory system: Secondary | ICD-10-CM | POA: Insufficient documentation

## 2016-02-23 DIAGNOSIS — Z87891 Personal history of nicotine dependence: Secondary | ICD-10-CM | POA: Insufficient documentation

## 2016-02-23 DIAGNOSIS — Z7982 Long term (current) use of aspirin: Secondary | ICD-10-CM | POA: Insufficient documentation

## 2016-02-23 DIAGNOSIS — Z8042 Family history of malignant neoplasm of prostate: Secondary | ICD-10-CM | POA: Insufficient documentation

## 2016-02-23 DIAGNOSIS — M1990 Unspecified osteoarthritis, unspecified site: Secondary | ICD-10-CM | POA: Insufficient documentation

## 2016-02-23 MED ORDER — AMIODARONE HCL 200 MG PO TABS: 200.0000 mg | ORAL_TABLET | Freq: Every day | ORAL | 5 refills | Status: DC

## 2016-02-23 MED ORDER — LOSARTAN POTASSIUM 25 MG PO TABS: 25.0000 mg | ORAL_TABLET | Freq: Two times a day (BID) | ORAL | 5 refills | Status: DC

## 2016-02-23 MED ORDER — POTASSIUM CHLORIDE CRYS ER 20 MEQ PO TBCR: 20.0000 meq | EXTENDED_RELEASE_TABLET | Freq: Every day | ORAL | 5 refills | Status: DC

## 2016-02-23 MED ORDER — SPIRONOLACTONE 25 MG PO TABS: 25.0000 mg | ORAL_TABLET | Freq: Every day | ORAL | 5 refills | Status: DC

## 2016-02-23 NOTE — Addendum Note (Signed)
Encounter addended by: Suezanne Cheshire, RN on: 02/23/2016  2:22 PM<BR>    Actions taken: Order list changed, Sign clinical note

## 2016-02-23 NOTE — Progress Notes (Signed)
ADVANCED HF CLINIC NOTE  Patient ID: Reginald Hahn, male   DOB: 06/02/1950, 65 y.o.   MRN: [id number] PCP: Primary Cardiologist:  HPI: Reginald Hahn is a 65 y.o. male with h/o COPD admitted in 6/17 with acute systolic HF, AF with RVR and cardiogenic shock. Presents for post-hospital f/u.   Patient was admitted 6/7 with acute systolic HF and new onset afib with RVR and EF found to be 15%.Felt to to have viral CM exacerbated by afib. Diuresed slowly so PICC placed and co-ox low. Started on IV amio and milrinone and subsequently diuresed well. Was pending cath and TEE/DC-CV but developed massive RP bleed and was transferred to ICU.   All anti-coagulants held. Received multiple transfusions. Subsequently recovered. During that time also had loss of peripheral vision in R eye. Head CT negative. Pre discharge had Myoview with EF 30-44%.  Inferior infarct vs gut attenuation. No ischemia. Refused cath.   Today he returns for HF follow up. Has moved to [facility] but comes back for appointments. Says he feels really good. Has stopped smoking. Appetite improved. No CP. Mild dyspnea with vigorous activity.  Taking all medications. He is doing light plumbing work. Had loss of peripheral vision in R eye due to presumed CVA. We recommended Neurology eval but he refused.   Myoview 6/17  There was no ST segment deviation noted during stress.  The left ventricular ejection fraction is moderately decreased (30-44%).  Findings consistent with prior myocardial infarction.  This is an intermediate risk study.  nferior/inferolateral defect (moderate) that does not improve in the rest images consistent with scar and possible soft tissue attenuation No significant ischemia.  Echo 8/17 EF 45%  ROS: All systems negative except as listed in HPI, PMH and Problem List.  Past Medical History:  Diagnosis Date  . Acute combined systolic and diastolic heart failure (HCC) 09/16/2015  . Allergy    seasonal  allergies  . Arthritis   . Tobacco use    x40 years. 1 ppd     SH:  Social History   Social History  . Marital status: Married    Spouse name: N/A  . Number of children: N/A  . Years of education: N/A   Occupational History  . Not on file.   Social History Main Topics  . Smoking status: Former Smoker    Packs/day: 1.00    Years: 40.00    Types: Cigarettes  . Smokeless tobacco: Not on file     Comment: Quit since d/c from hospital. Wears nicotene patch.  . Alcohol use No  . Drug use: No     Comment: Used to smoke marijuana  . Sexual activity: Not on file   Other Topics Concern  . 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 Heart disease Mother     died in her 83's.  . Prostate cancer Father     died in his 39's.  . Congenital heart disease Brother     died @ age 55.    Past Medical History:  Diagnosis Date  . Acute combined systolic and diastolic heart failure (HCC) 09/16/2015  . Allergy    seasonal allergies  . Arthritis   . Tobacco use    x40 years. 1 ppd  Current Outpatient Prescriptions  Medication Sig Dispense Refill  . amiodarone (PACERONE) 200 MG tablet Take 1 tablet (200 mg total) by mouth daily. 30 tablet 3  . aspirin EC 81 MG tablet Take 1 tablet (81 mg total) by mouth daily. 90 tablet 3  . losartan (COZAAR) 25 MG tablet Take 1 tablet (25 mg total) by mouth 2 (two) times daily. 60 tablet 3  . nicotine (NICODERM CQ - DOSED IN MG/24 HOURS) 21 mg/24hr patch Place 1 patch (21 mg total) onto the skin daily. 28 patch 0  . potassium chloride SA (K-DUR,KLOR-CON) 20 MEQ tablet Take 1 tablet (20 mEq total) by mouth daily. 60 tablet 3  . spironolactone (ALDACTONE) 25 MG tablet Take 1 tablet (25 mg total) by mouth daily. 30 tablet 3   No current facility-administered  medications for this encounter.     Vitals:   02/23/16 1349  BP: 126/62  Pulse: 78  SpO2: 93%  Weight: 193 lb (87.5 kg)    PHYSICAL EXAM:  General:  Well appearing. No resp difficulty HEENT: normal Neck: supple. JVP flat. Carotids 2+ bilaterally; no bruits. No lymphadenopathy or thryomegaly appreciated. Cor: PMI normal. Regular rate & rhythm. No rubs, gallops or murmurs. Lungs: clear with decreased BS throughout Abdomen: soft, nontender, nondistended. No hepatosplenomegaly. No bruits or masses. Good bowel sounds. Extremities: no cyanosis, clubbing, rash, edema Neuro: alert & orientedx3, right peripheral field cut. Moves all 4 extremities w/o difficulty. Affect pleasant.    ASSESSMENT & PLAN:  1. Chronic Systolic HF due to probable viral CM --EF 15% by echo. ~35-40% by Myoview. Likely due to tach CM in setting of rapid AF but Myoview also suggestive of CAD.  --Echo 8/17 EF 45-50% - NYHA II. Volume looks good.  - Continue losartan and spiro. Prefers not to have further titration or b-bblocker. 2. AF with RVR --Now back in NSR on amio 200 mg daily  --Refuses AC due to h/ RP bleed.  - We discussed potential switch to Tikosyn at some point down the road to avoid long-term SE of amio. He will consider it down the road.  --This patients CHA2DS2-VASc Score and unadjusted Ischemic Stroke Rate (% per year) is equal to 1-2 3. RP bleed --resolved 4. Tobacco use --congratulated on cessation 5. Abnomal Myoview --fixed inferior defect. May be gut attenuation.  Discussed outpatient LHC however he would like to hold off.  -ECASA 81 started. Refuses statin.  6. Loss of R peripheral vision --concerning for embolic CVA in setting of AF --Suggested referral to Neuro but he declines. Also declines MRI and carotid dopplers.    Arvilla MeresBensimhon, Leane Loring MD 2:06 PM

## 2016-02-23 NOTE — Patient Instructions (Signed)
Follow up in 6 months 

## 2016-03-20 ENCOUNTER — Telehealth (HOSPITAL_COMMUNITY): Payer: Self-pay | Admitting: *Deleted

## 2016-03-20 NOTE — Telephone Encounter (Signed)
Marylu Lund (patient's ex-wife/caregiver) called saying patient has been having increased labored breathing and coughing at home, especially when laying flat.  Patient also said he has gained about 10 lbs in the last month. Discussed symptoms with Otilio Saber, PA and he advises him to come in tomorrow or Wednesday.  Patient agreed to come in this Wednesday at 3:00.  Patient advised to go to the closest emergency room if worsening symptoms occur prior to his appointment.

## 2016-03-22 ENCOUNTER — Ambulatory Visit (HOSPITAL_COMMUNITY)
Admission: RE | Admit: 2016-03-22 | Discharge: 2016-03-22 | Disposition: A | Discharge status: Home / Self Care | Payer: Self-pay | Source: Ambulatory Visit | Attending: Internal Medicine | Admitting: Internal Medicine

## 2016-03-22 ENCOUNTER — Encounter (HOSPITAL_COMMUNITY): Payer: Self-pay

## 2016-03-22 VITALS — BP 141/84 | HR 86 | Resp 20 | Wt 199.0 lb

## 2016-03-22 DIAGNOSIS — I4891 Unspecified atrial fibrillation: Secondary | ICD-10-CM | POA: Insufficient documentation

## 2016-03-22 DIAGNOSIS — Z8042 Family history of malignant neoplasm of prostate: Secondary | ICD-10-CM | POA: Insufficient documentation

## 2016-03-22 DIAGNOSIS — H5461 Unqualified visual loss, right eye, normal vision left eye: Secondary | ICD-10-CM | POA: Insufficient documentation

## 2016-03-22 DIAGNOSIS — Z87891 Personal history of nicotine dependence: Secondary | ICD-10-CM | POA: Insufficient documentation

## 2016-03-22 DIAGNOSIS — Z8249 Family history of ischemic heart disease and other diseases of the circulatory system: Secondary | ICD-10-CM | POA: Insufficient documentation

## 2016-03-22 DIAGNOSIS — J449 Chronic obstructive pulmonary disease, unspecified: Secondary | ICD-10-CM | POA: Insufficient documentation

## 2016-03-22 DIAGNOSIS — R58 Hemorrhage, not elsewhere classified: Secondary | ICD-10-CM

## 2016-03-22 DIAGNOSIS — Z720 Tobacco use: Secondary | ICD-10-CM

## 2016-03-22 DIAGNOSIS — I5041 Acute combined systolic (congestive) and diastolic (congestive) heart failure: Secondary | ICD-10-CM

## 2016-03-22 DIAGNOSIS — R9439 Abnormal result of other cardiovascular function study: Secondary | ICD-10-CM | POA: Insufficient documentation

## 2016-03-22 DIAGNOSIS — Z79899 Other long term (current) drug therapy: Secondary | ICD-10-CM | POA: Insufficient documentation

## 2016-03-22 DIAGNOSIS — I5023 Acute on chronic systolic (congestive) heart failure: Secondary | ICD-10-CM | POA: Insufficient documentation

## 2016-03-22 DIAGNOSIS — I5022 Chronic systolic (congestive) heart failure: Secondary | ICD-10-CM | POA: Insufficient documentation

## 2016-03-22 DIAGNOSIS — I480 Paroxysmal atrial fibrillation: Secondary | ICD-10-CM

## 2016-03-22 LAB — CBC
HCT: 45.3 % (ref 39.0–52.0)
Hemoglobin: 15.1 g/dL (ref 13.0–17.0)
MCH: 31.5 pg (ref 26.0–34.0)
MCHC: 33.3 g/dL (ref 30.0–36.0)
MCV: 94.6 fL (ref 78.0–100.0)
PLATELETS: 224 10*3/uL (ref 150–400)
RBC: 4.79 MIL/uL (ref 4.22–5.81)
RDW: 12.7 % (ref 11.5–15.5)
WBC: 11.7 10*3/uL — AB (ref 4.0–10.5)

## 2016-03-22 LAB — BRAIN NATRIURETIC PEPTIDE: B Natriuretic Peptide: 34.3 pg/mL (ref 0.0–100.0)

## 2016-03-22 LAB — BASIC METABOLIC PANEL
ANION GAP: 10 (ref 5–15)
BUN: 10 mg/dL (ref 6–20)
CALCIUM: 9.2 mg/dL (ref 8.9–10.3)
CO2: 22 mmol/L (ref 22–32)
CREATININE: 0.99 mg/dL (ref 0.61–1.24)
Chloride: 105 mmol/L (ref 101–111)
GLUCOSE: 86 mg/dL (ref 65–99)
Potassium: 4.6 mmol/L (ref 3.5–5.1)
Sodium: 137 mmol/L (ref 135–145)

## 2016-03-22 MED ORDER — FUROSEMIDE 20 MG PO TABS: 20.0000 mg | ORAL_TABLET | ORAL | 0 refills | Status: DC | PRN

## 2016-03-22 NOTE — Progress Notes (Signed)
    ReDS Vest - 03/22/16 1500      ReDS Vest   MR  No   Fitting Posture Sitting   Height Marker Tall   Ruler Value 11   Center Strip Aligned   ReDS Value 28

## 2016-03-22 NOTE — Progress Notes (Signed)
Advanced Heart Failure Clinic Note   Patient ID: Fabiola BackerRichard Jantz, male   DOB: 10/13/1950, 65 y.o.   MRN: [id number] PCP: Primary Cardiologist:  HPI: Mr.Abimelec Vear Clockhillips is a 65 y.o. male with h/o COPD admitted in 6/17 with acute systolic HF, AF with RVR and cardiogenic shock. Presents for post-hospital f/u.   Patient was admitted 6/7 with acute systolic HF and new onset afib with RVR and EF found to be 15%.Felt to to have viral CM exacerbated by afib. Diuresed slowly so PICC placed and co-ox low. Started on IV amio and milrinone and subsequently diuresed well. Was pending cath and TEE/DC-CV but developed massive RP bleed and was transferred to ICU.   All anti-coagulants held. Received multiple transfusions. Subsequently recovered. During that time also had loss of peripheral vision in R eye. Head CT negative. Pre discharge had Myoview with EF 30-44%.  Inferior infarct vs gut attenuation. No ischemia. Refused cath.   He presents today for add on for weight gain. Weight at home 188 this am, up from 180 last month. Has gained ~ 30 lbs since in the hospital in June. This has been gradual. He also stopped smoking around this time. Living in [place] and eating heavy, rich foods often. Eats croissants most mornings with his coffee, for example. orthopnea. + Bendopnea. Hasn't swelling in legs. Can walk 200-300 feet prior to getting SOB. Doesn't have lasix at home. Not working currently. States he is taking all of his medications as directed.   Myoview 6/17  There was no ST segment deviation noted during stress.  The left ventricular ejection fraction is moderately decreased (30-44%).  Findings consistent with prior myocardial infarction.  This is an intermediate risk study.  nferior/inferolateral defect (moderate) that does not improve in the rest images consistent with scar and possible soft tissue attenuation No significant ischemia.  Echo 8/17 EF 45%  ROS: All systems negative except as  listed in HPI, PMH and Problem List.  Past Medical History:  Diagnosis Date  . Acute combined systolic and diastolic heart failure (HCC) 09/16/2015  . Allergy    seasonal allergies  . Arthritis   . Tobacco use    x40 years. 1 ppd     SH:  Social History   Social History  . Marital status: Married    Spouse name: N/A  . Number of children: N/A  . Years of education: N/A   Occupational History  . Not on file.   Social History Main Topics  . Smoking status: Former Smoker    Packs/day: 1.00    Years: 40.00    Types: Cigarettes  . Smokeless tobacco: Not on file     Comment: Quit since d/c from hospital. Wears nicotene patch.  . Alcohol use No  . Drug use: No     Comment: Used to smoke marijuana  . Sexual activity: Not on file   Other Topics Concern  . Not on file   Social History Narrative   Lives alone   2 daughters, ex[place] in 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 Heart disease Mother     died in her 2680's.  . Prostate cancer Father     died in his 7670's.  . Congenital heart disease Brother     died @ age 713.    Past Medical History:  Diagnosis Date  . Acute  combined systolic and diastolic heart failure (HCC) 09/16/2015  . Allergy    seasonal allergies  . Arthritis   . Tobacco use    x40 years. 1 ppd    Current Outpatient Prescriptions  Medication Sig Dispense Refill  . amiodarone (PACERONE) 200 MG tablet Take 1 tablet (200 mg total) by mouth daily. 30 tablet 5  . losartan (COZAAR) 25 MG tablet Take 1 tablet (25 mg total) by mouth 2 (two) times daily. 60 tablet 5  . potassium chloride SA (K-DUR,KLOR-CON) 20 MEQ tablet Take 1 tablet (20 mEq total) by mouth daily. 30 tablet 5  . spironolactone (ALDACTONE) 25 MG tablet Take 1 tablet (25 mg total) by mouth daily. 30 tablet 5   No current facility-administered medications for this encounter.      Vitals:   03/22/16 1526  BP: (!) 141/84  Pulse: 86  Resp: 20  SpO2: 92%  Weight: 199 lb (90.3 kg)   Wt Readings from Last 3 Encounters:  03/22/16 199 lb (90.3 kg)  02/23/16 193 lb (87.5 kg)  12/01/15 171 lb (77.6 kg)     PHYSICAL EXAM: General:  Well appearing. NAD.  HEENT: Normal Neck: supple. JVP does not appear elevated. Carotids 2+ bilaterally; no bruits. No thyromegaly or nodule noted.  Cor: PMI normal. RRR. No M/G/R  Lungs: CTAB, normal effort.  Abdomen: soft, NT, ND, no HSM. No bruits or masses. +BS  Extremities: no cyanosis, clubbing, rash, Trace ankle edema at most.  Neuro: alert & orientedx3, right peripheral field cut. Moves all 4 extremities w/o difficulty. Affect pleasant.    ASSESSMENT & PLAN:  1. Chronic Systolic HF due to probable viral CM --EF 15% by echo. ~35-40% by Myoview. Likely due to tach CM in setting of rapid AF but Myoview also suggestive of CAD.  --Echo 8/17 EF 45-50% - NYHA II-III. Volume status looks OK on exam. ReDS Vest 28%.   - Continue losartan 25 mg BID and spiro 25 mg daily.  - Pt declines BB or any up-titration - Remains on Potassium 20 meq daily despite not being on diuretics.  Will check BMET today.  - Will give him lasix 40 mg to use AS needed. Have asked him not to use more than 1-2 times weekly without calling HF clinic 2. AF with RVR - NSR on exam on amio 200 mg daily. - Refuses AC due to h/ RP bleed.  - Have previously discussed potential switch to Tikosyn at some point down the road to avoid long-term SE of amio. He defers for now.   - This patients CHA2DS2-VASc Score and unadjusted Ischemic Stroke Rate (% per year) is equal to 1-2 3. RP bleed - Resolved 4. Tobacco use - Remains off.  5. Abnormal Myoview --fixed inferior defect. May be gut attenuation.  - Declines any further work up at this time.  -ECASA 81 started. Refuses statin.  6. Loss of R peripheral vision - Concerning for embolic CVA in setting of AF -  Suggested referral to Neuro but he declines. Also declines MRI and carotid dopplers.   - Revisited today. Pt declines any further work up.   Overall pt looks OK.  Will give him 40 mg of lasix to use AS NEEDED for weight gain of 3 lbs overnight or 5 lbs within a week. Think most of his weight is true weight gain and not fluid. Encouraged healthy eating and limiting portion sizes.   He declines any further work up or medications changes at this time.  Said he will discuss further with Dr. Gala Romney at future visit. Plan RTC 3 months  Graciella Freer, PA-C 3:33 PM  Total time spent > 25 minutes. Over half that spent discussing the above.

## 2016-03-22 NOTE — Patient Instructions (Signed)
Labs today We will only contact you if something comes back abnormal or we need to make some changes. Otherwise no news is good news!   Start Lasix 40 mg, one tab as needed for 3 lb weight gain overnight or 5 lb weight gain in a week.  Your physician recommends that you schedule a follow-up appointment in: 2-3 months with Dr Gala Romney   Do the following things EVERYDAY: 1) Weigh yourself in the morning before breakfast. Write it down and keep it in a log. 2) Take your medicines as prescribed 3) Eat low salt foods-Limit salt (sodium) to 2000 mg per day.  4) Stay as active as you can everyday 5) Limit all fluids for the day to less than 2 liters

## 2016-05-13 DIAGNOSIS — R9431 Abnormal electrocardiogram [ECG] [EKG]: Secondary | ICD-10-CM | POA: Diagnosis not present

## 2016-05-13 DIAGNOSIS — I509 Heart failure, unspecified: Secondary | ICD-10-CM | POA: Diagnosis not present

## 2016-05-13 DIAGNOSIS — J209 Acute bronchitis, unspecified: Secondary | ICD-10-CM | POA: Diagnosis not present

## 2016-05-15 ENCOUNTER — Telehealth (HOSPITAL_COMMUNITY): Payer: Self-pay | Admitting: *Deleted

## 2016-05-15 NOTE — Telephone Encounter (Signed)
Patient called in asking to for an appointment as soon as possible to be seen with DB.  York Spaniel he has been coughing for a few days and went to an urgent medical center this past Saturday.  He was negative for the flu but EKG was abnormal and they suggested he needs to be seen.  They also advised him to go to the emergency room but he refuses to go.    I was able to fit him in on the APP clinic tomorrow.

## 2016-05-16 ENCOUNTER — Ambulatory Visit (HOSPITAL_COMMUNITY)
Admission: RE | Admit: 2016-05-16 | Discharge: 2016-05-16 | Disposition: A | Discharge status: Home / Self Care | Payer: Medicare Other | Source: Ambulatory Visit | Attending: Internal Medicine | Admitting: Internal Medicine

## 2016-05-16 VITALS — BP 162/88 | HR 76 | Ht 72.0 in | Wt 201.2 lb

## 2016-05-16 DIAGNOSIS — Z720 Tobacco use: Secondary | ICD-10-CM | POA: Diagnosis not present

## 2016-05-16 DIAGNOSIS — Z8042 Family history of malignant neoplasm of prostate: Secondary | ICD-10-CM | POA: Diagnosis not present

## 2016-05-16 DIAGNOSIS — I110 Hypertensive heart disease with heart failure: Secondary | ICD-10-CM | POA: Insufficient documentation

## 2016-05-16 DIAGNOSIS — M1990 Unspecified osteoarthritis, unspecified site: Secondary | ICD-10-CM | POA: Insufficient documentation

## 2016-05-16 DIAGNOSIS — R58 Hemorrhage, not elsewhere classified: Secondary | ICD-10-CM | POA: Diagnosis not present

## 2016-05-16 DIAGNOSIS — Z79899 Other long term (current) drug therapy: Secondary | ICD-10-CM | POA: Diagnosis not present

## 2016-05-16 DIAGNOSIS — I4891 Unspecified atrial fibrillation: Secondary | ICD-10-CM | POA: Diagnosis not present

## 2016-05-16 DIAGNOSIS — H5461 Unqualified visual loss, right eye, normal vision left eye: Secondary | ICD-10-CM | POA: Diagnosis not present

## 2016-05-16 DIAGNOSIS — Z8249 Family history of ischemic heart disease and other diseases of the circulatory system: Secondary | ICD-10-CM | POA: Insufficient documentation

## 2016-05-16 DIAGNOSIS — I5022 Chronic systolic (congestive) heart failure: Secondary | ICD-10-CM | POA: Diagnosis not present

## 2016-05-16 DIAGNOSIS — I447 Left bundle-branch block, unspecified: Secondary | ICD-10-CM | POA: Diagnosis not present

## 2016-05-16 DIAGNOSIS — Z87891 Personal history of nicotine dependence: Secondary | ICD-10-CM | POA: Diagnosis not present

## 2016-05-16 DIAGNOSIS — I480 Paroxysmal atrial fibrillation: Secondary | ICD-10-CM | POA: Diagnosis not present

## 2016-05-16 DIAGNOSIS — I10 Essential (primary) hypertension: Secondary | ICD-10-CM

## 2016-05-16 LAB — COMPREHENSIVE METABOLIC PANEL
ALT: 22 U/L (ref 17–63)
ANION GAP: 8 (ref 5–15)
AST: 23 U/L (ref 15–41)
Albumin: 4.1 g/dL (ref 3.5–5.0)
Alkaline Phosphatase: 69 U/L (ref 38–126)
BUN: 12 mg/dL (ref 6–20)
CALCIUM: 9.3 mg/dL (ref 8.9–10.3)
CO2: 25 mmol/L (ref 22–32)
Chloride: 103 mmol/L (ref 101–111)
Creatinine, Ser: 1.05 mg/dL (ref 0.61–1.24)
GFR calc Af Amer: >60 mL/min (ref >60)
Glucose, Bld: 94 mg/dL (ref 65–99)
POTASSIUM: 4.4 mmol/L (ref 3.5–5.1)
Sodium: 136 mmol/L (ref 135–145)
TOTAL PROTEIN: 6.8 g/dL (ref 6.5–8.1)
Total Bilirubin: 0.8 mg/dL (ref 0.3–1.2)

## 2016-05-16 LAB — TSH: TSH: 1.517 u[IU]/mL (ref 0.350–4.500)

## 2016-05-16 MED ORDER — LOSARTAN POTASSIUM 50 MG PO TABS: 50.0000 mg | ORAL_TABLET | Freq: Every day | ORAL | 6 refills | Status: DC

## 2016-05-16 NOTE — Patient Instructions (Addendum)
Routine lab work today. Will notify you of abnormal results, otherwise no news is good news!  INCREASE Losartan to 50 mg once daily. Can "double up" on your current 25 mg tablets at home (Take 2 tabs once daily). New Rx for 50 mg tablets has been sent to your pharmacy (Take 1 tablet once daily).  Will schedule you for an echocardiogram at [facility]. Address: [address] ([address] Floor), [place], [place] [address]  Phone: [phone]  Follow up with Dr. Gala Romney in 3 months.  Do the following things EVERYDAY: 1) Weigh yourself in the morning before breakfast. Write it down and keep it in a log. 2) Take your medicines as prescribed 3) Eat low salt foods-Limit salt (sodium) to 2000 mg per day.  4) Stay as active as you can everyday 5) Limit all fluids for the day to less than 2 liters

## 2016-05-16 NOTE — Progress Notes (Signed)
Advanced Heart Failure Clinic Note   Patient ID: Reginald Hahn, male   DOB: May 13, 1950, 66 y.o.   MRN: [id number] Primary Cardiologist: Dr. Gala Romney   HPI: [name] is a 66 y.o. male with h/o COPD admitted in 6/17 with acute systolic HF, AF with RVR and cardiogenic shock. Presents for post-hospital f/u.   Patient was admitted 6/7 with acute systolic HF and new onset afib with RVR and EF found to be 15%.Felt to to have viral CM exacerbated by afib. Diuresed slowly so PICC placed and co-ox low. Started on IV amio and milrinone and subsequently diuresed well. Was pending cath and TEE/DC-CV but developed massive RP bleed and was transferred to ICU.   All anti-coagulants held. Received multiple transfusions. Subsequently recovered. During that time also had loss of peripheral vision in R eye. Head CT negative. Pre discharge had Myoview with EF 30-44%.  Inferior infarct vs gut attenuation. No ischemia. Refused cath.   He presents today for add on for abnormal EKG.   Was seen in Urgent care for a cough and they suggested he go to the ED.  He refused due to high flu census.  He thinks they said something about a "clot" but thinks they were talking about his heart arteries.  No DOE, chest pain, orthopnea, or BND.  Does have some chest tenderness from coughing.  Finished doxycycline without relief and is now on cefdinir. Denies swelling.  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  Was short of breath with first job, lifting a toilet, but had no problems the rest of the day.   Myoview 6/17  There was no ST segment deviation noted during stress.  The left ventricular ejection fraction is moderately decreased (30-44%).  Findings consistent with prior myocardial infarction.  This is an intermediate risk study.  nferior/inferolateral defect (moderate) that does not improve in the rest images consistent with scar and possible soft tissue attenuation. No significant ischemia.  Echo 8/17 EF 45%  ROS:  All systems negative except as listed in HPI, PMH and Problem List.  Past Medical History:  Diagnosis Date  . Acute combined systolic and diastolic heart failure (HCC) 09/16/2015  . Allergy    seasonal allergies  . Arthritis   . Tobacco use    x40 years. 1 ppd     SH:  Social History   Social History  . Marital status: Married    Spouse name: N/A  . Number of children: N/A  . Years of education: N/A   Occupational History  . Not on file.   Social History Main Topics  . Smoking status: Former Smoker    Packs/day: 1.00    Years: 40.00    Types: Cigarettes  . Smokeless tobacco: Not on file     Comment: Quit since d/c from hospital. Wears nicotene patch.  . Alcohol use No  . Drug use: No     Comment: Used to smoke marijuana  . Sexual activity: Not on file   Other Topics Concern  . 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 Heart disease Mother     died in her 60's.  . Prostate cancer Father     died in his 73's.  . Congenital heart disease Brother  died @ age 78.    Past Medical History:  Diagnosis Date  . Acute combined systolic and diastolic heart failure (HCC) 09/16/2015  . Allergy    seasonal allergies  . Arthritis   . Tobacco use    x40 years. 1 ppd    Current Outpatient Prescriptions  Medication Sig Dispense Refill  . amiodarone (PACERONE) 200 MG tablet Take 1 tablet (200 mg total) by mouth daily. 30 tablet 5  . benzonatate (TESSALON) 100 MG capsule Take by mouth 3 (three) times daily as needed for cough.    . losartan (COZAAR) 25 MG tablet Take 25 mg by mouth daily.    . potassium chloride SA (K-DUR,KLOR-CON) 20 MEQ tablet Take 1 tablet (20 mEq total) by mouth daily. 30 tablet 5  . spironolactone (ALDACTONE) 25 MG tablet Take 1 tablet (25 mg total) by mouth daily.  30 tablet 5  . furosemide (LASIX) 20 MG tablet Take 1 tablet (20 mg total) by mouth as needed. (Patient not taking: Reported on 05/16/2016) 30 tablet 0   No current facility-administered medications for this encounter.     Vitals:   05/16/16 1007  BP: (!) 162/88  Pulse: 76  SpO2: 93%  Weight: 201 lb 3.2 oz (91.3 kg)  Height: 6' (1.829 m)   Wt Readings from Last 3 Encounters:  05/16/16 201 lb 3.2 oz (91.3 kg)  03/22/16 199 lb (90.3 kg)  02/23/16 193 lb (87.5 kg)     PHYSICAL EXAM: General:  Well appearing. NAD.  HEENT: Normal Neck: Supple. JVP 6-7 cm. Carotids 2+ bilaterally; no bruits. No thyromegaly or nodule noted.  Cor: PMI normal. Regular. No M/G/R noted.   Lungs: Clear, normal effort.  Abdomen: soft, NT, ND, no HSM. No bruits or masses. +BS  Extremities: no cyanosis, clubbing, rash, No peripheral edema.   Neuro: alert & orientedx3, right peripheral field cut. Moves all 4 extremities w/o difficulty. Affect pleasant.   ASSESSMENT & PLAN:  1. 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 ~35-40% by Myoview. Likely due to tach CM in setting of rapid AF but Myoview also suggestive of CAD.  --Echo 8/17 EF 45-50% - NYHA II-III. Volume status stable on exam.  - Increase losartan 50 mg daily.   - Continue spiro 25 mg daily.  - Pt declines BB or any up-titration - Takes Potassium 20 meq daily and lasix only as needed. CMET today.  - Continue lasix 40 mg as needed.  2. AF with RVR - Remains in NSR by EKG on 200 mg amiodarone daily.  - Has refused AC due to h/o RP bleed.  - Have previously discussed potential switch to Tikosyn at some point down the road to avoid long-term SE of amio. He defers for now.   - This patients CHA2DS2-VASc Score and unadjusted Ischemic Stroke Rate (% per year) is equal to 1-2 3. RP bleed - Resolved. No further.  4. Tobacco use - Not smoking.   5. Abnormal Myoview 09/2015 --fixed inferior defect. May be gut attenuation.  -  Declines any further work up at this time. Will repeat Echo this week.  If EF down, may need catheterization.  -ECASA 81 started. Refuses statin.  6. Loss of R peripheral vision - Concerning for embolic CVA in setting of AF - Have previously suggested referral to Neuro but he has declined. Also declines MRI and carotid dopplers.   7. HTN - Elevated in clinic today.  - Increase losartan 50 mg  daily.  8. LBBB - New by recent EKG and again today.  - Repeat Echo. If EF down, will need ischemic work up.   Meds and labs as above.  Repeat Echo. If EF down will need ischemic work up. Follow up 3 months. Sooner with symptoms.   Graciella Freer, PA-C 10:21 AM   Patient seen and examined with Otilio Saber, PA-C. We discussed all aspects of the encounter. I agree with the assessment and plan as stated above.   Overall doing well from a functional standpoint. Referred back due to new LBBB. He is asymptomatic from cardiac standpoint. Will get echo to further evaluate. If EF down I have strongly suggested cath (as before).. If EF normal< I am ok with watchful waiting and aggressive RF management.   Cybill Uriegas,MD 11:40 PM

## 2016-05-17 ENCOUNTER — Other Ambulatory Visit: Payer: Self-pay

## 2016-05-17 ENCOUNTER — Ambulatory Visit (HOSPITAL_COMMUNITY): Payer: Medicare Other | Attending: Internal Medicine

## 2016-05-17 DIAGNOSIS — J449 Chronic obstructive pulmonary disease, unspecified: Secondary | ICD-10-CM | POA: Insufficient documentation

## 2016-05-17 DIAGNOSIS — I447 Left bundle-branch block, unspecified: Secondary | ICD-10-CM | POA: Diagnosis not present

## 2016-05-17 DIAGNOSIS — I5022 Chronic systolic (congestive) heart failure: Secondary | ICD-10-CM | POA: Insufficient documentation

## 2016-05-17 DIAGNOSIS — I071 Rheumatic tricuspid insufficiency: Secondary | ICD-10-CM | POA: Diagnosis not present

## 2016-05-17 DIAGNOSIS — I4891 Unspecified atrial fibrillation: Secondary | ICD-10-CM | POA: Diagnosis not present

## 2016-05-17 LAB — T3, FREE: T3, Free: 3.2 pg/mL (ref 2.0–4.4)

## 2016-05-23 ENCOUNTER — Other Ambulatory Visit (HOSPITAL_COMMUNITY): Payer: Self-pay | Admitting: Cardiology

## 2016-05-23 ENCOUNTER — Other Ambulatory Visit (HOSPITAL_COMMUNITY): Payer: Self-pay | Admitting: Internal Medicine

## 2016-05-23 MED ORDER — AMIODARONE HCL 200 MG PO TABS: 200.0000 mg | ORAL_TABLET | Freq: Every day | ORAL | 3 refills | Status: DC

## 2016-06-12 ENCOUNTER — Ambulatory Visit (INDEPENDENT_AMBULATORY_CARE_PROVIDER_SITE_OTHER): Payer: Medicare Other | Admitting: Pulmonary Disease

## 2016-06-12 ENCOUNTER — Encounter: Payer: Self-pay | Admitting: Pulmonary Disease

## 2016-06-12 ENCOUNTER — Telehealth: Payer: Self-pay | Admitting: Pulmonary Disease

## 2016-06-12 ENCOUNTER — Other Ambulatory Visit (INDEPENDENT_AMBULATORY_CARE_PROVIDER_SITE_OTHER): Payer: Medicare Other

## 2016-06-12 ENCOUNTER — Ambulatory Visit (INDEPENDENT_AMBULATORY_CARE_PROVIDER_SITE_OTHER)
Admission: RE | Admit: 2016-06-12 | Discharge: 2016-06-12 | Disposition: A | Discharge status: Home / Self Care | Payer: Medicare Other | Source: Ambulatory Visit | Attending: Pulmonary Disease | Admitting: Pulmonary Disease

## 2016-06-12 DIAGNOSIS — R059 Cough, unspecified: Secondary | ICD-10-CM

## 2016-06-12 DIAGNOSIS — R05 Cough: Secondary | ICD-10-CM | POA: Diagnosis not present

## 2016-06-12 DIAGNOSIS — J309 Allergic rhinitis, unspecified: Secondary | ICD-10-CM | POA: Diagnosis not present

## 2016-06-12 DIAGNOSIS — R0602 Shortness of breath: Secondary | ICD-10-CM | POA: Diagnosis not present

## 2016-06-12 LAB — CBC WITH DIFFERENTIAL/PLATELET
Basophils Absolute: 0.1 10*3/uL (ref 0.0–0.1)
Basophils Relative: 1.2 % (ref 0.0–3.0)
Eosinophils Absolute: 0.4 10*3/uL (ref 0.0–0.7)
Eosinophils Relative: 4.1 % (ref 0.0–5.0)
HCT: 46.4 % (ref 39.0–52.0)
Hemoglobin: 15.8 g/dL (ref 13.0–17.0)
Lymphocytes Relative: 15.4 % (ref 12.0–46.0)
Lymphs Abs: 1.7 10*3/uL (ref 0.7–4.0)
MCHC: 34.1 g/dL (ref 30.0–36.0)
MCV: 92.7 fl (ref 78.0–100.0)
Monocytes Absolute: 0.9 10*3/uL (ref 0.1–1.0)
Monocytes Relative: 8.2 % (ref 3.0–12.0)
Neutro Abs: 7.8 10*3/uL — ABNORMAL HIGH (ref 1.4–7.7)
Neutrophils Relative %: 71.1 % (ref 43.0–77.0)
Platelets: 233 10*3/uL (ref 150.0–400.0)
RBC: 5.01 Mil/uL (ref 4.22–5.81)
RDW: 12.9 % (ref 11.5–15.5)
WBC: 10.9 10*3/uL — ABNORMAL HIGH (ref 4.0–10.5)

## 2016-06-12 NOTE — Progress Notes (Signed)
Subjective:    Patient ID: Reginald Hahn, male    DOB: 11-24-1950, 66 y.o.   MRN: [id number]  HPI Patient and wife reports that starting in June 2017 he began treatment for congestive heart failure with Dr. Gala Romney. In December he was treated for a strong cough with 2 courses of antibiotics and no significant improvement. He denies any associated respiratory illness. The cough persisted and in February was treated for potential allergies with Flonase and Zyrtec as well as additional antibiotics with mild improvement. He has noticed that when he was off his Zyrtec for a couple of days his cough was more severe. Last week the patient reports he coughs so hard that he syncopized. Cough seems to be more severe when laying down and he always has the sensation that "mucus is stuck in his throat". He reports his cough is productive of a "translucent" phlegm. Denies any hemoptysis. He has been on Amiodarone since July and is planned to switch to a different medication in the next couple of months. He does cough and wake himself up intermittently at night. He does wheeze intermittently. He reports dyspnea only after coughing spells and with significant exertion. He denies any chest tightness, pressure, or pain. He reports his usual seasonal sinus congestion & drainage. Denies any reflux or dyspepsia. Doesn't have any genuine morning brash water taste. No dysphagia or odynophagia. He reports no breathing problems or allergies as a child. He denies any history of bronchitis. He does feel his voice gets hoarse at times. He was prescribed an inhaler when his cough first started but doesn't recall any improvement in his cough.   Review of Systems No rashes or bruising. No fever, chills, or sweats. A pertinent 14 point review of systems is negative except as per the history of presenting illness.  Allergies  Allergen Reactions  . Codeine Itching    whelps  . Other     Seasonal allergies, "all spices, salt,  pepper"    Current Outpatient Prescriptions on File Prior to Visit  Medication Sig Dispense Refill  . amiodarone (PACERONE) 200 MG tablet Take 1 tablet (200 mg total) by mouth daily. 90 tablet 3  . benzonatate (TESSALON) 100 MG capsule Take by mouth 3 (three) times daily as needed for cough.    . losartan (COZAAR) 50 MG tablet Take 1 tablet (50 mg total) by mouth daily. (Patient taking differently: Take 25 mg by mouth daily. ) 30 tablet 6  . potassium chloride SA (K-DUR,KLOR-CON) 20 MEQ tablet Take 1 tablet (20 mEq total) by mouth daily. 30 tablet 5  . spironolactone (ALDACTONE) 25 MG tablet Take 1 tablet (25 mg total) by mouth daily. 30 tablet 5  . amiodarone (PACERONE) 200 MG tablet Take 1 tablet (200 mg total) by mouth daily. (Patient not taking: Reported on 06/12/2016) 30 tablet 5  . furosemide (LASIX) 20 MG tablet Take 1 tablet (20 mg total) by mouth as needed. (Patient not taking: Reported on 05/16/2016) 30 tablet 0   No current facility-administered medications on file prior to visit.     Past Medical History:  Diagnosis Date  . Acute combined systolic and diastolic heart failure (HCC) 09/16/2015  . Allergic rhinitis   . Allergy    seasonal allergies  . Arthritis   . Black widow spider bite   . Stroke (cerebrum) (HCC)   . Tobacco use    x40 years. 1 ppd    Past Surgical History:  Procedure Laterality Date  . APPENDECTOMY  Mid 1960s [childhood]  . SMALL INTESTINE SURGERY     as a child from swallowing bubble gum  . TONSILLECTOMY    . VASECTOMY      Family History  Problem Relation Age of Onset  . Heart disease Mother     died in her 35's.  . Diabetes Mother   . Prostate cancer Father     died in his 65's.  . Congenital heart disease Brother     died @ age 9.  . Lung disease Neg Hx     Social History   Social History  . Marital status: Married    Spouse name: N/A  . Number of children: N/A  . Years of education: N/A   Social History Main Topics  .  Smoking status: Former Smoker    Packs/day: 2.00    Years: 50.00    Types: Cigarettes    Start date: 04/14/1963    Quit date: 10/09/2015  . Smokeless tobacco: Never Used     Comment: stopped for a couple of years in his 30s  . Alcohol use No  . Drug use: No     Comment: Used to smoke marijuana  . Sexual activity: Not Asked   Other Topics Concern  . 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 No pets currently. Remote exposure to a parrot for 2 years.       Objective:   Physical Exam BP 130/74 (BP Location: Right Arm, Patient Position: Sitting, Cuff Size: Large)   Pulse 81   Ht 6' (1.829 m)   Wt 200 lb 3.2 oz (90.8 kg)   SpO2 92%   BMI 27.15 kg/m   General:  Awake. Alert. No acute distress. Thin Caucasian male. Integument:  Warm & dry. No rash on exposed skin. No bruising on exposed skin. Extremities:  No cyanosis or clubbing.  Lymphatics:  No appreciated cervical or supraclavicular lymphadenoapthy. HEENT:  Moist mucus membranes. No oral ulcers. No scleral injection or icterus. Minimal nasal turbinate swelling. Cardiovascular:  Regular rate. No edema. No appreciable JVD.  Pulmonary:  Faint bilateral basilar crackles. Symmetric chest wall expansion. No accessory muscle use on room air. Abdomen: Soft. Normal bowel sounds. Nondistended. Grossly nontender. Musculoskeletal:  Normal bulk and tone. Hand grip strength 5/5 bilaterally. No joint deformity or effusion appreciated. Neurological:  CN 2-12 grossly in tact. No meningismus. Moving all 4 extremities equally. Symmetric brachioradialis deep tendon reflexes. Psychiatric:  Mood and affect congruent. Speech normal rhythm, rate & tone.   IMAGING CTA CHEST 09/16/15 (personally  reviewed by me): Apical predominant emphysematous changes. Linear opacity right lung base suggestive of scar tissue or atelectasis adjacent small to moderate right-sided pleural effusion. No other rectal mass or opacity appreciated. Small left pleural effusion. Borderline enlarged mediastinal and hilar lymph nodes largest 1.1 cm by my measurement. No pulmonary emboli.  CARDIAC TTE (05/17/16): Moderate LVH with normal cavity size. EF 45-50% & grade 1 diastolic dysfunction. In coordinate septal motion. LA & RA normal in size. RV normal in size and function. No aortic stenosis or regurgitation. Aortic root normal in size. Trivial mitral regurgitation. Aneurysmal interatrial septum but cannot exclude PFO. No significant pulmonic regurgitation. Mild tricuspid regurgitation.  No pericardial effusion.  MICROBIOLOGY 09/17/15 Hepatitis A IgM: Negative Hepatitis B surface antigen: Negative Hepatitis B core IgM: Negative Hepatitis C viral antibody: <0.1 HIV: Nonreactive   LABS 03/22/16 CBC: 11.7/15.1/45.3/224 BNP: 34.3  09/15/15 UDS:  Positive for benzodiazepines & THC     Assessment & Plan:  66 y.o. male with ongoing cough that is of unclear etiology. Certainly he could be experiencing postnasal drainage from allergic rhinitis that could be contributing to his cough. Alternatively, with his history of asbestos exposure this is also a possible etiology for his cough. His previous lower lung zones were obscured on CT imaging bilateral pleural effusions and repeat chest x-ray imaging today to better assess his underlying pulmonary parenchyma. He has no symptoms of reflux that could contribute to his cough but is having intermittent changes in the quality of his voice. I instructed the patient contact my office if he had any new breathing problems and M holding off on initiating any new medications at this time given limited medical improvement experienced previously.  1. Cough: Suspect evolving COPD versus  postnasal drainage. Continuing current Flonase and Zyrtec. Holding off on additional inhalers at this time. Checking full pulmonary function testing as well as 6 minute walk test on room air before next appointment. Checking chest x-ray PA/LAT today. 2. Allergic rhinitis: Checking maxillofacial CT scan without contrast. Checking CBC with differential and RAST panel today. 3. Voice changes: Consider ENT referral depending upon findings on CT scan and pulmonary testing. 4. Tobacco use disorder: Previously quit. Does have a significant history of tobacco use. Consider lung cancer screening after next appointment depending upon x-ray result. 5. Follow-up: Patient to return to clinic in 2 weeks or sooner if needed.  Donna Christen Jamison Neighbor, M.D. [facility] Pulmonary & Critical Care Pager:  [phone] After 3pm or if no response, call [phone] 10:43 AM 06/12/16

## 2016-06-12 NOTE — Patient Instructions (Signed)
   Call me if your cough gets any worse or changes in amount or color of your phlegm.  I will see you back to review your test results in a couple of weeks.  Keep taking your medications as prescribed.  TESTS ORDERED: 1. CXR PA/LAT today 2. Maxillofacial CT LTD w/o 3. Full PFTs on or before next appointment 4. on or before next appointment 5. Serum CBC w/ differential & RAST Panel today

## 2016-06-12 NOTE — Telephone Encounter (Signed)
IMAGING CTA CHEST 09/16/15 (personally reviewed by me): Apical predominant emphysematous changes. Linear opacity right lung base suggestive of scar tissue or atelectasis adjacent small to moderate right-sided pleural effusion. No other rectal mass or opacity appreciated. Small left pleural effusion. Borderline enlarged mediastinal and hilar lymph nodes largest 1.1 cm by my measurement. No pulmonary emboli.  CARDIAC TTE (05/17/16): Moderate LVH with normal cavity size. EF 45-50% & grade 1 diastolic dysfunction. In coordinate septal motion. LA & RA normal in size. RV normal in size and function. No aortic stenosis or regurgitation. Aortic root normal in size. Trivial mitral regurgitation. Aneurysmal interatrial septum but cannot exclude PFO. No significant pulmonic regurgitation. Mild tricuspid regurgitation. No pericardial effusion.  MICROBIOLOGY 09/17/15 Hepatitis A IgM: Negative Hepatitis B surface antigen: Negative Hepatitis B core IgM: Negative Hepatitis C viral antibody: <0.1 HIV: Nonreactive   LABS 03/22/16 CBC: 11.7/15.1/45.3/224 BNP: 34.3  09/15/15 UDS:  Positive for benzodiazepines & THC

## 2016-06-13 ENCOUNTER — Telehealth: Payer: Self-pay | Admitting: Pulmonary Disease

## 2016-06-13 LAB — RESPIRATORY ALLERGY PROFILE REGION II ~~LOC~~
Allergen, C. Herbarum, M2: <0.1 kU/L
Allergen, Comm Silver Birch, t9: <0.1 kU/L
Allergen, Cottonwood, t14: <0.1 kU/L
Allergen, Mulberry, t76: <0.1 kU/L
Allergen, P. notatum, m1: <0.1 kU/L
Aspergillus fumigatus, m3: <0.1 kU/L
Bermuda Grass: <0.1 kU/L
Box Elder IgE: <0.1 kU/L
Cat Dander: <0.1 kU/L
Cockroach: <0.1 kU/L
Common Ragweed: <0.1 kU/L
Dog Dander: <0.1 kU/L
IgE (Immunoglobulin E), Serum: 60 kU/L (ref <115)
Johnson Grass: <0.1 kU/L
Sheep Sorrel IgE: <0.1 kU/L

## 2016-06-13 NOTE — Telephone Encounter (Signed)
Spoke with pt's wife, Marylu Lund. She was wanting to move up pt's appointment with [facility]. Advised her that we do not have any openings with [facility] before the date the pt is already scheduled for. She verbalized understanding and will check back later to see if anyone has canceled. Nothing further was needed.

## 2016-06-14 ENCOUNTER — Ambulatory Visit (INDEPENDENT_AMBULATORY_CARE_PROVIDER_SITE_OTHER)
Admission: RE | Admit: 2016-06-14 | Discharge: 2016-06-14 | Disposition: A | Discharge status: Home / Self Care | Payer: Medicare Other | Source: Ambulatory Visit | Attending: Pulmonary Disease | Admitting: Pulmonary Disease

## 2016-06-14 DIAGNOSIS — R05 Cough: Secondary | ICD-10-CM | POA: Diagnosis not present

## 2016-06-14 DIAGNOSIS — R0602 Shortness of breath: Secondary | ICD-10-CM | POA: Diagnosis not present

## 2016-06-14 DIAGNOSIS — R059 Cough, unspecified: Secondary | ICD-10-CM

## 2016-06-15 ENCOUNTER — Ambulatory Visit (HOSPITAL_COMMUNITY)
Admission: RE | Admit: 2016-06-15 | Discharge: 2016-06-15 | Disposition: A | Discharge status: Home / Self Care | Payer: Medicare Other | Source: Ambulatory Visit | Attending: Pulmonary Disease | Admitting: Pulmonary Disease

## 2016-06-15 ENCOUNTER — Ambulatory Visit (INDEPENDENT_AMBULATORY_CARE_PROVIDER_SITE_OTHER): Payer: Medicare Other | Admitting: Pulmonary Disease

## 2016-06-15 DIAGNOSIS — R0602 Shortness of breath: Secondary | ICD-10-CM | POA: Diagnosis not present

## 2016-06-15 DIAGNOSIS — R059 Cough, unspecified: Secondary | ICD-10-CM

## 2016-06-15 DIAGNOSIS — R05 Cough: Secondary | ICD-10-CM

## 2016-06-15 LAB — PULMONARY FUNCTION TEST
DL/VA % pred: 47 %
DL/VA: 2.25 ml/min/mmHg/L
DLCO COR % PRED: 42 %
DLCO cor: 14.79 ml/min/mmHg
DLCO unc % pred: 43 %
DLCO unc: 15.26 ml/min/mmHg
FEF 25-75 POST: 0.65 L/s
FEF 25-75 Pre: 0.6 L/sec
FEF2575-%Change-Post: 8 %
FEF2575-%PRED-POST: 22 %
FEF2575-%Pred-Pre: 20 %
FEV1-%CHANGE-POST: 2 %
FEV1-%PRED-POST: 50 %
FEV1-%Pred-Pre: 49 %
FEV1-Post: 1.88 L
FEV1-Pre: 1.83 L
FEV1FVC-%CHANGE-POST: -12 %
FEV1FVC-%Pred-Pre: 60 %
FEV6-%Change-Post: 5 %
FEV6-%Pred-Post: 78 %
FEV6-%Pred-Pre: 74 %
FEV6-Post: 3.69 L
FEV6-Pre: 3.5 L
FEV6FVC-%Change-Post: -10 %
FEV6FVC-%Pred-Post: 81 %
FEV6FVC-%Pred-Pre: 90 %
FVC-%Change-Post: 17 %
FVC-%PRED-POST: 96 %
FVC-%PRED-PRE: 82 %
FVC-POST: 4.75 L
FVC-PRE: 4.05 L
POST FEV1/FVC RATIO: 40 %
PRE FEV6/FVC RATIO: 86 %
Post FEV6/FVC ratio: 78 %
Pre FEV1/FVC ratio: 45 %
RV % pred: 159 %
RV: 3.92 L
TLC % pred: 110 %
TLC: 8.22 L

## 2016-06-15 MED ORDER — ALBUTEROL SULFATE (2.5 MG/3ML) 0.083% IN NEBU
2.5000 mg | INHALATION_SOLUTION | Freq: Once | RESPIRATORY_TRACT | Status: AC
Start: 2016-06-15 — End: 2016-06-15
  Administered 2016-06-15: 2.5 mg via RESPIRATORY_TRACT

## 2016-06-16 ENCOUNTER — Encounter: Payer: Self-pay | Admitting: Pulmonary Disease

## 2016-06-16 ENCOUNTER — Other Ambulatory Visit: Payer: Self-pay | Admitting: Pulmonary Disease

## 2016-06-16 ENCOUNTER — Other Ambulatory Visit: Payer: Medicare Other

## 2016-06-16 ENCOUNTER — Ambulatory Visit (INDEPENDENT_AMBULATORY_CARE_PROVIDER_SITE_OTHER): Payer: Medicare Other | Admitting: Pulmonary Disease

## 2016-06-16 VITALS — BP 140/82 | HR 73 | Ht 72.0 in | Wt 198.4 lb

## 2016-06-16 DIAGNOSIS — J309 Allergic rhinitis, unspecified: Secondary | ICD-10-CM | POA: Diagnosis not present

## 2016-06-16 DIAGNOSIS — J449 Chronic obstructive pulmonary disease, unspecified: Secondary | ICD-10-CM

## 2016-06-16 MED ORDER — BENZONATATE 100 MG PO CAPS: 100.0000 mg | ORAL_CAPSULE | Freq: Three times a day (TID) | ORAL | 0 refills | Status: DC | PRN

## 2016-06-16 MED ORDER — FLUTICASONE-UMECLIDIN-VILANT 100-62.5-25 MCG/INH IN AEPB: 1.0000 | INHALATION_SPRAY | Freq: Every day | RESPIRATORY_TRACT | 0 refills | Status: DC

## 2016-06-16 NOTE — Progress Notes (Signed)
Patient seen in the office today and instructed on use of Trelegy.  Patient expressed understanding and demonstrated technique.  

## 2016-06-16 NOTE — Progress Notes (Signed)
Test reviewed.  

## 2016-06-16 NOTE — Patient Instructions (Signed)
   Use the Trelegy inhaler that we are giving you today by inhaling once daily.  Remember to rinse, gargle, & spit after your inhaler to keep from getting thrush.  Call me for a prescription if you are able to tolerate the inhaler and it seems to help.  I will see you back in 6 weeks or sooner if needed.  TESTS ORDERED: 1. Serum Alpha-1 Antitrypsin Phenotype

## 2016-06-16 NOTE — Progress Notes (Signed)
Subjective:    Patient ID: Reginald Hahn, male    DOB: [birth year], 66 y.o.   MRN: [id number]  C.C.:  Follow-up for Severe COPD, Chronic Rhinitis, & Tobacco Use Disorder.   HPI Severe COPD:  Based on pulmonary function testing performed yesterday.  Follow-up continued to have intermittent coughing and wheezing. Dyspnea persists.  Allergic rhinitis: Continued sinus congestion and drainage. No evidence of specific allergy on serum testing.    Tobacco use disorder: Quit in 2017. Does have a significant history of tobacco use.  Review of Systems Denies any chest pain or pressure. Intermittent tightness. No subjective fever or chills. No abdominal pain or nausea.  Allergies  Allergen Reactions  . Codeine Itching    whelps  . Other     Seasonal allergies, "all spices, salt, pepper"    Current Outpatient Prescriptions on File Prior to Visit  Medication Sig Dispense Refill  . amiodarone (PACERONE) 200 MG tablet Take 1 tablet (200 mg total) by mouth daily. 90 tablet 3  . benzonatate (TESSALON) 100 MG capsule Take by mouth 3 (three) times daily as needed for cough.    . furosemide (LASIX) 20 MG tablet Take 1 tablet (20 mg total) by mouth as needed. 30 tablet 0  . losartan (COZAAR) 50 MG tablet Take 1 tablet (50 mg total) by mouth daily. (Patient taking differently: Take 25 mg by mouth daily. ) 30 tablet 6  . potassium chloride SA (K-DUR,KLOR-CON) 20 MEQ tablet Take 1 tablet (20 mEq total) by mouth daily. 30 tablet 5  . amiodarone (PACERONE) 200 MG tablet Take 1 tablet (200 mg total) by mouth daily. (Patient not taking: Reported on 06/16/2016) 30 tablet 5  . spironolactone (ALDACTONE) 25 MG tablet Take 1 tablet (25 mg total) by mouth daily. (Patient not taking: Reported on 06/16/2016) 30 tablet 5   No current facility-administered medications on file prior to visit.     Past Medical History:  Diagnosis Date  . Acute combined systolic and diastolic heart failure (HCC) 09/16/2015  . Allergic  rhinitis   . Allergy    seasonal allergies  . Arthritis   . Black widow spider bite   . Stroke (cerebrum) (HCC)   . Tobacco use    x40 years. 1 ppd    Past Surgical History:  Procedure Laterality Date  . APPENDECTOMY     Mid 1960s [childhood]  . SMALL INTESTINE SURGERY     as a child from swallowing bubble gum  . TONSILLECTOMY    . VASECTOMY      Family History  Problem Relation Age of Onset  . Heart disease Mother     died in her 64's.  . Diabetes Mother   . Prostate cancer Father     died in his 60's.  . Congenital heart disease Brother     died @ age 74.  . Lung disease Neg Hx     Social History   Social History  . Marital status: Divorced    Spouse name: N/A  . Number of children: N/A  . Years of education: N/A   Social History Main Topics  . Smoking status: Former Smoker    Packs/day: 2.00    Years: 50.00    Types: Cigarettes    Start date: 04/14/1963    Quit date: 10/09/2015  . Smokeless tobacco: Never Used     Comment: stopped for a couple of years in his 30s  . Alcohol use No  . Drug use: No  Comment: Used to smoke marijuana  . Sexual activity: Not Asked   Other Topics Concern  . 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 No pets currently. Remote exposure to a parrot for 2 years.       Objective:   Physical Exam BP 140/82 (BP Location: Right Arm, Patient Position: Sitting, Cuff Size: Large)   Pulse 73   Ht 6' (1.829 m)   Wt 198 lb 6.4 oz (90 kg)   SpO2 95%   BMI 26.91 kg/m   Gen.: No distress. Awake. Alert. Integument: No rash or bruising on exposed skin. Warm and dry. Extremities: No cyanosis or clubbing. HEENT: Mild bilateral nasal turbinate swelling. No oral ulcers. Moist  mucous membranes. Pulmonary: Overall clear with auscultation. No accessory muscle use on room air. Cardiovascular: Regular rate. No edema. Normal S1 & S2. Abdomen: Soft. Nontender. Nondistended.   PFT 06/15/16: FVC 4.05 L (92%) FEV1 133 L (49%) FEV1/FVC 0.45 FEF 25-30 pounds of 0.60 L (20%) positive bronchodilator response TLC 8.22 L (110%) RV 159% ERV 85% DLCO corrected 42%  06/15/16:  Walked 360 meters / Baseline Sat 94% on RA / Nadir Sat 88% (desat rapidly corrected)  IMAGING MAXILLOFACIAL CT LTD W/O 06/14/16 (per radiologist): No evidence of sinusitis.  CXR PA/LAT 06/12/16 (personally reviewed by me): Hyperinflation suggested by flattening of the diaphragms. No parenchymal mass or opacity appreciated. No pleural effusion. Heart normal in size & mediastinum normal in contour.  CTA CHEST 09/16/15 (previously reviewed by me): Apical predominant emphysematous changes. Linear opacity right lung base suggestive of scar tissue or atelectasis adjacent small to moderate right-sided pleural effusion. No other rectal mass or opacity appreciated. Small left pleural effusion. Borderline enlarged mediastinal and hilar lymph nodes largest 1.1 cm by my measurement. No pulmonary emboli.  CARDIAC TTE (05/17/16): Moderate LVH with normal cavity size. EF 45-50% & grade 1 diastolic dysfunction. In coordinate septal motion. LA & RA normal in size. RV normal in size and function. No aortic stenosis or regurgitation. Aortic root normal in size. Trivial mitral regurgitation. Aneurysmal interatrial septum but cannot exclude PFO. No significant pulmonic regurgitation. Mild tricuspid regurgitation. No pericardial effusion.  MICROBIOLOGY 09/17/15 Hepatitis A IgM: Negative Hepatitis B surface antigen: Negative Hepatitis B core IgM: Negative Hepatitis C viral antibody: <0.1 HIV: Nonreactive    LABS 06/12/16 IgE: 60 RAST panel: Negative CBC: 10.9/15.8/46.4/233 Eosinophils: 0.4  03/22/16 CBC:  11.7/15.1/45.3/224 BNP: 34.3  09/15/15 UDS:  Positive for benzodiazepines & THC     Assessment & Plan:  66 y.o. male with severe COPD, allergic rhinitis, and tobacco use disorder. Patient did have significant desaturation during his 6 prolonged test likely due to his underlying COPD/emphysema. I believe he would benefit from inhaler therapy with a significant bronchodilator response. With his long-standing and prior history of tobacco use I believe he would benefit from lung cancer screening and we'll refer him accordingly. His allergic rhinitis is likely due to his history of tobacco use.  1. Severe COPD: screening patient for alpha-1 antitrypsin deficiency. Starting Trelegy inhaler today. Patient instructed to call for prescription if this helps.  2. Allergic rhinitis:  Likely secondary to prior tobacco use.  Continuing Zyrtec.  3. Tobacco  use disorder:  Referring patient for lung cancer screening. 4. Follow-up: Return to clinic in 6 weeks or sooner if needed.   Donna Christen Jamison Neighbor, M.D. [facility] Pulmonary & Critical Care Pager:  [phone] After 3pm or if no response, call [phone] 10:38 AM 06/16/16

## 2016-06-16 NOTE — Addendum Note (Signed)
Addended by: Pamalee Leyden on: 06/16/2016 11:30 AM   Modules accepted: Orders

## 2016-06-20 ENCOUNTER — Telehealth: Payer: Self-pay | Admitting: Pulmonary Disease

## 2016-06-20 ENCOUNTER — Encounter (HOSPITAL_COMMUNITY): Payer: Self-pay | Admitting: Internal Medicine

## 2016-06-20 MED ORDER — BENZONATATE 100 MG PO CAPS: 100.0000 mg | ORAL_CAPSULE | Freq: Three times a day (TID) | ORAL | 1 refills | Status: DC | PRN

## 2016-06-20 NOTE — Telephone Encounter (Signed)
Spoke with pt's ex wife Marylu Lund (dpr on file), states that pt's cough is keeping up patient up at night.  Cough is typically nonproductive.  Pt states that Trelegy has helped with the cough, but requesting further recs for cough- a different inhaler, or a cough medicine.    Pt uses [facility] on [address].    jn please advise on recs.  Thanks!

## 2016-06-20 NOTE — Telephone Encounter (Signed)
The Trelegy will take time to have full effect. He should use the [employer] before bed. He can take up to 2 pills at one time. Also he can try using Zantac 150mg  at night before bed to help in case his cough is due to silent reflux. Thanks.

## 2016-06-20 NOTE — Telephone Encounter (Signed)
Spoke with Marylu Lund, aware of recs.  Refill on tessalon sent to pharmacy.  Nothing further needed.

## 2016-06-21 ENCOUNTER — Telehealth: Payer: Self-pay | Admitting: Pulmonary Disease

## 2016-06-21 MED ORDER — FLUTICASONE-UMECLIDIN-VILANT 100-62.5-25 MCG/INH IN AEPB: 1.0000 | INHALATION_SPRAY | Freq: Every day | RESPIRATORY_TRACT | 6 refills | Status: DC

## 2016-06-21 NOTE — Telephone Encounter (Signed)
Spoke with patient-states he needs Rx sent to [facility] for Trelegy inhaler-has coupon to get 1 month supply free and is leaving out of town tomorrow morning.   I have sent Rx and nothing more needed at this time.

## 2016-06-22 LAB — SPECIMEN STATUS REPORT

## 2016-06-26 ENCOUNTER — Ambulatory Visit (HOSPITAL_COMMUNITY): Payer: Medicare Other

## 2016-06-26 ENCOUNTER — Ambulatory Visit: Payer: Medicare Other | Admitting: Pulmonary Disease

## 2016-06-26 ENCOUNTER — Ambulatory Visit: Payer: Medicare Other

## 2016-08-01 ENCOUNTER — Encounter: Payer: Self-pay | Admitting: Pulmonary Disease

## 2016-08-01 ENCOUNTER — Ambulatory Visit (INDEPENDENT_AMBULATORY_CARE_PROVIDER_SITE_OTHER): Payer: Medicare Other | Admitting: Pulmonary Disease

## 2016-08-01 ENCOUNTER — Other Ambulatory Visit: Payer: Medicare Other

## 2016-08-01 VITALS — BP 130/82 | HR 79 | Ht 72.0 in | Wt 204.6 lb

## 2016-08-01 DIAGNOSIS — J309 Allergic rhinitis, unspecified: Secondary | ICD-10-CM

## 2016-08-01 DIAGNOSIS — J449 Chronic obstructive pulmonary disease, unspecified: Secondary | ICD-10-CM

## 2016-08-01 DIAGNOSIS — Z720 Tobacco use: Secondary | ICD-10-CM

## 2016-08-01 MED ORDER — ALBUTEROL SULFATE 108 (90 BASE) MCG/ACT IN AEPB: 2.0000 | INHALATION_SPRAY | RESPIRATORY_TRACT | 3 refills | Status: DC | PRN

## 2016-08-01 NOTE — Progress Notes (Signed)
Subjective:    Patient ID: Reginald Hahn, male    DOB: [birth year], 66 y.o.   MRN: [id number]  C.C.:  Follow-up for Severe COPD, Chronic Rhinitis, & Tobacco Use Disorder.   HPI Severe COPD: Patient started on  Trelegy inhaler at last appointment. He reports his cough is "virtually gone". He does feel his cough and dyspnea have improved significantly. He denies any wheezing. He has been doing some exercises and strength training recently.   Allergic rhinitis: Recommended Zyrtec at last appointment. He still has significant sinus congestion & drainage. Previously used Flonase with excellent results last year but had a bad taste after using it.   Tobacco use disorder: Quit in 2017. Referred to lung cancer screening program at last appointment.   Review of Systems Denies any chest pain or tightness. No fever, chills, or sweats. No abdominal pain or nausea.   Allergies  Allergen Reactions  . Codeine Itching    whelps  . Other     Seasonal allergies, "all spices, salt, pepper"    Current Outpatient Prescriptions on File Prior to Visit  Medication Sig Dispense Refill  . amiodarone (PACERONE) 200 MG tablet Take 1 tablet (200 mg total) by mouth daily. 90 tablet 3  . Fluticasone-Umeclidin-Vilant (TRELEGY ELLIPTA) 100-62.5-25 MCG/INH AEPB Inhale 1 puff into the lungs daily. 1 each 6  . furosemide (LASIX) 20 MG tablet Take 1 tablet (20 mg total) by mouth as needed. 30 tablet 0  . losartan (COZAAR) 50 MG tablet Take 1 tablet (50 mg total) by mouth daily. (Patient taking differently: Take 25 mg by mouth daily. ) 30 tablet 6  . potassium chloride SA (K-DUR,KLOR-CON) 20 MEQ tablet Take 1 tablet (20 mEq total) by mouth daily. 30 tablet 5  . spironolactone (ALDACTONE) 25 MG tablet Take 1 tablet (25 mg total) by mouth daily. 30 tablet 5  . amiodarone (PACERONE) 200 MG tablet Take 1 tablet (200 mg total) by mouth daily. (Patient not taking: Reported on 08/01/2016) 30 tablet 5  . benzonatate (TESSALON)  100 MG capsule Take 1 capsule (100 mg total) by mouth 3 (three) times daily as needed for cough. (Patient not taking: Reported on 08/01/2016) 45 capsule 1   No current facility-administered medications on file prior to visit.     Past Medical History:  Diagnosis Date  . Acute combined systolic and diastolic heart failure (HCC) 09/16/2015  . Allergic rhinitis   . Allergy    seasonal allergies  . Arthritis   . Black widow spider bite   . Stroke (cerebrum) (HCC)   . Tobacco use    x40 years. 1 ppd    Past Surgical History:  Procedure Laterality Date  . APPENDECTOMY     Mid 1960s [childhood]  . SMALL INTESTINE SURGERY     as a child from swallowing bubble gum  . TONSILLECTOMY    . VASECTOMY      Family History  Problem Relation Age of Onset  . Heart disease Mother     died in her 13's.  . Diabetes Mother   . Prostate cancer Father     died in his 25's.  . Congenital heart disease Brother     died @ age 34.  . Lung disease Neg Hx     Social History   Social History  . Marital status: Divorced    Spouse name: N/A  . Number of children: N/A  . Years of education: N/A   Social History Main Topics  . Smoking  status: Former Smoker    Packs/day: 2.00    Years: 50.00    Types: Cigarettes    Start date: 04/14/1963    Quit date: 10/09/2015  . Smokeless tobacco: Never Used     Comment: stopped for a couple of years in his 30s  . Alcohol use No  . Drug use: No     Comment: Used to smoke marijuana  . Sexual activity: Not Asked   Other Topics Concern  . 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 No pets currently. Remote exposure to a parrot for 2 years.       Objective:   Physical Exam BP 130/82 (BP Location:  Right Arm, Patient Position: Sitting, Cuff Size: Normal)   Pulse 79   Ht 6' (1.829 m)   Wt 204 lb 9.6 oz (92.8 kg)   SpO2 94%   BMI 27.75 kg/m   Gen.: Comfortable. No acute distress. Alert. Integument: No rash or bruising on exposed skin. Warm. Pharynx: No appreciated cervical or supraclavicular lymphadenopathy. HEENT: Mild bilateral nasal turbinate swelling. No scleral icterus or injection. Pulmonary: Clear with auscultation. Normal work of breathing on room air. Cardiovascular: Regular rate. No appreciable JVD. No edema. Abdomen: Soft. Mildly protuberant. Normal bowel sounds.  PFT 06/15/16: FVC 4.05 L (92%) FEV1 133 L (49%) FEV1/FVC 0.45 FEF 25-30 pounds of 0.60 L (20%) positive bronchodilator response TLC 8.22 L (110%) RV 159% ERV 85% DLCO corrected 42%  06/15/16:  Walked 360 meters / Baseline Sat 94% on RA / Nadir Sat 88% (desat rapidly corrected)  IMAGING MAXILLOFACIAL CT LTD W/O 06/14/16 (per radiologist): No evidence of sinusitis.  CXR PA/LAT 06/12/16 (previously reviewed by me): Hyperinflation suggested by flattening of the diaphragms. No parenchymal mass or opacity appreciated. No pleural effusion. Heart normal in size & mediastinum normal in contour.  CTA CHEST 09/16/15 (previously reviewed by me): Apical predominant emphysematous changes. Linear opacity right lung base suggestive of scar tissue or atelectasis adjacent small to moderate right-sided pleural effusion. No other rectal mass or opacity appreciated. Small left pleural effusion. Borderline enlarged mediastinal and hilar lymph nodes largest 1.1 cm by my measurement. No pulmonary emboli.  CARDIAC TTE (05/17/16): Moderate LVH with normal cavity size. EF 45-50% & grade 1 diastolic dysfunction. In coordinate septal motion. LA & RA normal in size. RV normal in size and function. No aortic stenosis or regurgitation. Aortic root normal in size. Trivial mitral regurgitation. Aneurysmal interatrial septum but cannot exclude PFO.  No significant pulmonic regurgitation. Mild tricuspid regurgitation. No pericardial effusion.  MICROBIOLOGY 09/17/15 Hepatitis A IgM: Negative Hepatitis B surface antigen: Negative Hepatitis B core IgM: Negative Hepatitis C viral antibody: <0.1 HIV: Nonreactive    LABS 06/12/16 IgE: 60 RAST panel: Negative CBC: 10.9/15.8/46.4/233 Eosinophils: 0.4  03/22/16 CBC: 11.7/15.1/45.3/224 BNP: 34.3  09/15/15 UDS:  Positive for benzodiazepines & THC     Assessment & Plan:  66 y.o. male with severe COPD, allergic rhinitis, & tobacco use disorder. Patient continues to have symptoms from his seemingly seasonal allergic rhinitis. Recommended intranasal corticosteroid therapy to help with relief of symptoms. His COPD seems to be better controlled on his current inhaler. I am prescribing him a rescue inhaler and  educated him on appropriate use. I do believe he would benefit from pulmonary rehabilitation to improve exercise tolerance and endurance. I instructed the patient contact my office if he had any new breathing problems or questions before next appointment.  1. Severe COPD:  Continuing patient on Trelegy inhaler. Prescribing a rescue inhaler today to use as needed. Screening for alpha-1 antitrypsin deficiency today. Repeating spirometry with bronchodilator challenge & 6 minute walk test on room air at next appointment. Referring to pulmonary rehabilitation.  2. Allergic rhinitis:  Continuing Zyrtec. Recommended trying Flonase again with appropriate technique.  3. Tobacco use disorder:  Patient missed phone call from screening coordinator. Reestablishing contact for lung cancer screening CT.  4. Health maintenance:  Discussed the role of immunizations in preventing respiratory illnesses and further decreasing pulmonary function. Patient going to research Pneumovax and Prevnar vaccines.  5. Follow-up: Return to clinic in 3 months or sooner if needed.    Donna Christen Jamison Neighbor, M.D. [facility] Pager:  [phone] After 3pm or if no response, call [phone] 1:59 PM 08/01/16

## 2016-08-01 NOTE — Addendum Note (Signed)
Addended by: Pamalee Leyden on: 08/01/2016 02:34 PM   Modules accepted: Orders

## 2016-08-01 NOTE — Patient Instructions (Signed)
   Look into the Pneumovax 23 and Prevnar 13 vaccines. Ideally these would be two vaccines we would use to keep you from getting sick.  We will have our lung cancer screening coordinator reach out to you again about a screening CT scan of your chest for lung cancer.  We are putting in a referral to Pulmonary Rehabilitation at [facility] and they will touch base with you about scheduling a visit.  Call me if you have any questions or new breathing problems before your next visit.  TESTS ORDERED: 1. Serum Alpha-1 Antitrypsin Phenotype today 2. Spirometry with bronchodilator challenge at next appointment 3. on room air at next appointment

## 2016-08-02 ENCOUNTER — Other Ambulatory Visit (HOSPITAL_COMMUNITY): Payer: Self-pay | Admitting: Cardiology

## 2016-08-02 MED ORDER — SPIRONOLACTONE 25 MG PO TABS: 25.0000 mg | ORAL_TABLET | Freq: Every day | ORAL | 2 refills | Status: DC

## 2016-08-04 LAB — ALPHA-1 ANTITRYPSIN PHENOTYPE: A-1 Antitrypsin: 99 mg/dL (ref 83–199)

## 2016-08-08 DEATH — deceased

## 2016-08-14 ENCOUNTER — Encounter (HOSPITAL_COMMUNITY): Payer: Self-pay | Admitting: Internal Medicine

## 2016-08-14 ENCOUNTER — Ambulatory Visit (HOSPITAL_COMMUNITY)
Admission: RE | Admit: 2016-08-14 | Discharge: 2016-08-14 | Disposition: A | Discharge status: Home / Self Care | Payer: Medicare Other | Source: Ambulatory Visit | Attending: Internal Medicine | Admitting: Internal Medicine

## 2016-08-14 ENCOUNTER — Other Ambulatory Visit (HOSPITAL_COMMUNITY): Payer: Self-pay | Admitting: *Deleted

## 2016-08-14 ENCOUNTER — Encounter (HOSPITAL_COMMUNITY): Payer: Self-pay | Admitting: *Deleted

## 2016-08-14 ENCOUNTER — Telehealth: Payer: Self-pay | Admitting: Pulmonary Disease

## 2016-08-14 VITALS — BP 157/92 | HR 69 | Wt 202.0 lb

## 2016-08-14 DIAGNOSIS — R9439 Abnormal result of other cardiovascular function study: Secondary | ICD-10-CM | POA: Diagnosis not present

## 2016-08-14 DIAGNOSIS — J449 Chronic obstructive pulmonary disease, unspecified: Secondary | ICD-10-CM | POA: Insufficient documentation

## 2016-08-14 DIAGNOSIS — I480 Paroxysmal atrial fibrillation: Secondary | ICD-10-CM | POA: Insufficient documentation

## 2016-08-14 DIAGNOSIS — I5022 Chronic systolic (congestive) heart failure: Secondary | ICD-10-CM

## 2016-08-14 DIAGNOSIS — I447 Left bundle-branch block, unspecified: Secondary | ICD-10-CM | POA: Insufficient documentation

## 2016-08-14 DIAGNOSIS — I10 Essential (primary) hypertension: Secondary | ICD-10-CM

## 2016-08-14 LAB — BASIC METABOLIC PANEL
ANION GAP: 9 (ref 5–15)
BUN: 16 mg/dL (ref 6–20)
CALCIUM: 9.2 mg/dL (ref 8.9–10.3)
CO2: 24 mmol/L (ref 22–32)
Chloride: 102 mmol/L (ref 101–111)
Creatinine, Ser: 1.09 mg/dL (ref 0.61–1.24)
Glucose, Bld: 89 mg/dL (ref 65–99)
Potassium: 4.4 mmol/L (ref 3.5–5.1)
Sodium: 135 mmol/L (ref 135–145)

## 2016-08-14 LAB — CBC
HCT: 48.4 % (ref 39.0–52.0)
HEMOGLOBIN: 16 g/dL (ref 13.0–17.0)
MCH: 30.8 pg (ref 26.0–34.0)
MCHC: 33.1 g/dL (ref 30.0–36.0)
MCV: 93.1 fL (ref 78.0–100.0)
Platelets: 237 10*3/uL (ref 150–400)
RBC: 5.2 MIL/uL (ref 4.22–5.81)
RDW: 13.2 % (ref 11.5–15.5)
WBC: 10.7 10*3/uL — ABNORMAL HIGH (ref 4.0–10.5)

## 2016-08-14 LAB — PROTIME-INR
INR: 1.05
Prothrombin Time: 13.7 seconds (ref 11.4–15.2)

## 2016-08-14 MED ORDER — SPIRONOLACTONE 25 MG PO TABS: 25.0000 mg | ORAL_TABLET | Freq: Every day | ORAL | 2 refills | Status: DC

## 2016-08-14 MED ORDER — LOSARTAN POTASSIUM 50 MG PO TABS: 50.0000 mg | ORAL_TABLET | Freq: Every day | ORAL | 6 refills | Status: DC

## 2016-08-14 NOTE — Progress Notes (Signed)
Advanced Heart Failure Clinic Note   Patient ID: Graysyn Hahn, male   DOB: 02-May-1950, 66 y.o.   MRN: [id number] Primary Cardiologist: Dr. Gala Romney   HPI: [name] "Reginald Hahn" is a 66 y.o. male with h/o COPD admitted in 6/17 with acute systolic HF, AF with RVR and cardiogenic shock. Presents for post-hospital f/u.   Patient was admitted 6/17 with acute systolic HF and new onset afib with RVR and EF found to be 15%.Felt to to have viral CM exacerbated by afib. Diuresed slowly so PICC placed and co-ox low. Started on IV amio and milrinone and subsequently diuresed well. Was pending cath and TEE/DC-CV but developed massive RP bleed and was transferred to ICU.   All anti-coagulants held. Received multiple transfusions. Subsequently recovered. During that time also had loss of peripheral vision in R eye. Head CT negative. Pre discharge had Myoview with EF 30-44%.  Inferior infarct vs gut attenuation. No ischemia. Refused cath.   I saw him a few months ago due to abnormal ECG and developed new LBBB.   He presents today for routine f/u. Struggling with allergies but otherwise feeling pretty. Energy level not great. Recently seen in Pulmonary Clinic with Dr. Jamison Neighbor and told he has severe COPD FEV1 1.83 (49%) FEF 25-75% 0.60L  DLCO 42%. Started on inhaler. Occasional CP with inhaler. Gets SOB with just mild activity. No palpitations. No orthopnea or PND. Only taking half of the losartan. SBP at home 130s.   Myoview 6/17  There was no ST segment deviation noted during stress.  The left ventricular ejection fraction is moderately decreased (30-44%).  Findings consistent with prior myocardial infarction.  This is an intermediate risk study.  nferior/inferolateral defect (moderate) that does not improve in the rest images consistent with scar and possible soft tissue attenuation. No significant ischemia.  Echo 8/17 EF 45% Echo 2/18 45-50%  ROS: All systems negative except as listed in  HPI, PMH and Problem List.  Past Medical History:  Diagnosis Date  . Acute combined systolic and diastolic heart failure (HCC) 09/16/2015  . Allergic rhinitis   . Allergy    seasonal allergies  . Arthritis   . Black widow spider bite   . Stroke (cerebrum) (HCC)   . Tobacco use    x40 years. 1 ppd     SH:  Social History   Social History  . Marital status: Divorced    Spouse name: N/A  . Number of children: N/A  . Years of education: N/A   Occupational History  . Not on file.   Social History Main Topics  . Smoking status: Former Smoker    Packs/day: 2.00    Years: 50.00    Types: Cigarettes    Start date: 04/14/1963    Quit date: 10/09/2015  . Smokeless tobacco: Never Used     Comment: stopped for a couple of years in his 30s  . Alcohol use No  . Drug use: No     Comment: Used to smoke marijuana  . Sexual activity: Not on file   Other Topics Concern  . 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 No pets currently. Remote exposure to a parrot for  2 years.     FH:  Family History  Problem Relation Age of Onset  . Heart disease Mother     died in her 31's.  . Diabetes Mother   . Prostate cancer Father     died in his 27's.  . Congenital heart disease Brother     died @ age 69.  . Lung disease Neg Hx     Past Medical History:  Diagnosis Date  . Acute combined systolic and diastolic heart failure (HCC) 09/16/2015  . Allergic rhinitis   . Allergy    seasonal allergies  . Arthritis   . Black widow spider bite   . Stroke (cerebrum) (HCC)   . Tobacco use    x40 years. 1 ppd    Current Outpatient Prescriptions  Medication Sig Dispense Refill  . Albuterol Sulfate (PROAIR RESPICLICK) 108 (90  Base) MCG/ACT AEPB Inhale 2 puffs into the lungs every 4 (four) hours as needed. 1 each 3  . amiodarone (PACERONE) 200 MG tablet Take 1 tablet (200 mg total) by mouth daily. 30 tablet 5  . Fluticasone-Umeclidin-Vilant (TRELEGY ELLIPTA) 100-62.5-25 MCG/INH AEPB Inhale 1 puff into the lungs daily. 1 each 6  . losartan (COZAAR) 50 MG tablet Take 25 mg by mouth daily.    . potassium chloride SA (K-DUR,KLOR-CON) 20 MEQ tablet Take 1 tablet (20 mEq total) by mouth daily. 30 tablet 5  . spironolactone (ALDACTONE) 25 MG tablet Take 1 tablet (25 mg total) by mouth daily. 90 tablet 2  . furosemide (LASIX) 20 MG tablet Take 1 tablet (20 mg total) by mouth as needed. (Patient not taking: Reported on 08/14/2016) 30 tablet 0   No current facility-administered medications for this encounter.     Vitals:   08/14/16 1101  BP: (!) 157/92  Pulse: 69  SpO2: 96%  Weight: 202 lb (91.6 kg)   Wt Readings from Last 3 Encounters:  08/14/16 202 lb (91.6 kg)  08/01/16 204 lb 9.6 oz (92.8 kg)  06/16/16 198 lb 6.4 oz (90 kg)     PHYSICAL EXAM: General:  Well appearing. No resp difficulty HEENT: normal Neck: supple. JVP 6. Carotids 2+ bilat; no bruits. No lymphadenopathy or thryomegaly appreciated. Cor: PMI nondisplaced. Regular rate & rhythm. No rubs, gallops or murmurs. Lungs: clear with decreased BS throughout Abdomen: soft, nontender, nondistended. No hepatosplenomegaly. No bruits or masses. Good bowel sounds. Extremities: no cyanosis, clubbing, rash, edema Neuro: alert & orientedx3, cranial nerves grossly intact. moves all 4 extremities w/o difficulty. Affect pleasant  ASSESSMENT & PLAN:  1. 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 ~35-40% by Myoview. Likely due to tachy CM in setting of rapid AF but Myoview also suggestive of CAD.  --Echo 2/18 EF 45-50% - Stable NYHA II-early III - Increase losartan to 50 mg daily.  (he did not do this after we suggested it at last visit) -  Continue spiro 25 mg daily.  - Pt declines BB or any up-titration  - Continue lasix 40 mg as needed.  - With CRFs, abnormal Myoview 6/17 (inferior scar), new LBBB and incomplete recovery of LVEF, I have recommend R/L HC. He agrees to proceed. Will arrange for later this weep 2. PAF with RVR - Remains in NSR by EKG on 200 mg amiodarone daily.  - Has refused AC due to h/o RP bleed.  - We have previously discussed potential switch to Tikosyn at some point down the road to avoid long-term SE of amio.  He would prefer to avoid hospitalization if he can. Will await results of cath. If insignificant CAD can consider flecainide instead. We will stop amio today. - This patients CHA2DS2-VASc Score and unadjusted Ischemic Stroke Rate (% per year) is equal to 5 (HTN, CHF, age, CVA) 3. h/o RP bleed - Resolved. No further.  4. Tobacco use - Not smoking.   5. Transient loss of R peripheral vision - Concerning for embolic CVA in setting of AF - Have previously suggested referral to Neuro but he has declined. Also declines MRI and carotid dopplers.   6. HTN - Elevated in clinic today but has been relatively well controlled at home.  - Increase losartan to 50 mg daily 50 mg daily.  7. LBBB - stable  I have reviewed the risks, indications, and alternatives to angioplasty and stenting with the patient. Risks include but are not limited to bleeding, infection, vascular injury, stroke, myocardial infection, arrhythmia, kidney injury, radiation-related injury in the case of prolonged fluoroscopy use, emergency cardiac surgery, and death. The patient understands the risks of serious complication is low (<1%) and he agrees to proceed.   Total time spent 35 minutes. Over half that time spent discussing above.   Arvilla Meres, MD  10:52 PM

## 2016-08-14 NOTE — Patient Instructions (Signed)
Stop Amiodarone   Increase Losartan to 50 mg (1 tab) daily  Heart Catheterization on Wed 5/9, see instruction sheet  Your physician recommends that you schedule a follow-up appointment in: 3 months

## 2016-08-14 NOTE — Telephone Encounter (Signed)
Letter placed in JN's lookat folder was a denial for proair respiclik, not trelegy.   lmtcb X1 for pt to clarify- denial for trelegy or proair?

## 2016-08-15 MED ORDER — ALBUTEROL SULFATE HFA 108 (90 BASE) MCG/ACT IN AERS: 2.0000 | INHALATION_SPRAY | RESPIRATORY_TRACT | 3 refills | Status: DC | PRN

## 2016-08-15 NOTE — Telephone Encounter (Signed)
lmtcb X2 for pt for clarification.

## 2016-08-15 NOTE — Telephone Encounter (Signed)
JN  Please Advise-  Pt's insurance will not cover Proair respiclick. The covered alternative is Ventolin HFA . Please advise if you want to change pt's inhaler or request a PA.

## 2016-08-15 NOTE — Telephone Encounter (Signed)
Called and spoke with pt and he is aware of rx for the ventolin that has been sent to the pharmacy.  Nothing further is needed.

## 2016-08-15 NOTE — Telephone Encounter (Signed)
Patient returned phone call, tried to get clarification, but patient have a hard time hearing.Charm Rings

## 2016-08-15 NOTE — Telephone Encounter (Signed)
Switching to Ventolin is fine - 2 puffs q4hr prn cough, wheezing, or dyspnea. #1 with 3 refills.

## 2016-08-24 ENCOUNTER — Telehealth (HOSPITAL_COMMUNITY): Payer: Self-pay | Admitting: *Deleted

## 2016-08-24 NOTE — Telephone Encounter (Signed)
Pt called c/o increased SOB, he states wt is stable, no edema, he is unsure if abd is swollen, feels it is slightly bigger than usual.  BP has been elevated at 150's unsure about HR.  He states he has been having increase SOB since stopping the Amio.  He is sch for cath 5/22.  Discussed w/Dr Bensimhon he would like to see pt in clinic in AM with EKG.  Pt aware appt sch for 9 am

## 2016-08-25 ENCOUNTER — Ambulatory Visit (HOSPITAL_COMMUNITY)
Admission: RE | Admit: 2016-08-25 | Discharge: 2016-08-25 | Disposition: A | Discharge status: Home / Self Care | Payer: Medicare Other | Source: Ambulatory Visit | Attending: Internal Medicine | Admitting: Internal Medicine

## 2016-08-25 ENCOUNTER — Encounter (HOSPITAL_COMMUNITY): Payer: Self-pay | Admitting: Internal Medicine

## 2016-08-25 VITALS — BP 132/86 | HR 85 | Wt 201.2 lb

## 2016-08-25 DIAGNOSIS — Z87891 Personal history of nicotine dependence: Secondary | ICD-10-CM | POA: Insufficient documentation

## 2016-08-25 DIAGNOSIS — Z79899 Other long term (current) drug therapy: Secondary | ICD-10-CM | POA: Diagnosis not present

## 2016-08-25 DIAGNOSIS — J449 Chronic obstructive pulmonary disease, unspecified: Secondary | ICD-10-CM

## 2016-08-25 DIAGNOSIS — Z8042 Family history of malignant neoplasm of prostate: Secondary | ICD-10-CM | POA: Diagnosis not present

## 2016-08-25 DIAGNOSIS — Z833 Family history of diabetes mellitus: Secondary | ICD-10-CM | POA: Diagnosis not present

## 2016-08-25 DIAGNOSIS — R0600 Dyspnea, unspecified: Secondary | ICD-10-CM

## 2016-08-25 DIAGNOSIS — I447 Left bundle-branch block, unspecified: Secondary | ICD-10-CM | POA: Insufficient documentation

## 2016-08-25 DIAGNOSIS — I10 Essential (primary) hypertension: Secondary | ICD-10-CM

## 2016-08-25 DIAGNOSIS — H53451 Other localized visual field defect, right eye: Secondary | ICD-10-CM

## 2016-08-25 DIAGNOSIS — I480 Paroxysmal atrial fibrillation: Secondary | ICD-10-CM | POA: Insufficient documentation

## 2016-08-25 DIAGNOSIS — I5022 Chronic systolic (congestive) heart failure: Secondary | ICD-10-CM | POA: Diagnosis not present

## 2016-08-25 DIAGNOSIS — I110 Hypertensive heart disease with heart failure: Secondary | ICD-10-CM | POA: Diagnosis not present

## 2016-08-25 DIAGNOSIS — I5042 Chronic combined systolic (congestive) and diastolic (congestive) heart failure: Secondary | ICD-10-CM | POA: Insufficient documentation

## 2016-08-25 DIAGNOSIS — Z8673 Personal history of transient ischemic attack (TIA), and cerebral infarction without residual deficits: Secondary | ICD-10-CM | POA: Insufficient documentation

## 2016-08-25 DIAGNOSIS — R05 Cough: Secondary | ICD-10-CM | POA: Diagnosis not present

## 2016-08-25 DIAGNOSIS — Z720 Tobacco use: Secondary | ICD-10-CM | POA: Diagnosis not present

## 2016-08-25 DIAGNOSIS — J9811 Atelectasis: Secondary | ICD-10-CM | POA: Insufficient documentation

## 2016-08-25 DIAGNOSIS — Z8249 Family history of ischemic heart disease and other diseases of the circulatory system: Secondary | ICD-10-CM | POA: Insufficient documentation

## 2016-08-25 NOTE — Progress Notes (Signed)
Advanced Heart Failure Clinic Note   Patient ID: Reginald Hahn, male   DOB: 08-17-1950, 66 y.o.   MRN: [id number] Primary Cardiologist: Dr. Gala Romney   HPI: [name] "Reginald Hahn" is a 66 y.o. male with h/o COPD admitted in 6/17 with acute systolic HF, AF with RVR and cardiogenic shock. Presents for post-hospital f/u.   Patient was admitted 6/17 with acute systolic HF and new onset afib with RVR and EF found to be 15%.Felt to to have viral CM exacerbated by afib. Diuresed slowly so PICC placed and co-ox low. Started on IV amio and milrinone and subsequently diuresed well. Was pending cath and TEE/DC-CV but developed massive RP bleed and was transferred to ICU.   All anti-coagulants held. Received multiple transfusions. Subsequently recovered. During that time also had loss of peripheral vision in R eye. Head CT negative. Pre discharge had Myoview with EF 30-44%.  Inferior infarct vs gut attenuation. No ischemia. Refused cath.   I saw him a few months ago due to abnormal ECG and developed new LBBB.   He presents today for add on for SOB. Seen several weeks ago and scheduled for [facility] for next week. Feeling worse and breathing has been getting gradually worse. Using albuterol inhaler more often each day. + bendopnea.  Very difficult to tie shoes and put on socks. Mild coughing with lying flat in bad, but no overt orthopnea. Occasional PND. No exertional CP. Does have palpitations. SOB with even mild activity.   Pulmonary Clinic with Dr. Jamison Neighbor and told he has severe COPD FEV1 1.83 (49%) FEF 25-75% 0.60L  DLCO 42%.   Myoview 6/17  There was no ST segment deviation noted during stress.  The left ventricular ejection fraction is moderately decreased (30-44%).  Findings consistent with prior myocardial infarction.  This is an intermediate risk study.  nferior/inferolateral defect (moderate) that does not improve in the rest images consistent with scar and possible soft tissue  attenuation. No significant ischemia.  Echo 8/17 EF 45% Echo 2/18 45-50%  Review of systems complete and found to be negative unless listed in HPI.    Past Medical History:  Diagnosis Date  . Acute combined systolic and diastolic heart failure (HCC) 09/16/2015  . Allergic rhinitis   . Allergy    seasonal allergies  . Arthritis   . Black widow spider bite   . Stroke (cerebrum) (HCC)   . Tobacco use    x40 years. 1 ppd   SH:  Social History   Social History  . Marital status: Divorced    Spouse name: N/A  . Number of children: N/A  . Years of education: N/A   Occupational History  . Not on file.   Social History Main Topics  . Smoking status: Former Smoker    Packs/day: 2.00    Years: 50.00    Types: Cigarettes    Start date: 04/14/1963    Quit date: 10/09/2015  . Smokeless tobacco: Never Used     Comment: stopped for a couple of years in his 30s  . Alcohol use No  . Drug use: No     Comment: Used to smoke marijuana  . Sexual activity: Not on file   Other Topics Concern  . 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 No pets currently. Remote exposure to a parrot for 2 years.     FH:  Family History  Problem Relation Age of Onset  . Heart disease Mother        died in her 67's.  . Diabetes Mother   . Prostate cancer Father        died in his 43's.  . Congenital heart disease Brother        died @ age 74.  . Lung disease Neg Hx     Past Medical History:  Diagnosis Date  . Acute combined systolic and diastolic heart failure (HCC) 09/16/2015  . Allergic rhinitis   . Allergy    seasonal allergies  . Arthritis   . Black widow spider bite   . Stroke (cerebrum) (HCC)   . Tobacco use    x40  years. 1 ppd    Current Outpatient Prescriptions  Medication Sig Dispense Refill  . albuterol (PROVENTIL HFA;VENTOLIN HFA) 108 (90 Base) MCG/ACT inhaler Inhale 2 puffs into the lungs every 4 (four) hours as needed for wheezing or shortness of breath. 1 Inhaler 3  . cetirizine (ZYRTEC) 10 MG tablet Take 10 mg by mouth daily.    . Fluticasone-Umeclidin-Vilant (TRELEGY ELLIPTA) 100-62.5-25 MCG/INH AEPB Inhale 1 puff into the lungs daily. 1 each 6  . losartan (COZAAR) 25 MG tablet Take 25 mg by mouth 2 (two) times daily.    . potassium chloride SA (K-DUR,KLOR-CON) 20 MEQ tablet Take 1 tablet (20 mEq total) by mouth daily. 30 tablet 5  . spironolactone (ALDACTONE) 25 MG tablet Take 1 tablet (25 mg total) by mouth daily. 90 tablet 2   No current facility-administered medications for this encounter.     Vitals:   08/25/16 0853  BP: 132/86  Pulse: 85  SpO2: 95%  Weight: 201 lb 4 oz (91.3 kg)   Wt Readings from Last 3 Encounters:  08/25/16 201 lb 4 oz (91.3 kg)  08/14/16 202 lb (91.6 kg)  08/01/16 204 lb 9.6 oz (92.8 kg)     PHYSICAL EXAM: General: Well appearing. No resp difficulty. HEENT: normal Neck: supple. JVD 6-7. Carotids 2+ bilat; no bruits. No thyromegaly or nodule noted. Cor: PMI nondisplaced. RRR, No M/G/R noted Lungs: Slightly diminished throughout, no obvious wheezes, rales or rhonchi.  Abdomen: soft, non-tender, distended, no HSM. No bruits or masses. +BS  Extremities: no cyanosis, clubbing, rash, R and LLE no edema.  Neuro: alert & orientedx3, cranial nerves grossly intact. moves all 4 extremities w/o difficulty. Affect pleasant   EKG: Sinus rhythm with 1st degree AV block, 77 bpm. Personally reviewed.  ASSESSMENT & PLAN:  1. 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 ~35-40% by Myoview. Likely due to tachy CM in setting of rapid AF but Myoview also suggestive of CAD.  --Echo 2/18 EF 45-50% - NYHA III-IIIb symptoms. Volume status looks  stable on exam. ReDS vest 27% indicating well controlled volume.  - Continue losartan 50 mg daily.  - Continue spiro 25 mg daily.  - Pt declines BB or any up-titration  - He has not been taking any lasix.  - With CRFs, abnormal Myoview 6/17 (inferior scar), new LBBB and incomplete recovery of LVEF, we have recommend R/L HC. He agrees to proceed. Planned for next week.  2. PAF with RVR - Remains in NSR by EKG. He has stopped amiodarone.  - Has  refused AC due to h/o RP bleed.  - We have previously discussed potential switch to Tikosyn at some point down the road to avoid long-term SE of amio. He would prefer to avoid hospitalization if he can. Will await results of cath. If insignificant CAD can consider flecainide instead.  - This patients CHA2DS2-VASc Score and unadjusted Ischemic Stroke Rate (% per year) is equal to 5 (HTN, CHF, age, CVA) 3. h/o RP bleed - Resolved. No further.  4. Tobacco use - Not smoking.  5. Transient loss of R peripheral vision - Concerning for embolic CVA in setting of AF - Pt declines Neuro referal, MRI, and/or carotid dopplers.  6. HTN - Mildly elevated on meds as above.  7. LBBB - QRS 134 on EKG today.   ReDS VEST 27. Think his dyspnea may be more driven by his COPD. Will order CXR.  Graciella Freer, PA-C  9:03 AM  Patient seen and examined with the above-signed Advanced Practice Provider and/or Housestaff. I personally reviewed laboratory data, imaging studies and relevant notes. I independently examined the patient and formulated the important aspects of the plan. I have edited the note to reflect any of my changes or salient points. I have personally discussed the plan with the patient and/or family.  Lung fluid stable on exam and by ReDS. No evidence of volume overload. Maintaining NSR. CXR (reviewed personally) is ok. Offered him the opportunity to proceed with R & L cath today but he doesn't have family with him here today. Will proceed with cath  on Tuesday.   Arvilla Meres, MD  11:03 PM

## 2016-08-29 ENCOUNTER — Ambulatory Visit (HOSPITAL_COMMUNITY)
Admission: RE | Admit: 2016-08-29 | Discharge: 2016-08-29 | Disposition: A | Discharge status: Home / Self Care | Payer: Medicare Other | Source: Ambulatory Visit | Attending: Internal Medicine | Admitting: Internal Medicine

## 2016-08-29 ENCOUNTER — Encounter (HOSPITAL_COMMUNITY)
Admission: RE | Disposition: A | Discharge status: Home / Self Care | Payer: Self-pay | Source: Ambulatory Visit | Attending: Internal Medicine

## 2016-08-29 DIAGNOSIS — I440 Atrioventricular block, first degree: Secondary | ICD-10-CM | POA: Diagnosis not present

## 2016-08-29 DIAGNOSIS — Z7951 Long term (current) use of inhaled steroids: Secondary | ICD-10-CM | POA: Insufficient documentation

## 2016-08-29 DIAGNOSIS — I447 Left bundle-branch block, unspecified: Secondary | ICD-10-CM | POA: Insufficient documentation

## 2016-08-29 DIAGNOSIS — I5022 Chronic systolic (congestive) heart failure: Secondary | ICD-10-CM

## 2016-08-29 DIAGNOSIS — J449 Chronic obstructive pulmonary disease, unspecified: Secondary | ICD-10-CM | POA: Insufficient documentation

## 2016-08-29 DIAGNOSIS — I110 Hypertensive heart disease with heart failure: Secondary | ICD-10-CM | POA: Diagnosis not present

## 2016-08-29 DIAGNOSIS — I2510 Atherosclerotic heart disease of native coronary artery without angina pectoris: Secondary | ICD-10-CM | POA: Insufficient documentation

## 2016-08-29 DIAGNOSIS — Z8673 Personal history of transient ischemic attack (TIA), and cerebral infarction without residual deficits: Secondary | ICD-10-CM | POA: Diagnosis not present

## 2016-08-29 DIAGNOSIS — I5042 Chronic combined systolic (congestive) and diastolic (congestive) heart failure: Secondary | ICD-10-CM | POA: Insufficient documentation

## 2016-08-29 DIAGNOSIS — Z87891 Personal history of nicotine dependence: Secondary | ICD-10-CM | POA: Insufficient documentation

## 2016-08-29 DIAGNOSIS — I428 Other cardiomyopathies: Secondary | ICD-10-CM | POA: Insufficient documentation

## 2016-08-29 DIAGNOSIS — Z8249 Family history of ischemic heart disease and other diseases of the circulatory system: Secondary | ICD-10-CM | POA: Insufficient documentation

## 2016-08-29 HISTORY — PX: CARDIAC CATHETERIZATION: SHX172

## 2016-08-29 HISTORY — PX: RIGHT/LEFT HEART CATH AND CORONARY ANGIOGRAPHY: CATH118266

## 2016-08-29 LAB — POCT I-STAT 3, ART BLOOD GAS (G3+)
Acid-base deficit: 2 mmol/L (ref 0.0–2.0)
Bicarbonate: 22.8 mmol/L (ref 20.0–28.0)
O2 SAT: 90 %
PH ART: 7.399 (ref 7.350–7.450)
TCO2: 24 mmol/L (ref 0–100)
pCO2 arterial: 36.9 mmHg (ref 32.0–48.0)
pO2, Arterial: 59 mmHg — ABNORMAL LOW (ref 83.0–108.0)

## 2016-08-29 LAB — POCT I-STAT 3, VENOUS BLOOD GAS (G3P V)
Acid-Base Excess: 2 mmol/L (ref 0.0–2.0)
BICARBONATE: 25 mmol/L (ref 20.0–28.0)
Bicarbonate: 26.9 mmol/L (ref 20.0–28.0)
O2 Saturation: 62 %
O2 Saturation: 63 %
PH VEN: 7.386 (ref 7.250–7.430)
TCO2: 28 mmol/L (ref 0–100)
TCO2: 26 mmol/L (ref 0–100)
pCO2, Ven: 41.6 mmHg — ABNORMAL LOW (ref 44.0–60.0)
pCO2, Ven: 43.1 mmHg — ABNORMAL LOW (ref 44.0–60.0)
pH, Ven: 7.403 (ref 7.250–7.430)
pO2, Ven: 32 mmHg (ref 32.0–45.0)
pO2, Ven: 33 mmHg (ref 32.0–45.0)

## 2016-08-29 SURGERY — RIGHT/LEFT HEART CATH AND CORONARY ANGIOGRAPHY
Anesthesia: LOCAL

## 2016-08-29 MED ORDER — ASPIRIN 81 MG PO CHEW
CHEWABLE_TABLET | ORAL | Status: AC
  Filled 2016-08-29: qty 1

## 2016-08-29 MED ORDER — SODIUM CHLORIDE 0.9% FLUSH: 3.0000 mL | INTRAVENOUS | Status: DC | PRN

## 2016-08-29 MED ORDER — HEPARIN (PORCINE) IN NACL 2-0.9 UNIT/ML-% IJ SOLN
INTRAMUSCULAR | Status: AC | PRN
  Administered 2016-08-29: 1000 mL via INTRA_ARTERIAL

## 2016-08-29 MED ORDER — HEPARIN (PORCINE) IN NACL 2-0.9 UNIT/ML-% IJ SOLN
INTRAMUSCULAR | Status: AC
  Filled 2016-08-29: qty 1000

## 2016-08-29 MED ORDER — SODIUM CHLORIDE 0.9 % IV SOLN
INTRAVENOUS | Status: DC
  Administered 2016-08-29: 10:00:00 via INTRAVENOUS

## 2016-08-29 MED ORDER — IOPAMIDOL (ISOVUE-370) INJECTION 76%
INTRAVENOUS | Status: AC
  Filled 2016-08-29: qty 125

## 2016-08-29 MED ORDER — VERAPAMIL HCL 2.5 MG/ML IV SOLN
INTRAVENOUS | Status: AC
  Filled 2016-08-29: qty 2

## 2016-08-29 MED ORDER — SODIUM CHLORIDE 0.9 % IV SOLN: 250.0000 mL | INTRAVENOUS | Status: DC | PRN

## 2016-08-29 MED ORDER — SODIUM CHLORIDE 0.9 % IV SOLN: INTRAVENOUS | Status: AC

## 2016-08-29 MED ORDER — ASPIRIN 81 MG PO CHEW
81.0000 mg | CHEWABLE_TABLET | ORAL | Status: AC
  Administered 2016-08-29: 81 mg via ORAL

## 2016-08-29 MED ORDER — HEPARIN SODIUM (PORCINE) 1000 UNIT/ML IJ SOLN
INTRAMUSCULAR | Status: AC
  Filled 2016-08-29: qty 1

## 2016-08-29 MED ORDER — IOPAMIDOL (ISOVUE-370) INJECTION 76%
INTRAVENOUS | Status: DC | PRN
  Administered 2016-08-29: 55 mL via INTRA_ARTERIAL

## 2016-08-29 MED ORDER — HEPARIN SODIUM (PORCINE) 1000 UNIT/ML IJ SOLN
INTRAMUSCULAR | Status: DC | PRN
  Administered 2016-08-29: 3000 [IU] via INTRAVENOUS

## 2016-08-29 MED ORDER — SODIUM CHLORIDE 0.9% FLUSH
3.0000 mL | Freq: Two times a day (BID) | INTRAVENOUS | Status: DC
Start: 2016-08-29 — End: 2016-08-29

## 2016-08-29 MED ORDER — MIDAZOLAM HCL 2 MG/2ML IJ SOLN
INTRAMUSCULAR | Status: DC | PRN
  Administered 2016-08-29: 2 mg via INTRAVENOUS

## 2016-08-29 MED ORDER — MIDAZOLAM HCL 2 MG/2ML IJ SOLN
INTRAMUSCULAR | Status: AC
  Filled 2016-08-29: qty 2

## 2016-08-29 MED ORDER — ONDANSETRON HCL 4 MG/2ML IJ SOLN: 4.0000 mg | Freq: Four times a day (QID) | INTRAMUSCULAR | Status: DC | PRN

## 2016-08-29 MED ORDER — FENTANYL CITRATE (PF) 100 MCG/2ML IJ SOLN
INTRAMUSCULAR | Status: AC
  Filled 2016-08-29: qty 2

## 2016-08-29 MED ORDER — VERAPAMIL HCL 2.5 MG/ML IV SOLN
INTRAVENOUS | Status: DC | PRN
  Administered 2016-08-29: 10 mL via INTRA_ARTERIAL

## 2016-08-29 MED ORDER — LIDOCAINE HCL (PF) 1 % IJ SOLN
INTRAMUSCULAR | Status: DC | PRN
Start: 2016-08-29 — End: 2016-08-29
  Administered 2016-08-29: 3 mL via INTRADERMAL

## 2016-08-29 MED ORDER — ACETAMINOPHEN 325 MG PO TABS: 650.0000 mg | ORAL_TABLET | ORAL | Status: DC | PRN

## 2016-08-29 MED ORDER — LIDOCAINE HCL 1 % IJ SOLN
INTRAMUSCULAR | Status: AC
  Filled 2016-08-29: qty 20

## 2016-08-29 MED ORDER — FENTANYL CITRATE (PF) 100 MCG/2ML IJ SOLN
INTRAMUSCULAR | Status: DC | PRN
  Administered 2016-08-29: 25 ug via INTRAVENOUS

## 2016-08-29 MED ORDER — SODIUM CHLORIDE 0.9% FLUSH: 3.0000 mL | Freq: Two times a day (BID) | INTRAVENOUS | Status: DC

## 2016-08-29 SURGICAL SUPPLY — 15 items
CATH 5FR JL3.5 JR4 ANG PIG MP (CATHETERS) ×1 IMPLANT
CATH BALLN WEDGE 5F 110CM (CATHETERS) ×1 IMPLANT
DEVICE RAD COMP TR BAND LRG (VASCULAR PRODUCTS) ×1 IMPLANT
GLIDESHEATH SLEND A-KIT 6F 22G (SHEATH) IMPLANT
GLIDESHEATH SLEND SS 6F .021 (SHEATH) ×1 IMPLANT
GUIDEWIRE .025 260CM (WIRE) ×1 IMPLANT
GUIDEWIRE INQWIRE 1.5J.035X260 (WIRE) IMPLANT
INQWIRE 1.5J .035X260CM (WIRE) ×2
KIT ENCORE 26 ADVANTAGE (KITS) IMPLANT
KIT HEART LEFT (KITS) ×2 IMPLANT
PACK CARDIAC CATHETERIZATION (CUSTOM PROCEDURE TRAY) ×2 IMPLANT
SHEATH FAST CATH BRACH 5F 5CM (SHEATH) ×1 IMPLANT
SYR MEDRAD MARK V 150ML (SYRINGE) ×2 IMPLANT
TRANSDUCER W/STOPCOCK (MISCELLANEOUS) ×2 IMPLANT
TUBING CIL FLEX 10 FLL-RA (TUBING) ×2 IMPLANT

## 2016-08-29 NOTE — Discharge Instructions (Signed)
Radial Site Care °Refer to this sheet in the next few weeks. These instructions provide you with information about caring for yourself after your procedure. Your health care provider may also give you more specific instructions. Your treatment has been planned according to current medical practices, but problems sometimes occur. Call your health care provider if you have any problems or questions after your procedure. °What can I expect after the procedure? °After your procedure, it is typical to have the following: °· Bruising at the radial site that usually fades within 1-2 weeks. °· Blood collecting in the tissue (hematoma) that may be painful to the touch. It should usually decrease in size and tenderness within 1-2 weeks. °Follow these instructions at home: °· Take medicines only as directed by your health care provider. °· You may shower 24-48 hours after the procedure or as directed by your health care provider. Remove the bandage (dressing) and gently wash the site with plain soap and water. Pat the area dry with a clean towel. Do not rub the site, because this may cause bleeding. °· Do not take baths, swim, or use a hot tub until your health care provider approves. °· Check your insertion site every day for redness, swelling, or drainage. °· Do not apply powder or lotion to the site. °· Do not flex or bend the affected arm for 24 hours or as directed by your health care provider. °· Do not push or pull heavy objects with the affected arm for 24 hours or as directed by your health care provider. °· Do not lift over 10 lb (4.5 kg) for 5 days after your procedure or as directed by your health care provider. °· Ask your health care provider when it is okay to: °¨ Return to work or school. °¨ Resume usual physical activities or sports. °¨ Resume sexual activity. °· Do not drive home if you are discharged the same day as the procedure. Have someone else drive you. °· You may drive 24 hours after the procedure  unless otherwise instructed by your health care provider. °· Do not operate machinery or power tools for 24 hours after the procedure. °· If your procedure was done as an outpatient procedure, which means that you went home the same day as your procedure, a responsible adult should be with you for the first 24 hours after you arrive home. °· Keep all follow-up visits as directed by your health care provider. This is important. °Contact a health care provider if: °· You have a fever. °· You have chills. °· You have increased bleeding from the radial site. Hold pressure on the site. °Get help right away if: °· You have unusual pain at the radial site. °· You have redness, warmth, or swelling at the radial site. °· You have drainage (other than a small amount of blood on the dressing) from the radial site. °· The radial site is bleeding, and the bleeding does not stop after 30 minutes of holding steady pressure on the site. °· Your arm or hand becomes pale, cool, tingly, or numb. °This information is not intended to replace advice given to you by your health care provider. Make sure you discuss any questions you have with your health care provider. °Document Released: 04/29/2010 Document Revised: 09/02/2015 Document Reviewed: 10/13/2013 °Elsevier Interactive Patient Education © 2017 [employer] Inc[employer] ° °

## 2016-08-29 NOTE — H&P (View-Only) (Signed)
Advanced Heart Failure Clinic Note   Patient ID: Reginald Hahn, male   DOB: 08-17-1950, 66 y.o.   MRN: [id number] Primary Cardiologist: Dr. Gala Romney   HPI: Reginald Hahn "Reginald Hahn" is a 66 y.o. male with h/o COPD admitted in 6/17 with acute systolic HF, AF with RVR and cardiogenic shock. Presents for post-hospital f/u.   Patient was admitted 6/17 with acute systolic HF and new onset afib with RVR and EF found to be 15%.Felt to to have viral CM exacerbated by afib. Diuresed slowly so PICC placed and co-ox low. Started on IV amio and milrinone and subsequently diuresed well. Was pending cath and TEE/DC-CV but developed massive RP bleed and was transferred to ICU.   All anti-coagulants held. Received multiple transfusions. Subsequently recovered. During that time also had loss of peripheral vision in R eye. Head CT negative. Pre discharge had Myoview with EF 30-44%.  Inferior infarct vs gut attenuation. No ischemia. Refused cath.   I saw him a few months ago due to abnormal ECG and developed new LBBB.   He presents today for add on for SOB. Seen several weeks ago and scheduled for [facility] for next week. Feeling worse and breathing has been getting gradually worse. Using albuterol inhaler more often each day. + bendopnea.  Very difficult to tie shoes and put on socks. Mild coughing with lying flat in bad, but no overt orthopnea. Occasional PND. No exertional CP. Does have palpitations. SOB with even mild activity.   Pulmonary Clinic with Dr. Jamison Neighbor and told he has severe COPD FEV1 1.83 (49%) FEF 25-75% 0.60L  DLCO 42%.   Myoview 6/17  There was no ST segment deviation noted during stress.  The left ventricular ejection fraction is moderately decreased (30-44%).  Findings consistent with prior myocardial infarction.  This is an intermediate risk study.  nferior/inferolateral defect (moderate) that does not improve in the rest images consistent with scar and possible soft tissue  attenuation. No significant ischemia.  Echo 8/17 EF 45% Echo 2/18 45-50%  Review of systems complete and found to be negative unless listed in HPI.    Past Medical History:  Diagnosis Date  . Acute combined systolic and diastolic heart failure (HCC) 09/16/2015  . Allergic rhinitis   . Allergy    seasonal allergies  . Arthritis   . Black widow spider bite   . Stroke (cerebrum) (HCC)   . Tobacco use    x40 years. 1 ppd   SH:  Social History   Social History  . Marital status: Divorced    Spouse name: N/A  . Number of children: N/A  . Years of education: N/A   Occupational History  . Not on file.   Social History Main Topics  . Smoking status: Former Smoker    Packs/day: 2.00    Years: 50.00    Types: Cigarettes    Start date: 04/14/1963    Quit date: 10/09/2015  . Smokeless tobacco: Never Used     Comment: stopped for a couple of years in his 30s  . Alcohol use No  . Drug use: No     Comment: Used to smoke marijuana  . Sexual activity: Not on file   Other Topics Concern  . 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 No pets currently. Remote exposure to a parrot for 2 years.     FH:  Family History  Problem Relation Age of Onset  . Heart disease Mother        died in her 67's.  . Diabetes Mother   . Prostate cancer Father        died in his 43's.  . Congenital heart disease Brother        died @ age 74.  . Lung disease Neg Hx     Past Medical History:  Diagnosis Date  . Acute combined systolic and diastolic heart failure (HCC) 09/16/2015  . Allergic rhinitis   . Allergy    seasonal allergies  . Arthritis   . Black widow spider bite   . Stroke (cerebrum) (HCC)   . Tobacco use    x40  years. 1 ppd    Current Outpatient Prescriptions  Medication Sig Dispense Refill  . albuterol (PROVENTIL HFA;VENTOLIN HFA) 108 (90 Base) MCG/ACT inhaler Inhale 2 puffs into the lungs every 4 (four) hours as needed for wheezing or shortness of breath. 1 Inhaler 3  . cetirizine (ZYRTEC) 10 MG tablet Take 10 mg by mouth daily.    . Fluticasone-Umeclidin-Vilant (TRELEGY ELLIPTA) 100-62.5-25 MCG/INH AEPB Inhale 1 puff into the lungs daily. 1 each 6  . losartan (COZAAR) 25 MG tablet Take 25 mg by mouth 2 (two) times daily.    . potassium chloride SA (K-DUR,KLOR-CON) 20 MEQ tablet Take 1 tablet (20 mEq total) by mouth daily. 30 tablet 5  . spironolactone (ALDACTONE) 25 MG tablet Take 1 tablet (25 mg total) by mouth daily. 90 tablet 2   No current facility-administered medications for this encounter.     Vitals:   08/25/16 0853  BP: 132/86  Pulse: 85  SpO2: 95%  Weight: 201 lb 4 oz (91.3 kg)   Wt Readings from Last 3 Encounters:  08/25/16 201 lb 4 oz (91.3 kg)  08/14/16 202 lb (91.6 kg)  08/01/16 204 lb 9.6 oz (92.8 kg)     PHYSICAL EXAM: General: Well appearing. No resp difficulty. HEENT: normal Neck: supple. JVD 6-7. Carotids 2+ bilat; no bruits. No thyromegaly or nodule noted. Cor: PMI nondisplaced. RRR, No M/G/R noted Lungs: Slightly diminished throughout, no obvious wheezes, rales or rhonchi.  Abdomen: soft, non-tender, distended, no HSM. No bruits or masses. +BS  Extremities: no cyanosis, clubbing, rash, R and LLE no edema.  Neuro: alert & orientedx3, cranial nerves grossly intact. moves all 4 extremities w/o difficulty. Affect pleasant   EKG: Sinus rhythm with 1st degree AV block, 77 bpm. Personally reviewed.  ASSESSMENT & PLAN:  1. 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 ~35-40% by Myoview. Likely due to tachy CM in setting of rapid AF but Myoview also suggestive of CAD.  --Echo 2/18 EF 45-50% - NYHA III-IIIb symptoms. Volume status looks  stable on exam. ReDS vest 27% indicating well controlled volume.  - Continue losartan 50 mg daily.  - Continue spiro 25 mg daily.  - Pt declines BB or any up-titration  - He has not been taking any lasix.  - With CRFs, abnormal Myoview 6/17 (inferior scar), new LBBB and incomplete recovery of LVEF, we have recommend R/L HC. He agrees to proceed. Planned for next week.  2. PAF with RVR - Remains in NSR by EKG. He has stopped amiodarone.  - Has  refused AC due to h/o RP bleed.  - We have previously discussed potential switch to Tikosyn at some point down the road to avoid long-term SE of amio. He would prefer to avoid hospitalization if he can. Will await results of cath. If insignificant CAD can consider flecainide instead.  - This patients CHA2DS2-VASc Score and unadjusted Ischemic Stroke Rate (% per year) is equal to 5 (HTN, CHF, age, CVA) 3. h/o RP bleed - Resolved. No further.  4. Tobacco use - Not smoking.  5. Transient loss of R peripheral vision - Concerning for embolic CVA in setting of AF - Pt declines Neuro referal, MRI, and/or carotid dopplers.  6. HTN - Mildly elevated on meds as above.  7. LBBB - QRS 134 on EKG today.   ReDS VEST 27. Think his dyspnea may be more driven by his COPD. Will order CXR.  Graciella Freer, PA-C  9:03 AM  Patient seen and examined with the above-signed Advanced Practice Provider and/or Housestaff. I personally reviewed laboratory data, imaging studies and relevant notes. I independently examined the patient and formulated the important aspects of the plan. I have edited the note to reflect any of my changes or salient points. I have personally discussed the plan with the patient and/or family.  Lung fluid stable on exam and by ReDS. No evidence of volume overload. Maintaining NSR. CXR (reviewed personally) is ok. Offered him the opportunity to proceed with R & L cath today but he doesn't have family with him here today. Will proceed with cath  on Tuesday.   Arvilla Meres, MD  11:03 PM

## 2016-08-29 NOTE — Interval H&P Note (Signed)
History and Physical Interval Note:  08/29/2016 12:41 PM  Reginald Hahn  has presented today for surgery, with the diagnosis of chf  The various methods of treatment have been discussed with the patient and family. After consideration of risks, benefits and other options for treatment, the patient has consented to  Procedure(s): Right/Left Heart Cath and Coronary Angiography (N/A) and possible coronary angioplasty as a surgical intervention .  The patient's history has been reviewed, patient examined, no change in status, stable for surgery.  I have reviewed the patient's chart and labs.  Questions were answered to the patient's satisfaction.     Byan Poplaski, Reuel Boom

## 2016-08-30 ENCOUNTER — Encounter (HOSPITAL_COMMUNITY): Payer: Self-pay | Admitting: Internal Medicine

## 2016-08-30 ENCOUNTER — Ambulatory Visit: Payer: Medicare Other | Admitting: Internal Medicine

## 2016-08-31 ENCOUNTER — Encounter: Payer: Self-pay | Admitting: Internal Medicine

## 2016-08-31 ENCOUNTER — Ambulatory Visit (INDEPENDENT_AMBULATORY_CARE_PROVIDER_SITE_OTHER): Payer: Medicare Other | Admitting: Internal Medicine

## 2016-08-31 VITALS — BP 140/90 | HR 66 | Ht 72.0 in | Wt 201.0 lb

## 2016-08-31 DIAGNOSIS — R05 Cough: Secondary | ICD-10-CM | POA: Diagnosis not present

## 2016-08-31 DIAGNOSIS — J449 Chronic obstructive pulmonary disease, unspecified: Secondary | ICD-10-CM

## 2016-08-31 DIAGNOSIS — R058 Other specified cough: Secondary | ICD-10-CM

## 2016-08-31 MED ORDER — BUDESONIDE-FORMOTEROL FUMARATE 80-4.5 MCG/ACT IN AERO: INHALATION_SPRAY | RESPIRATORY_TRACT | 0 refills | Status: DC

## 2016-08-31 MED ORDER — BUDESONIDE-FORMOTEROL FUMARATE 80-4.5 MCG/ACT IN AERO: INHALATION_SPRAY | RESPIRATORY_TRACT | 11 refills | Status: DC

## 2016-08-31 NOTE — Progress Notes (Signed)
Subjective:    Patient ID: Reginald Hahn, male    DOB: [birth year], 66 y.o.   MRN: [id number]    Reginald Hahn  08/01/17 Severe COPD: Patient started on  Trelegy inhaler at last appointment. He reports his cough is "virtually gone". He does feel his cough and dyspnea have improved significantly. He denies any wheezing. He has been doing some exercises and strength training recently.   Allergic rhinitis: Recommended Zyrtec at last appointment. He still has significant sinus congestion & drainage. Previously used Flonase with excellent results last year but had a bad taste after using it.   Tobacco use disorder: Quit in 2017. Referred to lung cancer screening program at last appointment.  rec  Look into the Pneumovax 23 and Prevnar 13 vaccines. Ideally these would be two vaccines we would use to keep you from getting sick.  We will have our lung cancer screening coordinator reach out to you again about a screening CT scan of your chest for lung cancer.  We are putting in a referral to Pulmonary Rehabilitation at [facility] and they will touch base with you about scheduling a visit.       08/31/2016  f/u ov/Wert re: MZ/ COPD Gold  III flare of cough > sob x 2 weeks/ Chief Complaint  Patient presents with  . Acute Visit    Increased cough x 2 wks. He is coughing the the point his chest feels sore. He occ produces minima clear sputum. He states he occ coughs until the point the he gets dizzy and passes out. He is using albuterol inhaler 3-4 x per day.   cough had improved on trelegy then gradually worse cough esp at hs  Most dry but quite severe   Doe = MMRC2 = can't walk a nl pace on a flat grade s sob but does fine slow and flat eg shopping  No obvious day to day or daytime variability or assoc excess/ purulent sputum or mucus plugs or hemoptysis or cp or chest tightness, subjective wheeze or overt sinus or hb symptoms. No unusual exp hx or h/o childhood pna/ asthma or knowledge of  premature birth.  Sleeping ok without nocturnal  or early am exacerbation  of respiratory  c/o's or need for noct saba. Also denies any obvious fluctuation of symptoms with weather or environmental changes or other aggravating or alleviating factors except as outlined above   Current Medications, Allergies, Complete Past Medical History, Past Surgical History, Family History, and Social History were reviewed in [name] record.  ROS  The following are not active complaints unless bolded sore throat, dysphagia, dental problems, itching, sneezing,  nasal congestion or excess/ purulent secretions, ear ache,   fever, chills, sweats, unintended wt loss, classically pleuritic or exertional cp,  orthopnea pnd or leg swelling, presyncope, palpitations, abdominal pain, anorexia, nausea, vomiting, diarrhea  or change in bowel or bladder habits, change in stools or urine, dysuria,hematuria,  rash, arthralgias, visual complaints, headache, numbness, weakness or ataxia or problems with walking or coordination,  change in mood/affect or memory.                     Objective:   Physical Exam  amb wm unusual affect  Wt Readings from Last 3 Encounters:  08/31/16 201 lb (91.2 kg)  08/29/16 202 lb (91.6 kg)  08/25/16 201 lb 4 oz (91.3 kg)    Vital signs reviewed - Note on arrival 02 sats  93% on RA  HEENT: nl dentition, turbinates bilaterally, and oropharynx. Nl external ear canals without cough reflex   NECK :  without JVD/Nodes/TM/ nl carotid upstrokes bilaterally   LUNGS: no acc muscle use,  Mild barrel contour, mild hyper -resonant bilaterally with  mostly pseudohweezes better with plm     CV:  RRR  no s3 or murmur or increase in P2, and no edema   ABD:  soft and nontender with nl inspiratory excursion in the supine position. No bruits or organomegaly appreciated, bowel sounds nl  MS:  Nl gait/ ext warm without deformities, calf tenderness, cyanosis or clubbing No  obvious joint restrictions   SKIN: warm and dry without lesions    NEURO:  alert, approp, nl sensorium with  no motor or cerebellar deficits apparent.       I personally reviewed images and agree with radiology impression as follows:  CXR:   08/25/16 Stable mild left basilar subsegmental atelectasis or scarring.      PFT 06/15/16: FVC 4.05 L (92%) FEV1 133 L (49%) FEV1/FVC 0.45  TLC 8.22 L (110%) RV 159% ERV 85% DLCO corrected 42%  06/15/16:  Walked 360 meters / Baseline Sat 94% on RA / Nadir Sat 88% (desat rapidly corrected)  IMAGING MAXILLOFACIAL CT LTD W/O 06/14/16 (per radiologist): No evidence of sinusitis.   .  CTA CHEST 09/16/15 (previously reviewed by me): Apical predominant emphysematous changes. Linear opacity right lung base suggestive of scar tissue or atelectasis adjacent small to moderate right-sided pleural effusion. No other rectal mass or opacity appreciated. Small left pleural effusion. Borderline enlarged mediastinal and hilar lymph nodes largest 1.1 cm by my measurement. No pulmonary emboli.  CARDIAC TTE (05/17/16): Moderate LVH with normal cavity size. EF 45-50% & grade 1 diastolic dysfunction. In coordinate septal motion. LA & RA normal in size. RV normal in size and function. No aortic stenosis or regurgitation. Aortic root normal in size. Trivial mitral regurgitation. Aneurysmal interatrial septum but cannot exclude PFO. No significant pulmonic regurgitation. Mild tricuspid regurgitation. No pericardial effusion.  MICROBIOLOGY 09/17/15 Hepatitis A IgM: Negative Hepatitis B surface antigen: Negative Hepatitis B core IgM: Negative Hepatitis C viral antibody: <0.1 HIV: Nonreactive    LABS 06/12/16 IgE: 60 RAST panel: Negative CBC: 10.9/15.8/46.4/233 Eosinophils: 0.4 Alpha one 08/01/16:   Level 99/ MZ   03/22/16 CBC: 11.7/15.1/45.3/224 BNP: 34.3  09/15/15 UDS:  Positive for benzodiazepines & THC         Assessment & Plan:  66 y.o. male with  severe COPD, allergic rhinitis, & tobacco use disorder. Patient continues to have symptoms from his seemingly seasonal allergic rhinitis. Recommended intranasal corticosteroid therapy to help with relief of symptoms. His COPD seems to be better controlled on his current inhaler. I am prescribing him a rescue inhaler and educated him on appropriate use. I do believe he would benefit from pulmonary rehabilitation to improve exercise tolerance and endurance. I instructed the patient contact my office if he had any new breathing problems or questions before next appointment.  4. Severe COPD:  Continuing patient on Trelegy inhaler. Prescribing a rescue inhaler today to use as needed. Screening for alpha-1 antitrypsin deficiency today. Repeating spirometry with bronchodilator challenge & 6 minute walk test on room air at next appointment. Referring to pulmonary rehabilitation.  5. Allergic rhinitis:  Continuing Zyrtec. Recommended trying Flonase again with appropriate technique.  6. Tobacco use disorder:  Patient missed phone call from screening coordinator. Reestablishing contact for lung cancer screening CT.  7. Health maintenance:  Discussed the  role of immunizations in preventing respiratory illnesses and further decreasing pulmonary function. Patient going to research Pneumovax and Prevnar vaccines.  8. Follow-up: Return to clinic in 3 months or sooner if needed.    Donna Christen Jamison Neighbor, M.D. [facility] Pulmonary & Critical Care Pager:  [phone] After 3pm or if no response, call [phone] 1:59 PM 08/01/16

## 2016-08-31 NOTE — Patient Instructions (Addendum)
Try prilosec otc 20mg   Take 30-60 min before first meal of the day and Pepcid ac (famotidine) 20 mg one @  bedtime until cough is completely gone for at least a week without the need for cough suppression  GERD (REFLUX)  is an extremely common cause of respiratory symptoms just like yours , many times with no obvious heartburn at all.    It can be treated with medication, but also with lifestyle changes including elevation of the head of your bed (ideally with 6 inch  bed blocks),  Smoking cessation, avoidance of late meals, excessive alcohol, and avoid fatty foods, chocolate, peppermint, colas, red wine, and acidic juices such as orange juice.  NO MINT OR MENTHOL PRODUCTS SO NO COUGH DROPS   USE SUGARLESS CANDY INSTEAD (Jolley ranchers or Stover's or Life Savers) or even ice chips will also do - the key is to swallow to prevent all throat clearing. NO OIL BASED VITAMINS - use powdered substitutes.    For cough > mucinex dm 1200 mg twice daily as needed and flutter valve when we get one we'll call to train you on it  Stop trelegy and start symbicort 80 Take 2 puffs first thing in am and then another 2 puffs about 12 hours later.   Return to see our NP or [name] Neighbor in 2 weeks to assess your response

## 2016-09-01 ENCOUNTER — Other Ambulatory Visit: Payer: Self-pay | Admitting: Internal Medicine

## 2016-09-01 ENCOUNTER — Encounter: Payer: Self-pay | Admitting: Internal Medicine

## 2016-09-01 DIAGNOSIS — R058 Other specified cough: Secondary | ICD-10-CM | POA: Insufficient documentation

## 2016-09-01 DIAGNOSIS — R05 Cough: Secondary | ICD-10-CM | POA: Insufficient documentation

## 2016-09-01 MED ORDER — FLUTTER DEVI: 0 refills | Status: DC

## 2016-09-01 NOTE — Assessment & Plan Note (Signed)
Try off breo and on short term gerd rx  08/31/2016   Upper airway cough syndrome (previously labeled PNDS) , is  so named because it's frequently impossible to sort out how much is  CR/sinusitis with freq throat clearing (which can be related to primary GERD)   vs  causing  secondary (" extra esophageal")  GERD from wide swings in gastric pressure that occur with throat clearing, often  promoting self use of mint and menthol lozenges that reduce the lower esophageal sphincter tone and exacerbate the problem further in a cyclical fashion.   These are the same pts (now being labeled as having "irritable larynx syndrome" by some cough centers) who not infrequently have a history of having failed to tolerate ace inhibitors,  dry powder inhalers or biphosphonates or report having atypical/extraesophageal reflux symptoms that don't respond to standard doses of PPI  and are easily confused as having aecopd or asthma flares by even experienced allergists/ pulmonologists (myself included).    rec change to hfa from dpi and rx with mucinex dm 1200 mg / gerd rx/ flutter valve prn    I had an extended discussion with the patient reviewing all relevant studies completed to date and  lasting 25 minutes of a 40  minute acute visit with pt not previously known to me     re  severe non-specific but potentially very serious refractory respiratory symptoms of uncertain and potentially multiple  etiologies.   Each maintenance medication was reviewed in detail including most importantly the difference between maintenance and prns and under what circumstances the prns are to be triggered using an action plan format that is not reflected in the computer generated alphabetically organized AVS.    Please see AVS for specific instructions unique to this office visit that I personally wrote and verbalized to the the pt in detail and then reviewed with pt  by my nurse highlighting any changes in therapy/plan of care  recommended  at today's visit.

## 2016-09-01 NOTE — Assessment & Plan Note (Addendum)
Alpha one 08/01/16:   Level 99/ MZ > carrier state, probably not relevant unless more evidence of rapid decline off cigs with today's flair not typical at all for copd cough    Actually this is well compensated, the problem is a cough with prominent upper airway features that developed while on Breo    - The proper method of use, as well as anticipated side effects, of a metered-dose inhaler are discussed and demonstrated to the patient. Improved effectiveness after extensive coaching during this visit to a level of approximately 75 % from a baseline of 50 % > try symbicort 80 2bid sample and f/u in 2 weeks

## 2016-09-05 ENCOUNTER — Telehealth: Payer: Self-pay | Admitting: Pulmonary Disease

## 2016-09-05 NOTE — Progress Notes (Signed)
Note reviewed.  Donna Christen Jamison Neighbor, M.D. [facility] Pulmonary & Critical Care Pager:  [phone] After 3pm or if no response, call [phone] 7:34 AM 09/05/16

## 2016-09-05 NOTE — Telephone Encounter (Signed)
Patient sister came and picked up flutter valve - nothing further needed -pr

## 2016-09-07 ENCOUNTER — Telehealth: Payer: Self-pay | Admitting: Pulmonary Disease

## 2016-09-07 NOTE — Telephone Encounter (Signed)
FYI  -  Spoke with pt to schedule SDMV and low dose CT.   Pt states he does not want to participate in lung screening at this time.  Referral has been cancelled.

## 2016-09-07 NOTE — Telephone Encounter (Signed)
Called and spoke to pt. Pt questioning how to use flutter valve. I instructed pt over the phone how to use the flutter valve, with pt read back, and informed him to use the device more with chest congestion and less frequently if he is experiencing little congestion. Pt verbalized understanding and denied any further questions or concerns at this time.

## 2016-09-07 NOTE — Telephone Encounter (Signed)
atc pt, unnamed man answered phone but connection was poor and we could not hear each other. atc back, line rang to fast busy signal.  Wcb.

## 2016-09-14 ENCOUNTER — Ambulatory Visit (INDEPENDENT_AMBULATORY_CARE_PROVIDER_SITE_OTHER): Payer: Medicare Other | Admitting: Acute Care

## 2016-09-14 ENCOUNTER — Encounter: Payer: Self-pay | Admitting: Acute Care

## 2016-09-14 DIAGNOSIS — R05 Cough: Secondary | ICD-10-CM | POA: Diagnosis not present

## 2016-09-14 DIAGNOSIS — J449 Chronic obstructive pulmonary disease, unspecified: Secondary | ICD-10-CM

## 2016-09-14 DIAGNOSIS — J309 Allergic rhinitis, unspecified: Secondary | ICD-10-CM

## 2016-09-14 DIAGNOSIS — R059 Cough, unspecified: Secondary | ICD-10-CM

## 2016-09-14 MED ORDER — BUDESONIDE-FORMOTEROL FUMARATE 80-4.5 MCG/ACT IN AERO: INHALATION_SPRAY | RESPIRATORY_TRACT | 3 refills | Status: DC

## 2016-09-14 MED ORDER — BUDESONIDE-FORMOTEROL FUMARATE 80-4.5 MCG/ACT IN AERO: INHALATION_SPRAY | RESPIRATORY_TRACT | 0 refills | Status: DC

## 2016-09-14 NOTE — Progress Notes (Signed)
History of Present Illness Reginald Hahn is a 66 y.o. male with Severe COPD,Chronic Rhinitis, & Tobacco Use Disorder. Marland Kitchen He is followed by Dr. Jamison Neighbor   09/14/2016 Follow Up Ov: Pt. Presents for follow up after seeing Dr. Sherene Sires 08/31/2016 for cough and mild COPD exacerbation. Dr. Thurston Hole plan after that visit was as follows:  Stop trilogy inhaler and start Symbicort 82 puffs twice daily For cough start Mucinex DM 1200 mg twice daily as needed and flutter valve 4 times a day 10 minutes Start Prilosec OTC 20 mg 3060 minutes before first meal of the day and Pepcid AC 20 mg one at bedtime     Patient presents for follow-up. He states that the Symbicort and PPI  has helped his cough. He states about 80% of the cough is gone. He is requesting change to Dr. Sherene Sires as his primary pulmonologist in the practice.. He has continued complaints of DOE and fatigue since starting the Symbicort..He has started using a flutter valve to help with secretion management.He is using it 4 times daily about 5 minutes at a time. He states he feels this is helping with secretion mobilization. He is frustrated with continued dyspnea. He does not want a HGB check today. He has deferred on Pulmonary rehab until he moves back to [place], which he thinks will be in August of this year. He is currently living in [facility]. Patient denies chest pain, fever, orthopnea, or hemoptysis. He denies any leg swelling or pain.  Test Results:  PFT 06/15/16: FVC 4.05 L (92%) FEV1 133 L (49%) FEV1/FVC 0.45  TLC 8.22 L (110%) RV 159% ERV 85% DLCO corrected 42%  06/15/16:  Walked 360 meters / Baseline Sat 94% on RA / Nadir Sat 88% (desat rapidly corrected)  IMAGING MAXILLOFACIAL CT LTD W/O 06/14/16 (per radiologist): No evidence of sinusitis.   .  CTA CHEST 09/16/15 (previously reviewed by me):Apical predominant emphysematous changes. Linear opacity right lung base suggestive of scar tissue or atelectasis adjacent  small to moderate right-sided pleural effusion. No other rectal mass or opacity appreciated. Small left pleural effusion. Borderline enlarged mediastinal and hilar lymph nodes largest 1.1 cm by my measurement.No pulmonary emboli.  CARDIAC TTE (05/17/16):Moderate LVH with normal cavity size. EF 45-50%&grade 1 diastolic dysfunction. In coordinate septal motion. LA &RA normal in size. RV normal in size and function. No aortic stenosis or regurgitation. Aortic root normal in size. Trivial mitral regurgitation. Aneurysmal interatrial septum but cannot exclude PFO. No significant pulmonic regurgitation. Mild tricuspid regurgitation. No pericardial effusion.  MICROBIOLOGY 09/17/15 Hepatitis A IgM: Negative Hepatitis B surface antigen: Negative Hepatitis B core IgM: Negative Hepatitis C viral antibody: <0.1 HIV: Nonreactive   LABS 06/12/16 IgE: 60 RAST panel: Negative CBC: 10.9/15.8/46.4/233 Eosinophils: 0.4 Alpha one 08/01/16:   Level 99/ MZ : Carrier state  03/22/16 CBC: 11.7/15.1/45.3/224 BNP: 34.3  09/15/15 UDS: Positive for benzodiazepines &THC   CBC Latest Ref Rng & Units 08/14/2016 06/12/2016 03/22/2016  WBC 4.0 - 10.5 K/uL 10.7(H) 10.9(H) 11.7(H)  Hemoglobin 13.0 - 17.0 g/dL 38.7 56.4 33.2  Hematocrit 39.0 - 52.0 % 48.4 46.4 45.3  Platelets 150 - 400 K/uL 237 233.0 224    BMP Latest Ref Rng & Units 08/14/2016 05/16/2016 03/22/2016  Glucose 65 - 99 mg/dL 89 94 86  BUN 6 - 20 mg/dL 16 12 10   Creatinine 0.61 - 1.24 mg/dL 9.51 8.84 1.66  BUN/Creat Ratio 10 - 24 - - -  Sodium 135 - 145 mmol/L 135 136  137  Potassium 3.5 - 5.1 mmol/L 4.4 4.4 4.6  Chloride 101 - 111 mmol/L 102 103 105  CO2 22 - 32 mmol/L 24 25 22   Calcium 8.9 - 10.3 mg/dL 9.2 9.3 9.2    BNP    Component Value Date/Time   BNP 34.3 03/22/2016 1625   PFT    Component Value Date/Time   FEV1PRE 1.83 06/15/2016 1352   FEV1POST 1.88 06/15/2016 1352   FVCPRE 4.05 06/15/2016 1352   FVCPOST 4.75 06/15/2016 1352    TLC 8.22 06/15/2016 1352   DLCOUNC 15.26 06/15/2016 1352   PREFEV1FVCRT 45 06/15/2016 1352   PSTFEV1FVCRT 40 06/15/2016 1352    Dg Chest 2 View  Result Date: 08/25/2016 CLINICAL DATA:  Cough, dyspnea. EXAM: CHEST  2 VIEW COMPARISON:  Radiographs of June 12, 2016. FINDINGS: Stable cardiomediastinal silhouette. No pneumothorax or significant pleural effusion is noted. Right lung is clear. Mild left basilar atelectasis or scarring is noted. Bony thorax is unremarkable. IMPRESSION: Stable mild left basilar subsegmental atelectasis or scarring. Electronically Signed   By: Lupita Raider, M.D.   On: 08/25/2016 10:18     Past medical hx Past Medical History:  Diagnosis Date  . Acute combined systolic and diastolic heart failure (HCC) 09/16/2015  . Allergic rhinitis   . Allergy    seasonal allergies  . Arthritis   . Black widow spider bite   . Stroke (cerebrum) (HCC)   . Tobacco use    x40 years. 1 ppd     Social History  Substance Use Topics  . Smoking status: Former Smoker    Packs/day: 2.00    Years: 50.00    Types: Cigarettes    Start date: 04/14/1963    Quit date: 10/09/2015  . Smokeless tobacco: Never Used     Comment: stopped for a couple of years in his 30s  . Alcohol use No    Tobacco Cessation: 100 pack year smoking history, quit in 2017  Past surgical hx, Family hx, Social hx all reviewed.  Current Outpatient Prescriptions on File Prior to Visit  Medication Sig  . albuterol (PROVENTIL HFA;VENTOLIN HFA) 108 (90 Base) MCG/ACT inhaler Inhale 2 puffs into the lungs every 4 (four) hours as needed for wheezing or shortness of breath.  . cetirizine (ZYRTEC) 10 MG tablet Take 10 mg by mouth daily.  Marland Kitchen losartan (COZAAR) 25 MG tablet Take 25 mg by mouth daily.   . potassium chloride SA (K-DUR,KLOR-CON) 20 MEQ tablet Take 1 tablet (20 mEq total) by mouth daily.  Marland Kitchen Respiratory Therapy Supplies (FLUTTER) DEVI Use as directed  . spironolactone (ALDACTONE) 25 MG tablet Take 1 tablet  (25 mg total) by mouth daily.   No current facility-administered medications on file prior to visit.      Allergies  Allergen Reactions  . Codeine Itching    whelps  . Other     Seasonal allergies, "all spices, salt, pepper"    Review Of Systems:  Constitutional:   No  weight loss, night sweats,  Fevers, chills,+ fatigue, or  lassitude.  HEENT:   No headaches,  Difficulty swallowing,  Tooth/dental problems, or  Sore throat,                No sneezing, itching, ear ache, +nasal congestion, +post nasal drip,   CV:  No chest pain,  Orthopnea, PND, swelling in lower extremities, anasarca, dizziness, palpitations, syncope.   GI  No heartburn, indigestion, abdominal pain, nausea, vomiting, diarrhea, change in bowel habits, loss  of appetite, bloody stools.   Resp: + shortness of breath with exertion less at rest.  + excess mucus which is his baseline, no productive cough,  No non-productive cough,  No coughing up of blood.  No change in color of mucus.  Occasional wheezing.  No chest wall deformity  Skin: no rash or lesions., No tattoos or piercings noted, skin is intact clean and dry  GU: no dysuria, change in color of urine, no urgency or frequency.  No flank pain, no hematuria   MS:  No joint pain or swelling.  No decreased range of motion.  No back pain.  Psych:  No change in mood or affect. No depression or anxiety.  No memory loss.   Vital Signs BP 128/78 (BP Location: Left Arm, Cuff Size: Normal)   Pulse 77   Ht 6' (1.829 m)   Wt 200 lb 3.2 oz (90.8 kg)   SpO2 92%   BMI 27.15 kg/m    Physical Exam:  Body mass index is 27.15 kg/m.   General- No distress,  A&Ox3, pleasant affect, multiple questions ENT: No sinus tenderness, TM clear, pale nasal mucosa, no oral exudate,+ post nasal drip, no LAN Cardiac: S1, S2, regular rate and rhythm, no murmur Chest: No wheeze/ rales/ dullness; no accessory muscle use, no nasal flaring, no sternal retractions, diminished per  bases Abd.: Soft Non-tender, nondistended, bowel sounds positive Ext: No clubbing cyanosis, edema Neuro:  normal strength Skin: No rashes, warm and dry, no piercings or tattoos noted, warm dry and intact Psych: normal mood and behavior   Assessment/Plan  COPD, severe (HCC) We will request a switch in doctors to Dr. Sherene Sires.( Will keep appointment with [facility] for 10/31/2016 and switch after) We will send in a prescription for Symbicort.( [facility] in [place]) Continue using 2 puffs 2 times daily. Rinse mouth after use. We will give you a sample of Symbicort. Continue Prilosec in the mornings, and Pepcid at night per Dr. Thurston Hole instructions Follow up with Dr. Jamison Neighbor 7/24, after this appointment switch to Dr. Sherene Sires Re-assess Pulmonary Rehab based on when patient moves back to [facility]. Please contact office for sooner follow up if symptoms do not improve or worsen or seek emergency care    Cough Significant improvement after discontinuing trilogy and starting Symbicort 2 puffs twice a day Compliant with Prilosec OTC 20 mg 30-60 minutes prior to first meal of the day and Pepcid before meals 20 mg once at bedtime Plan Continue Symbicort, and Prilosec as directed  Allergic rhinitis Continue Zyrtec and Flonase    Bevelyn Ngo, NP 09/14/2016  6:41 PM

## 2016-09-14 NOTE — Assessment & Plan Note (Signed)
Continue Zyrtec and Flonase.

## 2016-09-14 NOTE — Patient Instructions (Addendum)
It is good to see you today.  We will request a switch in doctors to Dr. Sherene Sires.( Will keep appointment with [facility] for 10/31/2016 and switch after) We will send in a prescription for Symbicort.( [facility] in [place]) Continue using 2 puffs 2 times daily. Rinse mouth after use. We will give you a sample of Symbicort. Follow up with Dr. Jamison Neighbor 7/24, with 6 minute walk and PFTs as are scheduled Re-assess Pulmonary Rehab based on when patient moves back to [place]. Please contact office for sooner follow up if symptoms do not improve or worsen or seek emergency care

## 2016-09-14 NOTE — Assessment & Plan Note (Signed)
We will request a switch in doctors to Dr. Sherene Sires.( Will keep appointment with [facility] for 10/31/2016 and switch after) We will send in a prescription for Symbicort.( [facility] in [place]) Continue using 2 puffs 2 times daily. Rinse mouth after use. We will give you a sample of Symbicort. Continue Prilosec in the mornings, and Pepcid at night per Dr. Thurston Hole instructions Follow up with Dr. Jamison Neighbor 7/24, after this appointment switch to Dr. Sherene Sires Re-assess Pulmonary Rehab based on when patient moves back to [facility]. Please contact office for sooner follow up if symptoms do not improve or worsen or seek emergency care

## 2016-09-14 NOTE — Assessment & Plan Note (Signed)
Significant improvement after discontinuing trilogy and starting Symbicort 2 puffs twice a day Compliant with Prilosec OTC 20 mg 30-60 minutes prior to first meal of the day and Pepcid before meals 20 mg once at bedtime Plan Continue Symbicort, and Prilosec as directed

## 2016-09-14 NOTE — Progress Notes (Signed)
Chart and office note reviewed in detail  > agree with a/p as outlined    

## 2016-09-15 NOTE — Progress Notes (Signed)
Note reviewed.  Donna Christen Jamison Neighbor, M.D. [facility] Pulmonary & Critical Care Pager:  [phone] After 3pm or if no response, call [phone] 7:22 AM 09/15/16

## 2016-09-19 ENCOUNTER — Telehealth: Payer: Self-pay | Admitting: Pulmonary Disease

## 2016-09-19 NOTE — Telephone Encounter (Signed)
Spoke with pt we had an open appt with Veleta Miners tomorrow morning at 9am. Pt wanted this appt and assured that he would be ok to wait to be seen. Pt declined offer to send message to physician for medication to be called in. Pt is c/o increase sob, says he finds it hard sometimes to catch his breath, he is coughing but not much phlegm, low energy Denies wheezing. Pt states he would like to also see if he possible need oxygen says at times he thinks his oxygen level may drop. He had no further questions at this time. Appt was made. Nothing further is needed

## 2016-09-20 ENCOUNTER — Encounter: Payer: Self-pay | Admitting: Pulmonary Disease

## 2016-09-20 ENCOUNTER — Ambulatory Visit (INDEPENDENT_AMBULATORY_CARE_PROVIDER_SITE_OTHER): Payer: Medicare Other | Admitting: Pulmonary Disease

## 2016-09-20 VITALS — BP 140/78 | HR 77 | Ht 72.0 in | Wt 201.6 lb

## 2016-09-20 DIAGNOSIS — J309 Allergic rhinitis, unspecified: Secondary | ICD-10-CM

## 2016-09-20 DIAGNOSIS — J449 Chronic obstructive pulmonary disease, unspecified: Secondary | ICD-10-CM

## 2016-09-20 DIAGNOSIS — R05 Cough: Secondary | ICD-10-CM

## 2016-09-20 DIAGNOSIS — R059 Cough, unspecified: Secondary | ICD-10-CM

## 2016-09-20 MED ORDER — FLUTICASONE PROPIONATE 50 MCG/ACT NA SUSP: 2.0000 | Freq: Every day | NASAL | 2 refills | Status: DC

## 2016-09-20 MED ORDER — LEVALBUTEROL HCL 0.63 MG/3ML IN NEBU
0.6300 mg | INHALATION_SOLUTION | Freq: Once | RESPIRATORY_TRACT | Status: AC
  Administered 2016-09-20: 0.63 mg via RESPIRATORY_TRACT

## 2016-09-20 NOTE — Progress Notes (Signed)
[facility]   Chief Complaint  Patient presents with  . Acute Visit    saw SG 09/14/2016, SOB has gotten worse after working for 2 days, had to use rescue inhaler, coughing at night more     Primary Pulmonologist: Dr. Jamison Neighbor / Sherene Sires   Current Outpatient Prescriptions on File Prior to Visit  Medication Sig  . albuterol (PROVENTIL HFA;VENTOLIN HFA) 108 (90 Base) MCG/ACT inhaler Inhale 2 puffs into the lungs every 4 (four) hours as needed for wheezing or shortness of breath.  . budesonide-formoterol (SYMBICORT) 80-4.5 MCG/ACT inhaler Take 2 puffs first thing in am and then another 2 puffs about 12 hours later.  . cetirizine (ZYRTEC) 10 MG tablet Take 10 mg by mouth daily.  Marland Kitchen losartan (COZAAR) 25 MG tablet Take 25 mg by mouth daily.   . potassium chloride SA (K-DUR,KLOR-CON) 20 MEQ tablet Take 1 tablet (20 mEq total) by mouth daily.  Marland Kitchen Respiratory Therapy Supplies (FLUTTER) DEVI Use as directed  . spironolactone (ALDACTONE) 25 MG tablet Take 1 tablet (25 mg total) by mouth daily.   No current facility-administered medications on file prior to visit.      Studies: LHC 08/29/16 >> LVEF 25%, NICM of unclear etiology    Past Medical Hx:  has a past medical history of Acute combined systolic and diastolic heart failure (HCC) (09/16/879); Allergic rhinitis; Allergy; Arthritis; Black widow spider bite; Stroke (cerebrum) (HCC); and Tobacco use.   Past Surgical hx, Allergies, Family hx, Social hx all reviewed.  Vital Signs BP 140/78 (BP Location: Left Arm, Cuff Size: Normal)   Pulse 77   Ht 6' (1.829 m)   Wt 201 lb 9.6 oz (91.4 kg)   SpO2 92%   BMI 27.34 kg/m   History of Present Illness Reginald Hahn is a 66 y.o. male, former smoker (2ppd at heaviest, began smoking age 42, quit in 2017), with a history of AF (in sinus rhythm in office 6/13), HTN, LBBB, chronic systolic CHF, NICM (LVEF 25% by LHC), allergic rhinitis and severe COPD who presented to the pulmonary office for reports  of increased shortness of breath.      He reports he lives in [place], [place] and has noted an increase in shortness of breath over the past two weeks.  He didn't feel like getting out of bed.  He denies fever, chills, regular sputum production.  He reports he has noticed an increase in his cough as well.  He has been taking Prilosec QD and Pepcid QHS for GERD (working up cough).  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  He had an episode where he had to use 6 puffs of his albuterol to recover from episode of SOB.    Of note, he states he can not take prednisone or Mucinex DM as it makes his heart race and makes him "crazy".    Physical Exam  General - well developed adult M in no acute distress ENT - No sinus tenderness, no oral exudate, no LAN Cardiac - s1s2 regular, no murmur Chest - even/non-labored, lungs bilaterally with wheezing  Back - No focal tenderness Abd - Soft, non-tender Ext - No edema Neuro - Normal strength Skin - No rashes Psych - normal mood, and behavior   Assessment/Plan  Discussion:  Seen for shortness of breath.  He as multiple concerns to include cough, dyspnea with exertion and possible application for disability.  Recent LHC with NICM and LVEF of 25%.  1.  Severe COPD   Plan: Begin Spiriva  Follow up with Dr. Jamison Neighbor for PFT evaluation Continue Symbicort & PRN albuterol  Reviewed / recommended pulmonary rehab for patient > would like approval from CHF service given NICM   2.  Allergic Rhinitis   Plan: Begin flonase > asked the patient to monitor cough symptoms with use of flonase  3.  NICM - LVEF 25% on LHC in May  Plan: Follow up with Dr. Gala Romney as planned  ?? If he needs beta blocker (if needed, consider bisoprolol with pulmonary disease)    Patient Instructions  1.  We assessed your walking saturations today in office.  They fell to 88% with exertion.  We will arrange for home Oxygen at 2L with exertion (moving  around, physical activity etc).    2.  Follow up with Dr. Jamison Neighbor for PFT evaluation  3.  Use flonase twice daily > stay on this until you see Dr. Jamison Neighbor to review symptom relief   4.  Continue your Prilosec & Pepcid for acid reflux  5.  Consider attending Pulmonary Rehab  6.  Giving you a trial of Spiriva > please rinse your mouth after each use, 2 puffs once daily  7.  Keep your appointment with Dr. Gala Romney on 6/26 at 2:20PM (very important)!  8.  Call the office or go to the ER immediately if new or worsening symptoms.      [name], NP-C [facility]  [phone] 09/20/2016, 12:07 PM

## 2016-09-20 NOTE — Patient Instructions (Addendum)
1.  We assessed your walking saturations today in office.  They fell to 88% with exertion.  We will arrange for home Oxygen at 2L with exertion (moving around, physical activity etc).    2.  Follow up with Dr. Jamison Neighbor for PFT evaluation  3.  Use flonase twice daily > stay on this until you see Dr. Jamison Neighbor to review symptom relief   4.  Continue your Prilosec & Pepcid for acid reflux  5.  Consider attending Pulmonary Rehab  6.  Giving you a trial of Spiriva > please rinse your mouth after each use, 2 puffs once daily  7.  Keep your appointment with Dr. Gala Romney on 6/26 at 2:20PM (very important)!  8.  Call the office or go to the ER immediately if new or worsening symptoms.

## 2016-09-20 NOTE — Progress Notes (Signed)
Note reviewed.  Donna Christen Jamison Neighbor, M.D. [facility] Pager:  [phone] After 3pm or if no response, call [phone] 11:03 AM 09/20/16

## 2016-09-25 ENCOUNTER — Telehealth (HOSPITAL_COMMUNITY): Payer: Self-pay

## 2016-09-25 NOTE — Telephone Encounter (Addendum)
Wife calling c/o patient "declinig rapidly" since his cath. States he saw his pulm doctor and was put on continuous O2 and reported being grey at the time, but O2 has made his color better. Wife reports he is only able to take 10 steps before he is unable to walk any further due to fatigue. Stays in his bed, low appetite, weight stable, no other s/s to report.  Denies cough, fever, chills, GI s/s. Wife checking vitals and state everything seems stable. 91% on 2L continuous, 85% off O2.  Has not checked him during ambulation due to his fatigue and weakness and only being able to step max 10 steps before needing rest. Will add pt on to see Dr. Gala Romney tomorrow. Advised if patient becomes worse to call 911. Will forward info to Dr. Gala Romney.  Ave Filter, RN

## 2016-09-26 ENCOUNTER — Encounter (HOSPITAL_COMMUNITY): Payer: Medicare Other | Admitting: Internal Medicine

## 2016-09-26 ENCOUNTER — Encounter (HOSPITAL_COMMUNITY): Payer: Self-pay | Admitting: Internal Medicine

## 2016-09-26 ENCOUNTER — Ambulatory Visit (HOSPITAL_COMMUNITY)
Admission: RE | Admit: 2016-09-26 | Discharge: 2016-09-26 | Disposition: A | Discharge status: Home / Self Care | Payer: Medicare Other | Source: Ambulatory Visit | Attending: Internal Medicine | Admitting: Internal Medicine

## 2016-09-26 VITALS — BP 136/94 | HR 76 | Wt 203.8 lb

## 2016-09-26 DIAGNOSIS — Z87891 Personal history of nicotine dependence: Secondary | ICD-10-CM | POA: Diagnosis not present

## 2016-09-26 DIAGNOSIS — J449 Chronic obstructive pulmonary disease, unspecified: Secondary | ICD-10-CM | POA: Diagnosis not present

## 2016-09-26 DIAGNOSIS — I480 Paroxysmal atrial fibrillation: Secondary | ICD-10-CM | POA: Diagnosis not present

## 2016-09-26 DIAGNOSIS — I5022 Chronic systolic (congestive) heart failure: Secondary | ICD-10-CM

## 2016-09-26 DIAGNOSIS — I429 Cardiomyopathy, unspecified: Secondary | ICD-10-CM | POA: Insufficient documentation

## 2016-09-26 DIAGNOSIS — R0602 Shortness of breath: Secondary | ICD-10-CM

## 2016-09-26 DIAGNOSIS — Z79899 Other long term (current) drug therapy: Secondary | ICD-10-CM | POA: Insufficient documentation

## 2016-09-26 DIAGNOSIS — I447 Left bundle-branch block, unspecified: Secondary | ICD-10-CM | POA: Insufficient documentation

## 2016-09-26 DIAGNOSIS — I110 Hypertensive heart disease with heart failure: Secondary | ICD-10-CM | POA: Diagnosis not present

## 2016-09-26 DIAGNOSIS — I5042 Chronic combined systolic (congestive) and diastolic (congestive) heart failure: Secondary | ICD-10-CM | POA: Insufficient documentation

## 2016-09-26 DIAGNOSIS — I2510 Atherosclerotic heart disease of native coronary artery without angina pectoris: Secondary | ICD-10-CM | POA: Diagnosis not present

## 2016-09-26 MED ORDER — FUROSEMIDE 40 MG PO TABS: 40.0000 mg | ORAL_TABLET | ORAL | 3 refills | Status: DC | PRN

## 2016-09-26 MED ORDER — PREDNISONE 10 MG (21) PO TBPK: ORAL_TABLET | ORAL | 0 refills | Status: DC

## 2016-09-26 NOTE — Progress Notes (Signed)
Advanced Heart Failure Clinic Note   Patient ID: Reginald Hahn, male   DOB: 1950-08-05, 66 y.o.   MRN: [id number] Primary Cardiologist: Dr. Gala Hahn   HPI: [name] "Reginald Hahn" is a 66 y.o. male with h/o COPD admitted in 6/17 with acute systolic HF, AF with RVR and cardiogenic shock. LBBB  Patient was admitted 6/17 with acute systolic HF and new onset afib with RVR and EF found to be 15%.Felt to to have viral CM exacerbated by afib. Diuresed slowly so PICC placed and co-ox low. Started on IV amio and milrinone and subsequently diuresed well. Was pending cath and TEE/DC-CV but developed massive RP bleed and was transferred to ICU.   All anti-coagulants held. Received multiple transfusions. Subsequently recovered. During that time also had loss of peripheral vision in R eye. Head CT negative. Pre discharge had Myoview with EF 30-44%.  Inferior infarct vs gut attenuation. No ischemia.    Pulmonary Clinic with Dr. Jamison Hahn and told he has severe COPD FEV1 1.83 (49%) FEF 25-75% 0.60L  DLCO 42%.   Today he returns for HF follow up. Overall feeling awful. SOB with exertion. Just started on oxygen a week ago. Saw pulmonary last week and he was started on Spiriva.    Myoview 6/17  There was no ST segment deviation noted during stress.  The left ventricular ejection fraction is moderately decreased (30-44%).  Findings consistent with prior myocardial infarction.  This is an intermediate risk study.  nferior/inferolateral defect (moderate) that does not improve in the rest images consistent with scar and possible soft tissue attenuation. No significant ischemia.  RHC/LHC 08/29/2016 RA = 6 RV = 40/8 PA = 40/13 (24) PCW = 19 Ao = 104/82 (93) LV = 141/17 Fick cardiac output/index = 4.8/2.3 PVR = 1.0 WU SVR 1437  Ao sat = 90% PA sat = 62%, 63% Assessment: 1. Minimal non-obstructive CAD 2. Severe nonischemic CM with EF 25% by cath (EF likely underestimated on re-look likely  40%) 3. Relatively well compensated filling pressures with mild pulmonary venous HTN   Echo 8/17 EF 45% Echo 2/18 EF 45-50%  Review of systems complete and found to be negative unless listed in HPI.    Past Medical History:  Diagnosis Date  . Acute combined systolic and diastolic heart failure (HCC) 09/16/2015  . Allergic rhinitis   . Allergy    seasonal allergies  . Arthritis   . Black widow spider bite   . Stroke (cerebrum) (HCC)   . Tobacco use    x40 years. 1 ppd   SH:  Social History   Social History  . Marital status: Divorced    Spouse name: N/A  . Number of children: N/A  . Years of education: N/A   Occupational History  . Not on file.   Social History Main Topics  . Smoking status: Former Smoker    Packs/day: 2.00    Years: 50.00    Types: Cigarettes    Start date: 04/14/1963    Quit date: 10/09/2015  . Smokeless tobacco: Never Used     Comment: stopped for a couple of years in his 30s  . Alcohol use No  . Drug use: No     Comment: Used to smoke marijuana  . Sexual activity: Not on file   Other Topics Concern  . Not on file   Social History Narrative   Lives alone   2 daughters, ex-wife lives in [place]   Owns a plumbing company   Deafness in the  setting of loud machinery      [facility] (06/12/16):   Originally from [place] [facility] [facility]. 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 No pets currently. Remote exposure to a parrot for 2 years.     FH:  Family History  Problem Relation Age of Onset  . Heart disease Mother        died in her 90's.  . Diabetes Mother   . Prostate cancer Father        died in his 62's.  . Congenital heart disease Brother        died @ age 34.  . Lung disease Neg Hx     Past Medical History:  Diagnosis Date  . Acute combined systolic and diastolic heart failure (HCC) 09/16/2015  . Allergic rhinitis   . Allergy    seasonal allergies  . Arthritis   . Black widow spider bite   .  Stroke (cerebrum) (HCC)   . Tobacco use    x40 years. 1 ppd    Current Outpatient Prescriptions  Medication Sig Dispense Refill  . albuterol (PROVENTIL HFA;VENTOLIN HFA) 108 (90 Base) MCG/ACT inhaler Inhale 2 puffs into the lungs every 4 (four) hours as needed for wheezing or shortness of breath. 1 Inhaler 3  . budesonide-formoterol (SYMBICORT) 80-4.5 MCG/ACT inhaler Take 2 puffs first thing in am and then another 2 puffs about 12 hours later. 1 Inhaler 0  . cetirizine (ZYRTEC) 10 MG tablet Take 10 mg by mouth daily.    Marland Kitchen losartan (COZAAR) 25 MG tablet Take 25 mg by mouth daily.     . potassium chloride SA (K-DUR,KLOR-CON) 20 MEQ tablet Take 1 tablet (20 mEq total) by mouth daily. 30 tablet 5  . Respiratory Therapy Supplies (FLUTTER) DEVI Use as directed 1 each 0  . spironolactone (ALDACTONE) 25 MG tablet Take 1 tablet (25 mg total) by mouth daily. 90 tablet 2  . tiotropium (SPIRIVA) 18 MCG inhalation capsule Place 18 mcg into inhaler and inhale daily.     No current facility-administered medications for this encounter.     Vitals:   09/26/16 0952  BP: (!) 136/94  Pulse: 76  SpO2: 95%  Weight: 203 lb 12 oz (92.4 kg)   Wt Readings from Last 3 Encounters:  09/26/16 203 lb 12 oz (92.4 kg)  09/20/16 201 lb 9.6 oz (91.4 kg)  09/14/16 200 lb 3.2 oz (90.8 kg)     PHYSICAL EXAM: General: NAD. Walked slowly in the clinic on 2 liters oxygen.  HEENT: normal Neck: supple. JVP flat 6-7. Carotids 2+ bilat; no bruits. No thyromegaly or nodule noted. Cor: PMI nondisplaced. RRR. M/G/R Lungs: Decreased in the bases on 2 liters oxygen  Abdomen: soft, non tender, nondistended. no HSM. No bruits or masses. +BS  Extremities: no cyanosis, clubbing, rash, No lower extremity edema.   Neuro: A7O x3. MAE. Affect pleasant.     ASSESSMENT & PLAN:  1. 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 ~35-40% by Myoview. Likely due to tachy CM in setting of rapid AF but Myoview  also suggestive of CAD.  --Echo 2/18 EF 45-50% - NYHA IIIB. RHC/LHC results reviewed and discussed Volume status stable. Does not need lasix.  Continue current dose of losartan and spiro.   - Continue spiro 25 mg daily.  Portable  ECHO~40-45% per Dr Reginald Hahn 2. PAF with RVR - Off amiodarone. Regular pulse.   - Has refused AC due to  h/o RP bleed. If he goes back in A Fib will need tikosyn  - This patients CHA2DS2-VASc Score and unadjusted Ischemic Stroke Rate (% per year) is equal to 5 (HTN, CHF, age, CVA) 3. h/o RP bleed - Resolved. No further.  4. Tobacco use - Not smoking.  5. Transient loss of R peripheral vision - Concerning for embolic CVA in setting of AF - Pt declines Neuro referal, MRI, and/or carotid dopplers.  6. HTN - Mildly elevated on meds as above.  7. LBBB - QRS 134 on EKG today.   . Follow up  Tonye Becket, NP  10:03 AM  Patient seen and examined with Tonye Becket, NP. We discussed all aspects of the encounter. I agree with the assessment and plan as stated above.   Cath films and RHC reviewed. Echo performed personally at bedside. EF 40-45%. Volume status looks ok. Suspect symptoms mostly due to COPD. Will give prednisone taper and see how he responds. Can take extra lasix as needed.   No further AF. We discussed possible starting Tikosyn when more stable.   Arvilla Meres, MD  10:58 AM

## 2016-09-26 NOTE — Patient Instructions (Addendum)
Prednisone pack has been ordered for you, please follow the directions on the packet.   START taking Lasix 40 mg (1 Tablet) as needed for volume overload  You have been referred to Pulmonary Rehab, they will contact you to schedule appointment.   Follow up in 2 Months

## 2016-09-29 ENCOUNTER — Telehealth: Payer: Self-pay | Admitting: Pulmonary Disease

## 2016-09-29 DIAGNOSIS — J449 Chronic obstructive pulmonary disease, unspecified: Secondary | ICD-10-CM

## 2016-09-29 NOTE — Telephone Encounter (Signed)
Spoke with Marylu Lund (dpr on file), states that pt wishes to enroll in pulm rehab. JN ok to place order for pulm rehab?  If so, please specify dx.  Thanks!

## 2016-09-29 NOTE — Telephone Encounter (Signed)
Go ahead. Diagnosis is COPD.

## 2016-10-02 NOTE — Telephone Encounter (Signed)
Left a message for patient's wife to call back. 

## 2016-10-03 ENCOUNTER — Encounter (HOSPITAL_COMMUNITY): Payer: Medicare Other | Admitting: Internal Medicine

## 2016-10-03 NOTE — Telephone Encounter (Signed)
Spoke with patient's wife. She verbalized understanding. States they wish to go to the rehab at [facility]. Will place order. Nothing else was needed at time of call.

## 2016-10-03 NOTE — Telephone Encounter (Signed)
Patient returning call - he can be reached at [phone] -pr

## 2016-10-03 NOTE — Telephone Encounter (Signed)
lmtcb x2 for Reginald Hahn.

## 2016-10-09 ENCOUNTER — Telehealth: Payer: Self-pay | Admitting: Pulmonary Disease

## 2016-10-09 DIAGNOSIS — J439 Emphysema, unspecified: Secondary | ICD-10-CM

## 2016-10-09 MED ORDER — PREDNISONE 10 MG (21) PO TBPK: ORAL_TABLET | ORAL | 0 refills | Status: DC

## 2016-10-09 NOTE — Telephone Encounter (Signed)
Ok to refill his recent prednisone taper. He needs early office f/u here

## 2016-10-09 NOTE — Telephone Encounter (Signed)
Spoke with pt, requesting a pred taper- was put on a pred taper by cards X2 weeks ago after declining prednisone from [place].  Now, pt is starting to have increased sob, chest congestion, mostly nonprod cough since finishing pred taper Xfew days ago.  Denies fever, chest pain.  O2 levels are wnl.    [place] for an OV (no appts available in clinic this week), and is requesting a refill on pred taper.    Pt would like rx sent to [facility] in [place]  Last ov: 09/20/16 with Brandi Next ov: 10/31/16 with JN  Sending to DOD as Paula Libra is off this week. CY please advise on pred taper request.  Thanks!

## 2016-10-09 NOTE — Telephone Encounter (Signed)
ATC, rehab closed [facility] 7/3

## 2016-10-09 NOTE — Telephone Encounter (Signed)
Spoke with pt, aware of recs.  rx sent to preferred pharmacy.  States he will call back for appt.  Nothing further needed at this time.

## 2016-10-10 NOTE — Telephone Encounter (Signed)
Called and lmomtcb x 1 for someone in pulmonary rehab to call us back.

## 2016-10-10 NOTE — Telephone Encounter (Signed)
Spoke with Molly at [facility], states that pt was referred for COPD.  Kirt Boys states that with this dx, pt's out of pocket expense will be very expensive, but if we change the dx to emphysema it will be significantly cheaper for the patient.    Also, please note pt is already scheduled to start rehab orientation on 10/23/16.    JN please advise if ok to switch dx to emphysema or chronic bronchitis.  Thanks!

## 2016-10-12 NOTE — Telephone Encounter (Signed)
Spoke with Product/process development scientist at [facility]. She is aware of JN's message. New order has been placed with the emphysema diagnosis. Nothing further needed at time of call.

## 2016-10-12 NOTE — Telephone Encounter (Signed)
That is fine. He does indeed have emphysema on his chest CT scan from June. J.

## 2016-10-12 NOTE — Telephone Encounter (Signed)
JN please advise if ok to change the dx on the pulmonary rehab order. Thanks

## 2016-10-23 ENCOUNTER — Ambulatory Visit (HOSPITAL_COMMUNITY): Payer: Medicare Other

## 2016-10-30 ENCOUNTER — Ambulatory Visit: Payer: Medicare Other

## 2016-10-30 ENCOUNTER — Ambulatory Visit (INDEPENDENT_AMBULATORY_CARE_PROVIDER_SITE_OTHER): Payer: Medicare Other | Admitting: *Deleted

## 2016-10-30 DIAGNOSIS — J449 Chronic obstructive pulmonary disease, unspecified: Secondary | ICD-10-CM | POA: Diagnosis not present

## 2016-10-30 NOTE — Progress Notes (Signed)
SIX MIN WALK 10/30/2016 09/20/2016 06/15/2016  Medications symbicort 80- 2 puffs taken approx 8:00.  - amiodarone, losartan, potassium, aldactone taken at 8:30 am today  Supplimental Oxygen during Test? (L/min) No No No  Laps 8 - 7  Partial Lap (in Meters) 0 - 24  Baseline BP (sitting) 134/68 - 130/76  Baseline Heartrate 91 - 64  Baseline Dyspnea (Borg Scale) 1 - 4  Baseline Fatigue (Borg Scale) 1 - 0  Baseline SPO2 99 - 94  BP (sitting) 148/88 - 140/90  Heartrate 106 - 72  Dyspnea (Borg Scale) 4 - 4  Fatigue (Borg Scale) 0 - 0  SPO2 97 - 92  BP (sitting) 138/76 - 138/74  Heartrate 92 - 69  SPO2 99 - 95  Stopped or Paused before Six Minutes No - No  Interpretation Hip pain - -  Distance Completed 384 - 360  Tech Comments: pt walked a moderately fast pace, tolerated walk well.  noted some R hip pain during walk.  steady walk but dropped down to 88 on 2.5 lap pt vitals checked after the 6 min walk was completed.  oxygen level was 88% on room air but then quickly jumped to 92 %.  pt did not have any complaints at any time during this test.

## 2016-10-31 ENCOUNTER — Ambulatory Visit: Payer: Medicare Other

## 2016-10-31 ENCOUNTER — Encounter: Payer: Self-pay | Admitting: Pulmonary Disease

## 2016-10-31 ENCOUNTER — Ambulatory Visit (INDEPENDENT_AMBULATORY_CARE_PROVIDER_SITE_OTHER): Payer: Medicare Other | Admitting: Pulmonary Disease

## 2016-10-31 VITALS — BP 142/90 | HR 84 | Ht 72.0 in | Wt 211.0 lb

## 2016-10-31 DIAGNOSIS — J309 Allergic rhinitis, unspecified: Secondary | ICD-10-CM

## 2016-10-31 DIAGNOSIS — J449 Chronic obstructive pulmonary disease, unspecified: Secondary | ICD-10-CM

## 2016-10-31 DIAGNOSIS — E8801 Alpha-1-antitrypsin deficiency: Secondary | ICD-10-CM

## 2016-10-31 DIAGNOSIS — Z148 Genetic carrier of other disease: Secondary | ICD-10-CM | POA: Insufficient documentation

## 2016-10-31 LAB — PULMONARY FUNCTION TEST
FEF 25-75 POST: 1.64 L/s
FEF 25-75 Pre: 0.68 L/sec
FEF2575-%CHANGE-POST: 139 %
FEF2575-%PRED-PRE: 23 %
FEF2575-%Pred-Post: 56 %
FEV1-%CHANGE-POST: 36 %
FEV1-%Pred-Post: 64 %
FEV1-%Pred-Pre: 46 %
FEV1-Post: 2.36 L
FEV1-Pre: 1.73 L
FEV1FVC-%CHANGE-POST: 12 %
FEV1FVC-%Pred-Pre: 68 %
FEV6-%Change-Post: 26 %
FEV6-%PRED-PRE: 67 %
FEV6-%Pred-Post: 85 %
FEV6-Post: 3.98 L
FEV6-Pre: 3.15 L
FEV6FVC-%Change-Post: 4 %
FEV6FVC-%Pred-Post: 103 %
FEV6FVC-%Pred-Pre: 99 %
FVC-%Change-Post: 21 %
FVC-%PRED-PRE: 67 %
FVC-%Pred-Post: 82 %
FVC-POST: 4.06 L
FVC-PRE: 3.34 L
POST FEV6/FVC RATIO: 98 %
PRE FEV1/FVC RATIO: 52 %
Post FEV1/FVC ratio: 58 %
Pre FEV6/FVC Ratio: 94 %

## 2016-10-31 MED ORDER — TIOTROPIUM BROMIDE MONOHYDRATE 2.5 MCG/ACT IN AERS: 2.0000 | INHALATION_SPRAY | Freq: Every day | RESPIRATORY_TRACT | 3 refills | Status: DC

## 2016-10-31 MED ORDER — TIOTROPIUM BROMIDE MONOHYDRATE 2.5 MCG/ACT IN AERS: 2.0000 | INHALATION_SPRAY | Freq: Every day | RESPIRATORY_TRACT | 0 refills | Status: DC

## 2016-10-31 MED ORDER — AEROCHAMBER MV MISC: 0 refills | Status: DC

## 2016-10-31 MED ORDER — BUDESONIDE-FORMOTEROL FUMARATE 80-4.5 MCG/ACT IN AERO: 2.0000 | INHALATION_SPRAY | Freq: Two times a day (BID) | RESPIRATORY_TRACT | 0 refills | Status: DC

## 2016-10-31 MED ORDER — BUDESONIDE-FORMOTEROL FUMARATE 80-4.5 MCG/ACT IN AERO: 2.0000 | INHALATION_SPRAY | Freq: Two times a day (BID) | RESPIRATORY_TRACT | 3 refills | Status: DC

## 2016-10-31 NOTE — Progress Notes (Signed)
Pre and post spirometry

## 2016-10-31 NOTE — Addendum Note (Signed)
Addended by: Pamalee Leyden on: 10/31/2016 05:05 PM   Modules accepted: Orders

## 2016-10-31 NOTE — Progress Notes (Signed)
Subjective:    Patient ID: Reginald Hahn, male    DOB: 1950/07/13, 66 y.o.   MRN: [id number]  C.C.:  Follow-up for Severe COPD w/ Emphysema, Chronic Allergic Rhinitis, & Tobacco Use Disorder.   HPI Severe COPD w/ Emphysema:  Previously on Trelegy. Since last appointment in our office with me he has been seen by multiple other providers and switched to Symbicort & Spiriva. Patient is a carrier for Alpha-1 Antitrypsin Deficiency. Previously referred to pulmonary rehabilitation. He reports his cough has resolved. He reports he is still using his rescue inhaler 3-4 times daily with dyspnea. He reports no wheezing that he has heard. At present he is only using Symbicort.   Chronic allergic rhinitis:  Recommended trying Flonase at last appointment in addition to Zyrtec. He reports minimal sinus congestion & drainage. He reports he is not using Flonase because of some adverse effect.   Tobacco use disorder: Previously quit in 2017. Has been referred to lung cancer screening program.  Review of Systems He reports no chest pain or pressure. No fever or chills. No rashes or bruising.   Allergies  Allergen Reactions  . Codeine Itching    whelps  . Other     Seasonal allergies, "all spices, salt, pepper"    Current Outpatient Prescriptions on File Prior to Visit  Medication Sig Dispense Refill  . albuterol (PROVENTIL HFA;VENTOLIN HFA) 108 (90 Base) MCG/ACT inhaler Inhale 2 puffs into the lungs every 4 (four) hours as needed for wheezing or shortness of breath. 1 Inhaler 3  . budesonide-formoterol (SYMBICORT) 80-4.5 MCG/ACT inhaler Take 2 puffs first thing in am and then another 2 puffs about 12 hours later. 1 Inhaler 0  . tiotropium (SPIRIVA) 18 MCG inhalation capsule Place 18 mcg into inhaler and inhale daily.     No current facility-administered medications on file prior to visit.     Past Medical History:  Diagnosis Date  . Acute combined systolic and diastolic heart failure (HCC)  4/0/9811  . Allergic rhinitis   . Allergy    seasonal allergies  . Arthritis   . Black widow spider bite   . Stroke (cerebrum) (HCC)   . Tobacco use    x40 years. 1 ppd    Past Surgical History:  Procedure Laterality Date  . APPENDECTOMY     Mid 1960s [childhood]  . RIGHT/LEFT HEART CATH AND CORONARY ANGIOGRAPHY N/A 08/29/2016   Procedure: Right/Left Heart Cath and Coronary Angiography;  Surgeon: Dolores Patty, MD;  Location: [facility] INVASIVE CV LAB;  Service: Cardiovascular;  Laterality: N/A;  . SMALL INTESTINE SURGERY     as a child from swallowing bubble gum  . TONSILLECTOMY    . VASECTOMY      Family History  Problem Relation Age of Onset  . Heart disease Mother        died in her 32's.  . Diabetes Mother   . Prostate cancer Father        died in his 5's.  . Congenital heart disease Brother        died @ age 62.  . Lung disease Neg Hx     Social History   Social History  . Marital status: Divorced    Spouse name: N/A  . Number of children: N/A  . Years of education: N/A   Social History Main Topics  . Smoking status: Former Smoker    Packs/day: 2.00    Years: 50.00    Types: Cigarettes    Start  date: 04/14/1963    Quit date: 10/09/2015  . Smokeless tobacco: Never Used     Comment: stopped for a couple of years in his 30s  . Alcohol use No  . Drug use: No     Comment: Used to smoke marijuana  . Sexual activity: Not Asked   Other Topics Concern  . None   Social History Narrative   Lives alone   2 daughters, ex-wife lives in [place]   Owns a plumbing company   Deafness in the setting of loud machinery      [facility] (06/12/16):   Originally from [facility] [place] [facility]. 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 No pets currently. Remote exposure to a parrot for 2 years.       Objective:   Physical Exam BP (!) 142/90 (BP Location: Right Arm, Patient Position: Sitting, Cuff Size: Normal)   Pulse 84   Ht 6' (1.829  m)   Wt 211 lb (95.7 kg)   SpO2 95%   BMI 28.62 kg/m   General:  Awake. Alert. No distress.  Integument:  Warm & dry. No rash on exposed skin.  Extremities:  No cyanosis or clubbing.  HEENT:  Moist mucus membranes. Mild bilateral nasal turbinate swelling. No oral ulcers. Cardiovascular:  Regular rate. No edema. Unable to appreciate JVD.  Pulmonary:  Symmetrically decreased breath sounds. Normal work of breathing on room air. Abdomen: Soft. Normal bowel sounds. Nondistended.  Musculoskeletal:  Normal bulk and tone. No joint deformity or effusion appreciated.  PFT 10/31/16: FVC 3.34 L (67%) FEV1 1.73 L (46%) FEV1/FVC 0.52 FEF 25-75 0.68 L (23%) positive bronchodilator response 06/15/16: FVC 4.05 L (92%) FEV1 1.83 L (49%) FEV1/FVC 0.45 FEF 25-75 0.60 L (20%) positive bronchodilator response TLC 8.22 L (110%) RV 159% ERV 85% DLCO corrected 42%  10/30/16:  Walked 384 meters / Baseline Sat 99% on RA/ Nadir Sat 97% on RA 06/15/16:  Walked 360 meters / Baseline Sat 94% on RA / Nadir Sat 88% (desat rapidly corrected)  IMAGING MAXILLOFACIAL CT LTD W/O 06/14/16 (per radiologist): No evidence of sinusitis.  CXR PA/LAT 06/12/16 (previously reviewed by me): Hyperinflation suggested by flattening of the diaphragms. No parenchymal mass or opacity appreciated. No pleural effusion. Heart normal in size & mediastinum normal in contour.  CTA CHEST 09/16/15 (previously reviewed by me): Apical predominant emphysematous changes. Linear opacity right lung base suggestive of scar tissue or atelectasis adjacent small to moderate right-sided pleural effusion. No other rectal mass or opacity appreciated. Small left pleural effusion. Borderline enlarged mediastinal and hilar lymph nodes largest 1.1 cm by my measurement. No pulmonary emboli.  CARDIAC TTE (05/17/16): Moderate LVH with normal cavity size. EF 45-50% & grade 1 diastolic dysfunction. In coordinate septal motion. LA & RA normal in size. RV normal in size and  function. No aortic stenosis or regurgitation. Aortic root normal in size. Trivial mitral regurgitation. Aneurysmal interatrial septum but cannot exclude PFO. No significant pulmonic regurgitation. Mild tricuspid regurgitation. No pericardial effusion.  MICROBIOLOGY 09/17/15 Hepatitis A IgM: Negative Hepatitis B surface antigen: Negative Hepatitis B core IgM: Negative Hepatitis C viral antibody: <0.1 HIV: Nonreactive    LABS 08/01/16 Alpha-1 antitrypsin: MZ (99)  06/12/16 IgE: 60 RAST panel: Negative CBC: 10.9/15.8/46.4/233 Eosinophils: 0.4  03/22/16 CBC: 11.7/15.1/45.3/224 BNP: 34.3  09/15/15 UDS:  Positive for benzodiazepines & THC     Assessment & Plan:  66 y.o. male with severe COPD with emphysema, chronic allergic rhinitis, and newly found  alpha-1 antitrypsin carrier status. I did discuss the possibility of developing lung disease from COPD/emphysema and liver disease even without tobacco exposure or alcohol exposure. As such, I referred the patient to the alpha-1 Foundation website and recommended biological relative testing.  Reviewing his spirometry does show a persistent bronchodilator response. Despite this his walk test distance and oxygenation had improved. As such, I am adjusting his regimen to consistently include Spiriva via Respimat device. I instructed the patient to contact me if he had any questions or concerns before his next appointment.   1. Severe COPD w/ Emphysema:Patient in the process of establishing with pulmonary rehabilitation. Continuing Symbicort 80/4.5 with spacer given today. Starting Spiriva Respimat 2.5. Repeat spirometry with bronchodilator challenge at next appointment to ensure maximal bronchodilatation. 2. Alpha-1 antitrypsin carrier:  Patient counseled to have biological relatives tested. 3. Chronic allergic rhinitis: Controlled with Zyrtec. No new medications. 4. Health maintenance: Plan for Pneumovax at next appointment. 5. Follow-up: Return to  clinic in 3 months or sooner if needed.  Donna Christen Jamison Neighbor, M.D. [facility]monary [facility] Critical [facility] Pager:  [phone] After 3pm or if no response, call [phone] 3:53 PM 10/31/16

## 2016-10-31 NOTE — Progress Notes (Signed)
Patient seen in the office today and instructed on use of Spiriva and spacer for Symbicort.  Patient expressed understanding and demonstrated technique.

## 2016-10-31 NOTE — Patient Instructions (Signed)
   We will plan on doing your Pneumonia vaccine at your next appointment or sooner if needed.  You are a carrier for Alpha-1 Antitrypsin Deficiency. Your genes are MZ. We recommend having blood relatives tested because there is treatment. You can also reference the Alpha-1 Foundation's website online.  I'm starting you on Spiriva Respimat and you will continue to use your Symbicort inhaler as prescribed.  Make sure you try using your Symbicort with the spacer we are giving you today.  Call or e-mail me if you have any questions or concerns before your next appointment.  TESTS ORDERED: 1. Spirometry with bronchodilator challenge at next appointment

## 2016-11-06 ENCOUNTER — Telehealth: Payer: Self-pay | Admitting: Pulmonary Disease

## 2016-11-06 MED ORDER — ALBUTEROL SULFATE HFA 108 (90 BASE) MCG/ACT IN AERS: 2.0000 | INHALATION_SPRAY | RESPIRATORY_TRACT | 5 refills | Status: DC | PRN

## 2016-11-06 NOTE — Telephone Encounter (Signed)
Pt states [employer] will cover Ventolin HFA; to be sent to [facility] in [place], [place]... pt contact # [phone].Marland Kitchenert

## 2016-11-06 NOTE — Telephone Encounter (Signed)
Pt states that insurance will not cover proventil but will cover ventolin.  rx has been sent to pharmacy specifying to fill as ventolin.  Nothing further needed at this time.

## 2016-11-15 ENCOUNTER — Encounter (HOSPITAL_COMMUNITY): Payer: Medicare Other

## 2016-11-16 ENCOUNTER — Telehealth: Payer: Self-pay | Admitting: Pulmonary Disease

## 2016-11-16 MED ORDER — PREDNISONE 20 MG PO TABS: 40.0000 mg | ORAL_TABLET | Freq: Every day | ORAL | 0 refills | Status: DC

## 2016-11-16 NOTE — Telephone Encounter (Signed)
Please send in a prescription for Prednisone 40mg  daily x4 days. If he has any fever, chills, sweats, or productive cough he should let us know. Thanks.

## 2016-11-16 NOTE — Telephone Encounter (Signed)
Called and spoke with pt. Pt reports of increased sob and non prod cough x4d. [place] that [place] may had affected his breathing.  Denies fever, chills or sweats. Pt taken ventolin qid & tussin DM at bedtime with mild relief   Pt is requesting a Rx for prednisone to be sent to [facility] on [address].   JN please advise. Thanks.

## 2016-11-16 NOTE — Telephone Encounter (Signed)
Spoke with pt. He is aware of JN's recommendation. Rx has been sent in. Nothing further was needed. 

## 2016-11-16 NOTE — Telephone Encounter (Signed)
Patient called to follow up on his request for prednisone. I explained to the patient a message was sent to provider and the nurse is waiting for a response.

## 2016-11-22 ENCOUNTER — Encounter (HOSPITAL_COMMUNITY): Payer: Medicare Other | Admitting: Internal Medicine

## 2017-01-31 ENCOUNTER — Ambulatory Visit: Payer: Medicare Other | Admitting: Pulmonary Disease

## 2017-02-06 DIAGNOSIS — Z23 Encounter for immunization: Secondary | ICD-10-CM | POA: Diagnosis not present

## 2017-02-21 ENCOUNTER — Other Ambulatory Visit: Payer: Self-pay | Admitting: Pulmonary Disease

## 2017-02-21 DIAGNOSIS — R0600 Dyspnea, unspecified: Secondary | ICD-10-CM

## 2017-02-22 ENCOUNTER — Ambulatory Visit (INDEPENDENT_AMBULATORY_CARE_PROVIDER_SITE_OTHER): Payer: Medicare Other | Admitting: Pulmonary Disease

## 2017-02-22 ENCOUNTER — Encounter: Payer: Self-pay | Admitting: Pulmonary Disease

## 2017-02-22 VITALS — BP 124/82 | HR 84 | Ht 72.0 in | Wt 208.0 lb

## 2017-02-22 DIAGNOSIS — J449 Chronic obstructive pulmonary disease, unspecified: Secondary | ICD-10-CM

## 2017-02-22 DIAGNOSIS — R0600 Dyspnea, unspecified: Secondary | ICD-10-CM

## 2017-02-22 DIAGNOSIS — J309 Allergic rhinitis, unspecified: Secondary | ICD-10-CM | POA: Diagnosis not present

## 2017-02-22 DIAGNOSIS — Z23 Encounter for immunization: Secondary | ICD-10-CM

## 2017-02-22 LAB — PULMONARY FUNCTION TEST
FEF 25-75 Post: 1.11 L/sec
FEF 25-75 Pre: 0.72 L/sec
FEF2575-%CHANGE-POST: 54 %
FEF2575-%PRED-POST: 38 %
FEF2575-%Pred-Pre: 24 %
FEV1-%Change-Post: 22 %
FEV1-%Pred-Post: 61 %
FEV1-%Pred-Pre: 50 %
FEV1-Post: 2.25 L
FEV1-Pre: 1.84 L
FEV1FVC-%CHANGE-POST: 4 %
FEV1FVC-%Pred-Pre: 71 %
FEV6-%Change-Post: 18 %
FEV6-%Pred-Post: 83 %
FEV6-%Pred-Pre: 71 %
FEV6-PRE: 3.32 L
FEV6-Post: 3.92 L
FEV6FVC-%Change-Post: 1 %
FEV6FVC-%PRED-PRE: 100 %
FEV6FVC-%Pred-Post: 102 %
FVC-%CHANGE-POST: 17 %
FVC-%PRED-POST: 82 %
FVC-%PRED-PRE: 70 %
FVC-PRE: 3.46 L
FVC-Post: 4.07 L
POST FEV1/FVC RATIO: 55 %
PRE FEV6/FVC RATIO: 96 %
Post FEV6/FVC ratio: 97 %
Pre FEV1/FVC ratio: 53 %

## 2017-02-22 MED ORDER — BUDESONIDE-FORMOTEROL FUMARATE 160-4.5 MCG/ACT IN AERO: 2.0000 | INHALATION_SPRAY | Freq: Two times a day (BID) | RESPIRATORY_TRACT | 6 refills | Status: DC

## 2017-02-22 MED ORDER — MONTELUKAST SODIUM 10 MG PO TABS: 10.0000 mg | ORAL_TABLET | Freq: Every day | ORAL | 3 refills | Status: DC

## 2017-02-22 NOTE — Progress Notes (Signed)
Spirometry pre and post done today. 

## 2017-02-22 NOTE — Progress Notes (Signed)
Subjective:    Patient ID: Reginald Hahn, male    DOB: 03/20/1951, 66 y.o.   MRN: [id number]  C.C.:  Follow-up for Severe COPD w/ Emphysema, Chronic Allergic Rhinitis, & Tobacco Use Disorder.   HPI Severe COPD: Previously on Trelegy. Switched to Spiriva Respimat which was added to Symbicort 80/4.5 at last appointment. Previously was in the process of establishing with pulmonary rehabilitation program. He does feel his dyspnea has improved since [place] back from [place]. He does feel that he has had more of a cough. He has needed his rescue inhaler rarely over the last 3 days. Prior to that he was using his rescue inhaler 4 times daily. He reports he is trying to exercise some. He decided against doing Pulmonary Rehab.   Chronic allergic rhinitis: Previously had some adverse effects from Flonase nasal spray. Using only Zyrtec at last appointment. He reports he is having some "sneezing attacks" and sinus drainage.   Tobacco use disorder: Previously quit in 2017. Previously referred to lung cancer screening program.  Review of Systems No fever, chills, or sweats. No chest pain or pressure. No abdominal pain or nausea.   Allergies  Allergen Reactions  . Codeine Itching    whelps  . Other     Seasonal allergies, "all spices, salt, pepper"    Current Outpatient Medications on File Prior to Visit  Medication Sig Dispense Refill  . albuterol (PROVENTIL HFA;VENTOLIN HFA) 108 (90 Base) MCG/ACT inhaler Inhale 2 puffs into the lungs every 4 (four) hours as needed for wheezing or shortness of breath. 1 Inhaler 5  . budesonide-formoterol (SYMBICORT) 80-4.5 MCG/ACT inhaler Inhale 2 puffs into the lungs 2 (two) times daily. 1 Inhaler 3  . Spacer/Aero-Holding Chambers (AEROCHAMBER MV) inhaler Use as instructed 1 each 0  . Tiotropium Bromide Monohydrate (SPIRIVA RESPIMAT) 2.5 MCG/ACT AERS Inhale 2 puffs into the lungs daily. 1 Inhaler 3   No current facility-administered medications on file  prior to visit.     Past Medical History:  Diagnosis Date  . Acute combined systolic and diastolic heart failure (HCC) 09/16/2015  . Allergic rhinitis   . Allergy    seasonal allergies  . Arthritis   . Black widow spider bite   . Stroke (cerebrum) (HCC)   . Tobacco use    x40 years. 1 ppd    Past Surgical History:  Procedure Laterality Date  . APPENDECTOMY     Mid 1960s [childhood]  . RIGHT/LEFT HEART CATH AND CORONARY ANGIOGRAPHY N/A 08/29/2016   Procedure: Right/Left Heart Cath and Coronary Angiography;  Surgeon: Dolores PattyBensimhon, Daniel R, MD;  [facility];  Service: Cardiovascular;  Laterality: N/A;  . SMALL INTESTINE SURGERY     as a child from swallowing bubble gum  . TONSILLECTOMY    . VASECTOMY      Family History  Problem Relation Age of Onset  . Heart disease Mother        died in her 4080's.  . Diabetes Mother   . Prostate cancer Father        died in his 5470's.  . Congenital heart disease Brother        died @ age 65.  . Lung disease Neg Hx     Social History   Socioeconomic History  . Marital status: Divorced    Spouse name: None  . Number of children: None  . Years of education: None  . Highest education level: None  Social Needs  . Physicist, medicalinancial resource  strain: None  . Food insecurity - worry: None  . Food insecurity - inability: None  . Transportation needs - medical: None  . Transportation needs - non-medical: None  Occupational History  . None  Tobacco Use  . Smoking status: Former Smoker    Packs/day: 2.00    Years: 50.00    Pack years: 100.00    Types: Cigarettes    Start date: 04/14/1963    Last attempt to quit: 10/09/2015    Years since quitting: 1.3  . Smokeless tobacco: Never Used  . Tobacco comment: stopped for a couple of years in his 30s  Substance and Sexual Activity  . Alcohol use: No    Alcohol/week: 0.0 oz  . Drug use: No    Comment: Used to smoke marijuana  . Sexual activity: None  Other Topics Concern  . 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 No pets currently. Remote exposure to a parrot for 2 years.       Objective:   Physical Exam BP 124/82 (BP Location: Left Arm, Cuff Size: Normal)   Pulse 84   Ht 6' (1.829 m)   Wt 208 lb (94.3 kg)   SpO2 92%   BMI 28.21 kg/m   General:  Awake. No distress. Comfortable. Integument:  No bruising. Warm. Dry. Extremities:  No cyanosis or clubbing.  HEENT:  Minimal nasal turbinate swelling. No scleral icterus. Moist mucous membranes. Cardiovascular:  Regular rate. No edema. Regular rhythm.  Pulmonary:  Overall clear with auscultation. Normal work of breathing on room air. Abdomen: Soft. Normal bowel sounds. Nondistended.  Musculoskeletal:  Normal bulk and tone. No joint deformity or effusion appreciated. Neurological:  Cranial nerves 2-12 grossly in tact. No meningismus. Moving all 4 extremities equally.   PFT 02/22/17: FVC 3.46 L (70%) FEV1 1.84 L (50%) FEV1/FVC 0.53 FEF 25-75 0.72 L (24%) positive bronchodilator response 10/31/16: FVC 3.34 L (67%) FEV1 1.73 L (46%) FEV1/FVC 0.52 FEF 25-75 0.68 L (23%) positive bronchodilator response 06/15/16: FVC 4.05 L (92%) FEV1 1.83 L (49%) FEV1/FVC 0.45 FEF 25-75 0.60 L (20%) positive bronchodilator response TLC 8.22 L (110%) RV 159% ERV 85% DLCO corrected 42%  10/30/16:  Walked 384 meters / Baseline Sat 99% on RA/ Nadir Sat 97% on RA 06/15/16:  Walked 360 meters / Baseline Sat 94% on RA / Nadir Sat 88% (desat rapidly corrected)  IMAGING MAXILLOFACIAL CT LTD W/O 06/14/16 (per radiologist): No evidence of sinusitis.  CXR PA/LAT 06/12/16 (previously reviewed by me): Hyperinflation suggested by flattening of the diaphragms. No parenchymal mass or  opacity appreciated. No pleural effusion. Heart normal in size & mediastinum normal in contour.  CTA CHEST 09/16/15 (previously reviewed by me): Apical predominant emphysematous changes. Linear opacity right lung base suggestive of scar tissue or atelectasis adjacent small to moderate right-sided pleural effusion. No other rectal mass or opacity appreciated. Small left pleural effusion. Borderline enlarged mediastinal and hilar lymph nodes largest 1.1 cm by my measurement. No pulmonary emboli.  CARDIAC TTE (05/17/16): Moderate LVH with normal cavity size. EF 45-50% & grade 1 diastolic dysfunction. In coordinate septal motion. LA & RA normal in size. RV normal in size and function. No aortic stenosis  or regurgitation. Aortic root normal in size. Trivial mitral regurgitation. Aneurysmal interatrial septum but cannot exclude PFO. No significant pulmonic regurgitation. Mild tricuspid regurgitation. No pericardial effusion.  MICROBIOLOGY 09/17/15 Hepatitis A IgM: Negative Hepatitis B surface antigen: Negative Hepatitis B core IgM: Negative Hepatitis C viral antibody: <0.1 HIV: Nonreactive    LABS 08/01/16 Alpha-1 antitrypsin: MZ (99)  06/12/16 IgE: 60 RAST panel: Negative CBC: 10.9/15.8/46.4/233 Eosinophils: 0.4  03/22/16 CBC: 11.7/15.1/45.3/224 BNP: 34.3  09/15/15 UDS:  Positive for benzodiazepines & THC     Assessment & Plan:  66 y.o. male with severe COPD and underlying emphysema. Patient also has chronic allergic rhinitis. I believe he would benefit from increasing his dose of inhaled corticosteroid given his persistent bronchodilator response today. He may also benefit from the addition of Singulair to his regimen with his environmental allergens/sensitivities. He seems to have more of an overlap phenotype between asthma and COPD. He is attempting to increase his exercise regimen in lieu of pulmonary rehabilitation. I instructed the patient to contact our office if he had any new breathing  problems or questions before his next appointment.  1. Severe COPD with emphysema: Continuing Spiriva Respimat. Increasing Symbicort to 160/4.5 & given sample today. 2. Chronic allergic rhinitis: Continuing Zyrtec. Starting Singulair 10 mg by mouth daily at bedtime. 3. Health maintenance: Status post flu vaccine October 2018. Administering Pneumovax 23 today. 4. Follow-up: Return to clinic in 3 months or sooner if needed.  Donna Christen Jamison Neighbor, M.D. [facility] Pulmonary & Critical Care Pager:  [phone] After 3pm or if no response, call [phone] 11:22 AM 02/22/17

## 2017-02-22 NOTE — Addendum Note (Signed)
Addended by: Etheleen Mayhew C on: 02/22/2017 11:55 AM   Modules accepted: Orders

## 2017-02-22 NOTE — Patient Instructions (Addendum)
   Continue using your Spiriva Respimat.  We are increasing your Symbicort to 160/4.5 from your current dose of 80/4.5. Do 2 puffs twice daily with the new inhaler just as well.  I'm also starting you on an allergy medication called Singulair/Montelukast.   Call my office if you have any new breathing problems or questions before your next appointment.  We will see you back in 3 months or sooner if needed.

## 2017-02-26 ENCOUNTER — Telehealth: Payer: Self-pay | Admitting: Pulmonary Disease

## 2017-02-26 NOTE — Telephone Encounter (Signed)
FYI:  I spoke with pt and he has decided not to participate in lung cancer screening at this time. His referral has been cancelled.

## 2017-03-10 DIAGNOSIS — I4891 Unspecified atrial fibrillation: Secondary | ICD-10-CM

## 2017-03-10 HISTORY — DX: Unspecified atrial fibrillation: I48.91

## 2017-03-12 ENCOUNTER — Telehealth: Payer: Self-pay | Admitting: Pulmonary Disease

## 2017-03-12 MED ORDER — PREDNISONE 10 MG PO TABS: ORAL_TABLET | ORAL | 0 refills | Status: DC

## 2017-03-12 NOTE — Telephone Encounter (Signed)
Called spoke with patient who reports he woke up Saturday night @ 10pm with increased SOB, took albuterol and this helped.  Has had to rest more than usual.  Chest was "sore" all day yesterday but feels this is attributed to the increase in his Symbicort from the 11.15.18 ov w/ JN.  Also having increased cough and congestion with clear mucus.  Pt is specifically requesting prednisone.  Denies any wheezing, purulent sputum, f/c/s, PND [facility] on [address]  Allergies  Allergen Reactions  . Codeine Itching    whelps  . Other     Seasonal allergies, "all spices, salt, pepper"     JN not on the schedule today - will forward to DOD Dr Isaiah Serge please advise, thank you

## 2017-03-12 NOTE — Telephone Encounter (Signed)
OK to call in prednisone. 40 mg/day. Taper by 10 gm every 3 days.

## 2017-03-12 NOTE — Telephone Encounter (Signed)
Spoke with pt and advised of Dr Shirlee More recommendations.  Prednisone rx sent to pharmacy.  PT verbalized understanding.  Nothing further needed.

## 2017-03-26 ENCOUNTER — Encounter (HOSPITAL_COMMUNITY): Payer: Self-pay | Admitting: Neurology

## 2017-03-26 ENCOUNTER — Inpatient Hospital Stay (HOSPITAL_COMMUNITY)
Admission: EM | Admit: 2017-03-26 | Discharge: 2017-04-10 | DRG: 308 | Disposition: A | Discharge status: Home Health | Payer: Medicare Other | Attending: Internal Medicine | Admitting: Internal Medicine

## 2017-03-26 ENCOUNTER — Telehealth: Payer: Self-pay | Admitting: Internal Medicine

## 2017-03-26 ENCOUNTER — Other Ambulatory Visit: Payer: Self-pay

## 2017-03-26 ENCOUNTER — Emergency Department (HOSPITAL_COMMUNITY): Payer: Medicare Other

## 2017-03-26 DIAGNOSIS — I447 Left bundle-branch block, unspecified: Secondary | ICD-10-CM | POA: Diagnosis present

## 2017-03-26 DIAGNOSIS — I5043 Acute on chronic combined systolic (congestive) and diastolic (congestive) heart failure: Secondary | ICD-10-CM | POA: Diagnosis not present

## 2017-03-26 DIAGNOSIS — K5900 Constipation, unspecified: Secondary | ICD-10-CM | POA: Diagnosis present

## 2017-03-26 DIAGNOSIS — Z87891 Personal history of nicotine dependence: Secondary | ICD-10-CM

## 2017-03-26 DIAGNOSIS — R570 Cardiogenic shock: Secondary | ICD-10-CM | POA: Diagnosis not present

## 2017-03-26 DIAGNOSIS — I4891 Unspecified atrial fibrillation: Secondary | ICD-10-CM | POA: Diagnosis not present

## 2017-03-26 DIAGNOSIS — E876 Hypokalemia: Secondary | ICD-10-CM

## 2017-03-26 DIAGNOSIS — B3324 Viral cardiomyopathy: Secondary | ICD-10-CM | POA: Diagnosis present

## 2017-03-26 DIAGNOSIS — I5021 Acute systolic (congestive) heart failure: Secondary | ICD-10-CM | POA: Diagnosis not present

## 2017-03-26 DIAGNOSIS — I272 Pulmonary hypertension, unspecified: Secondary | ICD-10-CM | POA: Diagnosis present

## 2017-03-26 DIAGNOSIS — R109 Unspecified abdominal pain: Secondary | ICD-10-CM | POA: Diagnosis present

## 2017-03-26 DIAGNOSIS — Z888 Allergy status to other drugs, medicaments and biological substances status: Secondary | ICD-10-CM

## 2017-03-26 DIAGNOSIS — N170 Acute kidney failure with tubular necrosis: Secondary | ICD-10-CM | POA: Diagnosis present

## 2017-03-26 DIAGNOSIS — R402413 Glasgow coma scale score 13-15, at hospital admission: Secondary | ICD-10-CM | POA: Diagnosis present

## 2017-03-26 DIAGNOSIS — I110 Hypertensive heart disease with heart failure: Secondary | ICD-10-CM | POA: Diagnosis present

## 2017-03-26 DIAGNOSIS — I428 Other cardiomyopathies: Secondary | ICD-10-CM | POA: Diagnosis present

## 2017-03-26 DIAGNOSIS — I5023 Acute on chronic systolic (congestive) heart failure: Secondary | ICD-10-CM | POA: Diagnosis not present

## 2017-03-26 DIAGNOSIS — I480 Paroxysmal atrial fibrillation: Secondary | ICD-10-CM | POA: Diagnosis not present

## 2017-03-26 DIAGNOSIS — I87309 Chronic venous hypertension (idiopathic) without complications of unspecified lower extremity: Secondary | ICD-10-CM | POA: Diagnosis present

## 2017-03-26 DIAGNOSIS — I517 Cardiomegaly: Secondary | ICD-10-CM | POA: Diagnosis not present

## 2017-03-26 DIAGNOSIS — I252 Old myocardial infarction: Secondary | ICD-10-CM

## 2017-03-26 DIAGNOSIS — Z452 Encounter for adjustment and management of vascular access device: Secondary | ICD-10-CM | POA: Diagnosis not present

## 2017-03-26 DIAGNOSIS — Y848 Other medical procedures as the cause of abnormal reaction of the patient, or of later complication, without mention of misadventure at the time of the procedure: Secondary | ICD-10-CM | POA: Diagnosis not present

## 2017-03-26 DIAGNOSIS — I5022 Chronic systolic (congestive) heart failure: Secondary | ICD-10-CM | POA: Diagnosis not present

## 2017-03-26 DIAGNOSIS — Z79899 Other long term (current) drug therapy: Secondary | ICD-10-CM

## 2017-03-26 DIAGNOSIS — J441 Chronic obstructive pulmonary disease with (acute) exacerbation: Secondary | ICD-10-CM | POA: Diagnosis not present

## 2017-03-26 DIAGNOSIS — T82818A Embolism of vascular prosthetic devices, implants and grafts, initial encounter: Secondary | ICD-10-CM | POA: Diagnosis not present

## 2017-03-26 DIAGNOSIS — F419 Anxiety disorder, unspecified: Secondary | ICD-10-CM

## 2017-03-26 DIAGNOSIS — Z7951 Long term (current) use of inhaled steroids: Secondary | ICD-10-CM

## 2017-03-26 DIAGNOSIS — Z7952 Long term (current) use of systemic steroids: Secondary | ICD-10-CM | POA: Diagnosis not present

## 2017-03-26 DIAGNOSIS — I440 Atrioventricular block, first degree: Secondary | ICD-10-CM | POA: Diagnosis present

## 2017-03-26 DIAGNOSIS — J449 Chronic obstructive pulmonary disease, unspecified: Secondary | ICD-10-CM | POA: Diagnosis present

## 2017-03-26 DIAGNOSIS — J309 Allergic rhinitis, unspecified: Secondary | ICD-10-CM | POA: Diagnosis present

## 2017-03-26 DIAGNOSIS — I481 Persistent atrial fibrillation: Secondary | ICD-10-CM | POA: Diagnosis not present

## 2017-03-26 DIAGNOSIS — N179 Acute kidney failure, unspecified: Secondary | ICD-10-CM | POA: Diagnosis not present

## 2017-03-26 DIAGNOSIS — Z9102 Food additives allergy status: Secondary | ICD-10-CM

## 2017-03-26 DIAGNOSIS — J9601 Acute respiratory failure with hypoxia: Secondary | ICD-10-CM | POA: Diagnosis not present

## 2017-03-26 DIAGNOSIS — I361 Nonrheumatic tricuspid (valve) insufficiency: Secondary | ICD-10-CM | POA: Diagnosis not present

## 2017-03-26 DIAGNOSIS — Z91018 Allergy to other foods: Secondary | ICD-10-CM

## 2017-03-26 DIAGNOSIS — R05 Cough: Secondary | ICD-10-CM | POA: Diagnosis not present

## 2017-03-26 DIAGNOSIS — Z881 Allergy status to other antibiotic agents status: Secondary | ICD-10-CM

## 2017-03-26 DIAGNOSIS — R079 Chest pain, unspecified: Secondary | ICD-10-CM | POA: Diagnosis not present

## 2017-03-26 DIAGNOSIS — J9811 Atelectasis: Secondary | ICD-10-CM | POA: Diagnosis not present

## 2017-03-26 DIAGNOSIS — J209 Acute bronchitis, unspecified: Secondary | ICD-10-CM | POA: Diagnosis present

## 2017-03-26 DIAGNOSIS — Z91048 Other nonmedicinal substance allergy status: Secondary | ICD-10-CM

## 2017-03-26 HISTORY — DX: Chronic systolic (congestive) heart failure: I50.22

## 2017-03-26 HISTORY — DX: Unspecified atrial fibrillation: I48.91

## 2017-03-26 HISTORY — DX: Chronic obstructive pulmonary disease, unspecified: J44.9

## 2017-03-26 LAB — CBC
HEMATOCRIT: 40.3 % (ref 39.0–52.0)
Hemoglobin: 13.1 g/dL (ref 13.0–17.0)
MCH: 29.8 pg (ref 26.0–34.0)
MCHC: 32.5 g/dL (ref 30.0–36.0)
MCV: 91.8 fL (ref 78.0–100.0)
Platelets: 215 10*3/uL (ref 150–400)
RBC: 4.39 MIL/uL (ref 4.22–5.81)
RDW: 14.9 % (ref 11.5–15.5)
WBC: 10.5 10*3/uL (ref 4.0–10.5)

## 2017-03-26 LAB — BASIC METABOLIC PANEL
ANION GAP: 10 (ref 5–15)
BUN: 20 mg/dL (ref 6–20)
CHLORIDE: 100 mmol/L — AB (ref 101–111)
CO2: 26 mmol/L (ref 22–32)
Calcium: 8.7 mg/dL — ABNORMAL LOW (ref 8.9–10.3)
Creatinine, Ser: 1.15 mg/dL (ref 0.61–1.24)
GFR calc non Af Amer: >60 mL/min (ref >60)
Glucose, Bld: 208 mg/dL — ABNORMAL HIGH (ref 65–99)
Potassium: 3.6 mmol/L (ref 3.5–5.1)
SODIUM: 136 mmol/L (ref 135–145)

## 2017-03-26 LAB — I-STAT TROPONIN, ED: Troponin i, poc: 0.05 ng/mL (ref 0.00–0.08)

## 2017-03-26 MED ORDER — SODIUM CHLORIDE 0.9 % IV BOLUS (SEPSIS)
1000.0000 mL | Freq: Once | INTRAVENOUS | Status: AC
  Administered 2017-03-26: 1000 mL via INTRAVENOUS

## 2017-03-26 MED ORDER — DILTIAZEM HCL-DEXTROSE 100-5 MG/100ML-% IV SOLN (PREMIX)
5.0000 mg/h | INTRAVENOUS | Status: DC
Start: 2017-03-26 — End: 2017-03-27
  Administered 2017-03-26: 5 mg/h via INTRAVENOUS
  Administered 2017-03-27: 15 mg/h via INTRAVENOUS
  Filled 2017-03-26 (×2): qty 100

## 2017-03-26 MED ORDER — DILTIAZEM LOAD VIA INFUSION
10.0000 mg | Freq: Once | INTRAVENOUS | Status: AC
  Administered 2017-03-26: 10 mg via INTRAVENOUS
  Filled 2017-03-26: qty 10

## 2017-03-26 MED ORDER — HEPARIN SODIUM (PORCINE) 5000 UNIT/ML IJ SOLN: 5000.0000 [IU] | Freq: Three times a day (TID) | INTRAMUSCULAR | Status: DC

## 2017-03-26 NOTE — H&P (Signed)
Cardiology Admission History and Physical:   Patient ID: Reginald Hahn; MRN: [id number]; DOB: 08-09-1950   Admission date: 03/26/2017  Primary Care Provider: Ginger Carne, MD Primary Cardiologist: Dr Gala Romney Primary Electrophysiologist:    Chief Complaint:   Dyspnea  Patient Profile:   Reginald Hahn is a 66 y.o. male with h/o COPD admitted in 6/17 with acute systolic HF, AF with RVR and cardiogenic shock. LBBB  History of Present Illness:   Reginald Hahn "Reginald Hahn" is a 66 y.o. male with h/o COPD admitted in 6/17 with acute systolic HF, AF with RVR and cardiogenic shock. LBBB  Pt was given Pneumovax on 02/22/17- since then has been experiencing low grade temp(doesn't have thermometer), b/l ankle swelling, abdominal distention, stomach cramping, constipation, loss of appetite, fatigue, dark straw colored urine.   BP normal.  I asked pt if he has contacted PCP as he is denying any breathing complaints- pt has not, said that he called our office d/t "all symptoms coming directly after receiving pneumovax, our office should handle this".  Pt refused to call PCP or be seen by pcp/UC.   Pt has been trying to keep fluid intake up, resting, using enemas to try and help with constipation.  Requesting additional recs.      Patient was admitted 6/17 with acute systolic HF and new onset afib with RVR and EF found to be 15%.Felt to to have viral CM exacerbated by afib. Diuresed slowly so PICC placed and co-ox low. Started on IV amio and milrinone and subsequently diuresed well. Was pending cath and TEE/DC-CV but developed massive RP bleed and was transferred to ICU.   All anti-coagulants held. Received multiple transfusions. Subsequently recovered. During that time also had loss of peripheral vision in R eye. Head CT negative. Pre discharge had Myoview with EF 30-44%.  Inferior infarct vs gut attenuation. No ischemia.    Pulmonary Clinic with Dr. Jamison Neighbor and told he has severe COPD  FEV1 1.83 (49%) FEF 25-75% 0.60L  DLCO 42%.    Myoview 6/17  There was no ST segment deviation noted during stress.  The left ventricular ejection fraction is moderately decreased (30-44%).  Findings consistent with prior myocardial infarction.  This is an intermediate risk study.  nferior/inferolateral defect (moderate) that does not improve in the rest images consistent with scar and possible soft tissue attenuation. No significant ischemia.   Past Medical History:  Diagnosis Date  . Acute combined systolic and diastolic heart failure (HCC) 09/16/2015  . Allergic rhinitis   . Allergy    seasonal allergies  . Arthritis   . Black widow spider bite   . Stroke (cerebrum) (HCC)   . Tobacco use    x40 years. 1 ppd    Past Surgical History:  Procedure Laterality Date  . APPENDECTOMY     Mid 1960s [childhood]  . RIGHT/LEFT HEART CATH AND CORONARY ANGIOGRAPHY N/A 08/29/2016   Procedure: Right/Left Heart Cath and Coronary Angiography;  Surgeon: Dolores Patty, MD;  Location: [facility] INVASIVE CV LAB;  Service: Cardiovascular;  Laterality: N/A;  . SMALL INTESTINE SURGERY     as a child from swallowing bubble gum  . TONSILLECTOMY    . VASECTOMY       Medications Prior to Admission: Prior to Admission medications   Medication Sig Start Date End Date Taking? Authorizing Provider  acetaminophen (TYLENOL) 325 MG tablet Take 650 mg by mouth every 6 (six) hours as needed for mild pain or fever.   Yes [provider]  albuterol (  PROVENTIL HFA;VENTOLIN HFA) 108 (90 Base) MCG/ACT inhaler Inhale 2 puffs into the lungs every 4 (four) hours as needed for wheezing or shortness of breath. 11/06/16  Yes Roslynn AmbleNestor, Jennings E, MD  budesonide-formoterol [facility](SYMBICORT) 160-4.5 MCG/ACT inhaler Inhale 2 puffs 2 (two) times daily into the lungs. 02/22/17  Yes Roslynn AmbleNestor, Jennings E, MD  cetirizine (ZYRTEC) 10 MG tablet Take 10 mg by mouth daily.   Yes [provider]  Multiple Vitamin  (MULTIVITAMIN WITH MINERALS) TABS tablet Take 1 tablet by mouth daily.   Yes [provider]  nicotine (NICODERM CQ - DOSED IN MG/24 HOURS) 14 mg/24hr patch Place 14 mg onto the skin as needed (Urge to smoke).   Yes [provider]  omeprazole (PRILOSEC) 20 MG capsule Take 20 mg by mouth every morning.   Yes [provider]  OXYGEN Inhale 1 L into the lungs as needed (Shortness of breath).   Yes [provider]  predniSONE (DELTASONE) 10 MG tablet Take 4 tablets daily x 3 days, 3 tablets daily x 3 days, 2 tablets daily x 3 days, 1 tablet daily x 3 days then stop. 03/12/17  Yes Mannam, Praveen, MD  sodium chloride (OCEAN) 0.65 % SOLN nasal spray Place 1 spray into both nostrils as needed for congestion.   Yes [provider]  Tiotropium Bromide Monohydrate (SPIRIVA RESPIMAT) 2.5 MCG/ACT AERS Inhale 2 puffs into the lungs daily. 10/31/16  Yes Roslynn AmbleNestor, Jennings E, MD  montelukast (SINGULAIR) 10 MG tablet Take 1 tablet (10 mg total) at bedtime by mouth. Patient not taking: Reported on 03/26/2017 02/22/17   Roslynn AmbleNestor, Jennings E, MD  Spacer/Aero-Holding Chambers (AEROCHAMBER MV) inhaler Use as instructed 10/31/16   Roslynn AmbleNestor, Jennings E, MD     Allergies:    Allergies  Allergen Reactions  . Codeine Itching    whelps  . Other     Seasonal allergies, "all spices, salt, pepper"    Social History:   Social History   Socioeconomic History  . Marital status: Divorced    Spouse name: Not on file  . Number of children: Not on file  . Years of education: Not on file  . Highest education level: Not on file  Social Needs  . Financial resource strain: Not on file  . Food insecurity - worry: Not on file  . Food insecurity - inability: Not on file  . Transportation needs - medical: Not on file  . Transportation needs - non-medical: Not on file  Occupational History  . Not on file  Tobacco Use  . Smoking status: Former Smoker    Packs/day: 2.00    Years: 50.00      Pack years: 100.00    Types: Cigarettes    Start date: 04/14/1963    Last attempt to quit: 10/09/2015    Years since quitting: 1.4  . Smokeless tobacco: Never Used  . Tobacco comment: stopped for a couple of years in his 30s  Substance and Sexual Activity  . Alcohol use: No    Alcohol/week: 0.0 oz  . Drug use: No    Comment: Used to smoke marijuana  . Sexual activity: Not on file  Other Topics Concern  . 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 Does have asbestos and mold exposure. No pets currently. Remote exposure to a parrot for 2 years.     Family History:   The patient's family history includes Congenital heart disease in his brother; Diabetes in his mother; Heart disease in his mother; Prostate cancer in his father. There is no history of Lung disease.    ROS:  Please see the history of present illness.  All other ROS reviewed and negative.    Positive  For  Weakness, palpitations Physical Exam/Data:   Vitals:   03/26/17 2030 03/26/17 2045 03/26/17 2115 03/26/17 2145  BP: 114/89 (!) 122/94  97/76  Pulse: (!) 108  (!) 138 (!) 139  Resp: (!) 29 20  (!) 29  Temp:      TempSrc:      SpO2: 96%   95%  Height:       No intake or output data in the 24 hours ending 03/26/17 2210 There were no vitals filed for this visit. Body mass index is 28.21 kg/m.  General:  Well nourished, well developed, in no acute distress HEENT: normal Lymph: no adenopathy Neck: no increase in  JVD Endocrine:  No thryomegaly Vascular: No carotid bruits; FA pulses 2+ bilaterally without bruits  Cardiac:  Irregular rhythm,  ; FLAT JVD ;  TR murmur Lungs:  clear to auscultation bilaterally, no wheezing, rhonchi or rales  Abd: soft, nontender, no hepatomegaly  Ext:  no significant edema Musculoskeletal:  No deformities, BUE and BLE strength normal and equal Skin: warm and dry  Neuro:  CNs 2-12 intact, no focal abnormalities noted Psych:  Normal affect    EKG:  The ECG that was done AFIB  LBBB Relevant CV Studies:  Myoview 6/17  There was no ST segment deviation noted during stress.  The left ventricular ejection fraction is moderately decreased (30-44%).  Findings consistent with prior myocardial infarction.  This is an intermediate risk study.  nferior/inferolateral defect (moderate) that does not improve in the rest images consistent with scar and possible soft tissue attenuation. No significant ischemia.   RHC/LHC 08/29/2016 RA = 6 RV = 40/8 PA = 40/13 (24) PCW = 19 Ao = 104/82 (93) LV = 141/17 Fick cardiac output/index = 4.8/2.3 PVR = 1.0 WU SVR 1437  Ao sat = 90% PA sat = 62%, 63%   Assessment: 1. Minimal non-obstructive CAD 2. Severe nonischemic CM with EF 25% by cath (EF likely underestimated on re-look likely 40%) 3. Relatively well compensated filling pressures with mild pulmonary venous HTN   Echo 8/17 EF 45% Echo 2/18 EF 45-50%    Laboratory Data:  Chemistry Recent Labs  Lab 03/26/17 1521  NA 136  K 3.6  CL 100*  CO2 26  GLUCOSE 208*  BUN 20  CREATININE 1.15  CALCIUM 8.7*  GFRNONAA >60  GFRAA >60  ANIONGAP 10    No results for input(s): PROT, ALBUMIN, AST, ALT, ALKPHOS, BILITOT in the last 168 hours. Hematology Recent Labs  Lab 03/26/17 1521  WBC 10.5  RBC 4.39  HGB 13.1  HCT 40.3  MCV 91.8  MCH 29.8  MCHC 32.5  RDW 14.9  PLT 215   Cardiac EnzymesNo results for input(s): TROPONINI in the last 168 hours.  Recent Labs  Lab 03/26/17 1602  TROPIPOC 0.05    BNPNo results for input(s): BNP, PROBNP in the last 168 hours.  DDimer No results for input(s): DDIMER in the last 168 hours.  Radiology/Studies:  Dg Chest 2 View  Result Date:  03/26/2017 CLINICAL DATA:  66 year old male  with flu-like symptoms. Chills, body aches, weakness and mild cough. History of COPD. Chest pain. EXAM: CHEST  2 VIEW COMPARISON:  Chest x-ray 08/25/2016. FINDINGS: Diffuse peribronchial cuffing. No acute consolidative airspace disease. No pleural effusions. No definite suspicious appearing pulmonary nodules or masses. No evidence of pulmonary edema. Heart size is borderline enlarged. The patient is rotated to the right on today's exam, resulting in distortion of the mediastinal contours and reduced diagnostic sensitivity and specificity for mediastinal pathology. Atherosclerotic calcifications in the thoracic aorta. IMPRESSION: 1. Diffuse peribronchial cuffing, concerning for an acute bronchitis. 2. Aortic atherosclerosis. Electronically Signed   By: Trudie Reed M.D.   On: 03/26/2017 16:41    Assessment and Plan:        ASSESSMENT & PLAN:  1.  PAF with RVR  ON CARDIZEM GTT, controlled  - Off amiodarone. Regular pulse.   - Has refused AC due to h/o RP bleed. HIVES, No S/Q heparin as well,   2.Chronic Systolic HF --EF 16% by echo 09/2015. ~35-40% by Myoview.   1. Minimal non-obstructive CAD 2. Severe nonischemic CM with EF 25% by cath (EF likely underestimated on re-look likely 40%) 3. Relatively well compensated filling pressures with mild pulmonary venous HTN   Echo 8/17 EF 45% Echo 2/18 EF 45-50%   Likely due to tachy CM in setting of rapid AF but Myoview also suggestive of CAD.  --Echo 2/18 EF 45-50% - NYHA IIIB. RHC/LHC results reviewed and discussed Volume status stable. Does not need lasix.  Continue current dose of losartan and spiro.   - Continue spiro 25 mg daily.  Portable  ECHO~40-45% per Dr Gala Romney    If he goes back in A Fib will need tikosyn , per  Dr Teressa Lower Notes, JUNE  2018.  Suggest EP  Opinion  And  Will request AM team to discuss  EP .    - This patients CHA2DS2-VASc Score and unadjusted Ischemic Stroke Rate (% per year) is equal to 5  (HTN, CHF, age, CVA)  3. h/o RP bleed Therefore and  Also   Based on patients  Wishes , no  Heparin IV or S/Q  Initiated.  4. Tobacco use - Not smoking.  5. Transient loss of R peripheral vision - Concerning for embolic CVA in setting of AF - Pt declines Neuro referal, MRI, and/or carotid dopplers.  6. HTN - Mildly elevated on meds as above.  7. LBBB       Severity of Illness: The appropriate patient status for this patient is OBSERVATION. Observation status is judged to be reasonable and necessary in order to provide the required intensity of service to ensure the patient's safety. The patient's presenting symptoms, physical exam findings, and initial radiographic and laboratory data in the context of their medical condition is felt to place them at decreased risk for further clinical deterioration. Furthermore, it is anticipated that the patient will be medically stable for discharge from the hospital within 2 midnights of admission. The following factors support the patient status of observation.   " The patient's presenting symptoms include dyspena, weakness. " The physical exam findings include AFIB, irregular rhythm  " The initial radiographic and laboratory data are EKG- AFIB RVR     For questions or updates, please contact [facility] Please consult [email or web address] for contact info under Cardiology/STEMI.    Signed, Lynwood Dawley, MD  03/26/2017 10:10 PM

## 2017-03-26 NOTE — Telephone Encounter (Signed)
These symptoms are not at all typical of a reaction to any kind of a shot,much less the pneumovax, and since they are not respiratory symptoms they need to be evaluated by PCP regardless of the timing of the symptoms relative to receiving the shot (more likely he caught a bug coming or going to the office)   - he will need to establish with new pulmonary doc since he's Nestor's pt - first available for new pt 30 min slot

## 2017-03-26 NOTE — ED Notes (Signed)
Pt stated, "I really need some oxygen. I were it at home, but not always." Pt's O2 98% on room air. Placed pt on NC2L. Arlys John, RN was notified.

## 2017-03-26 NOTE — ED Notes (Signed)
Heparin allergy reported by family and patient.  MD paged

## 2017-03-26 NOTE — ED Triage Notes (Addendum)
Pt reports received flu vaccine, PNA vaccine recently. Has been having abdominal cramping, constipation, trouble urinating. Urine has been darker than normal. Also reports low grade fever at home. Pt is a x 4. In NAD. HR is 145 AFIB, denies hx. Does report new cough. Did give himself an enema x 2, and resulted in BM.

## 2017-03-26 NOTE — ED Provider Notes (Addendum)
[facility] EMERGENCY DEPARTMENT Provider Note   CSN: [id number] Arrival date & time: 03/26/17  1440     History   Chief Complaint Chief Complaint  Patient presents with  . Generalized Body Aches  . Abdominal Cramping  . Constipation    HPI Reginald Hahn is a 66 y.o. male.  The history is provided by the patient and medical records.  Illness  This is a new problem. Episode onset: 2wks ago afer receiving PNA vaccine. The problem occurs constantly. The problem has not changed since onset.Associated symptoms include abdominal pain ("cramping") and shortness of breath (chronic COPD). Pertinent negatives include no chest pain and no headaches. Nothing aggravates the symptoms. Relieved by: enemas. The treatment provided mild relief.    Past Medical History:  Diagnosis Date  . Acute combined systolic and diastolic heart failure (HCC) 09/16/2015  . Allergic rhinitis   . Allergy    seasonal allergies  . Arthritis   . Black widow spider bite   . Stroke (cerebrum) (HCC)   . Tobacco use    x40 years. 1 ppd    Patient Active Problem List   Diagnosis Date Noted  . Alpha-1-antitrypsin deficiency carrier (HCC) 10/31/2016  . Upper airway cough syndrome 09/01/2016  . COPD, severe (HCC) 06/16/2016  . Cough 06/12/2016  . Allergic rhinitis 06/12/2016  . Left bundle branch block 05/16/2016  . HTN (hypertension) 05/16/2016  . Chronic systolic CHF (congestive heart failure) (HCC) 03/22/2016  . Testicular pain, right 10/11/2015  . Rash and nonspecific skin eruption 10/11/2015  . Limited peripheral vision of right eye 10/11/2015  . Atrial fibrillation (HCC)   . Tobacco abuse 09/15/2015  . Transaminitis 09/15/2015    Past Surgical History:  Procedure Laterality Date  . APPENDECTOMY     Mid 1960s [childhood]  . RIGHT/LEFT HEART CATH AND CORONARY ANGIOGRAPHY N/A 08/29/2016   Procedure: Right/Left Heart Cath and Coronary Angiography;  Surgeon: Dolores Patty,  MD;  Location: [facility] INVASIVE CV LAB;  Service: Cardiovascular;  Laterality: N/A;  . SMALL INTESTINE SURGERY     as a child from swallowing bubble gum  . TONSILLECTOMY    . VASECTOMY         Home Medications    Prior to Admission medications   Medication Sig Start Date End Date Taking? Authorizing Provider  acetaminophen (TYLENOL) 325 MG tablet Take 650 mg by mouth every 6 (six) hours as needed for mild pain or fever.   Yes [provider]  albuterol (PROVENTIL HFA;VENTOLIN HFA) 108 (90 Base) MCG/ACT inhaler Inhale 2 puffs into the lungs every 4 (four) hours as needed for wheezing or shortness of breath. 11/06/16  Yes Roslynn Amble, MD  budesonide-formoterol [facility]) 160-4.5 MCG/ACT inhaler Inhale 2 puffs 2 (two) times daily into the lungs. 02/22/17  Yes Roslynn Amble, MD  cetirizine (ZYRTEC) 10 MG tablet Take 10 mg by mouth daily.   Yes [provider]  Multiple Vitamin (MULTIVITAMIN WITH MINERALS) TABS tablet Take 1 tablet by mouth daily.   Yes [provider]  nicotine (NICODERM CQ - DOSED IN MG/24 HOURS) 14 mg/24hr patch Place 14 mg onto the skin as needed (Urge to smoke).   Yes [provider]  omeprazole (PRILOSEC) 20 MG capsule Take 20 mg by mouth every morning.   Yes [provider]  OXYGEN Inhale 1 L into the lungs as needed (Shortness of breath).   Yes [provider]  predniSONE (DELTASONE) 10 MG tablet Take 4 tablets daily  x 3 days, 3 tablets daily x 3 days, 2 tablets daily x 3 days, 1 tablet daily x 3 days then stop. 03/12/17  Yes Mannam, Praveen, MD  sodium chloride (OCEAN) 0.65 % SOLN nasal spray Place 1 spray into both nostrils as needed for congestion.   Yes [provider]  Tiotropium Bromide Monohydrate (SPIRIVA RESPIMAT) 2.5 MCG/ACT AERS Inhale 2 puffs into the lungs daily. 10/31/16  Yes Roslynn AmbleNestor, Jennings E, MD  montelukast (SINGULAIR) 10 MG tablet Take 1 tablet (10 mg total) at bedtime by  mouth. Patient not taking: Reported on 03/26/2017 02/22/17   Roslynn AmbleNestor, Jennings E, MD  Spacer/Aero-Holding Chambers (AEROCHAMBER MV) inhaler Use as instructed 10/31/16   Roslynn AmbleNestor, Jennings E, MD    Family History Family History  Problem Relation Age of Onset  . Heart disease Mother        died in her 4480's.  . Diabetes Mother   . Prostate cancer Father        died in his 5470's.  . Congenital heart disease Brother        died @ age 65.  . Lung disease Neg Hx     Social History Social History   Tobacco Use  . Smoking status: Former Smoker    Packs/day: 2.00    Years: 50.00    Pack years: 100.00    Types: Cigarettes    Start date: 04/14/1963    Last attempt to quit: 10/09/2015    Years since quitting: 1.4  . Smokeless tobacco: Never Used  . Tobacco comment: stopped for a couple of years in his 30s  Substance Use Topics  . Alcohol use: No    Alcohol/week: 0.0 oz  . Drug use: No    Comment: Used to smoke marijuana     Allergies   Codeine and Other   Review of Systems Review of Systems  Constitutional: Positive for chills and fever.  HENT: Negative for rhinorrhea and sore throat.   Eyes: Negative for pain and visual disturbance.  Respiratory: Positive for shortness of breath (chronic COPD). Negative for cough and wheezing.   Cardiovascular: Positive for leg swelling. Negative for chest pain and palpitations.  Gastrointestinal: Positive for abdominal pain ("cramping") and constipation. Negative for vomiting.  Genitourinary: Negative for dysuria.  Musculoskeletal: Negative for arthralgias and back pain.  Skin: Negative for color change and rash.  Neurological: Negative for seizures, syncope and headaches.  All other systems reviewed and are negative.    Physical Exam Updated Vital Signs BP (!) 129/96 (BP Location: Right Arm)   Pulse (!) 145   Temp 97.8 F (36.6 C) (Oral)   Resp 18   Ht 6' (1.829 m)   SpO2 95%   BMI 28.21 kg/m   Physical Exam  Constitutional: He  appears well-developed.  HENT:  Head: Normocephalic.  Cardiovascular: An irregularly irregular rhythm present. Tachycardia present.  Musculoskeletal: He exhibits edema.  Neurological: He is alert.  Skin: Skin is warm.  Nursing note and vitals reviewed.    ED Treatments / Results  Labs (all labs ordered are listed, but only abnormal results are displayed) Labs Reviewed  BASIC METABOLIC PANEL - Abnormal; Notable for the following components:      Result Value   Chloride 100 (*)    Glucose, Bld 208 (*)    Calcium 8.7 (*)    All other components within normal limits  CBC  I-STAT TROPONIN, ED    EKG  EKG Interpretation None  Radiology Dg Chest 2 View  Result Date: 03/26/2017 CLINICAL DATA:  66 year old male with flu-like symptoms. Chills, body aches, weakness and mild cough. History of COPD. Chest pain. EXAM: CHEST  2 VIEW COMPARISON:  Chest x-Berea Majkowski 08/25/2016. FINDINGS: Diffuse peribronchial cuffing. No acute consolidative airspace disease. No pleural effusions. No definite suspicious appearing pulmonary nodules or masses. No evidence of pulmonary edema. Heart size is borderline enlarged. The patient is rotated to the right on today's exam, resulting in distortion of the mediastinal contours and reduced diagnostic sensitivity and specificity for mediastinal pathology. Atherosclerotic calcifications in the thoracic aorta. IMPRESSION: 1. Diffuse peribronchial cuffing, concerning for an acute bronchitis. 2. Aortic atherosclerosis. Electronically Signed   By: Trudie Reed M.D.   On: 03/26/2017 16:41    Procedures Procedures (including critical care time)  Medications Ordered in ED Medications - No data to display   Initial Impression / Assessment and Plan / ED Course  I have reviewed the triage vital signs and the nursing notes.  Pertinent labs & imaging results that were available during my care of the patient were reviewed by me and considered in my medical decision  making (see chart for details).     Pt with h/o COPD (2L [place] at night), CHF, AFib presents with multiple complaints. Says since receiving his pneumonia vaccine 2wks ago he's had subjective fevers, abdominal distention & cramping, b/l leg swelling, constipation and dark colored urine. Pt says before getting the vaccine, he was in his normal state of health. Has used several enemas at home which have relieved his constipation and most of his abdominal cramping. Says he was told to stop taking all of his cardiac medications months ago because his "heart healed" but I cannot find any notes in the chart that speak to the discontinuation of his meds. Endorses increased b/l LE edema; denies HA, lightheadedness, CP, worsening SOB, N/V.  VS & exam as above. EKG: Afib w/RVR @ 142bpm. CXR diffuse cuffing concerning for bronchitis. Labs unremarkable.  Diltiazem bolus & drip started in the ED for RVR; subsequent decrease in HR to the 100s-110s.  Will admit the Pt to Cardiology for further evaluation and treatment.  Final Clinical Impressions(s) / ED Diagnoses   Final diagnoses:  Atrial fibrillation, unspecified type [facility])    ED Discharge Orders    None       Forest Becker, MD 03/26/17 2027    Margarita Grizzle, MD 03/26/17 1115    Margarita Grizzle, MD 04/24/17 1038

## 2017-03-26 NOTE — Telephone Encounter (Signed)
Spoke with pt, aware of recs.  Pt states that d/t worsening belly pain he will be going to ED for evaluation.  Will close encounter.

## 2017-03-26 NOTE — Telephone Encounter (Signed)
Pt was given Pneumovax on 02/22/17- since then has been experiencing low grade temp(doesn't have thermometer), b/l ankle swelling, abdominal distention, stomach cramping, constipation, loss of appetite, fatigue, dark straw colored urine.   BP normal.  I asked pt if he has contacted PCP as he is denying any breathing complaints- pt has not, said that he called our office d/t "all symptoms coming directly after receiving pneumovax, our office should handle this".  Pt refused to call PCP or be seen by pcp/UC.   Pt has been trying to keep fluid intake up, resting, using enemas to try and help with constipation.  Requesting additional recs.    Pt uses [facility] on [address].   MW please advise.  Thanks.

## 2017-03-27 ENCOUNTER — Other Ambulatory Visit: Payer: Self-pay

## 2017-03-27 ENCOUNTER — Encounter (HOSPITAL_COMMUNITY): Payer: Self-pay | Admitting: General Practice

## 2017-03-27 DIAGNOSIS — I480 Paroxysmal atrial fibrillation: Principal | ICD-10-CM

## 2017-03-27 DIAGNOSIS — I5021 Acute systolic (congestive) heart failure: Secondary | ICD-10-CM

## 2017-03-27 LAB — BASIC METABOLIC PANEL
Anion gap: 12 (ref 5–15)
BUN: 26 mg/dL — ABNORMAL HIGH (ref 6–20)
CHLORIDE: 102 mmol/L (ref 101–111)
CO2: 22 mmol/L (ref 22–32)
CREATININE: 1.34 mg/dL — AB (ref 0.61–1.24)
Calcium: 8.4 mg/dL — ABNORMAL LOW (ref 8.9–10.3)
GFR calc non Af Amer: 54 mL/min — ABNORMAL LOW (ref >60)
Glucose, Bld: 204 mg/dL — ABNORMAL HIGH (ref 65–99)
POTASSIUM: 3.5 mmol/L (ref 3.5–5.1)
Sodium: 136 mmol/L (ref 135–145)

## 2017-03-27 LAB — CBC
HCT: 39 % (ref 39.0–52.0)
Hemoglobin: 12.8 g/dL — ABNORMAL LOW (ref 13.0–17.0)
MCH: 30.1 pg (ref 26.0–34.0)
MCHC: 32.8 g/dL (ref 30.0–36.0)
MCV: 91.8 fL (ref 78.0–100.0)
Platelets: 196 10*3/uL (ref 150–400)
RBC: 4.25 MIL/uL (ref 4.22–5.81)
RDW: 15 % (ref 11.5–15.5)
WBC: 12.1 10*3/uL — ABNORMAL HIGH (ref 4.0–10.5)

## 2017-03-27 LAB — HIV ANTIBODY (ROUTINE TESTING W REFLEX): HIV Screen 4th Generation wRfx: NONREACTIVE

## 2017-03-27 LAB — CREATININE, SERUM
CREATININE: 1.05 mg/dL (ref 0.61–1.24)
GFR calc Af Amer: >60 mL/min (ref >60)

## 2017-03-27 MED ORDER — AMIODARONE LOAD VIA INFUSION
150.0000 mg | Freq: Once | INTRAVENOUS | Status: AC
  Administered 2017-03-27: 150 mg via INTRAVENOUS
  Filled 2017-03-27: qty 83.34

## 2017-03-27 MED ORDER — DILTIAZEM HCL-DEXTROSE 100-5 MG/100ML-% IV SOLN (PREMIX)
5.0000 mg/h | INTRAVENOUS | Status: DC
  Administered 2017-03-27: 15 mg/h via INTRAVENOUS
  Filled 2017-03-27: qty 100

## 2017-03-27 MED ORDER — MOMETASONE FURO-FORMOTEROL FUM 200-5 MCG/ACT IN AERO
2.0000 | INHALATION_SPRAY | Freq: Two times a day (BID) | RESPIRATORY_TRACT | Status: DC
  Administered 2017-03-27 – 2017-04-07 (×12): 2 via RESPIRATORY_TRACT
  Filled 2017-03-27 (×3): qty 8.8

## 2017-03-27 MED ORDER — ACETAMINOPHEN 325 MG PO TABS: 650.0000 mg | ORAL_TABLET | Freq: Four times a day (QID) | ORAL | Status: DC | PRN

## 2017-03-27 MED ORDER — LORATADINE 10 MG PO TABS
10.0000 mg | ORAL_TABLET | Freq: Every day | ORAL | Status: DC
  Administered 2017-03-27 – 2017-04-10 (×15): 10 mg via ORAL
  Filled 2017-03-27 (×15): qty 1

## 2017-03-27 MED ORDER — ACETAMINOPHEN 325 MG PO TABS: 650.0000 mg | ORAL_TABLET | ORAL | Status: DC | PRN

## 2017-03-27 MED ORDER — ALBUTEROL SULFATE HFA 108 (90 BASE) MCG/ACT IN AERS
2.0000 | INHALATION_SPRAY | RESPIRATORY_TRACT | Status: DC | PRN
  Administered 2017-03-27: 2 via RESPIRATORY_TRACT
  Filled 2017-03-27: qty 6.7

## 2017-03-27 MED ORDER — ALBUTEROL SULFATE (2.5 MG/3ML) 0.083% IN NEBU: 2.5000 mg | INHALATION_SOLUTION | RESPIRATORY_TRACT | Status: DC | PRN

## 2017-03-27 MED ORDER — TRAZODONE HCL 50 MG PO TABS
50.0000 mg | ORAL_TABLET | Freq: Once | ORAL | Status: AC
  Administered 2017-03-27: 50 mg via ORAL
  Filled 2017-03-27: qty 1

## 2017-03-27 MED ORDER — TIOTROPIUM BROMIDE MONOHYDRATE 18 MCG IN CAPS
18.0000 ug | ORAL_CAPSULE | Freq: Every day | RESPIRATORY_TRACT | Status: DC
  Administered 2017-03-27 – 2017-04-06 (×7): 18 ug via RESPIRATORY_TRACT
  Filled 2017-03-27 (×3): qty 5

## 2017-03-27 MED ORDER — AMIODARONE HCL IN DEXTROSE 360-4.14 MG/200ML-% IV SOLN
30.0000 mg/h | INTRAVENOUS | Status: DC
  Administered 2017-03-27 – 2017-03-30 (×6): 30 mg/h via INTRAVENOUS
  Filled 2017-03-27 (×8): qty 200

## 2017-03-27 MED ORDER — MONTELUKAST SODIUM 10 MG PO TABS: 10.0000 mg | ORAL_TABLET | Freq: Every day | ORAL | Status: DC

## 2017-03-27 MED ORDER — ONDANSETRON HCL 4 MG/2ML IJ SOLN
4.0000 mg | Freq: Four times a day (QID) | INTRAMUSCULAR | Status: DC | PRN
  Administered 2017-03-28 – 2017-03-30 (×3): 4 mg via INTRAVENOUS
  Filled 2017-03-27 (×3): qty 2

## 2017-03-27 MED ORDER — PREDNISONE 10 MG PO TABS
10.0000 mg | ORAL_TABLET | Freq: Every day | ORAL | Status: DC
  Administered 2017-03-27 – 2017-03-28 (×3): 10 mg via ORAL
  Filled 2017-03-27 (×3): qty 1

## 2017-03-27 MED ORDER — AMIODARONE HCL IN DEXTROSE 360-4.14 MG/200ML-% IV SOLN
60.0000 mg/h | INTRAVENOUS | Status: AC
Start: 2017-03-27 — End: 2017-03-27
  Administered 2017-03-27: 60 mg/h via INTRAVENOUS
  Filled 2017-03-27 (×2): qty 200

## 2017-03-27 MED ORDER — AMIODARONE HCL IN DEXTROSE 360-4.14 MG/200ML-% IV SOLN
30.0000 mg/h | INTRAVENOUS | Status: DC
  Administered 2017-03-27: 30 mg/h via INTRAVENOUS

## 2017-03-27 MED ORDER — FUROSEMIDE 10 MG/ML IJ SOLN
80.0000 mg | Freq: Two times a day (BID) | INTRAMUSCULAR | Status: DC
  Administered 2017-03-27 – 2017-03-30 (×7): 80 mg via INTRAVENOUS
  Filled 2017-03-27 (×9): qty 8

## 2017-03-27 NOTE — ED Notes (Signed)
Pt's breakfast tray at bedside.  

## 2017-03-27 NOTE — ED Notes (Signed)
Heart Healthy diet lunch tray ordered @ 1327.  

## 2017-03-27 NOTE — ED Notes (Signed)
Pt requesting coffee and breakfast. Informed pt of NPO status, pt expressed understanding at this time

## 2017-03-27 NOTE — Care Management Note (Signed)
Case Management Note  Patient Details  Name: Reginald Hahn MRN: [id number] Date of Birth: 06-16-1950  Subjective/Objective:                  66 y.o. male with h/o COPD admitted in 6/17 with acute systolic HF, AF with RVR and cardiogenic shock.LBBB. From home alone.  Action/Plan: Admit status INPATIENT (DYSPNEA); anticipate discharge HOME WITH SELF CARE VS HOME HEALTH.Marland Kitchen  Expected Discharge Date:  (unknown)               Expected Discharge Plan:  Home/Self Care  In-House Referral:     Discharge planning Services     Post Acute Care Choice:    Choice offered to:     DME Arranged:    DME Agency:     HH Arranged:    HH Agency:     Status of Service:  In process, will continue to follow  If discussed at Long Length of Stay Meetings, dates discussed:    Additional Comments: Possible THN canidate?  Oletta Cohn, RN 03/27/2017, 8:47 AM

## 2017-03-27 NOTE — ED Notes (Signed)
Pt wanting to know about eating, is very hungry. Will page cardiology to determine plan.

## 2017-03-27 NOTE — ED Notes (Addendum)
Heart Healthy diet breakfast tray ordered @ 0802.  

## 2017-03-27 NOTE — ED Notes (Signed)
Admitting MD - Dr. Ave Filter - called and asked to see if pt can eat. Dr. Ave Filter stated that the pt can eat and his diet order is heart healthy.

## 2017-03-27 NOTE — Progress Notes (Signed)
Advanced Heart Failure Rounding Note  Primary Cardiologist:  Dr. Gala Romney   Subjective:    Presented to [facility] overnight with worsening dyspnea and belly pain. Has had multitude of symptoms since getting "Pnuemovax" on 02/25/17.   Started on diltiazem overnight with improved weight.  WBC 12.1 this am.   Feeling somewhat better. Remains SOB with conversation and exertion. Legs swollen. Said he had subjective fever and chills at home over weekend.  Productive cough with clear/yellow sputum.   CXR 03/26/17 concerning for acute bronchitis.   Objective:   Weight Range:   Body mass index is 28.21 kg/m.   Vital Signs:   Temp:  [97.5 F (36.4 C)-97.8 F (36.6 C)] 97.5 F (36.4 C) (12/18 0715) Pulse Rate:  [37-155] 53 (12/18 0930) Resp:  [18-31] 26 (12/18 0930) BP: (95-141)/(51-112) 117/66 (12/18 0930) SpO2:  [90 %-98 %] 96 % (12/18 0930) FiO2 (%):  [0 %] 0 % (12/18 0233)    Weight change: There were no vitals filed for this visit.  Intake/Output:   Intake/Output Summary (Last 24 hours) at 03/27/2017 1029 Last data filed at 03/27/2017 0718 Gross per 24 hour  Intake 1000 ml  Output 400 ml  Net 600 ml      Physical Exam    General:  Fatigued appearing. No resp difficulty HEENT: Normal Neck: Supple. JVP 10 cm+. Carotids 2+ bilat; no bruits. No lymphadenopathy or thyromegaly appreciated. Cor: PMI nondisplaced. Irregularly irregular. Tachy No rubs, gallops or murmurs. Lungs: Diminished throughout with bibasilar crackles. Abdomen: Soft, Distended, non-tender, no hepatosplenomegaly. No bruits or masses. Good bowel sounds. Extremities: No cyanosis, clubbing, or rash. 1+ BLE edema.  Neuro: Alert & orientedx3, cranial nerves grossly intact. moves all 4 extremities w/o difficulty. Affect pleasant   Telemetry   A Fib 110-120s at rest, personally reviewed.   EKG    Afib RVR in 140s, personally reviewed.   Labs    CBC Recent Labs    03/26/17 1521 03/27/17 0047    WBC 10.5 12.1*  HGB 13.1 12.8*  HCT 40.3 39.0  MCV 91.8 91.8  PLT 215 196   Basic Metabolic Panel Recent Labs    00/37/04 1521 03/27/17 0047  NA 136  --   K 3.6  --   CL 100*  --   CO2 26  --   GLUCOSE 208*  --   BUN 20  --   CREATININE 1.15 1.05  CALCIUM 8.7*  --    Liver Function Tests No results for input(s): AST, ALT, ALKPHOS, BILITOT, PROT, ALBUMIN in the last 72 hours. No results for input(s): LIPASE, AMYLASE in the last 72 hours. Cardiac Enzymes No results for input(s): CKTOTAL, CKMB, CKMBINDEX, TROPONINI in the last 72 hours.  BNP: BNP (last 3 results) No results for input(s): BNP in the last 8760 hours.  ProBNP (last 3 results) No results for input(s): PROBNP in the last 8760 hours.   D-Dimer No results for input(s): DDIMER in the last 72 hours. Hemoglobin A1C No results for input(s): HGBA1C in the last 72 hours. Fasting Lipid Panel No results for input(s): CHOL, HDL, LDLCALC, TRIG, CHOLHDL, LDLDIRECT in the last 72 hours. Thyroid Function Tests No results for input(s): TSH, T4TOTAL, T3FREE, THYROIDAB in the last 72 hours.  Invalid input(s): FREET3  Other results:   Imaging    Dg Chest 2 View  Result Date: 03/26/2017 CLINICAL DATA:  66 year old male with flu-like symptoms. Chills, body aches, weakness and mild cough. History of COPD. Chest pain. EXAM:  CHEST  2 VIEW COMPARISON:  Chest x-ray 08/25/2016. FINDINGS: Diffuse peribronchial cuffing. No acute consolidative airspace disease. No pleural effusions. No definite suspicious appearing pulmonary nodules or masses. No evidence of pulmonary edema. Heart size is borderline enlarged. The patient is rotated to the right on today's exam, resulting in distortion of the mediastinal contours and reduced diagnostic sensitivity and specificity for mediastinal pathology. Atherosclerotic calcifications in the thoracic aorta. IMPRESSION: 1. Diffuse peribronchial cuffing, concerning for an acute bronchitis. 2. Aortic  atherosclerosis. Electronically Signed   By: Trudie Reed M.D.   On: 03/26/2017 16:41      Medications:     Scheduled Medications: . loratadine  10 mg Oral Daily  . mometasone-formoterol  2 puff Inhalation BID  . predniSONE  10 mg Oral Q breakfast  . Tiotropium Bromide Monohydrate  2 puff Inhalation Daily     Infusions: . diltiazem (CARDIZEM) infusion 15 mg/hr (03/27/17 0732)  . diltiazem (CARDIZEM) infusion 15 mg/hr (03/27/17 0306)     PRN Medications:  acetaminophen, acetaminophen, albuterol, ondansetron (ZOFRAN) IV    Patient Profile   Reginald Hahn is a 66 y.o. male with h/o COPD admitted in 6/17 with acute systolic HF and AF with RVR in the setting of likely viral illness.   Assessment/Plan   1.  PAF with RVR - Stop cardizem with CHF.  - Start amiodarone gtt for rate control.  - Has refused AC due to h/o RP bleed. Refuses S/Q heparin as well. 2. Acute on Chronic Systolic HF  - NICM. R/LHC 08/2016 with minimal non-obstructive CAD and EF  - Echo 2/18 EF 45-50% - Volume status elevated on exam. - Start lasix 80 mg IV BID. BMET pending today.  - Hold losartan for now.  - Resume spiro 12.5 mg daily with diuresis and mild hypokalemia.  - Have previously discussed tikosyn if patient returns to Afib. Consider EP referral.  - CHA2DS2/VASc is at least 5. Pt has refused AC with h/o of RP bleed.  3. h/o RP bleed - Have avoided heparin IV/SQ. (Pt also adamant to not receive this).  4. Tobacco use - Not smoking.  5. Transient loss of R peripheral vision - Concerning for embolic CVA in setting of AF - Pt has repeatedly declined Neuro referal, MRI, and/or carotid dopplers. No change.  6. HTN - Stable this am.  7. LBBB - Chronic.  Medication concerns reviewed with patient and pharmacy team. Barriers identified: None at this time.   Length of Stay: 1  Luane School  03/27/2017, 10:29 AM  Advanced Heart Failure Team Pager [phone] (M-F; 7a -  4p)  Please contact [facility] for night-coverage after hours (4p -7a ) and weekends on [email or web address]  Patient seen and examined with the above-signed Advanced Practice Provider and/or Housestaff. I personally reviewed laboratory data, imaging studies and relevant notes. I independently examined the patient and formulated the important aspects of the plan. I have edited the note to reflect any of my changes or salient points. I have personally discussed the plan with the patient and/or family.  Patient located outside ER saying he wanted to sign out AMA. With the help of his ex-wife we were able to convince him to stay.   We transferred him up to 6E. I performed bedside echo and EF 20%. Exam c/w with marked volume overload with probable low output. He remains in AF with RVR which was likely triggered by recent COPD flare/URI. Now with recurrent tachy-cardiomyopathy.   He refuses AC due  to previous life-threatening RP bleed that occurred in the same situation last admit. He also has concerns over long-term use of amiodarone (which worked well for him in the past) but we explained the need to use it again to help attain rate control. He realizes there is a risk of stroke in this situation which is likely increased by refusal of AC but is willing to proceed.   Will treat with IV amio and IV diuresis. Can add inotropes as needed. If EF recovers down the road can switch amio to flecainide for long-term AF prevention.   Total time spent 60 minutes. Over half that time spent discussing above.   Arvilla Meresaniel Keaira Whitehurst, MD  6:07 PM

## 2017-03-28 ENCOUNTER — Inpatient Hospital Stay (HOSPITAL_COMMUNITY): Payer: Medicare Other

## 2017-03-28 DIAGNOSIS — I361 Nonrheumatic tricuspid (valve) insufficiency: Secondary | ICD-10-CM

## 2017-03-28 DIAGNOSIS — J9601 Acute respiratory failure with hypoxia: Secondary | ICD-10-CM

## 2017-03-28 LAB — BASIC METABOLIC PANEL
ANION GAP: 15 (ref 5–15)
BUN: 32 mg/dL — ABNORMAL HIGH (ref 6–20)
CHLORIDE: 102 mmol/L (ref 101–111)
CO2: 19 mmol/L — ABNORMAL LOW (ref 22–32)
Calcium: 8.4 mg/dL — ABNORMAL LOW (ref 8.9–10.3)
Creatinine, Ser: 1.55 mg/dL — ABNORMAL HIGH (ref 0.61–1.24)
GFR calc non Af Amer: 45 mL/min — ABNORMAL LOW (ref >60)
GFR, EST AFRICAN AMERICAN: 53 mL/min — AB (ref >60)
Glucose, Bld: 130 mg/dL — ABNORMAL HIGH (ref 65–99)
POTASSIUM: 3.7 mmol/L (ref 3.5–5.1)
SODIUM: 136 mmol/L (ref 135–145)

## 2017-03-28 LAB — ECHOCARDIOGRAM COMPLETE
HEIGHTINCHES: 72 in
WEIGHTICAEL: 3102.4 [oz_av]

## 2017-03-28 LAB — MAGNESIUM: MAGNESIUM: 2.1 mg/dL (ref 1.7–2.4)

## 2017-03-28 MED ORDER — AMOXICILLIN-POT CLAVULANATE 875-125 MG PO TABS
1.0000 | ORAL_TABLET | Freq: Two times a day (BID) | ORAL | Status: DC
  Administered 2017-03-28: 1 via ORAL
  Filled 2017-03-28 (×2): qty 1

## 2017-03-28 MED ORDER — AMIODARONE LOAD VIA INFUSION
150.0000 mg | Freq: Once | INTRAVENOUS | Status: AC
  Administered 2017-03-28: 150 mg via INTRAVENOUS
  Filled 2017-03-28: qty 83.34

## 2017-03-28 MED ORDER — PREDNISONE 20 MG PO TABS
20.0000 mg | ORAL_TABLET | Freq: Every day | ORAL | Status: DC
  Administered 2017-03-29: 20 mg via ORAL
  Filled 2017-03-28: qty 1

## 2017-03-28 MED ORDER — TRAZODONE HCL 50 MG PO TABS
50.0000 mg | ORAL_TABLET | Freq: Every evening | ORAL | Status: DC | PRN
  Administered 2017-03-28 – 2017-04-09 (×6): 50 mg via ORAL
  Filled 2017-03-28 (×7): qty 1

## 2017-03-28 NOTE — Progress Notes (Signed)
  Echocardiogram 2D Echocardiogram has been performed.  Danajah Birdsell T Bishop Vanderwerf 03/28/2017, 1:30 PM

## 2017-03-28 NOTE — Progress Notes (Signed)
Pt refused to take evening dose of Augmentin. States "could have been given at 5 in the afternoon" Education provided on the rationale of taking antibiotic and missing a dose would affect the course of treatment. Refused teaching and became irritable and verbally abusive. Requesting sleep aid, Dr Cristina Gong paged and made aware. Order received. Elvera Bicker, BSN, RN.

## 2017-03-28 NOTE — Progress Notes (Addendum)
Advanced Heart Failure Rounding Note  Primary Cardiologist:  Dr. Gala Romney   Subjective:    Started on amio gtt 03/27/17  Feeling somewhat better this am. Remains in AF. Still tachy. Coughing up clear to yellow phlegm. Swelling somewhat improved.   Creatinine 1.0 -> 1.34 -> 1.55. Unclear weight change, because he was only weighed for the first time this am.   CXR 03/26/17 concerning for acute bronchitis.   Objective:   Weight Range: 193 lb 14.4 oz (88 kg) Body mass index is 26.3 kg/m.   Vital Signs:   Temp:  [97.6 F (36.4 C)-97.9 F (36.6 C)] 97.9 F (36.6 C) (12/19 0457) Pulse Rate:  [30-122] 111 (12/19 0457) Resp:  [14-33] 21 (12/19 0457) BP: (95-135)/(60-99) 95/81 (12/19 0457) SpO2:  [92 %-98 %] 98 % (12/19 0457) Weight:  [193 lb 14.4 oz (88 kg)] 193 lb 14.4 oz (88 kg) (12/19 0457)   Weight change: Filed Weights   03/28/17 0457  Weight: 193 lb 14.4 oz (88 kg)   Intake/Output:   Intake/Output Summary (Last 24 hours) at 03/28/2017 0955 Last data filed at 03/28/2017 0700 Gross per 24 hour  Intake 267.36 ml  Output 550 ml  Net -282.64 ml    Physical Exam    General: Sitting up in bed No resp difficulty. HEENT: Normal Neck: Supple. JVP to jaw. Carotids 2+ bilat; no bruits. No thyromegaly or nodule noted. Cor: PMI nondisplaced. Irregularly irregular. Tachy  Lungs: Diminished basilar sounds, Tight no wheeze Abdomen: Soft, non-tender, non-distended, no HSM. No bruits or masses. +BS  Extremities: No cyanosis, clubbing, or rash. 1+ BLE edema.  Neuro: Alert & orientedx3, cranial nerves grossly intact. moves all 4 extremities w/o difficulty. Affect pleasant   Telemetry   Afib 110-120s Personally reviewed   EKG   No new tracings.   Labs    CBC Recent Labs    03/26/17 1521 03/27/17 0047  WBC 10.5 12.1*  HGB 13.1 12.8*  HCT 40.3 39.0  MCV 91.8 91.8  PLT 215 196   Basic Metabolic Panel Recent Labs    54/56/25 1704 03/28/17 0347  NA 136  136  K 3.5 3.7  CL 102 102  CO2 22 19*  GLUCOSE 204* 130*  BUN 26* 32*  CREATININE 1.34* 1.55*  CALCIUM 8.4* 8.4*  MG  --  2.1   Liver Function Tests No results for input(s): AST, ALT, ALKPHOS, BILITOT, PROT, ALBUMIN in the last 72 hours. No results for input(s): LIPASE, AMYLASE in the last 72 hours. Cardiac Enzymes No results for input(s): CKTOTAL, CKMB, CKMBINDEX, TROPONINI in the last 72 hours.  BNP: BNP (last 3 results) No results for input(s): BNP in the last 8760 hours.  ProBNP (last 3 results) No results for input(s): PROBNP in the last 8760 hours.   D-Dimer No results for input(s): DDIMER in the last 72 hours. Hemoglobin A1C No results for input(s): HGBA1C in the last 72 hours. Fasting Lipid Panel No results for input(s): CHOL, HDL, LDLCALC, TRIG, CHOLHDL, LDLDIRECT in the last 72 hours. Thyroid Function Tests No results for input(s): TSH, T4TOTAL, T3FREE, THYROIDAB in the last 72 hours.  Invalid input(s): FREET3  Other results:   Imaging    No results found.   Medications:     Scheduled Medications: . furosemide  80 mg Intravenous BID  . loratadine  10 mg Oral Daily  . mometasone-formoterol  2 puff Inhalation BID  . predniSONE  10 mg Oral Q breakfast  . tiotropium  18 mcg  Inhalation Daily    Infusions: . amiodarone 30 mg/hr (03/27/17 2301)    PRN Medications: acetaminophen, albuterol, ondansetron (ZOFRAN) IV  Patient Profile   Reginald Hahn is a 66 y.o. male with h/o COPD admitted in 6/17 with acute systolic HF and AF with RVR in the setting of likely viral illness.   Assessment/Plan   1.  PAF with RVR - Now off cardizem with CHF.  - Continue amiodarone gtt for rate control. Will limit use and try to only have on several months (po) until EF improves. Will bolus amio this am.  - Has refused AC due to h/o RP bleed. Refuses S/Q heparin as well. - Can consider flecainide in the future if EF recovers with Afib control.  2. Acute on  Chronic Systolic HF  - NICM. R/LHC 08/2016 with minimal non-obstructive CAD and EF  - Echo 2/18 EF 45-50% - Volume status remains elevated on exam.  - Continue lasix 80 mg IV BID.  - Hold losartan for now.  - Resume spiro 12.5 mg daily with diuresis and mild hypokalemia.  - Have previously discussed tikosyn if patient returns to Afib. Consider EP referral.  - CHA2DS2/VASc is at least 5. Pt has refused AC with h/o of RP bleed.  3. h/o RP bleed - Have avoided heparin IV/SQ. (Pt also adamant to not receive this).  - Pt realizes there is a risk of stroke in using amiodarone with possibility of conversion to NSR, which is likely increased by his refusal of AC. He has verbalized understanding and agrees to proceed. Continues to refuse AC.  4. Tobacco use - No longer smoking.  5. Transient loss of R peripheral vision - Concerning for embolic CVA in setting of AF - Pt has repeatedly declined Neuro referal, MRI, and/or carotid dopplers. No change.   6. HTN - Will adjust meds in setting of HF flare and Afib.  7. LBBB - Chronic 8. AKI - Creatinine trending up.  - Continue to follow closely with diuresis. May need inotropes 9. AECOPD - Will add nebs - Increase prednisone to 20 mg daily.  - Will use augmentin with QTc prolongation.  Medication concerns reviewed with patient and pharmacy team. Barriers identified: None at this time apart from refusal of AC.   Length of Stay: 2  Luane School  03/28/2017, 9:55 AM  Advanced Heart Failure Team Pager [phone] (M-F; 7a - 4p)  Please contact [facility] for night-coverage after hours (4p -7a ) and weekends on [email or web address]  Remains tenuous. Still in AF with RVR despite IV amio. Will continue to load amio. Refuses AC due to previous life-threatening RP bleed. Volume status remains elevated. Will continue IV lasix. Creatinine is climbing so will have to follow closely and be cognizant of possible low output state. Would also treat for  COPD flare.  Echo images reviewed personally. EF 20-25%.   Arvilla Meres, MD  7:20 PM

## 2017-03-28 NOTE — Care Management Important Message (Signed)
Important Message  Patient Details  Name: Reginald Hahn MRN: [id number] Date of Birth: Sep 27, 1950   Medicare Important Message Given:  Yes    Jayr Lupercio Stefan Church 03/28/2017, 3:37 PM

## 2017-03-29 ENCOUNTER — Inpatient Hospital Stay (HOSPITAL_COMMUNITY): Payer: Medicare Other

## 2017-03-29 DIAGNOSIS — R570 Cardiogenic shock: Secondary | ICD-10-CM

## 2017-03-29 LAB — CBC WITH DIFFERENTIAL/PLATELET
BASOS ABS: 0 10*3/uL (ref 0.0–0.1)
Basophils Relative: 0 %
EOS PCT: 0 %
Eosinophils Absolute: 0 10*3/uL (ref 0.0–0.7)
HEMATOCRIT: 38.6 % — AB (ref 39.0–52.0)
Hemoglobin: 12.7 g/dL — ABNORMAL LOW (ref 13.0–17.0)
Lymphocytes Relative: 11 %
Lymphs Abs: 2 10*3/uL (ref 0.7–4.0)
MCH: 30.2 pg (ref 26.0–34.0)
MCHC: 32.9 g/dL (ref 30.0–36.0)
MCV: 91.7 fL (ref 78.0–100.0)
MONO ABS: 0.5 10*3/uL (ref 0.1–1.0)
MONOS PCT: 3 %
NEUTROS PCT: 86 %
Neutro Abs: 15.4 10*3/uL — ABNORMAL HIGH (ref 1.7–7.7)
Platelets: 186 10*3/uL (ref 150–400)
RBC: 4.21 MIL/uL — AB (ref 4.22–5.81)
RDW: 15.2 % (ref 11.5–15.5)
WBC: 17.9 10*3/uL — AB (ref 4.0–10.5)

## 2017-03-29 LAB — BASIC METABOLIC PANEL
Anion gap: 14 (ref 5–15)
BUN: 46 mg/dL — ABNORMAL HIGH (ref 6–20)
CHLORIDE: 95 mmol/L — AB (ref 101–111)
CO2: 23 mmol/L (ref 22–32)
CREATININE: 2.59 mg/dL — AB (ref 0.61–1.24)
Calcium: 8.2 mg/dL — ABNORMAL LOW (ref 8.9–10.3)
GFR calc non Af Amer: 24 mL/min — ABNORMAL LOW (ref >60)
GFR, EST AFRICAN AMERICAN: 28 mL/min — AB (ref >60)
Glucose, Bld: 165 mg/dL — ABNORMAL HIGH (ref 65–99)
POTASSIUM: 4 mmol/L (ref 3.5–5.1)
SODIUM: 132 mmol/L — AB (ref 135–145)

## 2017-03-29 LAB — COOXEMETRY PANEL
CARBOXYHEMOGLOBIN: 1.2 % (ref 0.5–1.5)
Carboxyhemoglobin: 0.5 % (ref 0.5–1.5)
METHEMOGLOBIN: 0.9 % (ref 0.0–1.5)
Methemoglobin: 1.2 % (ref 0.0–1.5)
O2 SAT: 29.2 %
O2 Saturation: 58.1 %
TOTAL HEMOGLOBIN: 13.1 g/dL (ref 12.0–16.0)
TOTAL HEMOGLOBIN: 12.4 g/dL (ref 12.0–16.0)

## 2017-03-29 LAB — GLUCOSE, CAPILLARY: GLUCOSE-CAPILLARY: 152 mg/dL — AB (ref 65–99)

## 2017-03-29 MED ORDER — AMIODARONE LOAD VIA INFUSION
150.0000 mg | Freq: Once | INTRAVENOUS | Status: AC
  Administered 2017-03-29: 150 mg via INTRAVENOUS
  Filled 2017-03-29: qty 83.34

## 2017-03-29 MED ORDER — DOXYCYCLINE HYCLATE 100 MG PO TABS
100.0000 mg | ORAL_TABLET | Freq: Two times a day (BID) | ORAL | Status: DC
  Administered 2017-03-29 – 2017-03-31 (×5): 100 mg via ORAL
  Filled 2017-03-29 (×6): qty 1

## 2017-03-29 MED ORDER — SODIUM CHLORIDE 0.9% FLUSH
10.0000 mL | INTRAVENOUS | Status: DC | PRN
  Administered 2017-04-07: 40 mL
  Filled 2017-03-29: qty 40

## 2017-03-29 MED ORDER — PREDNISONE 10 MG PO TABS
10.0000 mg | ORAL_TABLET | Freq: Every day | ORAL | Status: DC
  Administered 2017-03-30 – 2017-04-04 (×6): 10 mg via ORAL
  Filled 2017-03-29 (×6): qty 1

## 2017-03-29 MED ORDER — SODIUM CHLORIDE 0.9% FLUSH
10.0000 mL | Freq: Two times a day (BID) | INTRAVENOUS | Status: DC
  Administered 2017-03-29: 20 mL
  Administered 2017-03-29 – 2017-04-05 (×11): 10 mL
  Administered 2017-04-06: 30 mL
  Administered 2017-04-06 – 2017-04-10 (×5): 10 mL

## 2017-03-29 MED ORDER — MILRINONE LACTATE IN DEXTROSE 20-5 MG/100ML-% IV SOLN
0.3750 ug/kg/min | INTRAVENOUS | Status: DC
  Administered 2017-03-29: 0.25 ug/kg/min via INTRAVENOUS
  Administered 2017-03-29 – 2017-04-03 (×13): 0.375 ug/kg/min via INTRAVENOUS
  Filled 2017-03-29 (×14): qty 100

## 2017-03-29 NOTE — Progress Notes (Signed)
Peripherally Inserted Central Catheter/Midline Placement  The IV Nurse has discussed with the patient and/or persons authorized to consent for the patient, the purpose of this procedure and the potential benefits and risks involved with this procedure.  The benefits include less needle sticks, lab draws from the catheter, and the patient may be discharged home with the catheter. Risks include, but not limited to, infection, bleeding, blood clot (thrombus formation), and puncture of an artery; nerve damage and irregular heartbeat and possibility to perform a PICC exchange if needed/ordered by physician.  Alternatives to this procedure were also discussed.  Bard Power PICC patient education guide, fact sheet on infection prevention and patient information card has been provided to patient /or left at bedside.    PICC/Midline Placement Documentation  PICC Double Lumen 03/29/17 PICC Right Basilic 43 cm 0 cm (Active)  Indication for Insertion or Continuance of Line Prolonged intravenous therapies 03/29/2017  1:30 PM  Exposed Catheter (cm) 0 cm 03/29/2017  1:30 PM  Site Assessment Clean;Dry;Intact 03/29/2017  1:30 PM  Lumen #1 Status Flushed;Saline locked;Blood return noted 03/29/2017  1:30 PM  Lumen #2 Status Flushed;Saline locked;Blood return noted 03/29/2017  1:30 PM  Dressing Type Transparent;Securing device 03/29/2017  1:30 PM  Dressing Status Clean;Dry;Intact;Antimicrobial disc in place 03/29/2017  1:30 PM  Dressing Change Due 04/05/17 03/29/2017  1:30 PM       Romie Jumper 03/29/2017, 2:44 PM

## 2017-03-29 NOTE — Progress Notes (Signed)
Advanced Heart Failure Rounding Note  Primary Cardiologist:  Dr. Gala RomneyBensimhon   Subjective:    Started on amio gtt 03/27/17  Feeling considerably worse this am. Had been in NSR for large part of the afternoon, but overnight went back into Afib. C/o poor appetite, fatigue, and nausea. States he vomited 5 times yesterday after am dose of Augmentin, refuses further.   Creatinine 1.0 -> 1.34 -> 1.55 -> 2.55. Only 200 cc UOP recorded yesterday. Weight up considerably, but bed weight. Have asked for standing weights.    CXR 03/26/17 concerning for acute bronchitis.   Objective:   Weight Range: 216 lb 14.4 oz (98.4 kg) Body mass index is 29.42 kg/m.   Vital Signs:   Temp:  [97.3 F (36.3 C)-98.6 F (37 C)] 97.3 F (36.3 C) (12/20 0558) Pulse Rate:  [85-112] 105 (12/20 0558) Resp:  [25-28] 28 (12/20 0558) BP: (104-116)/(80-90) 110/85 (12/20 0558) SpO2:  [93 %-100 %] 100 % (12/20 0558) FiO2 (%):  [100 %] 100 % (12/20 0839) Weight:  [216 lb 14.4 oz (98.4 kg)] 216 lb 14.4 oz (98.4 kg) (12/20 0558) Last BM Date: 03/28/17 Weight change: Filed Weights   03/28/17 0457 03/29/17 0558  Weight: 193 lb 14.4 oz (88 kg) 216 lb 14.4 oz (98.4 kg)   Intake/Output:   Intake/Output Summary (Last 24 hours) at 03/29/2017 0953 Last data filed at 03/29/2017 0600 Gross per 24 hour  Intake 1189.3 ml  Output 200 ml  Net 989.3 ml    Physical Exam    General: Fatigued and ill appearing. NAD.  HEENT: Normal Neck: Supple. JVP to jaw. Carotids 2+ bilat; no bruits. No thyromegaly or nodule noted. Cor: PMI nondisplaced. Irregularly irregular, tachy. ?S3 Lungs: Diminished throughout, basilar crackles.  Abdomen: Soft, non-tender, non-distended, no HSM. No bruits or masses. +BS  Extremities: No cyanosis, clubbing, or rash. Feet cool to the touch with 1-2+ edema.  Neuro: Alert & orientedx3, cranial nerves grossly intact. moves all 4 extremities w/o difficulty. Affect flat.  Telemetry   Afib  110-120s this am, Was in NSR for a portion of the day into the evening yesterday, but back into rapid Afib overnight.   EKG   No new tracings.   Labs    CBC Recent Labs    03/27/17 0047 03/29/17 0613  WBC 12.1* 17.9*  NEUTROABS  --  15.4*  HGB 12.8* 12.7*  HCT 39.0 38.6*  MCV 91.8 91.7  PLT 196 186   Basic Metabolic Panel Recent Labs    13/24/4012/19/18 0347 03/29/17 0613  NA 136 132*  K 3.7 4.0  CL 102 95*  CO2 19* 23  GLUCOSE 130* 165*  BUN 32* 46*  CREATININE 1.55* 2.59*  CALCIUM 8.4* 8.2*  MG 2.1  --    Liver Function Tests No results for input(s): AST, ALT, ALKPHOS, BILITOT, PROT, ALBUMIN in the last 72 hours. No results for input(s): LIPASE, AMYLASE in the last 72 hours. Cardiac Enzymes No results for input(s): CKTOTAL, CKMB, CKMBINDEX, TROPONINI in the last 72 hours.  BNP: BNP (last 3 results) No results for input(s): BNP in the last 8760 hours.  ProBNP (last 3 results) No results for input(s): PROBNP in the last 8760 hours.   D-Dimer No results for input(s): DDIMER in the last 72 hours. Hemoglobin A1C No results for input(s): HGBA1C in the last 72 hours. Fasting Lipid Panel No results for input(s): CHOL, HDL, LDLCALC, TRIG, CHOLHDL, LDLDIRECT in the last 72 hours. Thyroid Function Tests No results for  input(s): TSH, T4TOTAL, T3FREE, THYROIDAB in the last 72 hours.  Invalid input(s): FREET3  Other results:   Imaging    No results found.   Medications:     Scheduled Medications: . amiodarone  150 mg Intravenous Once  . amoxicillin-clavulanate  1 tablet Oral Q12H  . furosemide  80 mg Intravenous BID  . loratadine  10 mg Oral Daily  . mometasone-formoterol  2 puff Inhalation BID  . predniSONE  20 mg Oral Q breakfast  . tiotropium  18 mcg Inhalation Daily    Infusions: . amiodarone 30 mg/hr (03/28/17 2353)  . milrinone      PRN Medications: acetaminophen, albuterol, ondansetron (ZOFRAN) IV, traZODone  Patient Profile   Reginald Hahn is a 66 y.o. male with h/o COPD admitted in 6/17 with acute systolic HF and AF with RVR in the setting of likely viral illness.   Assessment/Plan   1.  PAF with RVR - Now off cardizem with CHF.  - Continue amiodarone gtt for rate control. Will limit use and try to only have on several months (po) until EF improves.  - He had gone back into NSR yesterday afternoon but flipped back overnight.  - Re-bolus amiodarone 150 mg IV.  - Can consider flecainide in the future if EF recovers with Afib control.  2. Acute on Chronic Systolic HF  - NICM. R/LHC 08/2016 with minimal non-obstructive CAD and EF  - Echo 03/28/17 EF 20-25%, Trivial AI, Mild RAE, Pa peak pressure 35 mm Hg. Worsened from 05/2016 where EF was 45-50%.  - Volume status markedly elevated in the setting of low output HF.  - Start milrinone 0.25 mcg/kg/min.  - Check stat coox and CVP once PICC line placed.  - Continue lasix 80 mg IV BID for now. Check CVP after PICC placed.  - Hold losartan for now.  - Stop spiro with ARF.  - Have previously discussed tikosyn if patient returns to Afib. Consider EP referral.  - CHA2DS2/VASc is at least 5. Pt has refused AC with h/o of RP bleed.  3. h/o RP bleed - Have avoided heparin IV/SQ. (Pt also adamant to not receive this).  - Pt realizes there is a risk of stroke in using amiodarone with possibility of conversion to NSR, which is likely increased by his refusal of AC. He has verbalized understanding and agrees to proceed. Continues to refuse AC. No change.  4. Tobacco use - No longer use.  5. Transient loss of R peripheral vision - Concerning for embolic CVA in setting of AF - Pt has repeatedly declined Neuro referal, MRI, and/or carotid dopplers. No change.  6. HTN - Will adjust meds in setting of HF flare and Afib. Watch closely with low output.  7. LBBB - Chronic.  8. ARF - Creatinine trending up again this am. Starting on milrinone as above.  9. AECOPD - Continue nebs. -  Refuses prednisone 20, will take back to 10 mg daily.  - Refuses augmentin. Will use doxycycline for now.   Patient is critically ill and in imminent danger of multiple organ failure.  Renal function worsening with low output HF. Placing PICC and starting milrinone as above.   Medication concerns reviewed with patient and pharmacy team. Barriers identified: Compliance.   Length of Stay: 3  Luane School  03/29/2017, 9:53 AM  Advanced Heart Failure Team Pager [phone] (M-F; 7a - 4p)  Please contact [facility] for night-coverage after hours (4p -7a ) and weekends on [email or web address]  Agree with above.   He is critically ill with evidence of low output  HF and cardiogenic shock with co-ox 29%. Was in NSr last night but now back in AF with RVR. Renal function worsening. Only moderate diuresis. Remains quite SOB and weak. Continues to refuse AC  On exam Dyspneic JVP to jaw Cor IRR tachy + s3 Lungs + crackles  Ab distended NT Ext cool 1+ edema RUE PICC  He continues to deteriorate with low output HF in the setting of AF with RVR and recurrent severe LV dysfunction. Will start milrinone. Continue IV amiodarone and diuresis. He refuses AC. Hopefully he will revert to NSR with amio. Continue treatment for COPD flare which likely triggered AF.   CRITICAL CARE Performed by: Arvilla Meres  Total critical care time: 35 minutes  Critical care time was exclusive of separately billable procedures and treating other patients.  Critical care was necessary to treat or prevent imminent or life-threatening deterioration.  Critical care was time spent personally by me (independent of midlevel providers or residents) on the following activities: development of treatment plan with patient and/or surrogate as well as nursing, discussions with consultants, evaluation of patient's response to treatment, examination of patient, obtaining history from patient or surrogate, ordering and  performing treatments and interventions, ordering and review of laboratory studies, ordering and review of radiographic studies, pulse oximetry and re-evaluation of patient's condition.  Arvilla Meres, MD  5:23 PM

## 2017-03-29 NOTE — Progress Notes (Signed)
Initial coox 29%.  Increase milrinone to 0.375 mcg/kg/min.    Repeat coox this evening.     Casimiro Needle [address]" [place], [place] 03/29/2017 3:33 PM

## 2017-03-30 LAB — BASIC METABOLIC PANEL
ANION GAP: 9 (ref 5–15)
BUN: 50 mg/dL — ABNORMAL HIGH (ref 6–20)
CHLORIDE: 95 mmol/L — AB (ref 101–111)
CO2: 27 mmol/L (ref 22–32)
Calcium: 8 mg/dL — ABNORMAL LOW (ref 8.9–10.3)
Creatinine, Ser: 2 mg/dL — ABNORMAL HIGH (ref 0.61–1.24)
GFR calc non Af Amer: 33 mL/min — ABNORMAL LOW (ref >60)
GFR, EST AFRICAN AMERICAN: 39 mL/min — AB (ref >60)
Glucose, Bld: 134 mg/dL — ABNORMAL HIGH (ref 65–99)
POTASSIUM: 3 mmol/L — AB (ref 3.5–5.1)
Sodium: 131 mmol/L — ABNORMAL LOW (ref 135–145)

## 2017-03-30 LAB — COOXEMETRY PANEL
Carboxyhemoglobin: 0.9 % (ref 0.5–1.5)
Methemoglobin: 1.3 % (ref 0.0–1.5)
O2 Saturation: 52.4 %
Total hemoglobin: 11.7 g/dL — ABNORMAL LOW (ref 12.0–16.0)

## 2017-03-30 MED ORDER — POTASSIUM CHLORIDE CRYS ER 20 MEQ PO TBCR
40.0000 meq | EXTENDED_RELEASE_TABLET | Freq: Two times a day (BID) | ORAL | Status: DC
  Administered 2017-03-30: 40 meq via ORAL
  Filled 2017-03-30: qty 2

## 2017-03-30 MED ORDER — FUROSEMIDE 10 MG/ML IJ SOLN
80.0000 mg | Freq: Three times a day (TID) | INTRAMUSCULAR | Status: DC
  Administered 2017-03-30 – 2017-04-03 (×14): 80 mg via INTRAVENOUS
  Filled 2017-03-30 (×15): qty 8

## 2017-03-30 MED ORDER — AMIODARONE LOAD VIA INFUSION
150.0000 mg | Freq: Once | INTRAVENOUS | Status: AC
  Administered 2017-03-30: 150 mg via INTRAVENOUS
  Filled 2017-03-30: qty 83.34

## 2017-03-30 MED ORDER — POTASSIUM CHLORIDE CRYS ER 20 MEQ PO TBCR
40.0000 meq | EXTENDED_RELEASE_TABLET | ORAL | Status: AC
  Administered 2017-03-30 (×2): 40 meq via ORAL
  Filled 2017-03-30 (×2): qty 2

## 2017-03-30 MED ORDER — AMIODARONE HCL IN DEXTROSE 360-4.14 MG/200ML-% IV SOLN
60.0000 mg/h | INTRAVENOUS | Status: DC
  Administered 2017-03-30 – 2017-04-06 (×26): 60 mg/h via INTRAVENOUS
  Filled 2017-03-30 (×28): qty 200

## 2017-03-30 NOTE — Progress Notes (Signed)
Advanced Heart Failure Rounding Note  Primary Cardiologist:  Dr. Gala Romney   Subjective:    Remains on amio 30 mg per hour. Yesterday milrinone increased to 0.375 mcg. CO-OX improved from 29>52%. Continues to diurese 80 mg IV lasix twice daily. Creatinine trending down from 2.5>2.0.   Feeling better today but still very weak. Denies SOB at rest. Has not ambulated much.   Remains in AF with RVR  CXR 03/26/17 concerning for acute bronchitis.   Objective:   Weight Range: 216 lb 12.8 oz (98.3 kg) Body mass index is 29.4 kg/m.   Vital Signs:   Temp:  [97.8 F (36.6 C)-98.1 F (36.7 C)] 97.9 F (36.6 C) (12/21 1345) Pulse Rate:  [55-120] 98 (12/21 1345) Resp:  [21-26] 22 (12/21 0559) BP: (97-137)/(70-88) 120/76 (12/21 1345) SpO2:  [90 %-100 %] 100 % (12/21 0559) Weight:  [216 lb 12.8 oz (98.3 kg)] 216 lb 12.8 oz (98.3 kg) (12/21 0559) Last BM Date: 03/28/17 Weight change: Filed Weights   03/29/17 0558 03/30/17 0500 03/30/17 0559  Weight: 216 lb 14.4 oz (98.4 kg) 216 lb 12.8 oz (98.3 kg) 216 lb 12.8 oz (98.3 kg)   Intake/Output:   Intake/Output Summary (Last 24 hours) at 03/30/2017 1358 Last data filed at 03/30/2017 0600 Gross per 24 hour  Intake 1404.45 ml  Output 1575 ml  Net -170.55 ml    Physical Exam   CVP 17-18.  General:  No resp difficulty. In bed.  HEENT: normal Neck: supple. JVP to ear. Carotids 2+ bilat; no bruits. No lymphadenopathy or thryomegaly appreciated. Cor: PMI laterally displaced. Tachy Irregular rate & rhythm. +s3 Lungs: Decreased in the bases. + crackles On 3 liters  Abdomen: soft, nontender, nondistended. No hepatosplenomegaly. No bruits or masses. Good bowel sounds. Extremities: no cyanosis, clubbing, rash, R and LLE 2-3+ edema. Extremities warm. RUE PICC.  Neuro: alert & orientedx3, cranial nerves grossly intact. moves all 4 extremities w/o difficulty. Affect pleasant   Telemetry  A fib RVR 110-120s personally reviewed.   EKG   No  new tracings.   Labs    CBC Recent Labs    03/29/17 0613  WBC 17.9*  NEUTROABS 15.4*  HGB 12.7*  HCT 38.6*  MCV 91.7  PLT 186   Basic Metabolic Panel Recent Labs    70/17/79 0347 03/29/17 0613 03/30/17 0535  NA 136 132* 131*  K 3.7 4.0 3.0*  CL 102 95* 95*  CO2 19* 23 27  GLUCOSE 130* 165* 134*  BUN 32* 46* 50*  CREATININE 1.55* 2.59* 2.00*  CALCIUM 8.4* 8.2* 8.0*  MG 2.1  --   --    Liver Function Tests No results for input(s): AST, ALT, ALKPHOS, BILITOT, PROT, ALBUMIN in the last 72 hours. No results for input(s): LIPASE, AMYLASE in the last 72 hours. Cardiac Enzymes No results for input(s): CKTOTAL, CKMB, CKMBINDEX, TROPONINI in the last 72 hours.  BNP: BNP (last 3 results) No results for input(s): BNP in the last 8760 hours.  ProBNP (last 3 results) No results for input(s): PROBNP in the last 8760 hours.   D-Dimer No results for input(s): DDIMER in the last 72 hours. Hemoglobin A1C No results for input(s): HGBA1C in the last 72 hours. Fasting Lipid Panel No results for input(s): CHOL, HDL, LDLCALC, TRIG, CHOLHDL, LDLDIRECT in the last 72 hours. Thyroid Function Tests No results for input(s): TSH, T4TOTAL, T3FREE, THYROIDAB in the last 72 hours.  Invalid input(s): FREET3  Other results:   Imaging  Dg Chest Port 1 View  Result Date: 03/29/2017 CLINICAL DATA:  Right PICC line placement EXAM: PORTABLE CHEST 1 VIEW COMPARISON:  03/26/2016 FINDINGS: Cardiomegaly noted. A right PICC line is present with tip overlying the lower SVC. Peribronchial thickening and mild left basilar atelectasis again noted. There is no evidence of airspace disease, pleural effusion or pneumothorax. IMPRESSION: Right PICC line with tip overlying the lower SVC. Electronically Signed   By: Harmon PierJeffrey  Hu M.D.   On: 03/29/2017 14:16     Medications:     Scheduled Medications: . doxycycline  100 mg Oral Q12H  . furosemide  80 mg Intravenous BID  . loratadine  10 mg Oral  Daily  . mometasone-formoterol  2 puff Inhalation BID  . potassium chloride  40 mEq Oral BID  . predniSONE  10 mg Oral Q breakfast  . sodium chloride flush  10-40 mL Intracatheter Q12H  . tiotropium  18 mcg Inhalation Daily    Infusions: . amiodarone 30 mg/hr (03/30/17 0700)  . milrinone 0.375 mcg/kg/min (03/30/17 0714)    PRN Medications: acetaminophen, albuterol, ondansetron (ZOFRAN) IV, sodium chloride flush, traZODone  Patient Profile   Fabiola BackerRichard Romaniello is a 66 y.o. male with h/o COPD admitted in 6/17 with acute systolic HF and AF with RVR in the setting of likely viral illness.   Assessment/Plan   1.  PAF with RVR - Off  cardizem with low EF.  - Rate uncontrolled. Give 150 mg amio bolus now.  - Continue amio 30 mg per hour.  - Refuses anticoagulant.  -- Can consider flecainide in the future if EF recovers with Afib control.  2. Acute on Chronic Systolic HF  - NICM. R/LHC 08/2016 with minimal non-obstructive CAD and EF  - Echo 03/28/17 EF 20-25%, Trivial AI, Mild RAE, Pa peak pressure 35 mm Hg. Worsened from 05/2016 where EF was 45-50%.  CVP 17-18. Increase lasix to 80 mg three times a day.  - CO-OX marginal. Increase milrinone 0.375 mcg.  - Stop spiro with ARF.  - Have previously discussed tikosyn if patient returns to Afib. Consider EP referral.  - CHA2DS2/VASc is at least 5. Pt has refused AC with h/o of RP bleed.  3. h/o RP bleed - Have avoided heparin IV/SQ. (Pt also adamant to not receive this).  - Pt realizes there is a risk of stroke in using amiodarone with possibility of conversion to NSR, which is likely increased by his refusal of AC. He has verbalized understanding and agrees to proceed. Continues to refuse AC. No change.  4. Tobacco use - No longer use.  5. Transient loss of R peripheral vision - Concerning for embolic CVA in setting of AF - Pt has repeatedly declined Neuro referal, MRI, and/or carotid dopplers. No change.  6. HTN - Will adjust meds in  setting of HF flare and Afib. Watch closely with low output.  7. LBBB - Chronic.  8. ARF - Creatinine trending down 2.5>2.   9. AECOPD - Continue nebs. - Refuses prednisone 20, will take back to 10 mg daily.  - Refuses augmentin. Will use doxycycline for now.  10.  Hypokalemia Supp K.  11. DVT Prophylaxis- add ted hose + SCDs. .   If remains difficult to diurese or worsens will likely need need Palliative Care.   Add ted hose and + SCDs Medication concerns reviewed with patient and pharmacy team. Barriers identified: Compliance.   Length of Stay: 4  Amy Clegg, NP  03/30/2017, 1:58 PM  Advanced Heart  Failure Team Pager [phone] (M-F; 7a - 4p)  Please contact [facility] for night-coverage after hours (4p -7a ) and weekends on [email or web address]  Patient seen and examined with Tonye Becket, NP. We discussed all aspects of the encounter. I agree with the assessment and plan as stated above.   He remains very tenuous. Co-ox yesterday c/w profound cardiogenic shock in setting of PAF with RVR and EF 20%. Now on milrinone and co-ox up to 52% but still marginal. Beginning to diurese. Renal function slightly better. Remains on treatment for AECOPD as well which likely triggered COPD.   Long discussion with him and family about severity of situation and need to restore NSR. He continues to refuse [facility] due to previous life-threatening RP bleed in similar situation several years ago.   Arvilla Meres, MD  8:19 PM

## 2017-03-31 DIAGNOSIS — E876 Hypokalemia: Secondary | ICD-10-CM

## 2017-03-31 LAB — BASIC METABOLIC PANEL
Anion gap: 10 (ref 5–15)
BUN: 45 mg/dL — AB (ref 6–20)
CALCIUM: 8.1 mg/dL — AB (ref 8.9–10.3)
CO2: 28 mmol/L (ref 22–32)
CREATININE: 1.66 mg/dL — AB (ref 0.61–1.24)
Chloride: 94 mmol/L — ABNORMAL LOW (ref 101–111)
GFR calc non Af Amer: 42 mL/min — ABNORMAL LOW (ref >60)
GFR, EST AFRICAN AMERICAN: 48 mL/min — AB (ref >60)
Glucose, Bld: 180 mg/dL — ABNORMAL HIGH (ref 65–99)
Potassium: 3.5 mmol/L (ref 3.5–5.1)
SODIUM: 132 mmol/L — AB (ref 135–145)

## 2017-03-31 LAB — COOXEMETRY PANEL
Carboxyhemoglobin: 1.3 % (ref 0.5–1.5)
Methemoglobin: 0.9 % (ref 0.0–1.5)
O2 SAT: 64.5 %
TOTAL HEMOGLOBIN: 12.1 g/dL (ref 12.0–16.0)

## 2017-03-31 MED ORDER — SPIRONOLACTONE 12.5 MG HALF TABLET
12.5000 mg | ORAL_TABLET | Freq: Every day | ORAL | Status: DC
  Administered 2017-03-31 – 2017-04-10 (×11): 12.5 mg via ORAL
  Filled 2017-03-31 (×11): qty 1

## 2017-03-31 MED ORDER — POTASSIUM CHLORIDE CRYS ER 20 MEQ PO TBCR
40.0000 meq | EXTENDED_RELEASE_TABLET | Freq: Two times a day (BID) | ORAL | Status: DC
  Filled 2017-03-31: qty 2

## 2017-03-31 MED ORDER — AMIODARONE LOAD VIA INFUSION
150.0000 mg | Freq: Once | INTRAVENOUS | Status: AC
  Administered 2017-03-31: 150 mg via INTRAVENOUS
  Filled 2017-03-31: qty 83.34

## 2017-03-31 MED ORDER — CALCIUM CARBONATE ANTACID 500 MG PO CHEW
1.0000 | CHEWABLE_TABLET | Freq: Three times a day (TID) | ORAL | Status: DC | PRN
  Administered 2017-03-31: 200 mg via ORAL
  Filled 2017-03-31: qty 1

## 2017-03-31 MED ORDER — DOXYCYCLINE HYCLATE 100 MG PO TABS
100.0000 mg | ORAL_TABLET | Freq: Two times a day (BID) | ORAL | Status: DC
  Administered 2017-03-31 – 2017-04-04 (×9): 100 mg via ORAL
  Filled 2017-03-31 (×8): qty 1

## 2017-03-31 MED ORDER — POTASSIUM CHLORIDE CRYS ER 20 MEQ PO TBCR
40.0000 meq | EXTENDED_RELEASE_TABLET | Freq: Two times a day (BID) | ORAL | Status: AC
  Administered 2017-03-31 – 2017-04-01 (×3): 40 meq via ORAL
  Filled 2017-03-31 (×2): qty 2

## 2017-03-31 MED ORDER — METOLAZONE 5 MG PO TABS
5.0000 mg | ORAL_TABLET | Freq: Two times a day (BID) | ORAL | Status: DC
  Administered 2017-03-31 – 2017-04-03 (×7): 5 mg via ORAL
  Filled 2017-03-31 (×7): qty 1

## 2017-03-31 NOTE — Progress Notes (Signed)
Advanced Heart Failure Rounding Note  Primary Cardiologist:  Dr. Gala Romney   Subjective:    Amio increased to 60/hr. Remains on milrinone 0.375. Remains in AF with RVR  CO-OX improved from 29>52%>65%. Remains on lasix 80 IV bid. Says he is peeing well but weight unchanged. Creatinine trending down from 2.5>2.0> 1.7   Says he is not feeling well. SOB. "Having difficulty with his mind."   CXR 03/26/17 concerning for acute bronchitis.   Objective:   Weight Range: 98.1 kg (216 lb 4.8 oz) Body mass index is 29.34 kg/m.   Vital Signs:   Temp:  [97.9 F (36.6 C)-98.6 F (37 C)] 98.1 F (36.7 C) (12/22 0700) Pulse Rate:  [56-120] 112 (12/22 0700) Resp:  [18-26] 21 (12/22 0700) BP: (97-124)/(76-98) 124/84 (12/22 0700) SpO2:  [93 %-98 %] 97 % (12/22 0700) Weight:  [98.1 kg (216 lb 4.8 oz)] 98.1 kg (216 lb 4.8 oz) (12/22 0413) Last BM Date: 03/31/17 Weight change: Filed Weights   03/30/17 0500 03/30/17 0559 03/31/17 0413  Weight: 98.3 kg (216 lb 12.8 oz) 98.3 kg (216 lb 12.8 oz) 98.1 kg (216 lb 4.8 oz)   Intake/Output:   Intake/Output Summary (Last 24 hours) at 03/31/2017 1108 Last data filed at 03/31/2017 1013 Gross per 24 hour  Intake 1921.82 ml  Output 2425 ml  Net -503.18 ml    Physical Exam   CVP 34 (checked personally 3 times) General:  Ill appearing. No resp difficulty HEENT: normal Neck: supple.JVP to ear. Carotids 2+ bilat; no bruits. No lymphadenopathy or thryomegaly appreciated. Cor: PMI laterally displaced. Irregular tachy. +s3 Lungs: + basilar crackles no wheeze Abdomen: soft, nontender, +distended. No hepatosplenomegaly. No bruits or masses. Good bowel sounds. Extremities: no cyanosis, clubbing, rash, 3+ edema  RUE PICC Neuro: alert & orientedx3, cranial nerves grossly intact. moves all 4 extremities w/o difficulty. Affect pleasant   Telemetry  A fib RVR 110-120s. Personally reviewed   EKG   No new tracings.   Labs    CBC Recent Labs   03/29/17 0613  WBC 17.9*  NEUTROABS 15.4*  HGB 12.7*  HCT 38.6*  MCV 91.7  PLT 186   Basic Metabolic Panel Recent Labs    80/16/55 0535 03/31/17 0439  NA 131* 132*  K 3.0* 3.5  CL 95* 94*  CO2 27 28  GLUCOSE 134* 180*  BUN 50* 45*  CREATININE 2.00* 1.66*  CALCIUM 8.0* 8.1*   Liver Function Tests No results for input(s): AST, ALT, ALKPHOS, BILITOT, PROT, ALBUMIN in the last 72 hours. No results for input(s): LIPASE, AMYLASE in the last 72 hours. Cardiac Enzymes No results for input(s): CKTOTAL, CKMB, CKMBINDEX, TROPONINI in the last 72 hours.  BNP: BNP (last 3 results) No results for input(s): BNP in the last 8760 hours.  ProBNP (last 3 results) No results for input(s): PROBNP in the last 8760 hours.   D-Dimer No results for input(s): DDIMER in the last 72 hours. Hemoglobin A1C No results for input(s): HGBA1C in the last 72 hours. Fasting Lipid Panel No results for input(s): CHOL, HDL, LDLCALC, TRIG, CHOLHDL, LDLDIRECT in the last 72 hours. Thyroid Function Tests No results for input(s): TSH, T4TOTAL, T3FREE, THYROIDAB in the last 72 hours.  Invalid input(s): FREET3  Other results:   Imaging    No results found.   Medications:     Scheduled Medications: . doxycycline  100 mg Oral Q12H  . furosemide  80 mg Intravenous TID  . loratadine  10 mg Oral Daily  .  mometasone-formoterol  2 puff Inhalation BID  . predniSONE  10 mg Oral Q breakfast  . sodium chloride flush  10-40 mL Intracatheter Q12H  . tiotropium  18 mcg Inhalation Daily    Infusions: . amiodarone 60 mg/hr (03/31/17 0706)  . milrinone 0.375 mcg/kg/min (03/31/17 1013)    PRN Medications: acetaminophen, albuterol, calcium carbonate, ondansetron (ZOFRAN) IV, sodium chloride flush, traZODone  Patient Profile   Fabiola BackerRichard Ehmke is a 66 y.o. male with h/o COPD admitted in 6/17 with acute systolic HF and AF with RVR in the setting of likely viral illness.   Assessment/Plan   1.  PAF  with RVR - Off  cardizem with low EF.  - Rate uncontrolled despite amio gtt. Continue drip at 60. Will give another 150 bolus - Refuses anticoagulant due to previous life-threatening RP bleed so not candidate for DC-CV - - CHA2DS2/VASc is at least 5. Pt has refused AC with h/o of RP bleed.  2. Acute on Chronic Systolic HF -> cardiogenic shock - NICM. R/LHC 08/2016 with minimal non-obstructive CAD and EF  - Echo 03/28/17 EF 20-25%, Trivial AI, Mild RAE, Pa peak pressure 35 mm Hg. Worsened from 05/2016 where EF was 45-50%.  - CVP > 30 (checked 3 times). Continue lasix 80 mg tid. Add metolazone 5 bid. Supp K - CO-OX improved on milrinone 0.375 mcg. Likely making it more difficult to control rate 3. h/o RP bleed - Have avoided heparin IV/SQ. (Pt also adamant to not receive this). On SCDs - Pt realizes there is a risk of stroke in using amiodarone with possibility of conversion to NSR, which is likely increased by his refusal of AC. He has verbalized understanding and agrees to proceed. Continues to refuse AC. No change.  - Creatinine trending down 2.5>2.   4. AECOPD - Continue nebs. - Refuses increased prednisone dose. Will continue 10mg  daily.Wheezing improved. - Refused augmentin. Continue doxycycline for now.  5. AKI - due to ATN/shock - improving with inotrope support 6.  Hypokalemia - Supp K. Discussed with PharmD 7. DVT Prophylaxis- ted hose + SCDs. .   Length of Stay: 5  Arvilla Meresaniel Tymeka Privette, MD  03/31/2017, 11:08 AM  Advanced Heart Failure Team Pager [phone] (M-F; 7a - 4p)  Please contact [facility] for night-coverage after hours (4p -7a ) and weekends on [email or web address]

## 2017-03-31 NOTE — Progress Notes (Addendum)
HR remaining at 120 or above for the past 30 minutes.  Patient asymptomatic, but his significant other Marylu Lund) is very concerned and is adamant that the MD be notified.  Therefore, the on-call MD was notified via text page.

## 2017-04-01 DIAGNOSIS — I5023 Acute on chronic systolic (congestive) heart failure: Secondary | ICD-10-CM

## 2017-04-01 LAB — BASIC METABOLIC PANEL
ANION GAP: 10 (ref 5–15)
BUN: 38 mg/dL — ABNORMAL HIGH (ref 6–20)
CHLORIDE: 88 mmol/L — AB (ref 101–111)
CO2: 33 mmol/L — AB (ref 22–32)
Calcium: 8.4 mg/dL — ABNORMAL LOW (ref 8.9–10.3)
Creatinine, Ser: 1.4 mg/dL — ABNORMAL HIGH (ref 0.61–1.24)
GFR calc Af Amer: 59 mL/min — ABNORMAL LOW (ref >60)
GFR, EST NON AFRICAN AMERICAN: 51 mL/min — AB (ref >60)
GLUCOSE: 145 mg/dL — AB (ref 65–99)
POTASSIUM: 2.7 mmol/L — AB (ref 3.5–5.1)
Sodium: 131 mmol/L — ABNORMAL LOW (ref 135–145)

## 2017-04-01 LAB — MAGNESIUM: Magnesium: 2.5 mg/dL — ABNORMAL HIGH (ref 1.7–2.4)

## 2017-04-01 LAB — COOXEMETRY PANEL
CARBOXYHEMOGLOBIN: 1.3 % (ref 0.5–1.5)
METHEMOGLOBIN: 1.2 % (ref 0.0–1.5)
O2 Saturation: 60.1 %
Total hemoglobin: 12.1 g/dL (ref 12.0–16.0)

## 2017-04-01 MED ORDER — POTASSIUM CHLORIDE CRYS ER 20 MEQ PO TBCR
40.0000 meq | EXTENDED_RELEASE_TABLET | Freq: Three times a day (TID) | ORAL | Status: DC
  Administered 2017-04-01 – 2017-04-08 (×22): 40 meq via ORAL
  Filled 2017-04-01 (×22): qty 2

## 2017-04-01 MED ORDER — POTASSIUM CHLORIDE CRYS ER 20 MEQ PO TBCR
40.0000 meq | EXTENDED_RELEASE_TABLET | Freq: Once | ORAL | Status: AC
  Administered 2017-04-01: 40 meq via ORAL
  Filled 2017-04-01: qty 2

## 2017-04-01 MED ORDER — ALPRAZOLAM 0.25 MG PO TABS
0.2500 mg | ORAL_TABLET | Freq: Three times a day (TID) | ORAL | Status: DC | PRN
  Administered 2017-04-01 – 2017-04-09 (×16): 0.25 mg via ORAL
  Filled 2017-04-01 (×17): qty 1

## 2017-04-01 MED ORDER — ALTEPLASE 2 MG IJ SOLR
2.0000 mg | Freq: Once | INTRAMUSCULAR | Status: AC
  Administered 2017-04-01: 2 mg

## 2017-04-01 NOTE — Plan of Care (Signed)
Pt's HR sustaining around 100-115 on amiodarone gtt. Milrinone infusing, last CVP documented at 15. Pt responding well to IV lasix, UOP plentiful, see flowsheet. Continuing current plan of care as ordered.

## 2017-04-01 NOTE — Progress Notes (Signed)
RN can't get a blood return from pt PICC line. Therefore, unable to draw labs this morning. Pt refused to get stuck by phlebotomist. Pt. Stated, "I paid money for this device so I can skip getting stuck by needles."   IV team consulted. Per IV team, RN will need an order to d/c CVP monitor. Devineni, MD made aware. RN received a call back @5 :30am and was advised to hold off on labs for now.    Will monitor.

## 2017-04-01 NOTE — Progress Notes (Addendum)
Advanced Heart Failure Rounding Note  Primary Cardiologist:  Dr. Gala Romney   Subjective:    Remains on Amio 60/hr and milrinone 0.375.  Still in AF. Rates 80-90 overnight but 110-120 this am   PICC line clotted this am and had to get t-PA to line. Now working again.   Says he is not feeling well. Refusing meds including lasix. Weight down 3 pounds overnight. No labs back yet. Feels anxious. Asking for Xanax    Objective:   Weight Range: 96.8 kg (213 lb 6.4 oz) Body mass index is 28.94 kg/m.   Vital Signs:   Temp:  [98 F (36.7 C)-98.3 F (36.8 C)] 98.3 F (36.8 C) (12/23 0735) Pulse Rate:  [105-118] 105 (12/23 0735) Resp:  [18-31] 18 (12/23 0735) BP: (101-118)/(77-85) 108/77 (12/23 0735) SpO2:  [98 %] 98 % (12/23 0735) Weight:  [96.8 kg (213 lb 6.4 oz)] 96.8 kg (213 lb 6.4 oz) (12/23 0612) Last BM Date: 03/31/17 Weight change: Filed Weights   03/30/17 0559 03/31/17 0413 04/01/17 0612  Weight: 98.3 kg (216 lb 12.8 oz) 98.1 kg (216 lb 4.8 oz) 96.8 kg (213 lb 6.4 oz)   Intake/Output:   Intake/Output Summary (Last 24 hours) at 04/01/2017 1116 Last data filed at 04/01/2017 0439 Gross per 24 hour  Intake 1502.65 ml  Output 3400 ml  Net -1897.35 ml    Physical Exam   CVP 16-17 General: Sitting up in bed. Anxious No resp difficulty HEENT: normal Neck: supple.JVP to jaw  Carotids 2+ bilat; no bruits. No lymphadenopathy or thryomegaly appreciated. Cor: PMI laterally displaced. Irreg tachy  +s3 Lungs: clear Abdomen: soft, nontender, + distended. No hepatosplenomegaly. No bruits or masses. Good bowel sounds. Extremities: no cyanosis, clubbing, rash, 2+ edema Neuro: alert & orientedx3, cranial nerves grossly intact. moves all 4 extremities w/o difficulty. Affect mildly agitated    Telemetry  A fib RVR 80-90s overnight now 100-120  Personally reviewed   EKG   No new tracings.   Labs    CBC No results for input(s): WBC, NEUTROABS, HGB, HCT, MCV, PLT in the  last 72 hours. Basic Metabolic Panel Recent Labs    60/45/40 0535 03/31/17 0439  NA 131* 132*  K 3.0* 3.5  CL 95* 94*  CO2 27 28  GLUCOSE 134* 180*  BUN 50* 45*  CREATININE 2.00* 1.66*  CALCIUM 8.0* 8.1*   Liver Function Tests No results for input(s): AST, ALT, ALKPHOS, BILITOT, PROT, ALBUMIN in the last 72 hours. No results for input(s): LIPASE, AMYLASE in the last 72 hours. Cardiac Enzymes No results for input(s): CKTOTAL, CKMB, CKMBINDEX, TROPONINI in the last 72 hours.  BNP: BNP (last 3 results) No results for input(s): BNP in the last 8760 hours.  ProBNP (last 3 results) No results for input(s): PROBNP in the last 8760 hours.   D-Dimer No results for input(s): DDIMER in the last 72 hours. Hemoglobin A1C No results for input(s): HGBA1C in the last 72 hours. Fasting Lipid Panel No results for input(s): CHOL, HDL, LDLCALC, TRIG, CHOLHDL, LDLDIRECT in the last 72 hours. Thyroid Function Tests No results for input(s): TSH, T4TOTAL, T3FREE, THYROIDAB in the last 72 hours.  Invalid input(s): FREET3  Other results:   Imaging    No results found.   Medications:     Scheduled Medications: . doxycycline  100 mg Oral Q12H  . furosemide  80 mg Intravenous TID  . loratadine  10 mg Oral Daily  . metolazone  5 mg Oral BID  .  mometasone-formoterol  2 puff Inhalation BID  . predniSONE  10 mg Oral Q breakfast  . sodium chloride flush  10-40 mL Intracatheter Q12H  . spironolactone  12.5 mg Oral Daily  . tiotropium  18 mcg Inhalation Daily    Infusions: . amiodarone 60 mg/hr (04/01/17 0317)  . milrinone 0.375 mcg/kg/min (04/01/17 0315)    PRN Medications: acetaminophen, albuterol, calcium carbonate, ondansetron (ZOFRAN) IV, sodium chloride flush, traZODone  Patient Profile   Reginald Hahn is a 66 y.o. male with h/o COPD admitted in 6/17 with acute systolic HF and AF with RVR in the setting of likely viral illness.   Assessment/Plan   1.  PAF with  RVR - Rate control improving on amio gtt but still elevated at times. Continue drip at 60.  - Refuses anticoagulant due to previous life-threatening RP bleed so not candidate for DC-CV - - CHA2DS2/VASc is at least 5. Pt has refused AC with h/o of RP bleed.  2. Acute on Chronic Systolic HF -> cardiogenic shock - NICM. R/LHC 08/2016 with minimal non-obstructive CAD and EF  - Echo 03/28/17 EF 20-25%, Trivial AI, Mild RAE, Pa peak pressure 35 mm Hg. Worsened from 05/2016 where EF was 45-50%.  - CVP 16-17 Continue lasix 80 mg tid and metolazone 5 bid. Await labs from this am  - CO-OX 60% on milrinone 0.375 mcg.  3. h/o RP bleed - Have avoided heparin IV/SQ. (Pt also adamant to not receive this). On SCDs - Pt realizes there is a risk of stroke in using amiodarone with possibility of conversion to NSR, which is likely increased by his refusal of AC. He has verbalized understanding and agrees to proceed. Continues to refuse AC. No change.  - Creatinine trending down 2.5>2.   4. AECOPD - Continue nebs. - Refuses increased prednisone dose. Will continue 10mg  daily.Wheezing improved. - Refused augmentin. Continue doxycycline for now.  5. AKI - due to ATN/shock - improving with inotrope support. Awaiting labs today 6.  Hypokalemia - Supp K. Discussed with PharmD 7. DVT Prophylaxis- ted hose + SCDs. Marland Kitchen 8. Anxiety - start Xanax 0.25 tid prn  Length of Stay: 6  Arvilla Meres, MD  04/01/2017, 11:16 AM  Advanced Heart Failure Team Pager [phone] (M-F; 7a - 4p)  Please contact [facility] for night-coverage after hours (4p -7a ) and weekends on [email or web address]

## 2017-04-02 LAB — BASIC METABOLIC PANEL
ANION GAP: 9 (ref 5–15)
BUN: 40 mg/dL — AB (ref 6–20)
CHLORIDE: 85 mmol/L — AB (ref 101–111)
CO2: 40 mmol/L — ABNORMAL HIGH (ref 22–32)
Calcium: 8.8 mg/dL — ABNORMAL LOW (ref 8.9–10.3)
Creatinine, Ser: 1.46 mg/dL — ABNORMAL HIGH (ref 0.61–1.24)
GFR calc Af Amer: 56 mL/min — ABNORMAL LOW (ref >60)
GFR, EST NON AFRICAN AMERICAN: 49 mL/min — AB (ref >60)
GLUCOSE: 135 mg/dL — AB (ref 65–99)
POTASSIUM: 2.6 mmol/L — AB (ref 3.5–5.1)
Sodium: 134 mmol/L — ABNORMAL LOW (ref 135–145)

## 2017-04-02 LAB — COOXEMETRY PANEL
CARBOXYHEMOGLOBIN: 1.3 % (ref 0.5–1.5)
METHEMOGLOBIN: 1 % (ref 0.0–1.5)
O2 Saturation: 62.8 %
TOTAL HEMOGLOBIN: 12.1 g/dL (ref 12.0–16.0)

## 2017-04-02 LAB — MAGNESIUM: MAGNESIUM: 2.2 mg/dL (ref 1.7–2.4)

## 2017-04-02 MED ORDER — POTASSIUM CHLORIDE CRYS ER 20 MEQ PO TBCR
40.0000 meq | EXTENDED_RELEASE_TABLET | Freq: Once | ORAL | Status: AC
  Administered 2017-04-02: 40 meq via ORAL
  Filled 2017-04-02: qty 2

## 2017-04-02 MED ORDER — POTASSIUM CHLORIDE CRYS ER 20 MEQ PO TBCR
40.0000 meq | EXTENDED_RELEASE_TABLET | ORAL | Status: AC
  Administered 2017-04-02: 40 meq via ORAL
  Filled 2017-04-02: qty 2

## 2017-04-02 NOTE — Care Management Important Message (Signed)
Important Message  Patient Details  Name: Reginald Hahn MRN: [id number] Date of Birth: 01-03-1951   Medicare Important Message Given:  Yes    Kyla Balzarine 04/02/2017, 9:56 AM

## 2017-04-02 NOTE — Progress Notes (Signed)
Advanced Heart Failure Rounding Note  Primary Cardiologist:  Dr. Gala Romney   Subjective:    Remains on Amio 60/hr and milrinone 0.375. CO-OX 63%. Yesterday diuresed with IV lasix + metolazone. Brisk diuresis noted. Weight down another 9 pounds.   Doesn't like the food. Denies SOB. Having some shoulder blade discomfort but says he doesn't need pain medication.    Objective:   Weight Range: 204 lb 11.2 oz (92.9 kg) Body mass index is 27.76 kg/m.   Vital Signs:   Temp:  [97.9 F (36.6 C)-98 F (36.7 C)] 98 F (36.7 C) (12/24 0431) Pulse Rate:  [58-106] 100 (12/24 0431) Resp:  [18-21] 21 (12/24 0431) BP: (102-108)/(78-85) 102/83 (12/24 0431) SpO2:  [95 %-96 %] 95 % (12/24 0431) Weight:  [204 lb 11.2 oz (92.9 kg)] 204 lb 11.2 oz (92.9 kg) (12/24 0431) Last BM Date: 03/31/17 Weight change: Filed Weights   03/31/17 0413 04/01/17 0612 04/02/17 0431  Weight: 216 lb 4.8 oz (98.1 kg) 213 lb 6.4 oz (96.8 kg) 204 lb 11.2 oz (92.9 kg)   Intake/Output:   Intake/Output Summary (Last 24 hours) at 04/02/2017 0955 Last data filed at 04/02/2017 5945 Gross per 24 hour  Intake 1785.57 ml  Output 5575 ml  Net -3789.43 ml    Physical Exam   CVP 18 personally checked.  General:  In bed. No resp difficulty HEENT: normal Neck: supple. To jaw  Carotids 2+ bilat; no bruits. No lymphadenopathy or thryomegaly appreciated. Cor: PMI laterally displaced. Irregular  rate & rhythm. No rubs or murmurs. + S3  Lungs: clear with decreased BS no wheezing Abdomen: soft, nontender, nondistended. No hepatosplenomegaly. No bruits or masses. Good bowel sounds. Extremities: no cyanosis, clubbing, rash, R and LLE SCDs 2+ edema. RUE PICC  Neuro: alert & orientedx3, cranial nerves grossly intact. moves all 4 extremities w/o difficulty. Affect pleasant   Telemetry   A fib 90s personally reviewed.   EKG   No new tracings.   Labs    CBC No results for input(s): WBC, NEUTROABS, HGB, HCT, MCV, PLT  in the last 72 hours. Basic Metabolic Panel Recent Labs    85/92/92 1100 04/02/17 0523  NA 131* 134*  K 2.7* 2.6*  CL 88* 85*  CO2 33* 40*  GLUCOSE 145* 135*  BUN 38* 40*  CREATININE 1.40* 1.46*  CALCIUM 8.4* 8.8*  MG 2.5* 2.2   Liver Function Tests No results for input(s): AST, ALT, ALKPHOS, BILITOT, PROT, ALBUMIN in the last 72 hours. No results for input(s): LIPASE, AMYLASE in the last 72 hours. Cardiac Enzymes No results for input(s): CKTOTAL, CKMB, CKMBINDEX, TROPONINI in the last 72 hours.  BNP: BNP (last 3 results) No results for input(s): BNP in the last 8760 hours.  ProBNP (last 3 results) No results for input(s): PROBNP in the last 8760 hours.   D-Dimer No results for input(s): DDIMER in the last 72 hours. Hemoglobin A1C No results for input(s): HGBA1C in the last 72 hours. Fasting Lipid Panel No results for input(s): CHOL, HDL, LDLCALC, TRIG, CHOLHDL, LDLDIRECT in the last 72 hours. Thyroid Function Tests No results for input(s): TSH, T4TOTAL, T3FREE, THYROIDAB in the last 72 hours.  Invalid input(s): FREET3  Other results:   Imaging    No results found.   Medications:     Scheduled Medications: . doxycycline  100 mg Oral Q12H  . furosemide  80 mg Intravenous TID  . loratadine  10 mg Oral Daily  . metolazone  5 mg Oral  BID  . mometasone-formoterol  2 puff Inhalation BID  . potassium chloride  40 mEq Oral TID  . predniSONE  10 mg Oral Q breakfast  . sodium chloride flush  10-40 mL Intracatheter Q12H  . spironolactone  12.5 mg Oral Daily  . tiotropium  18 mcg Inhalation Daily    Infusions: . amiodarone 60 mg/hr (04/02/17 0824)  . milrinone 0.375 mcg/kg/min (04/02/17 0300)    PRN Medications: acetaminophen, albuterol, ALPRAZolam, calcium carbonate, ondansetron (ZOFRAN) IV, sodium chloride flush, traZODone  Patient Profile   Reginald Hahn is a 66 y.o. male with h/o COPD admitted in 6/17 with acute systolic HF and AF with RVR in the  setting of likely viral illness.   Assessment/Plan   1.  PAF with RVR - Rate control improving on amio gtt but still elevated at times. Continue drip at 60.  - Refuses anticoagulant due to previous life-threatening RP bleed so not candidate for DC-CV - - CHA2DS2/VASc is at least 5. Pt has refused AC with h/o of RP bleed.  2. Acute on Chronic Systolic HF -> cardiogenic shock - NICM. R/LHC 08/2016 with minimal non-obstructive CAD and EF  - Echo 03/28/17 EF 20-25%, Trivial AI, Mild RAE, Pa peak pressure 35 mm Hg. Worsened from 05/2016 where EF was 45-50%.  CVP 18.  Continue IV lasix + metolazone. CO-OX stable 63% Continue milrinone 0.375 mcg.  3. h/o RP bleed - Have avoided heparin IV/SQ. (Pt also adamant to not receive this). On SCDs - Pt realizes there is a risk of stroke in using amiodarone with possibility of conversion to NSR, which is likely increased by his refusal of AC. He has verbalized understanding and agrees to proceed. Continues to refuse AC. No change.  - Creatinine trending down 2.5>2>1.4 .   4. AECOPD - Continue nebs. - Refuses increased prednisone dose. Will continue 10mg  daily.Wheezing improved. - Refused augmentin. Continue doxycycline for now.  5. AKI - due to ATN/shock -Creatinine stable at 1.46  6.  Hypokalemia -  Discussed with PharmD Givnen 40 meq K + 40 meq three times a day.  7. DVT Prophylaxis- ted hose + SCDs. Marland Kitchen 8. Anxiety - start Xanax 0.25 tid prn  Length of Stay: 7  Amy Clegg, NP  04/02/2017, 9:55 AM  Advanced Heart Failure Team Pager [phone] (M-F; 7a - 4p)  Please contact [facility] for night-coverage after hours (4p -7a ) and weekends on [email or web address]  Patient seen and examined with Tonye Becket, NP. We discussed all aspects of the encounter. I agree with the assessment and plan as stated above.   He remains very tenuous. Still in AF but rate has slowed on amio. Continues to refuse AC. Diuresing well on high-dose lasix and milrinone. But CVP  still very high. Will continue diuresis. Respiratory status improving with diuresis and treatment of COPD flare. Will stop nebs at this point. Renal function stable.   Arvilla Meres, MD  3:42 PM

## 2017-04-02 NOTE — Progress Notes (Signed)
Order received from Nada Boozer, NP for 40 mEq of PO potassium for critical lab value of K-2.6. Will continue to monitor.  Viviano Simas, RN

## 2017-04-02 NOTE — Progress Notes (Signed)
CRITICAL VALUE ALERT  Critical Value: K 2.6  Date & Time Notied:  04/02/2017 2426  Provider Notified: Nada Boozer, NP  Orders Received/Actions taken: awaiting further orders

## 2017-04-03 DIAGNOSIS — J441 Chronic obstructive pulmonary disease with (acute) exacerbation: Secondary | ICD-10-CM

## 2017-04-03 LAB — BASIC METABOLIC PANEL
ANION GAP: 14 (ref 5–15)
Anion gap: 11 (ref 5–15)
BUN: 35 mg/dL — ABNORMAL HIGH (ref 6–20)
BUN: 31 mg/dL — ABNORMAL HIGH (ref 6–20)
CALCIUM: 9.3 mg/dL (ref 8.9–10.3)
CHLORIDE: 76 mmol/L — AB (ref 101–111)
CO2: 42 mmol/L — AB (ref 22–32)
CO2: 38 mmol/L — AB (ref 22–32)
CREATININE: 1.53 mg/dL — AB (ref 0.61–1.24)
Calcium: 8.9 mg/dL (ref 8.9–10.3)
Chloride: 80 mmol/L — ABNORMAL LOW (ref 101–111)
Creatinine, Ser: 1.46 mg/dL — ABNORMAL HIGH (ref 0.61–1.24)
GFR calc non Af Amer: 49 mL/min — ABNORMAL LOW (ref >60)
GFR, EST AFRICAN AMERICAN: 53 mL/min — AB (ref >60)
GFR, EST AFRICAN AMERICAN: 56 mL/min — AB (ref >60)
GFR, EST NON AFRICAN AMERICAN: 46 mL/min — AB (ref >60)
GLUCOSE: 168 mg/dL — AB (ref 65–99)
GLUCOSE: 364 mg/dL — AB (ref 65–99)
POTASSIUM: 3.3 mmol/L — AB (ref 3.5–5.1)
Potassium: 2.5 mmol/L — CL (ref 3.5–5.1)
Sodium: 133 mmol/L — ABNORMAL LOW (ref 135–145)
Sodium: 128 mmol/L — ABNORMAL LOW (ref 135–145)

## 2017-04-03 LAB — COOXEMETRY PANEL
Carboxyhemoglobin: 1.8 % — ABNORMAL HIGH (ref 0.5–1.5)
Methemoglobin: 0.8 % (ref 0.0–1.5)
O2 Saturation: 67.5 %
Total hemoglobin: 13.1 g/dL (ref 12.0–16.0)

## 2017-04-03 MED ORDER — APIXABAN 5 MG PO TABS
5.0000 mg | ORAL_TABLET | Freq: Two times a day (BID) | ORAL | Status: DC
  Administered 2017-04-03 – 2017-04-10 (×14): 5 mg via ORAL
  Filled 2017-04-03 (×15): qty 1

## 2017-04-03 MED ORDER — SPIRONOLACTONE 12.5 MG HALF TABLET: 12.5000 mg | ORAL_TABLET | Freq: Every day | ORAL | Status: DC

## 2017-04-03 MED ORDER — POTASSIUM CHLORIDE CRYS ER 20 MEQ PO TBCR
40.0000 meq | EXTENDED_RELEASE_TABLET | ORAL | Status: AC
  Administered 2017-04-03 (×3): 40 meq via ORAL
  Filled 2017-04-03 (×3): qty 2

## 2017-04-03 MED ORDER — METOLAZONE 5 MG PO TABS
2.5000 mg | ORAL_TABLET | Freq: Two times a day (BID) | ORAL | Status: DC
  Administered 2017-04-03 – 2017-04-04 (×2): 2.5 mg via ORAL
  Filled 2017-04-03 (×2): qty 1

## 2017-04-03 MED ORDER — MILRINONE LACTATE IN DEXTROSE 20-5 MG/100ML-% IV SOLN
0.2500 ug/kg/min | INTRAVENOUS | Status: DC
  Administered 2017-04-03 – 2017-04-06 (×5): 0.25 ug/kg/min via INTRAVENOUS
  Filled 2017-04-03 (×5): qty 100

## 2017-04-03 NOTE — Progress Notes (Signed)
CRITICAL VALUE ALERT  Critical Value:  Potassium 2.5  Date & Time Notied:  04/03/2017 0520  Provider Notified: Dr. Gala Romney, 04/03/2017 0527   Orders Received/Actions taken: Potassium PO every hour times 3 doses.

## 2017-04-03 NOTE — Progress Notes (Addendum)
Advanced Heart Failure Rounding Note  Primary Cardiologist:  Dr. Gala Romney   Subjective:    Remains on Amio 60/hr and milrinone 0.375. CO-OX 68%  Continues to diurese briskly with IV lasix and metolazone 5 bid. Weight down another 2 pounds (total 14). CVP 17-> 12 today.   Breathing better. No CP. Remains in AF with rates 90-105.   Objective:   Weight Range: 91.8 kg (202 lb 6.4 oz) Body mass index is 27.45 kg/m.   Vital Signs:   Temp:  [97.8 F (36.6 C)-98.1 F (36.7 C)] 98.1 F (36.7 C) (12/25 0500) Pulse Rate:  [66-119] 70 (12/25 0500) Resp:  [19-29] 24 (12/24 2012) BP: (96-112)/(69-84) 105/69 (12/25 0500) SpO2:  [93 %-96 %] 93 % (12/25 0500) Weight:  [91.8 kg (202 lb 6.4 oz)] 91.8 kg (202 lb 6.4 oz) (12/25 0500) Last BM Date: 04/02/17 Weight change: Filed Weights   04/01/17 0612 04/02/17 0431 04/03/17 0500  Weight: 96.8 kg (213 lb 6.4 oz) 92.9 kg (204 lb 11.2 oz) 91.8 kg (202 lb 6.4 oz)   Intake/Output:   Intake/Output Summary (Last 24 hours) at 04/03/2017 0800 Last data filed at 04/03/2017 9163 Gross per 24 hour  Intake 2209.76 ml  Output 6315 ml  Net -4105.24 ml    Physical Exam   General:  Sitting up in bed. No resp difficulty HEENT: normal Neck: supple.JVP to ear  Carotids 2+ bilat; no bruits. No lymphadenopathy or thryomegaly appreciated. Cor: PMI laterally displaced. Irregular. Tachy  +s3 Lungs: minimal crackles at bases Abdomen: soft, nontender, nondistended. No hepatosplenomegaly. No bruits or masses. Good bowel sounds. Extremities: no cyanosis, clubbing, rash, tr edema +SCDS Neuro: alert & orientedx3, cranial nerves grossly intact. moves all 4 extremities w/o difficulty. Affect pleasant   Telemetry   A fib 90-105s personally reviewed.   EKG   No new tracings.   Labs    CBC No results for input(s): WBC, NEUTROABS, HGB, HCT, MCV, PLT in the last 72 hours. Basic Metabolic Panel Recent Labs    84/66/59 1100 04/02/17 0523  04/03/17 0436  NA 131* 134* 133*  K 2.7* 2.6* 2.5*  CL 88* 85* 80*  CO2 33* 40* 42*  GLUCOSE 145* 135* 168*  BUN 38* 40* 35*  CREATININE 1.40* 1.46* 1.53*  CALCIUM 8.4* 8.8* 9.3  MG 2.5* 2.2  --    Liver Function Tests No results for input(s): AST, ALT, ALKPHOS, BILITOT, PROT, ALBUMIN in the last 72 hours. No results for input(s): LIPASE, AMYLASE in the last 72 hours. Cardiac Enzymes No results for input(s): CKTOTAL, CKMB, CKMBINDEX, TROPONINI in the last 72 hours.  BNP: BNP (last 3 results) No results for input(s): BNP in the last 8760 hours.  ProBNP (last 3 results) No results for input(s): PROBNP in the last 8760 hours.   D-Dimer No results for input(s): DDIMER in the last 72 hours. Hemoglobin A1C No results for input(s): HGBA1C in the last 72 hours. Fasting Lipid Panel No results for input(s): CHOL, HDL, LDLCALC, TRIG, CHOLHDL, LDLDIRECT in the last 72 hours. Thyroid Function Tests No results for input(s): TSH, T4TOTAL, T3FREE, THYROIDAB in the last 72 hours.  Invalid input(s): FREET3  Other results:   Imaging    No results found.   Medications:     Scheduled Medications: . doxycycline  100 mg Oral Q12H  . furosemide  80 mg Intravenous TID  . loratadine  10 mg Oral Daily  . metolazone  5 mg Oral BID  . mometasone-formoterol  2 puff  Inhalation BID  . potassium chloride  40 mEq Oral TID  . potassium chloride  40 mEq Oral Q1 Hr x 3  . predniSONE  10 mg Oral Q breakfast  . sodium chloride flush  10-40 mL Intracatheter Q12H  . spironolactone  12.5 mg Oral Daily  . tiotropium  18 mcg Inhalation Daily    Infusions: . amiodarone 60 mg/hr (04/03/17 0249)  . milrinone 0.375 mcg/kg/min (04/03/17 0700)    PRN Medications: acetaminophen, albuterol, ALPRAZolam, calcium carbonate, ondansetron (ZOFRAN) IV, sodium chloride flush, traZODone  Patient Profile   Fabiola BackerRichard Rickey is a 66 y.o. male with h/o COPD admitted in 6/17 with acute systolic HF and AF with  RVR in the setting of likely viral illness.   Assessment/Plan   1.  PAF with RVR - Rate control improving on amio gtt but still elevated at times. Continue drip at 60.  - Has refused heparin due to previous life-threatening RP bleed however needs DC-CV due to AF tachy cardiomyopathy. Long discussion about possibly using Eliquis today and he is willing to consider. Would like me to talk to his family later today which I will do. If we start Eliquis will need 3+ doses prior to TEE and DC-CV - - CHA2DS2/VASc is at least 5.  2. Acute on Chronic Systolic HF -> cardiogenic shock - NICM. R/LHC 08/2016 with minimal non-obstructive CAD and EF  - Echo 03/28/17 EF 20-25%, Trivial AI, Mild RAE, Pa peak pressure 35 mm Hg. Worsened from 05/2016 where EF was 45-50%.  - Improving with milrinone and IV lasix and metolazone. Co-ox 68% - Will drop milrinone back to 0.25 - CVP 12. Continue IV diuresis one more day. Back metolazone down to 2.5 bid - Supp K  3. h/o RP bleed - Have avoided heparin IV/SQ. (Pt also adamant to not receive this). On SCDs. See above discussion about Eliquis  - Pt realizes there is a risk of stroke in using amiodarone with possibility of conversion to NSR, which is likely increased by his refusal of AC. He has verbalized understanding and agrees to proceed. Continues to refuse AC. No change.  4. AECOPD - Much improved. Can stop nebs.  - Refuses increased prednisone dose. Will continue 10mg  daily (home dose)  - Refused augmentin. Continue doxycycline for now.  5. AKI - due to ATN/shock -Creatinine stable at 1.5 6.  Hypokalemia -  Will supp. Add spiro 12.5  7. DVT Prophylaxis- ted hose + SCDs. Marland Kitchen. 8. Anxiety - continue Xanax 0.25 tid prn  Length of Stay: 8  Arvilla Meresaniel Arlinda Barcelona, MD  04/03/2017, 8:00 AM  Advanced Heart Failure Team Pager [phone] (M-F; 7a - 4p)  Please contact [facility] for night-coverage after hours (4p -7a ) and weekends on  [email or web address]   Addendum:  Discussed risks/indications on anticoagulation with patient, his ex-wife and his daughter. He is ok with starting apixaban in anticipation of possible TEE/DC-CV on Thursday.   Arvilla Meresaniel Atreus Hasz, MD  2:07 PM

## 2017-04-04 LAB — COOXEMETRY PANEL
CARBOXYHEMOGLOBIN: 1.5 % (ref 0.5–1.5)
Carboxyhemoglobin: 1.2 % (ref 0.5–1.5)
METHEMOGLOBIN: 0.9 % (ref 0.0–1.5)
Methemoglobin: 1.2 % (ref 0.0–1.5)
O2 SAT: 44.7 %
O2 SAT: 54.3 %
TOTAL HEMOGLOBIN: 13.7 g/dL (ref 12.0–16.0)
Total hemoglobin: 14.3 g/dL (ref 12.0–16.0)

## 2017-04-04 LAB — BASIC METABOLIC PANEL
Anion gap: 11 (ref 5–15)
BUN: 32 mg/dL — ABNORMAL HIGH (ref 6–20)
CALCIUM: 9.6 mg/dL (ref 8.9–10.3)
CO2: 43 mmol/L — AB (ref 22–32)
CREATININE: 1.44 mg/dL — AB (ref 0.61–1.24)
Chloride: 81 mmol/L — ABNORMAL LOW (ref 101–111)
GFR, EST AFRICAN AMERICAN: 57 mL/min — AB (ref >60)
GFR, EST NON AFRICAN AMERICAN: 50 mL/min — AB (ref >60)
Glucose, Bld: 89 mg/dL (ref 65–99)
Potassium: 3.5 mmol/L (ref 3.5–5.1)
SODIUM: 135 mmol/L (ref 135–145)

## 2017-04-04 MED ORDER — FUROSEMIDE 20 MG PO TABS
60.0000 mg | ORAL_TABLET | Freq: Every day | ORAL | Status: DC
  Administered 2017-04-04: 60 mg via ORAL
  Filled 2017-04-04: qty 1

## 2017-04-04 NOTE — Progress Notes (Addendum)
Benefit check sent for eliquis, patient received 30 day free card, verbalized understanding.   Jacqualin Combes, RN        # 3. S/W [facility] @ [employer] # [phone]    ELIQUIS 50 MG BID   COVER- YES  CO-PAY- $ 146.21  TIER- 3 DRUG  PRIOR APPROVAL- NO  DEDUCTIBLE - NOT MET   PREFERRED PHARMACY : [facility], [facility], [facility] [place]

## 2017-04-04 NOTE — Progress Notes (Signed)
Advanced Heart Failure Rounding Note  Primary Cardiologist:  Dr. Gala Romney   Subjective:    Remains on Amio 60/hr and milrinone 0.375. CO-OX 44.7% this am. Repeat pending.   Continues to diurese briskly with IV lasix and metolazone 2.5 bid.   Feeling better. OK with blood thinner. Denies pain or bleeding. Breathing much improved. K low and being supped.   Negative 3.5 L and down 7 lbs. Creatinine stable at 1.4.  Breathing better. No CP. Remains in AF with rates 90-105.  Objective:   Weight Range: 195 lb 14.4 oz (88.9 kg) Body mass index is 26.57 kg/m.   Vital Signs:   Temp:  [97.5 F (36.4 C)-98.3 F (36.8 C)] 97.7 F (36.5 C) (12/26 0458) Pulse Rate:  [101-128] 128 (12/26 0458) Resp:  [19-25] 19 (12/26 0458) BP: (88-113)/(58-79) 101/69 (12/26 0458) SpO2:  [92 %-99 %] 94 % (12/26 0458) Weight:  [195 lb 14.4 oz (88.9 kg)] 195 lb 14.4 oz (88.9 kg) (12/26 0458) Last BM Date: 04/03/17 Weight change: Filed Weights   04/02/17 0431 04/03/17 0500 04/04/17 0458  Weight: 204 lb 11.2 oz (92.9 kg) 202 lb 6.4 oz (91.8 kg) 195 lb 14.4 oz (88.9 kg)   Intake/Output:   Intake/Output Summary (Last 24 hours) at 04/04/2017 0844 Last data filed at 04/04/2017 9826 Gross per 24 hour  Intake 2482.87 ml  Output 5202 ml  Net -2719.13 ml    Physical Exam   General: Sitting up in bed. NAD HEENT: Normal Neck: Supple. JVP 7-8 cm. Carotids 2+ bilat; no bruits. No thyromegaly or nodule noted. Cor: PMI lateral. Irregular. Tachy. +S3. Lungs: MIinimal crackles at bases. No wheezing Abdomen: Soft, non-tender, non-distended, no HSM. No bruits or masses. +BS  Extremities: No cyanosis, clubbing, or rash. Tr edema. + SCDs.  Neuro: Alert & orientedx3, cranial nerves grossly intact. moves all 4 extremities w/o difficulty. Affect pleasant   Telemetry   A fib 100-120s, personally reviewed.   EKG   No new tracings.   Labs    CBC No results for input(s): WBC, NEUTROABS, HGB, HCT, MCV,  PLT in the last 72 hours. Basic Metabolic Panel Recent Labs    41/58/30 1100 04/02/17 0523  04/03/17 1455 04/04/17 0429  NA 131* 134*   < > 128* 135  K 2.7* 2.6*   < > 3.3* 3.5  CL 88* 85*   < > 76* 81*  CO2 33* 40*   < > 38* 43*  GLUCOSE 145* 135*   < > 364* 89  BUN 38* 40*   < > 31* 32*  CREATININE 1.40* 1.46*   < > 1.46* 1.44*  CALCIUM 8.4* 8.8*   < > 8.9 9.6  MG 2.5* 2.2  --   --   --    < > = values in this interval not displayed.   Liver Function Tests No results for input(s): AST, ALT, ALKPHOS, BILITOT, PROT, ALBUMIN in the last 72 hours. No results for input(s): LIPASE, AMYLASE in the last 72 hours. Cardiac Enzymes No results for input(s): CKTOTAL, CKMB, CKMBINDEX, TROPONINI in the last 72 hours.  BNP: BNP (last 3 results) No results for input(s): BNP in the last 8760 hours.  ProBNP (last 3 results) No results for input(s): PROBNP in the last 8760 hours.   D-Dimer No results for input(s): DDIMER in the last 72 hours. Hemoglobin A1C No results for input(s): HGBA1C in the last 72 hours. Fasting Lipid Panel No results for input(s): CHOL, HDL, LDLCALC, TRIG, CHOLHDL,  LDLDIRECT in the last 72 hours. Thyroid Function Tests No results for input(s): TSH, T4TOTAL, T3FREE, THYROIDAB in the last 72 hours.  Invalid input(s): FREET3  Other results:   Imaging   No results found.  Medications:    Scheduled Medications: . apixaban  5 mg Oral BID  . doxycycline  100 mg Oral Q12H  . furosemide  80 mg Intravenous TID  . loratadine  10 mg Oral Daily  . metolazone  2.5 mg Oral BID  . mometasone-formoterol  2 puff Inhalation BID  . potassium chloride  40 mEq Oral TID  . predniSONE  10 mg Oral Q breakfast  . sodium chloride flush  10-40 mL Intracatheter Q12H  . spironolactone  12.5 mg Oral Daily  . tiotropium  18 mcg Inhalation Daily    Infusions: . amiodarone 60 mg/hr (04/04/17 0717)  . milrinone 0.25 mcg/kg/min (04/04/17 0831)    PRN  Medications: acetaminophen, albuterol, ALPRAZolam, calcium carbonate, ondansetron (ZOFRAN) IV, sodium chloride flush, traZODone  Patient Profile   Reginald Hahn is a 66 y.o. male with h/o COPD admitted in 6/17 with acute systolic HF and AF with RVR in the setting of likely viral illness.   Assessment/Plan   1.  PAF with RVR - Rate control improving on amio gtt but still elevated at times. Continue drip at 60.  - Has refused heparin due to previous life-threatening RP bleed however needs DC-CV due to AF tachy cardiomyopathy. Long discussion about possibly using Eliquis today and he is willing to consider. Would like me to talk to his family later today which Dr. Gala Romney has done. If we start Eliquis will need 3+ doses prior to TEE and DC-CV.  - CHA2DS2/VASc is at least 5.  2. Acute on Chronic Systolic HF -> cardiogenic shock - NICM. R/LHC 08/2016 with minimal non-obstructive CAD and EF  - Echo 03/28/17 EF 20-25%, Trivial AI, Mild RAE, Pa peak pressure 35 mm Hg. Worsened from 05/2016 where EF was 45-50%.  - Improving with milrinone and IV lasix and metolazone. Co-ox 44.7% this am. Repeat pending.  - Will drop milrinone back to 0.25 - CVP 7-8 cm. Will cut back to po lasix. Was taking as needed at home. Will follow on lasix po 60 mg daily.  - Supp K  3. h/o RP bleed - Have avoided heparin IV/SQ. (Pt also adamant to not receive this). On SCDs. See above discussion about Eliquis. No change.  - Pt realizes there is a risk of stroke in using amiodarone with possibility of conversion to NSR, which is likely increased by his refusal of AC. He has verbalized understanding and agrees to proceed. Continues to refuse AC. No change.  4. AECOPD - Much improved. Can stop nebs.  - Refuses increased prednisone dose. Will continue 10mg  daily (home dose)  - Refused augmentin. Continue doxycycline for now.  5. AKI - Due to ATN/shock - Creatinine stable at 1.4.  6.  Hypokalemia -  Continue spiro 12.5 mg  daily.  7. DVT Prophylaxis - Ted hose + SCDs.  Now on Eliquis.  8. Anxiety - continue Xanax 0.25 tid prn  Now on Eliquis. Plan for DCCV later this week.   Length of Stay: [address]  Graciella Freer, [place]  04/04/2017, 8:44 AM  Advanced Heart Failure Team Pager [phone] (M-F; 7a - 4p)  Please contact [facility] for night-coverage after hours (4p -7a ) and weekends on [email or web address]  Patient seen and examined with the above-signed Advanced Practice Provider and/or Housestaff. I  personally reviewed laboratory data, imaging studies and relevant notes. I independently examined the patient and formulated the important aspects of the plan. I have edited the note to reflect any of my changes or salient points. I have personally discussed the plan with the patient and/or family.  Volume status improving but co-ox remains quite tenuous despite milrinone support. CVP down to 7. Remains in AF with RVR on amio gtt. Eliquis started yesterday and tolerating without bleeding. AF rate too fast to permit LV recovery so will need restoration of NSR. Will plan TEE and DC-CV tomorrow. Agree with cutting back diuretics. Continue to supp K.  Arvilla Meresaniel Bensimhon, MD  4:52 PM

## 2017-04-05 ENCOUNTER — Inpatient Hospital Stay (HOSPITAL_COMMUNITY): Payer: Medicare Other | Admitting: Anesthesiology

## 2017-04-05 ENCOUNTER — Encounter (HOSPITAL_COMMUNITY): Payer: Self-pay | Admitting: *Deleted

## 2017-04-05 ENCOUNTER — Encounter (HOSPITAL_COMMUNITY)
Admission: EM | Disposition: A | Discharge status: Home Health | Payer: Self-pay | Source: Home / Self Care | Attending: Internal Medicine

## 2017-04-05 ENCOUNTER — Inpatient Hospital Stay (HOSPITAL_COMMUNITY): Payer: Medicare Other

## 2017-04-05 DIAGNOSIS — I517 Cardiomegaly: Secondary | ICD-10-CM

## 2017-04-05 DIAGNOSIS — I4891 Unspecified atrial fibrillation: Secondary | ICD-10-CM

## 2017-04-05 HISTORY — PX: CARDIOVERSION: SHX1299

## 2017-04-05 HISTORY — PX: TEE WITHOUT CARDIOVERSION: SHX5443

## 2017-04-05 LAB — BASIC METABOLIC PANEL
Anion gap: 7 (ref 5–15)
BUN: 26 mg/dL — AB (ref 6–20)
CALCIUM: 9.1 mg/dL (ref 8.9–10.3)
CHLORIDE: 85 mmol/L — AB (ref 101–111)
CO2: 40 mmol/L — ABNORMAL HIGH (ref 22–32)
CREATININE: 1.24 mg/dL (ref 0.61–1.24)
GFR calc Af Amer: >60 mL/min (ref >60)
GFR calc non Af Amer: 59 mL/min — ABNORMAL LOW (ref >60)
Glucose, Bld: 87 mg/dL (ref 65–99)
Potassium: 3.3 mmol/L — ABNORMAL LOW (ref 3.5–5.1)
SODIUM: 132 mmol/L — AB (ref 135–145)

## 2017-04-05 LAB — COOXEMETRY PANEL
CARBOXYHEMOGLOBIN: 1.1 % (ref 0.5–1.5)
Methemoglobin: 1 % (ref 0.0–1.5)
O2 SAT: 61.4 %
TOTAL HEMOGLOBIN: 14.2 g/dL (ref 12.0–16.0)

## 2017-04-05 SURGERY — ECHOCARDIOGRAM, TRANSESOPHAGEAL
Anesthesia: Monitor Anesthesia Care

## 2017-04-05 MED ORDER — KETAMINE HCL-SODIUM CHLORIDE 100-0.9 MG/10ML-% IV SOSY
PREFILLED_SYRINGE | INTRAVENOUS | Status: AC
  Filled 2017-04-05: qty 10

## 2017-04-05 MED ORDER — MIDAZOLAM HCL 5 MG/5ML IJ SOLN
INTRAMUSCULAR | Status: DC | PRN
  Administered 2017-04-05 (×2): 2 mg via INTRAVENOUS

## 2017-04-05 MED ORDER — SODIUM CHLORIDE 0.9% FLUSH: 3.0000 mL | INTRAVENOUS | Status: DC | PRN

## 2017-04-05 MED ORDER — LIDOCAINE VISCOUS 2 % MT SOLN
OROMUCOSAL | Status: DC | PRN
  Administered 2017-04-05: 10 mL via OROMUCOSAL

## 2017-04-05 MED ORDER — KETAMINE HCL 10 MG/ML IJ SOLN
INTRAMUSCULAR | Status: DC | PRN
  Administered 2017-04-05: 15 mg via INTRAVENOUS
  Administered 2017-04-05: 30 mg via INTRAVENOUS

## 2017-04-05 MED ORDER — SODIUM CHLORIDE 0.9 % IV SOLN: 250.0000 mL | INTRAVENOUS | Status: DC

## 2017-04-05 MED ORDER — LIDOCAINE VISCOUS 2 % MT SOLN
OROMUCOSAL | Status: AC
  Filled 2017-04-05: qty 15

## 2017-04-05 MED ORDER — FUROSEMIDE 80 MG PO TABS: 80.0000 mg | ORAL_TABLET | Freq: Every day | ORAL | Status: DC

## 2017-04-05 MED ORDER — SODIUM CHLORIDE 0.9 % IV SOLN
INTRAVENOUS | Status: DC
  Administered 2017-04-05 (×2): via INTRAVENOUS

## 2017-04-05 MED ORDER — FUROSEMIDE 40 MG PO TABS
60.0000 mg | ORAL_TABLET | Freq: Every day | ORAL | Status: DC
  Administered 2017-04-05 – 2017-04-06 (×2): 60 mg via ORAL
  Filled 2017-04-05 (×2): qty 1

## 2017-04-05 MED ORDER — ETOMIDATE 2 MG/ML IV SOLN
INTRAVENOUS | Status: DC | PRN
  Administered 2017-04-05: 4 mg via INTRAVENOUS

## 2017-04-05 MED ORDER — SODIUM CHLORIDE 0.9% FLUSH
3.0000 mL | Freq: Two times a day (BID) | INTRAVENOUS | Status: DC
  Administered 2017-04-06 – 2017-04-08 (×2): 3 mL via INTRAVENOUS

## 2017-04-05 MED ORDER — PHENYLEPHRINE HCL 10 MG/ML IJ SOLN
INTRAVENOUS | Status: DC | PRN
  Administered 2017-04-05: 20 ug/min via INTRAVENOUS

## 2017-04-05 NOTE — Progress Notes (Signed)
  Advanced Heart Failure Rounding Note  Primary Cardiologist:  Dr. Betha Shadix   Subjective:    Remains on Amio 60/hr and milrinone 0.375.   Coox 61.4% this am on milrinone 0.25 mcg/kg/min.   Now on po lasix and stable. CVP 5-6  Feeling better. Slightly anxious about TEE/DCCV. Wants to go home, but mood much improved.   Negative 500 cc. Weight up 1 lb. Creatinine stable as above.   Remains in Afib in 100-110s  Objective:   Weight Range: 196 lb 11.2 oz (89.2 kg) Body mass index is 26.68 kg/m.   Vital Signs:   Temp:  [97.5 F (36.4 C)-98.3 F (36.8 C)] 98.3 F (36.8 C) (12/27 0754) Pulse Rate:  [104-111] 104 (12/27 0754) Resp:  [17-22] 19 (12/27 0754) BP: (90-111)/(70-79) 90/70 (12/27 0754) SpO2:  [95 %-97 %] 97 % (12/27 0754) Weight:  [196 lb 11.2 oz (89.2 kg)] 196 lb 11.2 oz (89.2 kg) (12/27 0510) Last BM Date: 04/04/17 Weight change: Filed Weights   04/03/17 0500 04/04/17 0458 04/05/17 0510  Weight: 202 lb 6.4 oz (91.8 kg) 195 lb 14.4 oz (88.9 kg) 196 lb 11.2 oz (89.2 kg)   Intake/Output:   Intake/Output Summary (Last 24 hours) at 04/05/2017 1034 Last data filed at 04/05/2017 0800 Gross per 24 hour  Intake 1712.11 ml  Output 2430 ml  Net -717.89 ml    Physical Exam   General:NAD HEENT: Normal Neck: Supple. JVP 6-7. Carotids 2+ bilat; no bruits. No thyromegaly or nodule noted. Cor: PMI lateral. Irregularly irregular. Tachy. + S3 Lungs: Minimal crackles at bases. No wheezing. Abdomen: Soft, non-tender, non-distended, no HSM. No bruits or masses. +BS  Extremities: No cyanosis, clubbing, or rash. R and LLE no edema. +SCDs Neuro: Alert & orientedx3, cranial nerves grossly intact. moves all 4 extremities w/o difficulty. Affect pleasant   Telemetry   Remains in Afib 100-120s, personally reviewed.   EKG   Remains in Afib this am in 110s, personally reviewed.   Labs    CBC No results for input(s): WBC, NEUTROABS, HGB, HCT, MCV, PLT in the last 72  hours. Basic Metabolic Panel Recent Labs    04/04/17 0429 04/05/17 0532  NA 135 132*  K 3.5 3.3*  CL 81* 85*  CO2 43* 40*  GLUCOSE 89 87  BUN 32* 26*  CREATININE 1.44* 1.24  CALCIUM 9.6 9.1   Liver Function Tests No results for input(s): AST, ALT, ALKPHOS, BILITOT, PROT, ALBUMIN in the last 72 hours. No results for input(s): LIPASE, AMYLASE in the last 72 hours. Cardiac Enzymes No results for input(s): CKTOTAL, CKMB, CKMBINDEX, TROPONINI in the last 72 hours.  BNP: BNP (last 3 results) No results for input(s): BNP in the last 8760 hours.  ProBNP (last 3 results) No results for input(s): PROBNP in the last 8760 hours.   D-Dimer No results for input(s): DDIMER in the last 72 hours. Hemoglobin A1C No results for input(s): HGBA1C in the last 72 hours. Fasting Lipid Panel No results for input(s): CHOL, HDL, LDLCALC, TRIG, CHOLHDL, LDLDIRECT in the last 72 hours. Thyroid Function Tests No results for input(s): TSH, T4TOTAL, T3FREE, THYROIDAB in the last 72 hours.  Invalid input(s): FREET3  Other results:   Imaging   No results found.  Medications:    Scheduled Medications: . apixaban  5 mg Oral BID  . furosemide  60 mg Oral Daily  . loratadine  10 mg Oral Daily  . mometasone-formoterol  2 puff Inhalation BID  . potassium chloride  40   mEq Oral TID  . sodium chloride flush  10-40 mL Intracatheter Q12H  . sodium chloride flush  3 mL Intravenous Q12H  . spironolactone  12.5 mg Oral Daily  . tiotropium  18 mcg Inhalation Daily    Infusions: . sodium chloride    . amiodarone 60 mg/hr (04/05/17 0712)  . milrinone 0.25 mcg/kg/min (04/04/17 1832)    PRN Medications: acetaminophen, albuterol, ALPRAZolam, calcium carbonate, ondansetron (ZOFRAN) IV, sodium chloride flush, sodium chloride flush, traZODone  Patient Profile   Reginald Hahn is a 66 y.o. male with h/o COPD admitted in 6/17 with acute systolic HF and AF with RVR in the setting of likely viral  illness.   Assessment/Plan   1.  PAF with RVR - Rate control improving on amio gtt but still elevated at times. Continue drip at 60.  - Has refused heparin due to previous life-threatening RP bleed however needs DC-CV due to AF tachy cardiomyopathy. - Tolerating Eliquis.  - Plan for TEE/DCCV this afternoon.  - CHA2DS2/VASc is at least 5.  2. Acute on Chronic Systolic HF -> cardiogenic shock - NICM. R/LHC 08/2016 with minimal non-obstructive CAD and EF  - Echo 03/28/17 EF 20-25%, Trivial AI, Mild RAE, Pa peak pressure 35 mm Hg. Worsened from 05/2016 where EF was 45-50%.  - Coox 61% this am. Continue milrinone 0.25 - CVP 5-6. Continue lasix 60 mg daily. May need to cut back once back in sinus.  - Supp K  3. h/o RP bleed - Have avoided heparin IV/SQ. (Pt also adamant to not receive this). On SCDs. See above discussion about Eliquis. Tolerating well.  4. AECOPD - Resolved. Can stop nebs.  - Continue chronic prednisone 10 mg daily.  - Finished 7 days of doxy.  5. AKI - Due to ATN/shock.  - resolved. Creatinine stable at 1.4. 6.  Hypokalemia -  Continue spiro 12.5 mg daily. Supp this am.  7. DVT Prophylaxis - Ted hose + SCDs.  Now on Eliquis. No change.  8. Anxiety - continue Xanax 0.25 tid prn. Stable.   Length of Stay: 10  [name], PA-C  04/05/2017, 10:34 AM  Advanced Heart Failure Team Pager [phone] (M-F; 7a - 4p)  Please contact [facility] for night-coverage after hours (4p -7a ) and weekends on [email or web address]  Patient seen and examined with the above-signed Advanced Practice Provider and/or Housestaff. I personally reviewed laboratory data, imaging studies and relevant notes. I independently examined the patient and formulated the important aspects of the plan. I have edited the note to reflect any of my changes or salient points. I have personally discussed the plan with the patient and/or family.  Remains in AF with RVR despite high-dose amio load. Tolerating  Eliquis without bleeding.   Volume status improving. Still on milrinone for low output. Will continue for now. Begin wean after DC-CV.   Continue to supp potassium. Can stop prednisone and doxy as COPD flare is resolved.   Zakaria Fromer, MD  2:02 PM    

## 2017-04-05 NOTE — Transfer of Care (Signed)
Immediate Anesthesia Transfer of Care Note  Patient: Reginald Hahn  Procedure(s) Performed: TRANSESOPHAGEAL ECHOCARDIOGRAM (TEE) (N/A ) CARDIOVERSION (N/A )  Patient Location: PACU and Endoscopy Unit  Anesthesia Type:MAC  Level of Consciousness: sedated, drowsy and patient cooperative  Airway & Oxygen Therapy: Patient Spontanous Breathing and Patient connected to nasal cannula oxygen  Post-op Assessment: Report given to RN and Post -op Vital signs reviewed and stable  Post vital signs: Reviewed and stable  Last Vitals:  Vitals:   04/05/17 1444 04/05/17 1450  BP: 112/90   Pulse: (!) 109 (!) 110  Resp: 16 20  Temp:  36.5 C  SpO2: 97% 95%    Last Pain:  Vitals:   04/05/17 1450  TempSrc: Oral  PainSc:       Patients Stated Pain Goal: 0 (16/01/09 3235)  Complications: No apparent anesthesia complications

## 2017-04-05 NOTE — CV Procedure (Signed)
   TRANSESOPHAGEAL ECHOCARDIOGRAM GUIDED DIRECT CURRENT CARDIOVERSION  NAME:  Reginald Hahn   MRN: [id number] DOB:  06/04/1950   ADMIT DATE: 03/26/2017  INDICATIONS: Atrial fibrilla tion with RVR   PROCEDURE:   Informed consent was obtained prior to the procedure. The risks, benefits and alternatives for the procedure were discussed and the patient comprehended these risks.  Risks include, but are not limited to, cough, sore throat, vomiting, nausea, somnolence, esophageal and stomach trauma or perforation, bleeding, low blood pressure, aspiration, pneumonia, infection, trauma to the teeth and death.    After a procedural time-out, the oropharynx was anesthetized and the patient was sedated by the anesthesia service. The transesophageal probe was inserted in the esophagus and stomach without difficulty and multiple views were obtained.   FINDINGS:  LEFT VENTRICLE: EF = Dilated EF 10-15% with global HK  RIGHT VENTRICLE: Moderate to severe global HK  LEFT ATRIUM: Moderately to severely dilated (5.2cm)  LEFT ATRIAL APPENDAGE: No clot  RIGHT ATRIUM: Dilated  AORTIC VALVE:  Trileaflet. Trivial AI. No AS.   MITRAL VALVE:    Normal. Trivial MR  TRICUSPID VALVE: Normal mild TR  PULMONIC VALVE: Grossly normal. Mild PI  INTERATRIAL SEPTUM: No PFO or ASD  PERICARDIUM: No effusion  DESCENDING AORTA: Moderate to severe plaque   CARDIOVERSION:     Indications:  Atrial Fibrillation  Procedure Details:  Once the TEE was complete, the patient had the defibrillator pads placed in the anterior and posterior position. Once an appropriate level of sedation was achieved, the patient received a single biphasic, synchronized 200J shock with conversion to sinus rhythm for several beats before reverting to AF with RVR. We shocked him again and he had the same result.    Arvilla Meres, MD  3:13 PM

## 2017-04-05 NOTE — Anesthesia Preprocedure Evaluation (Addendum)
Anesthesia Evaluation  Patient identified by MRN, date of birth, ID band Patient awake    Reviewed: Allergy & Precautions, NPO status , Patient's Chart, lab work & pertinent test results  Airway Mallampati: II  TM Distance: >3 FB Neck ROM: Full    Dental  (+) Poor Dentition, Dental Advisory Given   Pulmonary former smoker,    breath sounds clear to auscultation       Cardiovascular hypertension, +CHF  + dysrhythmias  Rhythm:Irregular Rate:Tachycardia  On milrinone and amiodarone gtts   Neuro/Psych    GI/Hepatic   Endo/Other    Renal/GU      Musculoskeletal  (+) Arthritis ,   Abdominal   Peds  Hematology   Anesthesia Other Findings   Reproductive/Obstetrics                            Anesthesia Physical Anesthesia Plan  ASA: IV  Anesthesia Plan: MAC   Post-op Pain Management:    Induction: Intravenous  PONV Risk Score and Plan: 1 and Treatment may vary due to age or medical condition  Airway Management Planned: Natural Airway and Nasal Cannula  Additional Equipment:   Intra-op Plan:   Post-operative Plan:   Informed Consent: I have reviewed the patients History and Physical, chart, labs and discussed the procedure including the risks, benefits and alternatives for the proposed anesthesia with the patient or authorized representative who has indicated his/her understanding and acceptance.     Plan Discussed with:   Anesthesia Plan Comments:         Anesthesia Quick Evaluation

## 2017-04-05 NOTE — Progress Notes (Signed)
  Echocardiogram Echocardiogram Transesophageal has been performed.  Reginald Hahn Reginald Hahn Reginald Hahn 04/05/2017, 2:38 PM

## 2017-04-05 NOTE — Interval H&P Note (Signed)
History and Physical Interval Note:  04/05/2017 2:04 PM  Reginald Hahn  has presented today for surgery, with the diagnosis of a fib  The various methods of treatment have been discussed with the patient and family. After consideration of risks, benefits and other options for treatment, the patient has consented to  Procedure(s): TRANSESOPHAGEAL ECHOCARDIOGRAM (TEE) (N/A) CARDIOVERSION (N/A) as a surgical intervention .  The patient's history has been reviewed, patient examined, no change in status, stable for surgery.  I have reviewed the patient's chart and labs.  Questions were answered to the patient's satisfaction.     Keagen Heinlen

## 2017-04-05 NOTE — Plan of Care (Signed)
  Progressing Education: Understanding of medication regimen will improve 04/05/2017 0756 - Progressing by Sharene Skeans, RN Activity: Ability to tolerate increased activity will improve 04/05/2017 0756 - Progressing by Sharene Skeans, RN Cardiac: Ability to achieve and maintain adequate cardiopulmonary perfusion will improve 04/05/2017 0756 - Progressing by Sharene Skeans, RN Health Behavior/Discharge Planning: Ability to safely manage health-related needs after discharge will improve 04/05/2017 0756 - Progressing by Sharene Skeans, RN

## 2017-04-05 NOTE — H&P (View-Only) (Signed)
Advanced Heart Failure Rounding Note  Primary Cardiologist:  Dr. Gala Romney   Subjective:    Remains on Amio 60/hr and milrinone 0.375.   Coox 61.4% this am on milrinone 0.25 mcg/kg/min.   Now on po lasix and stable. CVP 5-6  Feeling better. Slightly anxious about TEE/DCCV. Wants to go home, but mood much improved.   Negative 500 cc. Weight up 1 lb. Creatinine stable as above.   Remains in Afib in 100-110s  Objective:   Weight Range: 196 lb 11.2 oz (89.2 kg) Body mass index is 26.68 kg/m.   Vital Signs:   Temp:  [97.5 F (36.4 C)-98.3 F (36.8 C)] 98.3 F (36.8 C) (12/27 0754) Pulse Rate:  [104-111] 104 (12/27 0754) Resp:  [17-22] 19 (12/27 0754) BP: (90-111)/(70-79) 90/70 (12/27 0754) SpO2:  [95 %-97 %] 97 % (12/27 0754) Weight:  [196 lb 11.2 oz (89.2 kg)] 196 lb 11.2 oz (89.2 kg) (12/27 0510) Last BM Date: 04/04/17 Weight change: Filed Weights   04/03/17 0500 04/04/17 0458 04/05/17 0510  Weight: 202 lb 6.4 oz (91.8 kg) 195 lb 14.4 oz (88.9 kg) 196 lb 11.2 oz (89.2 kg)   Intake/Output:   Intake/Output Summary (Last 24 hours) at 04/05/2017 1034 Last data filed at 04/05/2017 0800 Gross per 24 hour  Intake 1712.11 ml  Output 2430 ml  Net -717.89 ml    Physical Exam   General:NAD HEENT: Normal Neck: Supple. JVP 6-7. Carotids 2+ bilat; no bruits. No thyromegaly or nodule noted. Cor: PMI lateral. Irregularly irregular. Tachy. + S3 Lungs: Minimal crackles at bases. No wheezing. Abdomen: Soft, non-tender, non-distended, no HSM. No bruits or masses. +BS  Extremities: No cyanosis, clubbing, or rash. R and LLE no edema. +SCDs Neuro: Alert & orientedx3, cranial nerves grossly intact. moves all 4 extremities w/o difficulty. Affect pleasant   Telemetry   Remains in Afib 100-120s, personally reviewed.   EKG   Remains in Afib this am in 110s, personally reviewed.   Labs    CBC No results for input(s): WBC, NEUTROABS, HGB, HCT, MCV, PLT in the last 72  hours. Basic Metabolic Panel Recent Labs    46/65/99 0429 04/05/17 0532  NA 135 132*  K 3.5 3.3*  CL 81* 85*  CO2 43* 40*  GLUCOSE 89 87  BUN 32* 26*  CREATININE 1.44* 1.24  CALCIUM 9.6 9.1   Liver Function Tests No results for input(s): AST, ALT, ALKPHOS, BILITOT, PROT, ALBUMIN in the last 72 hours. No results for input(s): LIPASE, AMYLASE in the last 72 hours. Cardiac Enzymes No results for input(s): CKTOTAL, CKMB, CKMBINDEX, TROPONINI in the last 72 hours.  BNP: BNP (last 3 results) No results for input(s): BNP in the last 8760 hours.  ProBNP (last 3 results) No results for input(s): PROBNP in the last 8760 hours.   D-Dimer No results for input(s): DDIMER in the last 72 hours. Hemoglobin A1C No results for input(s): HGBA1C in the last 72 hours. Fasting Lipid Panel No results for input(s): CHOL, HDL, LDLCALC, TRIG, CHOLHDL, LDLDIRECT in the last 72 hours. Thyroid Function Tests No results for input(s): TSH, T4TOTAL, T3FREE, THYROIDAB in the last 72 hours.  Invalid input(s): FREET3  Other results:   Imaging   No results found.  Medications:    Scheduled Medications: . apixaban  5 mg Oral BID  . furosemide  60 mg Oral Daily  . loratadine  10 mg Oral Daily  . mometasone-formoterol  2 puff Inhalation BID  . potassium chloride  40  mEq Oral TID  . sodium chloride flush  10-40 mL Intracatheter Q12H  . sodium chloride flush  3 mL Intravenous Q12H  . spironolactone  12.5 mg Oral Daily  . tiotropium  18 mcg Inhalation Daily    Infusions: . sodium chloride    . amiodarone 60 mg/hr (04/05/17 0712)  . milrinone 0.25 mcg/kg/min (04/04/17 1832)    PRN Medications: acetaminophen, albuterol, ALPRAZolam, calcium carbonate, ondansetron (ZOFRAN) IV, sodium chloride flush, sodium chloride flush, traZODone  Patient Profile   Reginald Hahn is a 66 y.o. male with h/o COPD admitted in 6/17 with acute systolic HF and AF with RVR in the setting of likely viral  illness.   Assessment/Plan   1.  PAF with RVR - Rate control improving on amio gtt but still elevated at times. Continue drip at 60.  - Has refused heparin due to previous life-threatening RP bleed however needs DC-CV due to AF tachy cardiomyopathy. - Tolerating Eliquis.  - Plan for TEE/DCCV this afternoon.  - CHA2DS2/VASc is at least 5.  2. Acute on Chronic Systolic HF -> cardiogenic shock - NICM. R/LHC 08/2016 with minimal non-obstructive CAD and EF  - Echo 03/28/17 EF 20-25%, Trivial AI, Mild RAE, Pa peak pressure 35 mm Hg. Worsened from 05/2016 where EF was 45-50%.  - Coox 61% this am. Continue milrinone 0.25 - CVP 5-6. Continue lasix 60 mg daily. May need to cut back once back in sinus.  - Supp K  3. h/o RP bleed - Have avoided heparin IV/SQ. (Pt also adamant to not receive this). On SCDs. See above discussion about Eliquis. Tolerating well.  4. AECOPD - Resolved. Can stop nebs.  - Continue chronic prednisone 10 mg daily.  - Finished 7 days of doxy.  5. AKI - Due to ATN/shock.  - resolved. Creatinine stable at 1.4. 6.  Hypokalemia -  Continue spiro 12.5 mg daily. Supp this am.  7. DVT Prophylaxis - Ted hose + SCDs.  Now on Eliquis. No change.  8. Anxiety - continue Xanax 0.25 tid prn. Stable.   Length of Stay: [address]  [employer]  04/05/2017, 10:34 AM  Advanced Heart Failure Team Pager [phone] (M-F; 7a - 4p)  Please contact [facility] for night-coverage after hours (4p -7a ) and weekends on [email or web address]  Patient seen and examined with the above-signed Advanced Practice Provider and/or Housestaff. I personally reviewed laboratory data, imaging studies and relevant notes. I independently examined the patient and formulated the important aspects of the plan. I have edited the note to reflect any of my changes or salient points. I have personally discussed the plan with the patient and/or family.  Remains in AF with RVR despite high-dose amio load. Tolerating  Eliquis without bleeding.   Volume status improving. Still on milrinone for low output. Will continue for now. Begin wean after DC-CV.   Continue to supp potassium. Can stop prednisone and doxy as COPD flare is resolved.   Arvilla Meres, MD  2:02 PM

## 2017-04-05 NOTE — Anesthesia Procedure Notes (Signed)
Procedure Name: MAC Date/Time: 04/05/2017 2:16 PM Performed by: Izora Gala, CRNA Pre-anesthesia Checklist: Patient identified, Emergency Drugs available, Suction available and Patient being monitored Patient Re-evaluated:Patient Re-evaluated prior to induction Oxygen Delivery Method: Simple face mask and Nasal cannula Preoxygenation: Pre-oxygenation with 100% oxygen Induction Type: IV induction Placement Confirmation: positive ETCO2 Dental Injury: Teeth and Oropharynx as per pre-operative assessment

## 2017-04-05 NOTE — Care Management Important Message (Signed)
Important Message  Patient Details  Name: Reginald Hahn MRN: [id number] Date of Birth: 06-21-1950   Medicare Important Message Given:  Yes    [name] 04/05/2017, 12:12 PM

## 2017-04-06 DIAGNOSIS — N179 Acute kidney failure, unspecified: Secondary | ICD-10-CM

## 2017-04-06 LAB — BASIC METABOLIC PANEL
ANION GAP: 11 (ref 5–15)
BUN: 31 mg/dL — ABNORMAL HIGH (ref 6–20)
CO2: 33 mmol/L — ABNORMAL HIGH (ref 22–32)
Calcium: 8.6 mg/dL — ABNORMAL LOW (ref 8.9–10.3)
Chloride: 86 mmol/L — ABNORMAL LOW (ref 101–111)
Creatinine, Ser: 1.44 mg/dL — ABNORMAL HIGH (ref 0.61–1.24)
GFR calc Af Amer: 57 mL/min — ABNORMAL LOW (ref >60)
GFR, EST NON AFRICAN AMERICAN: 50 mL/min — AB (ref >60)
Glucose, Bld: 199 mg/dL — ABNORMAL HIGH (ref 65–99)
POTASSIUM: 4 mmol/L (ref 3.5–5.1)
SODIUM: 130 mmol/L — AB (ref 135–145)

## 2017-04-06 LAB — COOXEMETRY PANEL
CARBOXYHEMOGLOBIN: 1 % (ref 0.5–1.5)
Carboxyhemoglobin: 1.3 % (ref 0.5–1.5)
METHEMOGLOBIN: 0.7 % (ref 0.0–1.5)
METHEMOGLOBIN: 1 % (ref 0.0–1.5)
O2 SAT: 54.6 %
O2 Saturation: 43.6 %
TOTAL HEMOGLOBIN: 13.9 g/dL (ref 12.0–16.0)
Total hemoglobin: 13.6 g/dL (ref 12.0–16.0)

## 2017-04-06 LAB — MAGNESIUM: Magnesium: 2 mg/dL (ref 1.7–2.4)

## 2017-04-06 MED ORDER — FUROSEMIDE 40 MG PO TABS
40.0000 mg | ORAL_TABLET | Freq: Every day | ORAL | Status: DC
  Administered 2017-04-07: 40 mg via ORAL
  Filled 2017-04-06: qty 1

## 2017-04-06 MED ORDER — AMIODARONE HCL IN DEXTROSE 360-4.14 MG/200ML-% IV SOLN
30.0000 mg/h | INTRAVENOUS | Status: DC
  Administered 2017-04-06 (×2): 30 mg/h via INTRAVENOUS
  Filled 2017-04-06 (×2): qty 200

## 2017-04-06 MED ORDER — MILRINONE LACTATE IN DEXTROSE 20-5 MG/100ML-% IV SOLN
0.1250 ug/kg/min | INTRAVENOUS | Status: DC
  Administered 2017-04-06 (×2): 0.125 ug/kg/min via INTRAVENOUS
  Filled 2017-04-06: qty 100

## 2017-04-06 NOTE — Progress Notes (Signed)
Advanced Heart Failure Rounding Note  Primary Cardiologist:  Dr. Gala Romney   Subjective:    Underwent TEE/DCCV yesterday, but did not go back into NSR despite 2 shocks however converted last night. Remains on Amio 60/hr and milrinone 0.25  TEE 12/27 EF 10-15% moderate to severe RV dysfunction LA 5.2 cm  CVP 5 on po lasix 60 daily.   Breathing better but feels jittery. Getting xanax tid. Tolerating Eliquis without bleeding   Weight down another pound. Weight down 21 pounds total. Creatinine stable 1.4  Objective:   Weight Range: 195 lb 14.4 oz (88.9 kg) Body mass index is 26.57 kg/m.   Vital Signs:   Temp:  [97.6 F (36.4 C)-99.3 F (37.4 C)] 98.3 F (36.8 C) (12/28 0758) Pulse Rate:  [75-113] 77 (12/28 0758) Resp:  [15-25] 22 (12/28 0758) BP: (85-112)/(56-90) 111/75 (12/28 0758) SpO2:  [94 %-100 %] 100 % (12/28 0758) Weight:  [195 lb 14.4 oz (88.9 kg)] 195 lb 14.4 oz (88.9 kg) (12/28 0430) Last BM Date: 04/04/17 Weight change: Filed Weights   04/04/17 0458 04/05/17 0510 04/06/17 0430  Weight: 195 lb 14.4 oz (88.9 kg) 196 lb 11.2 oz (89.2 kg) 195 lb 14.4 oz (88.9 kg)   Intake/Output:   Intake/Output Summary (Last 24 hours) at 04/06/2017 0809 Last data filed at 04/06/2017 0759 Gross per 24 hour  Intake 1643.53 ml  Output 2150 ml  Net -506.47 ml    Physical Exam   General: NAD  HEENT: Normal Neck: Supple. JVP 5-6. Carotids 2+ bilat; no bruits. No thyromegaly or nodule noted. Cor: PMI laterally displaced. RRR  Lungs: Diminished basilar sounds. No wheeze. Abdomen: Soft, non-tender, non-distended, no HSM. No bruits or masses. +BS  Extremities: No cyanosis, clubbing, or rash. R and LLE no edema. +SCDs Neuro: Alert & orientedx3, cranial nerves grossly intact. moves all 4 extremities w/o difficulty. Affect pleasant    Telemetry   NSR 70-80s Personally reviewed   EKG   EKG this am shows NSR at 73 bpm, personally reviewed.   Labs    CBC No results  for input(s): WBC, NEUTROABS, HGB, HCT, MCV, PLT in the last 72 hours. Basic Metabolic Panel Recent Labs    11/91/47 0532 04/06/17 0424  NA 132* 130*  K 3.3* 4.0  CL 85* 86*  CO2 40* 33*  GLUCOSE 87 199*  BUN 26* 31*  CREATININE 1.24 1.44*  CALCIUM 9.1 8.6*   Liver Function Tests No results for input(s): AST, ALT, ALKPHOS, BILITOT, PROT, ALBUMIN in the last 72 hours. No results for input(s): LIPASE, AMYLASE in the last 72 hours. Cardiac Enzymes No results for input(s): CKTOTAL, CKMB, CKMBINDEX, TROPONINI in the last 72 hours.  BNP: BNP (last 3 results) No results for input(s): BNP in the last 8760 hours.  ProBNP (last 3 results) No results for input(s): PROBNP in the last 8760 hours.   D-Dimer No results for input(s): DDIMER in the last 72 hours. Hemoglobin A1C No results for input(s): HGBA1C in the last 72 hours. Fasting Lipid Panel No results for input(s): CHOL, HDL, LDLCALC, TRIG, CHOLHDL, LDLDIRECT in the last 72 hours. Thyroid Function Tests No results for input(s): TSH, T4TOTAL, T3FREE, THYROIDAB in the last 72 hours.  Invalid input(s): FREET3  Other results:   Imaging   No results found.  Medications:    Scheduled Medications: . apixaban  5 mg Oral BID  . furosemide  60 mg Oral Daily  . loratadine  10 mg Oral Daily  . mometasone-formoterol  2 puff Inhalation BID  . potassium chloride  40 mEq Oral TID  . sodium chloride flush  10-40 mL Intracatheter Q12H  . sodium chloride flush  3 mL Intravenous Q12H  . spironolactone  12.5 mg Oral Daily  . tiotropium  18 mcg Inhalation Daily    Infusions: . sodium chloride    . sodium chloride Stopped (04/05/17 1430)  . amiodarone 60 mg/hr (04/06/17 0554)  . milrinone 0.25 mcg/kg/min (04/06/17 0153)    PRN Medications: acetaminophen, albuterol, ALPRAZolam, calcium carbonate, ondansetron (ZOFRAN) IV, sodium chloride flush, sodium chloride flush, traZODone  Patient Profile   Reginald Hahn is a 66 y.o.  male with h/o COPD admitted in 6/17 with acute systolic HF and AF with RVR in the setting of likely viral illness.   Assessment/Plan   1.  PAF with RVR - EF down 45-50% -> 15% in setting of recurrent AF with RVR. Intially refused AC due to previous life-threatening RP bleed on heparin. Did agree to Eliquis.  - Underwent  TEE/DC-CV on 12/27. Failed cardioversion initially but converted overnight - Continue Eliquis.  - Continue amio drip overnight. Switch to po in am.  - CHA2DS2/VASc is at least 5.  2. Acute on Chronic Systolic HF -> cardiogenic shock - NICM. R/LHC 08/2016 with minimal non-obstructive CAD and EF  - Echo 03/28/17 EF 20-25%, Trivial AI, Mild RAE, Pa peak pressure 35 mm Hg. Worsened from 05/2016 where EF was 45-50%.  - Coox 54.6% this am on milrinone 0.25. Will cut to 0.125 - CVP 5. Decrease  lasix to 40 mg daily. May need to cut back once back in sinus.  - Supp K  3. h/o RP bleed - Have avoided heparin IV/SQ. (Pt also adamant to not receive this). On SCDs. See above discussion about Eliquis. Tolerating well without recurrent bleed.  4. AECOPD - Resolved.  - Now off nebs and prednisone.  - Finished 7 days of doxy.  5. AKI - Due to ATN/shock.  - Resolved. Creatinine stable at 1.4. 6.  Hypokalemia - Continue spiro 12.5 mg daily. Stable this am.  - Check Mg 7. DVT Prophylaxis - Ted hose + SCDs.  Now on Eliquis. No change 8. Anxiety - continue Xanax 0.25 tid prn. Stable.     Plan for weekend: 1) Continue IV amio today. Switch to 200 bid tomorrow 2) Wean milrinone to off 3) Continue Eliquis 4) adjust HF meds as needed 5) Possible home Monday 6) CR to see.  Length of Stay: 11  Arvilla Meres, MD  11:46 AM   Advanced Heart Failure Team Pager [phone] (M-F; 7a - 4p)  Please contact [facility] for night-coverage after hours (4p -7a ) and weekends on [email or web address]

## 2017-04-06 NOTE — Anesthesia Postprocedure Evaluation (Signed)
Anesthesia Post Note  Patient: [place] Gipe  Procedure(s) Performed: TRANSESOPHAGEAL ECHOCARDIOGRAM (TEE) (N/A ) CARDIOVERSION (N/A )     Patient location during evaluation: Endoscopy Anesthesia Type: MAC Level of consciousness: awake and alert Pain management: pain level controlled Vital Signs Assessment: post-procedure vital signs reviewed and stable Respiratory status: spontaneous breathing, nonlabored ventilation, respiratory function stable and patient connected to nasal cannula oxygen Cardiovascular status: stable and blood pressure returned to baseline Postop Assessment: no apparent nausea or vomiting Anesthetic complications: no    Last Vitals:  Vitals:   04/06/17 0900 04/06/17 1215  BP: (!) 156/75 101/79  Pulse: 61 71  Resp: 17 19  Temp:  36.7 C  SpO2: 99% 99%    Last Pain:  Vitals:   04/06/17 1215  TempSrc: Oral  PainSc:                  Ronnett Pullin,JAMES TERRILL

## 2017-04-06 NOTE — Progress Notes (Signed)
Came to ambulate however he just went to sleep after not sleeping at all last night. Wife asks [name] to come later. Will f/u in am. Ethelda Chick CES, ACSM 2:41 PM 04/06/2017

## 2017-04-06 NOTE — Plan of Care (Signed)
  Safety: Ability to remain free from injury will improve 04/06/2017 0430 - Completed/Met by Tish Frederickson, RN   Pain Managment: General experience of comfort will improve 04/06/2017 0430 - Completed/Met by Tish Frederickson, RN   Coping: Level of anxiety will decrease 04/06/2017 0430 - Progressing by Tish Frederickson, RN

## 2017-04-07 DIAGNOSIS — I5043 Acute on chronic combined systolic (congestive) and diastolic (congestive) heart failure: Secondary | ICD-10-CM

## 2017-04-07 DIAGNOSIS — I481 Persistent atrial fibrillation: Secondary | ICD-10-CM

## 2017-04-07 LAB — BASIC METABOLIC PANEL
ANION GAP: 11 (ref 5–15)
BUN: 24 mg/dL — ABNORMAL HIGH (ref 6–20)
CALCIUM: 8.6 mg/dL — AB (ref 8.9–10.3)
CO2: 31 mmol/L (ref 22–32)
CREATININE: 1.34 mg/dL — AB (ref 0.61–1.24)
Chloride: 93 mmol/L — ABNORMAL LOW (ref 101–111)
GFR, EST NON AFRICAN AMERICAN: 54 mL/min — AB (ref >60)
Glucose, Bld: 179 mg/dL — ABNORMAL HIGH (ref 65–99)
Potassium: 3.6 mmol/L (ref 3.5–5.1)
SODIUM: 135 mmol/L (ref 135–145)

## 2017-04-07 LAB — COOXEMETRY PANEL
CARBOXYHEMOGLOBIN: 1.1 % (ref 0.5–1.5)
METHEMOGLOBIN: 1.1 % (ref 0.0–1.5)
O2 SAT: 58.5 %
Total hemoglobin: 14 g/dL (ref 12.0–16.0)

## 2017-04-07 MED ORDER — SODIUM CHLORIDE 0.9 % IV SOLN: 250.0000 mL | INTRAVENOUS | Status: DC

## 2017-04-07 MED ORDER — AMIODARONE LOAD VIA INFUSION
150.0000 mg | Freq: Once | INTRAVENOUS | Status: AC
  Administered 2017-04-07: 150 mg via INTRAVENOUS
  Filled 2017-04-07: qty 83.34

## 2017-04-07 MED ORDER — AMIODARONE HCL IN DEXTROSE 360-4.14 MG/200ML-% IV SOLN
INTRAVENOUS | Status: AC
  Filled 2017-04-07: qty 200

## 2017-04-07 MED ORDER — DIGOXIN 125 MCG PO TABS
0.1250 mg | ORAL_TABLET | Freq: Every day | ORAL | Status: DC
  Administered 2017-04-07 – 2017-04-10 (×4): 0.125 mg via ORAL
  Filled 2017-04-07 (×4): qty 1

## 2017-04-07 MED ORDER — AMIODARONE HCL IN DEXTROSE 360-4.14 MG/200ML-% IV SOLN
30.0000 mg/h | INTRAVENOUS | Status: DC
  Administered 2017-04-07 – 2017-04-09 (×5): 30 mg/h via INTRAVENOUS
  Filled 2017-04-07 (×6): qty 200

## 2017-04-07 MED ORDER — AMIODARONE HCL IN DEXTROSE 360-4.14 MG/200ML-% IV SOLN
60.0000 mg/h | INTRAVENOUS | Status: AC
  Administered 2017-04-07: 60 mg/h via INTRAVENOUS

## 2017-04-07 MED ORDER — SODIUM CHLORIDE 0.9% FLUSH
3.0000 mL | Freq: Two times a day (BID) | INTRAVENOUS | Status: DC
  Administered 2017-04-08 (×2): 3 mL via INTRAVENOUS

## 2017-04-07 MED ORDER — SODIUM CHLORIDE 0.9% FLUSH: 3.0000 mL | INTRAVENOUS | Status: DC | PRN

## 2017-04-07 NOTE — Progress Notes (Addendum)
CARDIAC REHAB PHASE I   PRE:  Rate/Rhythm: 70 SR   BP:  Sitting: 101/76      SaO2: 96% RA   MODE:  Ambulation: 520 ft   POST:  Rate/Rhythm: 82 SR  BP:  Sitting: 114/86      SaO2: 97% RA   Pt in bed with wife at beside. Pt very eager to walk. Pt ambulated 520 ft with steady gait using RW. PT denied any complaints of CP and dizziness. Pt stated he was tired toward end of walk with mild shortness of breath. Pt returned to bed per patient request. Education completed with pt, wife and daughter at beside. Reviewed CHF booklet, daily weights, low sodium diet, exercise guidelines, outpatient cardiac rehab. Pt and family voiced understanding. Pt and family very receptive to education and outpatient cardiac rehab. Will send CRPII referral to [place] per pt request.    [id number]  York Cerise MS, ACSM CEP  2:42 PM 04/07/2017

## 2017-04-07 NOTE — Progress Notes (Signed)
Patient ID: Reginald Hahn, male   DOB: 08/11/1950, 66 y.o.   MRN: [id number]     Advanced Heart Failure Rounding Note  Primary Cardiologist:  Dr. Gala Romney   Subjective:    Underwent TEE/DCCV 12/27, but did not go back into NSR despite 2 shocks however converted overnight to NSR. Went back into atrial fibrillation am 12/29.    I/Os negative.  Co-ox 58.5% this morning.  Remains on milrinone 0.125.  SBP 90s-100s.  No dyspnea at rest.   TEE 12/27 EF 10-15% moderate to severe RV dysfunction LA 5.2 cm   Objective:   Weight Range: 196 lb 12.8 oz (89.3 kg) Body mass index is 26.69 kg/m.   Vital Signs:   Temp:  [97.6 F (36.4 C)-98 F (36.7 C)] 97.6 F (36.4 C) (12/29 0810) Pulse Rate:  [66-98] 98 (12/29 0810) Resp:  [18-26] 26 (12/29 0810) BP: (87-106)/(63-79) 87/66 (12/29 0810) SpO2:  [96 %-99 %] 97 % (12/29 0810) Weight:  [196 lb 12.8 oz (89.3 kg)] 196 lb 12.8 oz (89.3 kg) (12/29 0500) Last BM Date: 04/06/17 Weight change: Filed Weights   04/05/17 0510 04/06/17 0430 04/07/17 0500  Weight: 196 lb 11.2 oz (89.2 kg) 195 lb 14.4 oz (88.9 kg) 196 lb 12.8 oz (89.3 kg)   Intake/Output:   Intake/Output Summary (Last 24 hours) at 04/07/2017 0934 Last data filed at 04/07/2017 0813 Gross per 24 hour  Intake 989.28 ml  Output 2450 ml  Net -1460.72 ml    Physical Exam   General: NAD Neck: No JVD, no thyromegaly or thyroid nodule.  Lungs: Mildly decreased breath sounds bilaterally.  CV: Nondisplaced PMI.  Heart mildly tachy, irregular S1/S2, no S3/S4, no murmur.  No peripheral edema.   Abdomen: Soft, nontender, no hepatosplenomegaly, no distention.  Skin: Intact without lesions or rashes.  Neurologic: Alert and oriented x 3.  Psych: Normal affect. Extremities: No clubbing or cyanosis.  HEENT: Normal.    Telemetry   Atrial fibrillation 100s Personally reviewed  Labs    CBC No results for input(s): WBC, NEUTROABS, HGB, HCT, MCV, PLT in the last 72 hours. Basic Metabolic  Panel Recent Labs    04/06/17 0424 04/07/17 0500  NA 130* 135  K 4.0 3.6  CL 86* 93*  CO2 33* 31  GLUCOSE 199* 179*  BUN 31* 24*  CREATININE 1.44* 1.34*  CALCIUM 8.6* 8.6*  MG 2.0  --    Liver Function Tests No results for input(s): AST, ALT, ALKPHOS, BILITOT, PROT, ALBUMIN in the last 72 hours. No results for input(s): LIPASE, AMYLASE in the last 72 hours. Cardiac Enzymes No results for input(s): CKTOTAL, CKMB, CKMBINDEX, TROPONINI in the last 72 hours.  BNP: BNP (last 3 results) No results for input(s): BNP in the last 8760 hours.  ProBNP (last 3 results) No results for input(s): PROBNP in the last 8760 hours.   D-Dimer No results for input(s): DDIMER in the last 72 hours. Hemoglobin A1C No results for input(s): HGBA1C in the last 72 hours. Fasting Lipid Panel No results for input(s): CHOL, HDL, LDLCALC, TRIG, CHOLHDL, LDLDIRECT in the last 72 hours. Thyroid Function Tests No results for input(s): TSH, T4TOTAL, T3FREE, THYROIDAB in the last 72 hours.  Invalid input(s): FREET3  Other results:   Imaging   No results found.  Medications:    Scheduled Medications: . amiodarone  150 mg Intravenous Once  . apixaban  5 mg Oral BID  . digoxin  0.125 mg Oral Daily  . furosemide  40 mg Oral  Daily  . loratadine  10 mg Oral Daily  . mometasone-formoterol  2 puff Inhalation BID  . potassium chloride  40 mEq Oral TID  . sodium chloride flush  10-40 mL Intracatheter Q12H  . sodium chloride flush  3 mL Intravenous Q12H  . spironolactone  12.5 mg Oral Daily  . tiotropium  18 mcg Inhalation Daily    Infusions: . sodium chloride    . sodium chloride Stopped (04/05/17 1430)  . amiodarone     Followed by  . amiodarone      PRN Medications: acetaminophen, albuterol, ALPRAZolam, calcium carbonate, ondansetron (ZOFRAN) IV, sodium chloride flush, sodium chloride flush, traZODone  Patient Profile   Reginald Hahn is a 66 y.o. male with h/o COPD admitted in 6/17  with acute systolic HF and AF with RVR in the setting of likely viral illness.   Assessment/Plan   1.  PAF with RVR: EF down 45-50% -> 15% in setting of recurrent AF with RVR. Intially refused AC due to previous life-threatening RP bleed on heparin. Did agree to Eliquis. Underwent  TEE/DC-CV on 12/27. Failed cardioversion initially but converted overnight.  Went back into atrial fibrillation early am 12/28.  - Continue Eliquis.  - Rebolus amiodarone and stop milrinone.  If he does not convert to NSR overnight, will need repeat DCCV.  Will keep NPO after midnight.  2. Acute on Chronic Systolic HF -> cardiogenic shock: NICM. R/LHC 08/2016 with minimal non-obstructive CAD.  Echo 03/28/17 EF 20-25%, Trivial AI, Mild RAE, Pa peak pressure 35 mm Hg. Worsened from 05/2016 where EF was 45-50%. 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 He is not volume overloaded on exam.  Co-ox 58.5% today.  Creatinine trending down.  - Stop milrinone, may help keep him out of atrial fibrillation.  - Add digoxin 0.125 daily.  - Continue Lasix 40 mg daily and supplemental K.  - Continue spironolactone 12.5 daily.  - Will not start ARB yet with soft BP.  3. h/o RP bleed: Have avoided heparin IV/SQ. (Pt also adamant to not receive this). On SCDs. See above discussion about Eliquis. Tolerating well without recurrent bleed.  4. AECOPD: Resolved. Now off nebs and prednisone. Finished 7 days of doxy. No wheezing on exam.  5. AKI: Due to ATN/shock.  - Resolved. Creatinine stable at 1.34. 6.  Hypokalemia: Resolved.  7. DVT Prophylaxis: Ted hose + SCDs.  Now on Eliquis. No change 8. Anxiety: continue Xanax 0.25 tid prn. Stable.    Walk with CR.   Length of Stay: 12  Marca Ancona, MD  9:34 AM   Advanced Heart Failure Team Pager [phone] (M-F; 7a - 4p)  Please contact [facility] for night-coverage after hours (4p -7a ) and weekends on [email or web address]

## 2017-04-07 NOTE — Progress Notes (Signed)
Unable to obtain blood pull back from [facility] for labs. IV team to assess and tpa picc.

## 2017-04-08 LAB — BASIC METABOLIC PANEL
ANION GAP: 9 (ref 5–15)
BUN: 23 mg/dL — AB (ref 6–20)
CO2: 29 mmol/L (ref 22–32)
Calcium: 8.6 mg/dL — ABNORMAL LOW (ref 8.9–10.3)
Chloride: 95 mmol/L — ABNORMAL LOW (ref 101–111)
Creatinine, Ser: 1.32 mg/dL — ABNORMAL HIGH (ref 0.61–1.24)
GFR calc Af Amer: >60 mL/min (ref >60)
GFR, EST NON AFRICAN AMERICAN: 55 mL/min — AB (ref >60)
GLUCOSE: 89 mg/dL (ref 65–99)
POTASSIUM: 4.4 mmol/L (ref 3.5–5.1)
Sodium: 133 mmol/L — ABNORMAL LOW (ref 135–145)

## 2017-04-08 LAB — COOXEMETRY PANEL
CARBOXYHEMOGLOBIN: 1.5 % (ref 0.5–1.5)
METHEMOGLOBIN: 0.8 % (ref 0.0–1.5)
O2 Saturation: 54.9 %
TOTAL HEMOGLOBIN: 14 g/dL (ref 12.0–16.0)

## 2017-04-08 MED ORDER — FUROSEMIDE 10 MG/ML IJ SOLN
40.0000 mg | Freq: Two times a day (BID) | INTRAMUSCULAR | Status: DC
  Administered 2017-04-08 – 2017-04-09 (×4): 40 mg via INTRAVENOUS
  Filled 2017-04-08 (×5): qty 4

## 2017-04-08 MED ORDER — LOSARTAN POTASSIUM 25 MG PO TABS
12.5000 mg | ORAL_TABLET | Freq: Every day | ORAL | Status: DC
  Administered 2017-04-08 – 2017-04-10 (×3): 12.5 mg via ORAL
  Filled 2017-04-08 (×3): qty 1

## 2017-04-08 NOTE — Progress Notes (Signed)
Patient ID: Reginald Hahn, male   DOB: 06-01-1950, 66 y.o.   MRN: [id number]     Advanced Heart Failure Rounding Note  Primary Cardiologist:  Dr. Gala Romney   Subjective:    Underwent TEE/DCCV 12/27, but did not go back into NSR despite 2 shocks however converted overnight to NSR. Went back into atrial fibrillation am 12/29.  Milrinone stopped 12/29 and amiodarone re-bolused.   This morning, he is back in NSR.  Co-ox 55% off milrinone.  CVP 14, weight up 1 lb.  Walked in hall yesterday without much problem.  Still feels mildly dyspneic. SBP 100s.   TEE 12/27 EF 10-15% moderate to severe RV dysfunction LA 5.2 cm  ECG (12/30): NSR, 1st degree AVB, LBBB QRS 150 msec   Objective:   Weight Range: 197 lb 4.8 oz (89.5 kg) Body mass index is 26.76 kg/m.   Vital Signs:   Temp:  [97.7 F (36.5 C)-98.7 F (37.1 C)] 97.7 F (36.5 C) (12/30 0848) Pulse Rate:  [65] 65 (12/30 0453) Resp:  [24-26] 25 (12/30 0848) BP: (101-107)/(76-82) 101/76 (12/30 0848) SpO2:  [98 %-100 %] 98 % (12/30 0453) Weight:  [197 lb 4.8 oz (89.5 kg)] 197 lb 4.8 oz (89.5 kg) (12/30 0453) Last BM Date: 04/06/17 Weight change: Filed Weights   04/06/17 0430 04/07/17 0500 04/08/17 0453  Weight: 195 lb 14.4 oz (88.9 kg) 196 lb 12.8 oz (89.3 kg) 197 lb 4.8 oz (89.5 kg)   Intake/Output:   Intake/Output Summary (Last 24 hours) at 04/08/2017 1032 Last data filed at 04/07/2017 2252 Gross per 24 hour  Intake 561.54 ml  Output 350 ml  Net 211.54 ml    Physical Exam   General: NAD Neck: JVP 10-12 cm, no thyromegaly or thyroid nodule.  Lungs: Distant BS bilaterally.  CV: Nondisplaced PMI.  Heart regular S1/S2, no S3/S4, no murmur.  No peripheral edema.   Abdomen: Soft, nontender, no hepatosplenomegaly, no distention.  Skin: Intact without lesions or rashes.  Neurologic: Alert and oriented x 3.  Psych: Normal affect. Extremities: No clubbing or cyanosis.  HEENT: Normal.    Telemetry   NSR with 1st degree AVB  Personally reviewed  Labs    CBC No results for input(s): WBC, NEUTROABS, HGB, HCT, MCV, PLT in the last 72 hours. Basic Metabolic Panel Recent Labs    48/01/65 0424 04/07/17 0500 04/08/17 0440  NA 130* 135 133*  K 4.0 3.6 4.4  CL 86* 93* 95*  CO2 33* 31 29  GLUCOSE 199* 179* 89  BUN 31* 24* 23*  CREATININE 1.44* 1.34* 1.32*  CALCIUM 8.6* 8.6* 8.6*  MG 2.0  --   --    Liver Function Tests No results for input(s): AST, ALT, ALKPHOS, BILITOT, PROT, ALBUMIN in the last 72 hours. No results for input(s): LIPASE, AMYLASE in the last 72 hours. Cardiac Enzymes No results for input(s): CKTOTAL, CKMB, CKMBINDEX, TROPONINI in the last 72 hours.  BNP: BNP (last 3 results) No results for input(s): BNP in the last 8760 hours.  ProBNP (last 3 results) No results for input(s): PROBNP in the last 8760 hours.   D-Dimer No results for input(s): DDIMER in the last 72 hours. Hemoglobin A1C No results for input(s): HGBA1C in the last 72 hours. Fasting Lipid Panel No results for input(s): CHOL, HDL, LDLCALC, TRIG, CHOLHDL, LDLDIRECT in the last 72 hours. Thyroid Function Tests No results for input(s): TSH, T4TOTAL, T3FREE, THYROIDAB in the last 72 hours.  Invalid input(s): FREET3  Other results:  Imaging   No results found.  Medications:    Scheduled Medications: . apixaban  5 mg Oral BID  . digoxin  0.125 mg Oral Daily  . furosemide  40 mg Intravenous BID  . loratadine  10 mg Oral Daily  . losartan  12.5 mg Oral Daily  . mometasone-formoterol  2 puff Inhalation BID  . potassium chloride  40 mEq Oral TID  . sodium chloride flush  10-40 mL Intracatheter Q12H  . sodium chloride flush  3 mL Intravenous Q12H  . sodium chloride flush  3 mL Intravenous Q12H  . spironolactone  12.5 mg Oral Daily  . tiotropium  18 mcg Inhalation Daily    Infusions: . sodium chloride    . sodium chloride Stopped (04/05/17 1430)  . sodium chloride    . amiodarone 30 mg/hr (04/08/17 0009)      PRN Medications: acetaminophen, albuterol, ALPRAZolam, calcium carbonate, ondansetron (ZOFRAN) IV, sodium chloride flush, sodium chloride flush, sodium chloride flush, traZODone  Patient Profile   Reginald Hahn is a 66 y.o. male with h/o COPD admitted in 6/17 with acute systolic HF and AF with RVR in the setting of likely viral illness.   Assessment/Plan   1.  PAF with RVR: EF down 45-50% -> 15% in setting of recurrent AF with RVR. Intially refused AC due to previous life-threatening RP bleed on heparin. Did agree to Eliquis. Underwent  TEE/DC-CV on 12/27. Failed cardioversion initially but converted overnight.  Went back into atrial fibrillation early am 12/29.  Re-bolused amiodarone and stopped milrinone, back in NSR again this morning.  - Continue Eliquis.  - Will continue amiodarone gtt today and convert to po tomorrow if remains in NSR.  Long-term, would likely benefit from atrial fibrillation ablation.  2. Acute on Chronic Systolic HF -> cardiogenic shock: NICM. R/LHC 08/2016 with minimal non-obstructive CAD.  Echo 03/28/17 EF 20-25%, Trivial AI, Mild RAE, Pa peak pressure 35 mm Hg. Worsened from 05/2016 where EF was 45-50%. 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 He is now off milrinone, co-ox adequate at 55%.  He is more volume overloaded now with CVP 14 this morning, some dyspnea. - Will stay off milrinone.   - Continue digoxin 0.125 daily.  - Lasix 40 mg IV bid today, follow I/Os and weights.  - Continue spironolactone 12.5 daily.  - SBP 100s, will start low dose losartan 12.5 mg daily. - If EF does not improve in NSR, would likely benefit from CRT with LBBB 150 msec.   3. h/o RP bleed: Have avoided heparin IV/SQ. (Pt also adamant to not receive this). On SCDs. See above discussion about Eliquis. Tolerating well without recurrent bleed.  4. AECOPD: Resolved. Now off nebs and prednisone. Finished 7 days of doxy. No wheezing on exam.  5. AKI: Due to ATN/shock.  - Resolved. Creatinine stable  at 1.32. 6.  Hypokalemia: Resolved.  7. DVT Prophylaxis: Ted hose + SCDs.  Now on Eliquis. No change 8. Anxiety: continue Xanax 0.25 tid prn. Stable.    Walk with CR.   Length of Stay: 13  Marca Ancona, MD  10:32 AM   Advanced Heart Failure Team Pager [phone] (M-F; 7a - 4p)  Please contact [facility] for night-coverage after hours (4p -7a ) and weekends on [email or web address]

## 2017-04-09 LAB — BASIC METABOLIC PANEL
ANION GAP: 7 (ref 5–15)
BUN: 26 mg/dL — ABNORMAL HIGH (ref 6–20)
CO2: 29 mmol/L (ref 22–32)
Calcium: 8.4 mg/dL — ABNORMAL LOW (ref 8.9–10.3)
Chloride: 102 mmol/L (ref 101–111)
Creatinine, Ser: 1.45 mg/dL — ABNORMAL HIGH (ref 0.61–1.24)
GFR calc non Af Amer: 49 mL/min — ABNORMAL LOW (ref >60)
GFR, EST AFRICAN AMERICAN: 57 mL/min — AB (ref >60)
GLUCOSE: 85 mg/dL (ref 65–99)
POTASSIUM: 5 mmol/L (ref 3.5–5.1)
Sodium: 138 mmol/L (ref 135–145)

## 2017-04-09 LAB — CBC WITH DIFFERENTIAL/PLATELET
BASOS ABS: 0 10*3/uL (ref 0.0–0.1)
BASOS PCT: 0 %
Eosinophils Absolute: 0.5 10*3/uL (ref 0.0–0.7)
Eosinophils Relative: 6 %
HEMATOCRIT: 44.7 % (ref 39.0–52.0)
HEMOGLOBIN: 14.1 g/dL (ref 13.0–17.0)
LYMPHS PCT: 23 %
Lymphs Abs: 2 10*3/uL (ref 0.7–4.0)
MCH: 29.4 pg (ref 26.0–34.0)
MCHC: 31.5 g/dL (ref 30.0–36.0)
MCV: 93.1 fL (ref 78.0–100.0)
MONO ABS: 0.6 10*3/uL (ref 0.1–1.0)
Monocytes Relative: 7 %
NEUTROS ABS: 5.6 10*3/uL (ref 1.7–7.7)
NEUTROS PCT: 64 %
Platelets: 326 10*3/uL (ref 150–400)
RBC: 4.8 MIL/uL (ref 4.22–5.81)
RDW: 15.1 % (ref 11.5–15.5)
WBC: 8.8 10*3/uL (ref 4.0–10.5)

## 2017-04-09 LAB — COOXEMETRY PANEL
Carboxyhemoglobin: 1.4 % (ref 0.5–1.5)
METHEMOGLOBIN: 0.7 % (ref 0.0–1.5)
O2 Saturation: 55.2 %
Total hemoglobin: 14.5 g/dL (ref 12.0–16.0)

## 2017-04-09 MED ORDER — POTASSIUM CHLORIDE CRYS ER 20 MEQ PO TBCR
40.0000 meq | EXTENDED_RELEASE_TABLET | Freq: Once | ORAL | Status: AC
  Administered 2017-04-09: 40 meq via ORAL
  Filled 2017-04-09: qty 2

## 2017-04-09 NOTE — Evaluation (Signed)
Physical Therapy Evaluation Patient Details Name: Reginald Hahn MRN: [id number] DOB: [birth year] Today's Date: 04/09/2017   History of Present Illness  Pt admitted with weakness, cough, abdominal cramping, +afib with RVR, CHF, cardiogenic shock. PMH: COPD  Clinical Impression  Patient received sitting at EOB with nursing staff present, RN reports patient doing very well and he has already taken 2 walks with nursing staff already today. Patient able to complete all functional mobility, transfers, and gait with S to Mod(I) this session; note patient able to continue conversation and perform head turns while ambulating approximately 352f with no device, no significant loss of balance noted during gait period. Patient does not appear to be in need of skilled PT services at this point, however he may benefit from follow-up with cardiac rehab moving forward. Patient left in bed with all needs met and questions/concerns addressed this afternoon. PT signing off, thank you for the referral.     Follow Up Recommendations No PT follow up;Other (comment)(patient may benefit from F/U with cardiac rehab )    Equipment Recommendations  None recommended by PT;Other (comment)(patient appears to already own all necessary DME )    Recommendations for Other Services       Precautions / Restrictions Precautions Precautions: None Restrictions Weight Bearing Restrictions: No      Mobility  Bed Mobility Overal bed mobility: Modified Independent             General bed mobility comments: HOB up  Transfers Overall transfer level: Modified independent Equipment used: None Transfers: Sit to/from Stand Sit to Stand: Modified independent (Device/Increase time)         General transfer comment: for safety, slightly off balance upon initially standing  Ambulation/Gait Ambulation/Gait assistance: Supervision Ambulation Distance (Feet): 300 Feet Assistive device: None Gait Pattern/deviations:  WFL(Within Functional Limits)     General Gait Details: patient able to maintain conversation during gait, able to turn head R/L without significant unsteadiness or loss of balance   Stairs            Wheelchair Mobility    Modified Rankin (Stroke Patients Only)       Balance Overall balance assessment: No apparent balance deficits (not formally assessed)                                           Pertinent Vitals/Pain Pain Assessment: No/denies pain    Home Living Family/patient expects to be discharged to:: Private residence Living Arrangements: Alone Available Help at Discharge: Friend(s);Available 24 hours/day(ex wife until Saturday ) Type of Home: Apartment Home Access: Level entry     Home Layout: One level Home Equipment: Other (comment);Walker - 2 wheels;Cane - single point;Shower seat(oxygen )      Prior Function Level of Independence: Independent         Comments: drives, takes walks at [employer], was eating out a lot     Hand Dominance   Dominant Hand: Right    Extremity/Trunk Assessment   Upper Extremity Assessment Upper Extremity Assessment: Defer to OT evaluation    Lower Extremity Assessment Lower Extremity Assessment: Overall WFL for tasks assessed    Cervical / Trunk Assessment Cervical / Trunk Assessment: Normal  Communication   Communication: HOH  Cognition Arousal/Alertness: Awake/alert Behavior During Therapy: WFL for tasks assessed/performed Overall Cognitive Status: Within Functional Limits for tasks assessed  General Comments      Exercises     Assessment/Plan    PT Assessment Patent does not need any further PT services  PT Problem List Decreased safety awareness;Decreased coordination       PT Treatment Interventions      PT Goals (Current goals can be found in the Care Plan section)  Acute Rehab PT Goals Patient  Stated Goal: to go home PT Goal Formulation: With patient Time For Goal Achievement: 04/14/17 Potential to Achieve Goals: Good    Frequency     Barriers to discharge        Co-evaluation               AM-PAC PT "6 Clicks" Daily Activity  Outcome Measure Difficulty turning over in bed (including adjusting bedclothes, sheets and blankets)?: None Difficulty moving from lying on back to sitting on the side of the bed? : None Difficulty sitting down on and standing up from a chair with arms (e.g., wheelchair, bedside commode, etc,.)?: None Help needed moving to and from a bed to chair (including a wheelchair)?: None Help needed walking in hospital room?: None Help needed climbing 3-5 steps with a railing? : None 6 Click Score: 24    End of Session   Activity Tolerance: Patient tolerated treatment well Patient left: in bed;with call bell/phone within reach Nurse Communication: Mobility status PT Visit Diagnosis: Other abnormalities of gait and mobility (R26.89)    Time: 1440-1500 PT Time Calculation (min) (ACUTE ONLY): 20 min   Charges:   PT Evaluation $PT Eval Moderate Complexity: 1 Mod     PT G Codes:   PT G-Codes **NOT FOR INPATIENT CLASS** Functional Assessment Tool Used: AM-PAC 6 Clicks Basic Mobility;Clinical judgement Functional Limitation: Mobility: Walking and moving around Mobility: Walking and Moving Around Current Status (T0240): At least 1 percent but less than 20 percent impaired, limited or restricted Mobility: Walking and Moving Around Goal Status [phone]): At least 1 percent but less than 20 percent impaired, limited or restricted Mobility: Walking and Moving Around Discharge Status [phone]): At least 1 percent but less than 20 percent impaired, limited or restricted   Deniece Ree PT, DPT, CBIS  Supplemental Physical Therapist [facility]

## 2017-04-09 NOTE — Progress Notes (Signed)
CARDIAC REHAB PHASE I   PRE:  Rate/Rhythm: 72 SR PACs  BP:  Supine:   Sitting: 103/75  Standing:    SaO2: 94%RA  MODE:  Ambulation: 400 ft   POST:  Rate/Rhythm: 83 SR  BP:  Supine:   Sitting: 92/73  Standing:    SaO2: 96%RA 1417-1442 Pt has walked once already. Walked 400 ft pushing IV pole and tolerated well. To sitting on side of bed after walk.    Luetta Nutting, RN BSN  04/09/2017 2:39 PM

## 2017-04-09 NOTE — Progress Notes (Addendum)
Patient ID: Reginald Hahn, male   DOB: [birth year], 66 y.o.   MRN: [id number]     Advanced Heart Failure Rounding Note  Primary Cardiologist:  Dr. Gala Romney   Subjective:    Underwent TEE/DCCV 12/27, but did not go back into NSR despite 2 shocks however converted overnight to NSR. Went back into atrial fibrillation am 12/29.  Milrinone stopped 12/29 and amiodarone re-bolused.   Remains in NSR. Coox 55.2% off milrinone. Negative 1.3L and down 3 lbs. Creatinine 1.3 -> 1.4  CVP 11-12.   Feeling slightly better. Anxious to go home. SOB improving, had one episode overnight.   TEE 12/27 EF 10-15% moderate to severe RV dysfunction LA 5.2 cm  ECG (12/30): NSR, 1st degree AVB, LBBB QRS 150 msec   Objective:   Weight Range: 194 lb 1.6 oz (88 kg) Body mass index is 26.32 kg/m.   Vital Signs:   Temp:  [97.5 F (36.4 C)-98.4 F (36.9 C)] 97.9 F (36.6 C) (12/31 0818) Pulse Rate:  [63-98] 63 (12/31 0818) Resp:  [19-27] 22 (12/31 0818) BP: (94-113)/(67-88) 99/71 (12/31 0818) SpO2:  [93 %-98 %] 98 % (12/31 0818) Weight:  [194 lb 1.6 oz (88 kg)] 194 lb 1.6 oz (88 kg) (12/31 0415) Last BM Date: 04/08/17 Weight change: Filed Weights   04/07/17 0500 04/08/17 0453 04/09/17 0415  Weight: 196 lb 12.8 oz (89.3 kg) 197 lb 4.8 oz (89.5 kg) 194 lb 1.6 oz (88 kg)   Intake/Output:   Intake/Output Summary (Last 24 hours) at 04/09/2017 0825 Last data filed at 04/09/2017 0041 Gross per 24 hour  Intake 660.4 ml  Output 2501 ml  Net -1840.6 ml    Physical Exam   General: NAD. HEENT: Normal Neck: Supple. JVP ~11. Carotids 2+ bilat; no bruits. No thyromegaly or nodule noted. Cor: PMI nondisplaced. RRR, No M/G/R noted Lungs: CTAB, normal effort. Abdomen: Soft, non-tender, non-distended, no HSM. No bruits or masses. +BS  Extremities: No cyanosis, clubbing, or rash. R and LLE no edema.  Neuro: Alert & orientedx3, cranial nerves grossly intact. moves all 4 extremities w/o difficulty. Affect  pleasant    Telemetry   NSR with 1st degree AVB, Personally reviewed. Was in Afib from ~1200 04/08/17 to approx 0400 this am.   Labs    CBC Recent Labs    04/09/17 0425  WBC 8.8  NEUTROABS 5.6  HGB 14.1  HCT 44.7  MCV 93.1  PLT 326   Basic Metabolic Panel Recent Labs    02/63/78 0440 04/09/17 0425  NA 133* 138  K 4.4 5.0  CL 95* 102  CO2 29 29  GLUCOSE 89 85  BUN 23* 26*  CREATININE 1.32* 1.45*  CALCIUM 8.6* 8.4*   Liver Function Tests No results for input(s): AST, ALT, ALKPHOS, BILITOT, PROT, ALBUMIN in the last 72 hours. No results for input(s): LIPASE, AMYLASE in the last 72 hours. Cardiac Enzymes No results for input(s): CKTOTAL, CKMB, CKMBINDEX, TROPONINI in the last 72 hours.  BNP: BNP (last 3 results) No results for input(s): BNP in the last 8760 hours.  ProBNP (last 3 results) No results for input(s): PROBNP in the last 8760 hours.   D-Dimer No results for input(s): DDIMER in the last 72 hours. Hemoglobin A1C No results for input(s): HGBA1C in the last 72 hours. Fasting Lipid Panel No results for input(s): CHOL, HDL, LDLCALC, TRIG, CHOLHDL, LDLDIRECT in the last 72 hours. Thyroid Function Tests No results for input(s): TSH, T4TOTAL, T3FREE, THYROIDAB in the last 72 hours.  Invalid input(s): FREET3  Other results:   Imaging   No results found.  Medications:    Scheduled Medications: . apixaban  5 mg Oral BID  . digoxin  0.125 mg Oral Daily  . furosemide  40 mg Intravenous BID  . loratadine  10 mg Oral Daily  . losartan  12.5 mg Oral Daily  . mometasone-formoterol  2 puff Inhalation BID  . potassium chloride  40 mEq Oral TID  . sodium chloride flush  10-40 mL Intracatheter Q12H  . sodium chloride flush  3 mL Intravenous Q12H  . sodium chloride flush  3 mL Intravenous Q12H  . spironolactone  12.5 mg Oral Daily  . tiotropium  18 mcg Inhalation Daily    Infusions: . sodium chloride    . sodium chloride Stopped (04/05/17 1430)  .  sodium chloride    . amiodarone 30 mg/hr (04/08/17 1350)    PRN Medications: acetaminophen, albuterol, ALPRAZolam, calcium carbonate, ondansetron (ZOFRAN) IV, sodium chloride flush, sodium chloride flush, sodium chloride flush, traZODone  Patient Profile   Reginald Hahn is a 66 y.o. male with h/o COPD admitted in 6/17 with acute systolic HF and AF with RVR in the setting of likely viral illness.   Assessment/Plan   1.  PAF with RVR: EF down 45-50% -> 15% in setting of recurrent AF with RVR. Intially refused AC due to previous life-threatening RP bleed on heparin. Did agree to Eliquis. Underwent  TEE/DC-CV on 12/27. Failed cardioversion initially but converted overnight.  Went back into atrial fibrillation early am 12/29.  Re-bolused amiodarone and stopped milrinone. - Went back into Afib yesterday am, and back into NSR again this am.  - Continue Eliquis.  - Will continue amiodarone gtt today with PAF again overnight. Long-term, would likely benefit from atrial fibrillation ablation.  2. Acute on Chronic Systolic HF -> cardiogenic shock: NICM. R/LHC 08/2016 with minimal non-obstructive CAD.  Echo 03/28/17 EF 20-25%, Trivial AI, Mild RAE, Pa peak pressure 35 mm Hg. Worsened from 05/2016 where EF was 45-50%. 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 He is now off milrinone, co-ox adequate at 55%.  He is more volume overloaded now with CVP 14 this morning, some dyspnea. - Will stay off milrinone.   - Continue digoxin 0.125 daily.  - Continue Lasix 40 mg IV bid today, follow creatinine closely.  - Continue spironolactone 12.5 daily.  - No BP room to titrate losartan, continue low dose.  - If EF does not improve in NSR, would likely benefit from CRT with LBBB 150 msec.   3. h/o RP bleed: Have avoided heparin IV/SQ. (Pt also adamant to not receive this). On SCDs. See above discussion about Eliquis. Tolerating well without recurrent bleed.  4. AECOPD: Resolved. Now off nebs and prednisone. Finished 7 days of  doxy. No wheezing on exam.  5. AKI: Due to ATN/shock.  - Resolved. Creatinine mildly elevated at 1.4.  6.  Hypokalemia: Resolved. K 5 today, will only give K once in evening.  7. DVT Prophylaxis: - Ted hose + SCDs.  Now on Eliquis. No change.  8. Anxiety: - Continue Xanax 0.25 tid prn. Stable.   Continue to walk with CR.   Length of Stay: [address]  Graciella Freer, [place]  8:25 AM   Advanced Heart Failure Team Pager [phone] (M-F; 7a - 4p)  Please contact [facility] for night-coverage after hours (4p -7a ) and weekends on [email or web address]  Patient seen with PA, agree with the above note.  He is stable off milrinone  with co-ox 55%.  CVP remains mildly elevated at 11-12 with some volume overload on exam.  Will plan on 1 more day of IV Lasix then to po tomorrow.  No BP room to titrate losartan, watch creatinine (slight increase).   He was in atrial fibrillation during the afternoon/evening yesterday again yesterday but back in NSR this morning.  Will continue IV amiodarone one more day then to po tomorrow.   Probably home tomorrow afternoon.   Marca AnconaDalton McLean 04/09/2017 8:47 AM

## 2017-04-09 NOTE — Plan of Care (Signed)
  Clinical Measurements: Ability to maintain clinical measurements within normal limits will improve. Pt converted from A.fib to SR with 1st degree heart block. On call cardiologist notified. Plan is to continue amiodarone until further notice. VS stable. Pt has no other needs or concerns at this time. Will continue to monitor and assess.   04/09/2017 0505 - Progressing by Mellody Drown, RN

## 2017-04-09 NOTE — Evaluation (Signed)
Occupational Therapy Evaluation and Discharge Patient Details Name: Reginald Hahn MRN: [id number] DOB: [birth year] Today's Date: 04/09/2017    History of Present Illness Pt admitted with weakness, cough, abdominal cramping, +afib with RVR, CHF, cardiogenic shock. PMH: COPD   Clinical Impression   Pt was independent prior to admission. Presents with mild unsteadiness upon initially standing requiring supervision for safety with ADL. Pt educated in energy conservation and recommended use of his shower seat at home. Pt plans to d/c to his apartment with 24 hour assist of his ex-wife until Saturday. No further OT needs.    Follow Up Recommendations  No OT follow up    Equipment Recommendations  None recommended by OT    Recommendations for Other Services       Precautions / Restrictions Precautions Precautions: None Restrictions Weight Bearing Restrictions: No      Mobility Bed Mobility Overal bed mobility: Modified Independent             General bed mobility comments: HOB up  Transfers Overall transfer level: Needs assistance Equipment used: None Transfers: Sit to/from Stand Sit to Stand: Supervision         General transfer comment: for safety, slightly off balance upon initially standing    Balance                                           ADL either performed or assessed with clinical judgement   ADL Overall ADL's : Needs assistance/impaired Eating/Feeding: Independent;Bed level   Grooming: Wash/dry hands;Supervision/safety;Standing   Upper Body Bathing: Set up;Sitting   Lower Body Bathing: Supervison/ safety;Sit to/from stand   Upper Body Dressing : Set up;Sitting   Lower Body Dressing: Supervision/safety;Sit to/from stand   Toilet Transfer: Supervision/safety;Ambulation(pushed IV pole)   Toileting- Clothing Manipulation and Hygiene: Supervision/safety(standing)       Functional mobility during ADLs:  Supervision/safety(pushing IV pole) General ADL Comments: Educated pt in energy conservation strategies and recommended use of shower seat intially.     Vision Baseline Vision/History: Wears glasses Wears Glasses: At all times Patient Visual Report: No change from baseline       Perception     Praxis      Pertinent Vitals/Pain Pain Assessment: No/denies pain     Hand Dominance Right   Extremity/Trunk Assessment Upper Extremity Assessment Upper Extremity Assessment: Overall WFL for tasks assessed   Lower Extremity Assessment Lower Extremity Assessment: Defer to PT evaluation       Communication Communication Communication: HOH   Cognition Arousal/Alertness: Awake/alert Behavior During Therapy: WFL for tasks assessed/performed Overall Cognitive Status: Within Functional Limits for tasks assessed                                     General Comments       Exercises     Shoulder Instructions      Home Living Family/patient expects to be discharged to:: Private residence Living Arrangements: Alone Available Help at Discharge: Friend(s);Available 24 hours/day(ex wife until Saturday) Type of Home: Apartment Home Access: Level entry     Home Layout: One level     Bathroom Shower/Tub: Chief Strategy Officer: Handicapped height     Home Equipment: Other (comment);Walker - 2 wheels;Cane - single point;Shower seat(oxygen)  Prior Functioning/Environment Level of Independence: Independent        Comments[facility] drives[facility], was eating out a lot        OT Problem List:        OT Treatment/Interventions:      OT Goals(Current goals can be found in the care plan section) Acute Rehab OT Goals Patient Stated Goal: to go home  OT Frequency:     Barriers to D/C:            Co-evaluation              AM-PAC PT "6 Clicks" Daily Activity     Outcome Measure Help from another  person eating meals?: None Help from another person taking care of personal grooming?: A Little Help from another person toileting, which includes using toliet, bedpan, or urinal?: A Little Help from another person bathing (including washing, rinsing, drying)?: A Little Help from another person to put on and taking off regular upper body clothing?: None Help from another person to put on and taking off regular lower body clothing?: A Little 6 Click Score: 20   End of Session Equipment Utilized During Treatment: Gait belt  Activity Tolerance: Patient tolerated treatment well(02 sats 97% on RA) Patient left: in bed;with call bell/phone within reach  OT Visit Diagnosis: Unsteadiness on feet (R26.81)                Time: [id number] OT Time Calculation (min): 32 min Charges:  OT General Charges $OT Visit: 1 Visit OT Evaluation $OT Eval Moderate Complexity: 1 Mod OT Treatments $Self Care/Home Management : 8-22 mins G-Codes:     Evern BioMayberry, Millie Shorb Lynn 04/09/2017, 2:24 PM  04/09/2017 Martie RoundJulie Khyra Viscuso, OTR/L Pager: [phone]

## 2017-04-10 ENCOUNTER — Encounter (HOSPITAL_COMMUNITY): Payer: Self-pay | Admitting: Physician Assistant

## 2017-04-10 DIAGNOSIS — F419 Anxiety disorder, unspecified: Secondary | ICD-10-CM

## 2017-04-10 DIAGNOSIS — E876 Hypokalemia: Secondary | ICD-10-CM

## 2017-04-10 LAB — COOXEMETRY PANEL
Carboxyhemoglobin: 1.3 % (ref 0.5–1.5)
METHEMOGLOBIN: 0.7 % (ref 0.0–1.5)
O2 SAT: 63.6 %
Total hemoglobin: 14.3 g/dL (ref 12.0–16.0)

## 2017-04-10 LAB — BASIC METABOLIC PANEL
Anion gap: 9 (ref 5–15)
BUN: 25 mg/dL — ABNORMAL HIGH (ref 6–20)
CALCIUM: 8.4 mg/dL — AB (ref 8.9–10.3)
CO2: 29 mmol/L (ref 22–32)
CREATININE: 1.55 mg/dL — AB (ref 0.61–1.24)
Chloride: 100 mmol/L — ABNORMAL LOW (ref 101–111)
GFR, EST AFRICAN AMERICAN: 53 mL/min — AB (ref >60)
GFR, EST NON AFRICAN AMERICAN: 45 mL/min — AB (ref >60)
Glucose, Bld: 76 mg/dL (ref 65–99)
Potassium: 4 mmol/L (ref 3.5–5.1)
SODIUM: 138 mmol/L (ref 135–145)

## 2017-04-10 LAB — CBC WITH DIFFERENTIAL/PLATELET
BASOS PCT: 1 %
Basophils Absolute: 0.1 10*3/uL (ref 0.0–0.1)
EOS ABS: 0.5 10*3/uL (ref 0.0–0.7)
Eosinophils Relative: 7 %
HCT: 44.3 % (ref 39.0–52.0)
HEMOGLOBIN: 13.8 g/dL (ref 13.0–17.0)
Lymphocytes Relative: 22 %
Lymphs Abs: 1.8 10*3/uL (ref 0.7–4.0)
MCH: 29.2 pg (ref 26.0–34.0)
MCHC: 31.2 g/dL (ref 30.0–36.0)
MCV: 93.9 fL (ref 78.0–100.0)
Monocytes Absolute: 0.6 10*3/uL (ref 0.1–1.0)
Monocytes Relative: 7 %
NEUTROS PCT: 63 %
Neutro Abs: 5.3 10*3/uL (ref 1.7–7.7)
PLATELETS: 311 10*3/uL (ref 150–400)
RBC: 4.72 MIL/uL (ref 4.22–5.81)
RDW: 15.2 % (ref 11.5–15.5)
WBC: 8.4 10*3/uL (ref 4.0–10.5)

## 2017-04-10 LAB — MAGNESIUM: MAGNESIUM: 2.1 mg/dL (ref 1.7–2.4)

## 2017-04-10 MED ORDER — FUROSEMIDE 40 MG PO TABS
40.0000 mg | ORAL_TABLET | Freq: Every day | ORAL | Status: DC
  Administered 2017-04-10: 40 mg via ORAL
  Filled 2017-04-10 (×2): qty 1

## 2017-04-10 MED ORDER — FUROSEMIDE 40 MG PO TABS
40.0000 mg | ORAL_TABLET | Freq: Every day | ORAL | 11 refills | Status: DC
Start: 2017-04-11 — End: 2017-05-18

## 2017-04-10 MED ORDER — SPIRONOLACTONE 25 MG PO TABS: 12.5000 mg | ORAL_TABLET | Freq: Every day | ORAL | 11 refills | Status: DC

## 2017-04-10 MED ORDER — AMIODARONE HCL 200 MG PO TABS: ORAL_TABLET | ORAL | 11 refills | Status: DC

## 2017-04-10 MED ORDER — AMIODARONE HCL 200 MG PO TABS
400.0000 mg | ORAL_TABLET | Freq: Two times a day (BID) | ORAL | Status: DC
Start: 2017-04-10 — End: 2017-04-10
  Administered 2017-04-10: 400 mg via ORAL
  Filled 2017-04-10 (×2): qty 2

## 2017-04-10 MED ORDER — LOSARTAN POTASSIUM 25 MG PO TABS: 12.5000 mg | ORAL_TABLET | Freq: Every day | ORAL | 11 refills | Status: DC

## 2017-04-10 MED ORDER — APIXABAN 5 MG PO TABS: 5.0000 mg | ORAL_TABLET | Freq: Two times a day (BID) | ORAL | 11 refills | Status: DC

## 2017-04-10 MED ORDER — DIGOXIN 125 MCG PO TABS: 0.1250 mg | ORAL_TABLET | Freq: Every day | ORAL | 11 refills | Status: DC

## 2017-04-10 MED ORDER — POTASSIUM CHLORIDE CRYS ER 20 MEQ PO TBCR: 20.0000 meq | EXTENDED_RELEASE_TABLET | Freq: Every day | ORAL | 11 refills | Status: DC

## 2017-04-10 NOTE — Progress Notes (Signed)
Patient ID: Reginald Hahn, male   DOB: [birth year], 67 y.o.   MRN: [id number]     Advanced Heart Failure Rounding Note  Primary Cardiologist:  Dr. Gala Romney   Subjective:    Underwent TEE/DCCV 12/27, but did not go back into NSR despite 2 shocks however converted overnight to NSR. Went back into atrial fibrillation am 12/29.  Milrinone stopped 12/29 and amiodarone re-bolused.   Patient continues to go in and out of atrial fibrillation.  He was in NSR most of the day yesterday but back in rate-controlled atrial fibrillation overnight. Coox 64% off milrinone. Weight down 2 lbs again. Creatinine 1.3 -> 1.4 -> 1.55.   Breathing much improved, anxious to go home.   TEE 12/27 EF 10-15% moderate to severe RV dysfunction LA 5.2 cm  ECG (12/30): NSR, 1st degree AVB, LBBB QRS 150 msec   Objective:   Weight Range: 192 lb 3.2 oz (87.2 kg) Body mass index is 26.07 kg/m.   Vital Signs:   Temp:  [97.5 F (36.4 C)-98.4 F (36.9 C)] 98.2 F (36.8 C) (01/01 0518) Pulse Rate:  [36-82] 82 (01/01 0905) Resp:  [16-26] 17 (01/01 0905) BP: (108-115)/(56-74) 113/74 (01/01 0518) SpO2:  [95 %-97 %] 96 % (01/01 0905) Weight:  [192 lb 3.2 oz (87.2 kg)] 192 lb 3.2 oz (87.2 kg) (01/01 0518) Last BM Date: 04/09/17 Weight change: Filed Weights   04/08/17 0453 04/09/17 0415 04/10/17 0518  Weight: 197 lb 4.8 oz (89.5 kg) 194 lb 1.6 oz (88 kg) 192 lb 3.2 oz (87.2 kg)   Intake/Output:   Intake/Output Summary (Last 24 hours) at 04/10/2017 0951 Last data filed at 04/10/2017 7829 Gross per 24 hour  Intake 1067.17 ml  Output 1350 ml  Net -282.83 ml    Physical Exam   General: NAD Neck: JVP 8 cm, no thyromegaly or thyroid nodule.  Lungs: Clear to auscultation bilaterally with normal respiratory effort. CV: Nondisplaced PMI.  Heart irregular S1/S2, no S3/S4, no murmur.  No peripheral edema.   Abdomen: Soft, nontender, no hepatosplenomegaly, no distention.  Skin: Intact without lesions or rashes.    Neurologic: Alert and oriented x 3.  Psych: Normal affect. Extremities: No clubbing or cyanosis.  HEENT: Normal.     Telemetry   NSR => atrial fibrillation in 90s (personally reviewed)  Labs    CBC Recent Labs    04/09/17 0425 04/10/17 0542  WBC 8.8 8.4  NEUTROABS 5.6 5.3  HGB 14.1 13.8  HCT 44.7 44.3  MCV 93.1 93.9  PLT 326 311   Basic Metabolic Panel Recent Labs    56/21/30 0425 04/10/17 0542  NA 138 138  K 5.0 4.0  CL 102 100*  CO2 29 29  GLUCOSE 85 76  BUN 26* 25*  CREATININE 1.45* 1.55*  CALCIUM 8.4* 8.4*  MG  --  2.1   Liver Function Tests No results for input(s): AST, ALT, ALKPHOS, BILITOT, PROT, ALBUMIN in the last 72 hours. No results for input(s): LIPASE, AMYLASE in the last 72 hours. Cardiac Enzymes No results for input(s): CKTOTAL, CKMB, CKMBINDEX, TROPONINI in the last 72 hours.  BNP: BNP (last 3 results) No results for input(s): BNP in the last 8760 hours.  ProBNP (last 3 results) No results for input(s): PROBNP in the last 8760 hours.   D-Dimer No results for input(s): DDIMER in the last 72 hours. Hemoglobin A1C No results for input(s): HGBA1C in the last 72 hours. Fasting Lipid Panel No results for input(s): CHOL, HDL, LDLCALC, TRIG, CHOLHDL,  LDLDIRECT in the last 72 hours. Thyroid Function Tests No results for input(s): TSH, T4TOTAL, T3FREE, THYROIDAB in the last 72 hours.  Invalid input(s): FREET3  Other results:   Imaging   No results found.  Medications:    Scheduled Medications: . amiodarone  400 mg Oral BID  . apixaban  5 mg Oral BID  . digoxin  0.125 mg Oral Daily  . furosemide  40 mg Oral Daily  . loratadine  10 mg Oral Daily  . losartan  12.5 mg Oral Daily  . mometasone-formoterol  2 puff Inhalation BID  . sodium chloride flush  10-40 mL Intracatheter Q12H  . sodium chloride flush  3 mL Intravenous Q12H  . sodium chloride flush  3 mL Intravenous Q12H  . spironolactone  12.5 mg Oral Daily  . tiotropium  18  mcg Inhalation Daily    Infusions: . sodium chloride    . sodium chloride Stopped (04/05/17 1430)  . sodium chloride      PRN Medications: acetaminophen, albuterol, ALPRAZolam, calcium carbonate, ondansetron (ZOFRAN) IV, sodium chloride flush, sodium chloride flush, sodium chloride flush, traZODone  Patient Profile   Reginald Hahn is a 67 y.o. male with h/o COPD admitted in 6/17 with acute systolic HF and AF with RVR in the setting of likely viral illness.   Assessment/Plan   1.  PAF with RVR: EF down 45-50% -> 15% in setting of recurrent AF with RVR. Intially refused AC due to previous life-threatening RP bleed on heparin. Did agree to Eliquis. Underwent  TEE/DC-CV on 12/27. Failed cardioversion initially but converted overnight.  Went back into atrial fibrillation early am 12/29.  Re-bolused amiodarone and stopped milrinone. Since then, he has been in and out of atrial fibrillation. He is in atrial fibrillation this morning with controlled rate (90s).  - Continue Eliquis.  - Transition to amiodarone 400 mg bid.  With QTc 536 msec on last ECG, I will not add on ranolazine.  At this time, he is rate-controlled and I suspect he will continue to go in and out of atrial fibrillation.  Hopefully, over time and with further amiodarone loading, he will stay in NSR preferentially. Long-term, would likely benefit from atrial fibrillation ablation.  2. Acute on Chronic Systolic HF -> cardiogenic shock: NICM. R/LHC 08/2016 with minimal non-obstructive CAD.  Echo 03/28/17 EF 20-25%, Trivial AI, Mild RAE, Pa peak pressure 35 mm Hg. Worsened from 05/2016 where EF was 45-50%. 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 He is now off milrinone, co-ox adequate at 64%.  Weight is down again with IV diuresis.  Creatinine up to 1.55.  He looks only mildly volume overloaded now. - Continue digoxin 0.125 daily, will need to check level in a few more days.  - No more IV Lasix, start Lasix 40 mg po daily today.   - Continue  spironolactone 12.5 daily.  - Continue losartan 12.5 mg daily.   - If EF does not improve with control of atrial fibrillation, would likely benefit from CRT with LBBB 150 msec.   3. h/o RP bleed: Have avoided heparin IV/SQ. (Pt also adamant to not receive this). On SCDs. See above discussion about Eliquis. Tolerating well without recurrent bleed.  4. AECOPD: Resolved. Now off nebs and prednisone. Finished 7 days of doxy. No wheezing on exam.  5. AKI: Due to ATN/shock.  - Resolved. Creatinine mildly elevated at 1.55.  6.  Hypokalemia: Will give KCl 20 daily for home.   7. DVT Prophylaxis: - Ted hose + SCDs.  Now  on Eliquis. No change.  8. Anxiety: - Continue Xanax 0.25 tid prn. Stable.  9. Disposition: I think he can go home today.  Will need close followup in CHF clinic.  He will need BMET and digoxin level drawn Friday ideally.  He will need appt with EP to consider atrial fibrillation ablation.  Meds for home: Amiodarone 400 mg bid x 5 days then 200 mg bid x 7 days then 200 mg daily after that, apixaban 5 mg bid, Lasix 40 mg daily, KCl 20 daily, digoxin 0.125 daily, losartan 12.5 daily, spironolactone 12.5 daily.    Length of Stay: 15  Marca Ancona, MD  9:51 AM   Advanced Heart Failure Team Pager [phone] (M-F; 7a - 4p)  Please contact [facility] for night-coverage after hours (4p -7a ) and weekends on [email or web address]

## 2017-04-10 NOTE — Discharge Summary (Signed)
Discharge Summary    Patient ID: Reginald Hahn,  MRN: [id number], DOB/AGE: 66-Sep-1952 66 y.o.  Admit date: 03/26/2017 Discharge date: 04/10/2017  Primary Care Provider: Ginger Carne Primary Cardiologist: Arvilla Meres, MD   Discharge Diagnoses    Principal Problem:   Atrial fibrillation with RVR [facility]) Active Problems:   Acute on chronic systolic heart failure (HCC)   AKI (acute kidney injury) (HCC)   Left bundle branch block   COPD, severe (HCC)   Hypokalemia   Anxiety   Allergies Allergies  Allergen Reactions  . Heparin Anaphylaxis and Hives    Per patient   . Codeine Itching    Whelps     PATIENT DENIES ALLERGY  TO CODEINE  . Other     Seasonal allergies, "all spices, salt, pepper"    Diagnostic Studies/Procedures    Echocardiogram 03/28/17 - Left ventricle: The cavity size was mildly dilated. Wall   thickness was increased in a pattern of mild LVH. Systolic   function was severely reduced. The estimated ejection fraction   was in the range of 20% to 25%. Diffuse hypokinesis. The study is   not technically sufficient to allow evaluation of LV diastolic   function. - Aortic valve: There was trivial regurgitation. - Right atrium: The atrium was mildly dilated. - Atrial septum: No defect or patent foramen ovale was identified. - Pulmonary arteries: PA peak pressure: 35 mm Hg (S). - Pericardium, extracardiac: A trivial pericardial effusion was   identified.  Transesophageal echocardiogram 04/05/16 LEFT VENTRICLE: EF = Dilated EF 10-15% with global HK RIGHT VENTRICLE: Moderate to severe global HK LEFT ATRIUM: Moderately to severely dilated (5.2cm) LEFT ATRIAL APPENDAGE: No clot RIGHT ATRIUM: Dilated AORTIC VALVE:  Trileaflet. Trivial AI. No AS.  MITRAL VALVE:    Normal. Trivial MR TRICUSPID VALVE: Normal mild TR PULMONIC VALVE: Grossly normal. Mild PI INTERATRIAL SEPTUM: No PFO or ASD PERICARDIUM: No effusion DESCENDING AORTA: Moderate to severe  plaque  _____________   History of Present Illness     Reginald Hahn is a 67 y.o. male with a history of COPD, systolic heart failure, atrial fibrillation, LBBB.  He was admitted in June 2017 with acute systolic heart failure in the setting of new onset atrial fibrillation with RVR and EF was found to be 15%.  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  At that time, he was pending cardioversion but developed massive retroperitoneal bleed, required multiple transfusions and all anticoagulants were held.  Cardiac catheterization in May 2018 demonstrated minimal nonobstructive CAD.  Ejection fraction has improved to 45-50%.  He was on amiodarone at one point but this was ultimately discontinued.  It was felt he would need Tikosyn if he had recurrent atrial fibrillation.  He presented to the hospital on the date of admission with worsening dyspnea and belly pain that seem to stem from receiving his Pneumovax in November 2018.  He was found to be in acute systolic heart failure in the setting of atrial fibrillation with RVR.  Hospital Course     Consultants: none    Atrial fibrillation with Rapid Ventricular Rate CHADS2-VASc=at least 5.   As noted, the patient presented to the hospital in atrial fibrillation with rapid ventricular rate.  Initially, he declined anticoagulation due to his prior history of life-threatening retroperitoneal bleed.  He had difficult to control heart rates which contributed to worsening heart failure.  He was placed on IV amiodarone with the intention for this to be  a short-term medication given his history of COPD.  He ultimately agreed to start Apixaban for anticoagulation with an eye towards transesophageal echocardiogram cardioversion.  He underwent TEE-cardioversion on December 27.  However, this failed to convert him to normal sinus rhythm.  He went in and out of atrial fibrillation for several days and was ultimately taken off of  milrinone and re-bolused with amiodarone.  He went back into atrial fibrillation again on 04/10/17 with controlled rate.  He was transitioned from IV to oral amiodarone 400 mg twice daily.  It was felt that he would hopefully stay in normal sinus rhythm over time with further amiodarone loading.  He would remain on 400 mg twice daily for 5 days, then 200 mg twice daily for 7 days, then 200 mg daily.  Long-term, he would likely benefit from atrial fibrillation ablation.  He was also placed on digoxin for rate control.  Acute on chronic systolic heart failure Echocardiogram this admission demonstrated worsening LV function with an EF of 20-25%.  He developed decompensated heart failure with low output symptoms and cardiogenic shock.  He required inotropic support with milrinone.  His IV Lasix was augmented with metolazone.  Initial Co-ox was 29%.  He developed acute kidney injury.  His volume status slowly improved as well as his creatinine.  After cardioversion, it was felt that milrinone was possibly contributing to recurrent atrial fibrillation and he was therefore weaned to off on 08/06/16.  He was ultimately transitioned back to oral Lasix.  COPD He developed acute exacerbation of COPD which was treated with nebulizers, 7 days of doxycycline and prednisone.  His symptoms slowly improved on this management.  Acute kidney injury This was related to acute tubular necrosis secondary to shock from decompensated heart failure with low output.  Peak creatinine was 2.59.  With diuresis and inotropic support, his creatinine slowly improved.  Creatinine at discharge was 1.55.  Hypokalemia His spironolactone was held at one point due to worsening creatinine.  He was placed back on spironolactone and this was continued at discharge.  Anxiety He did have difficulty with anxiety during this admission which was treated with as needed alprazolam.  Disposition The patient was evaluated by Dr. Shirlee Latch today and felt  to be stable for discharge to home.  He has close follow-up arranged for 04/17/17 in the heart failure clinic.  BMET, digoxin level will need to be drawn that day.  He will also be referred to EP for consideration of atrial fibrillation ablation.  Meds for home: Amiodarone 400 mg bid x 5 days then 200 mg bid x 7 days then 200 mg daily after that, apixaban 5 mg bid, Lasix 40 mg daily, KCl 20 daily, digoxin 0.125 daily, losartan 12.5 daily, spironolactone 12.5 daily.  _____________  Discharge Vitals Blood pressure 113/74, pulse 82, temperature 98.2 F (36.8 C), temperature source Oral, resp. rate 17, height 6' (1.829 m), weight 192 lb 3.2 oz (87.2 kg), SpO2 96 %.  Filed Weights   04/08/17 0453 04/09/17 0415 04/10/17 0518  Weight: 197 lb 4.8 oz (89.5 kg) 194 lb 1.6 oz (88 kg) 192 lb 3.2 oz (87.2 kg)    Labs & Radiologic Studies    CBC Recent Labs    04/09/17 0425 04/10/17 0542  WBC 8.8 8.4  NEUTROABS 5.6 5.3  HGB 14.1 13.8  HCT 44.7 44.3  MCV 93.1 93.9  PLT 326 311   Basic Metabolic Panel Recent Labs    91/47/82 0425 04/10/17 0542  NA 138 138  K 5.0 4.0  CL 102 100*  CO2 29 29  GLUCOSE 85 76  BUN 26* 25*  CREATININE 1.45* 1.55*  CALCIUM 8.4* 8.4*  MG  --  2.1   _____________  Dg Chest 2 View  Result Date: 03/26/2017 CLINICAL DATA:  67 year old male with flu-like symptoms. Chills, body aches, weakness and mild cough. History of COPD. Chest pain. EXAM: CHEST  2 VIEW COMPARISON:  Chest x-ray 08/25/2016. FINDINGS: Diffuse peribronchial cuffing. No acute consolidative airspace disease. No pleural effusions. No definite suspicious appearing pulmonary nodules or masses. No evidence of pulmonary edema. Heart size is borderline enlarged. The patient is rotated to the right on today's exam, resulting in distortion of the mediastinal contours and reduced diagnostic sensitivity and specificity for mediastinal pathology. Atherosclerotic calcifications in the thoracic aorta. IMPRESSION:  1. Diffuse peribronchial cuffing, concerning for an acute bronchitis. 2. Aortic atherosclerosis. Electronically Signed   By: Trudie Reed M.D.   On: 03/26/2017 16:41   Dg Chest Port 1 View  Result Date: 03/29/2017 CLINICAL DATA:  Right PICC line placement EXAM: PORTABLE CHEST 1 VIEW COMPARISON:  03/26/2016 FINDINGS: Cardiomegaly noted. A right PICC line is present with tip overlying the lower SVC. Peribronchial thickening and mild left basilar atelectasis again noted. There is no evidence of airspace disease, pleural effusion or pneumothorax. IMPRESSION: Right PICC line with tip overlying the lower SVC. Electronically Signed   By: Harmon Pier M.D.   On: 03/29/2017 14:16   Disposition   Pt is being discharged home today in stable and improved condition.  Follow-up Plans & Appointments    Follow-up Information    [facility] Follow up on 04/17/2017.   Specialty:  Cardiology Why:  Appointment: 04/17/2017 at 2:30 pm Please take all medications as prescribed the day of your appointment.  bring a list and/or med bottles with you to this appointment. [employer] [place]: [employer] information: [address] [id number] mc [place] [address] [phone]         Discharge Instructions    (HEART FAILURE PATIENTS) Call MD:  Anytime you have any of the following symptoms: 1) 3 pound weight gain in 24 hours or 5 pounds in 1 week 2) shortness of breath, with or without a dry hacking cough 3) swelling in the hands, feet or stomach 4) if you have to sleep on extra pillows at night in order to breathe.   Complete by:  As directed    Amb Referral to Cardiac Rehabilitation   Complete by:  As directed    Diagnosis:  Heart Failure (see criteria below if ordering Phase II)   Heart Failure Type:  Chronic Systolic   Diet - low sodium heart healthy   Complete by:  As directed    Increase activity slowly   Complete by:  As  directed    No wound care   Complete by:  As directed       Discharge Medications   Allergies as of 04/10/2017      Reactions   Heparin Anaphylaxis, Hives   Per patient    Codeine Itching   Whelps     PATIENT DENIES ALLERGY  TO CODEINE   Other    Seasonal allergies, "all spices, salt, pepper"      Medication List    STOP taking these medications   AEROCHAMBER MV inhaler   predniSONE 10 MG tablet Commonly known as:  DELTASONE     TAKE these  medications   acetaminophen 325 MG tablet Commonly known as:  TYLENOL Take 650 mg by mouth every 6 (six) hours as needed for mild pain or fever.   albuterol 108 (90 Base) MCG/ACT inhaler Commonly known as:  PROVENTIL HFA;VENTOLIN HFA Inhale 2 puffs into the lungs every 4 (four) hours as needed for wheezing or shortness of breath.   amiodarone 200 MG tablet Commonly known as:  PACERONE Take 2 tabs twice daily for 5 days, then 1 tab twice daily for 7 days, then 1 tab daily.   apixaban 5 MG Tabs tablet Commonly known as:  ELIQUIS Take 1 tablet (5 mg total) by mouth 2 (two) times daily.   budesonide-formoterol 160-4.5 MCG/ACT inhaler Commonly known as:  SYMBICORT Inhale 2 puffs 2 (two) times daily into the lungs.   cetirizine 10 MG tablet Commonly known as:  ZYRTEC Take 10 mg by mouth daily.   digoxin 0.125 MG tablet Commonly known as:  LANOXIN Take 1 tablet (0.125 mg total) by mouth daily. Start taking on:  04/11/2017   furosemide 40 MG tablet Commonly known as:  LASIX Take 1 tablet (40 mg total) by mouth daily. Start taking on:  04/11/2017   losartan 25 MG tablet Commonly known as:  COZAAR Take 0.5 tablets (12.5 mg total) by mouth daily. Start taking on:  04/11/2017   montelukast 10 MG tablet Commonly known as:  SINGULAIR Take 1 tablet (10 mg total) at bedtime by mouth.   multivitamin with minerals Tabs tablet Take 1 tablet by mouth daily.   nicotine 14 mg/24hr patch Commonly known as:  NICODERM CQ - dosed in mg/24  hours Place 14 mg onto the skin as needed (Urge to smoke).   omeprazole 20 MG capsule Commonly known as:  PRILOSEC Take 20 mg by mouth every morning.   OXYGEN Inhale 1 L into the lungs as needed (Shortness of breath).   potassium chloride SA 20 MEQ tablet Commonly known as:  K-DUR,KLOR-CON Take 1 tablet (20 mEq total) by mouth daily.   sodium chloride 0.65 % Soln nasal spray Commonly known as:  OCEAN Place 1 spray into both nostrils as needed for congestion.   spironolactone 25 MG tablet Commonly known as:  ALDACTONE Take 0.5 tablets (12.5 mg total) by mouth daily. Start taking on:  04/11/2017   Tiotropium Bromide Monohydrate 2.5 MCG/ACT Aers Commonly known as:  SPIRIVA RESPIMAT Inhale 2 puffs into the lungs daily.         Outstanding Labs/Studies   1.  Basic metabolic panel, digoxin level 04/15/36  Duration of Discharge Encounter   Greater than 30 minutes including physician time.  Signed, Tereso Newcomer, PA-C  04/10/2017, 12:59 PM

## 2017-04-10 NOTE — Care Management Note (Signed)
Case Management Note  Patient Details  Name: Reginald Hahn MRN: [id number] Date of Birth: 30-Apr-1950  Subjective/Objective:         Patient presented to [facility] ED with acute systolic heart failure in the setting of atrial fibrillation with RVR.           Action/Plan:  ED CM received call from Chrissy RN concerning patient being discharged home today. Nurse feels that patient may benefit from [facility] services and wife reported that he has had HH with [facility]. CM instructed nurse to obtain [facility] and referral faxed to [facility] via [facility]. No further CM needs noted.   Expected Discharge Date:  04/10/17               Expected Discharge Plan:  Home w Home Health Services  In-House Referral:     Discharge planning Services  CM Consult  Post Acute Care Choice:    Choice offered to:  Patient, Spouse  DME Arranged:    DME Agency:     HH Arranged:  RN, Disease Management HH Agency:  [facility] (now [facility])  Status of Service:  Completed, signed off  If discussed at Long Length of Stay Meetings, dates discussed:    Additional CommentsMichel Bickers, RN 04/10/2017, 6:32 PM

## 2017-04-10 NOTE — Progress Notes (Signed)
Patients heart rate in the 70s to 80s. He has been stable all afternoon. Patients wife requested a HH RN to come to the house for medicine checks and disease management. I spoke with Bjorn Loser and received the orde. I spoke with [place], CM and she was going to set it up with [facility] care services per family request. Pt d/c'd via wheelchair to private vehicle and wife.

## 2017-04-11 ENCOUNTER — Telehealth (HOSPITAL_COMMUNITY): Payer: Self-pay

## 2017-04-11 NOTE — Telephone Encounter (Signed)
Patients insurance is active and benefits verified through Medicare Part A & B - No co-pay, deductible amount of $185.00/$0.00 has been met, no out of pocket, 20% co-insurance, and no pre-authorization is required. Passport/reference [id number]  Patient will be contacted and scheduled after their follow up appt with the Cardiologist office upon review by the RN Navigator

## 2017-04-12 ENCOUNTER — Telehealth (HOSPITAL_COMMUNITY): Payer: Self-pay

## 2017-04-12 ENCOUNTER — Telehealth (HOSPITAL_COMMUNITY): Payer: Self-pay | Admitting: *Deleted

## 2017-04-12 DIAGNOSIS — I5022 Chronic systolic (congestive) heart failure: Secondary | ICD-10-CM

## 2017-04-12 NOTE — Telephone Encounter (Signed)
-----   Message from Laurey Morale, MD sent at 04/10/2017 10:00 AM EST ----- Needs BMET/digoxin drawn Friday then followup 10-14 days.

## 2017-04-12 NOTE — Telephone Encounter (Signed)
Lab orders placed, labs sch for 1/4

## 2017-04-12 NOTE — Telephone Encounter (Signed)
Called and spoke with patient in regards to interest in the program. Patient is interested in the program - explained the scheduling process to patient. We will contact after his follow up appt on 04/17/2017 with Dr.Bensimhon.

## 2017-04-13 ENCOUNTER — Ambulatory Visit (HOSPITAL_COMMUNITY)
Admission: RE | Admit: 2017-04-13 | Discharge: 2017-04-13 | Disposition: A | Discharge status: Home / Self Care | Payer: Medicare Other | Source: Ambulatory Visit | Attending: Internal Medicine | Admitting: Internal Medicine

## 2017-04-13 DIAGNOSIS — I5022 Chronic systolic (congestive) heart failure: Secondary | ICD-10-CM | POA: Insufficient documentation

## 2017-04-13 LAB — BASIC METABOLIC PANEL
ANION GAP: 9 (ref 5–15)
BUN: 18 mg/dL (ref 6–20)
CHLORIDE: 103 mmol/L (ref 101–111)
CO2: 24 mmol/L (ref 22–32)
Calcium: 8.7 mg/dL — ABNORMAL LOW (ref 8.9–10.3)
Creatinine, Ser: 1.31 mg/dL — ABNORMAL HIGH (ref 0.61–1.24)
GFR calc non Af Amer: 55 mL/min — ABNORMAL LOW (ref >60)
Glucose, Bld: 94 mg/dL (ref 65–99)
POTASSIUM: 4.2 mmol/L (ref 3.5–5.1)
Sodium: 136 mmol/L (ref 135–145)

## 2017-04-13 LAB — DIGOXIN LEVEL: Digoxin Level: 0.6 ng/mL — ABNORMAL LOW (ref 0.8–2.0)

## 2017-04-14 DIAGNOSIS — J449 Chronic obstructive pulmonary disease, unspecified: Secondary | ICD-10-CM | POA: Diagnosis not present

## 2017-04-14 DIAGNOSIS — I5023 Acute on chronic systolic (congestive) heart failure: Secondary | ICD-10-CM | POA: Diagnosis not present

## 2017-04-14 DIAGNOSIS — I4891 Unspecified atrial fibrillation: Secondary | ICD-10-CM | POA: Diagnosis not present

## 2017-04-14 DIAGNOSIS — F419 Anxiety disorder, unspecified: Secondary | ICD-10-CM | POA: Diagnosis not present

## 2017-04-14 DIAGNOSIS — I2510 Atherosclerotic heart disease of native coronary artery without angina pectoris: Secondary | ICD-10-CM | POA: Diagnosis not present

## 2017-04-14 DIAGNOSIS — I447 Left bundle-branch block, unspecified: Secondary | ICD-10-CM | POA: Diagnosis not present

## 2017-04-17 ENCOUNTER — Ambulatory Visit (HOSPITAL_COMMUNITY)
Admit: 2017-04-17 | Discharge: 2017-04-17 | Disposition: A | Discharge status: Home / Self Care | Payer: Medicare Other | Attending: Cardiology | Admitting: Cardiology

## 2017-04-17 VITALS — BP 130/72 | HR 80 | Wt 195.2 lb

## 2017-04-17 DIAGNOSIS — I2510 Atherosclerotic heart disease of native coronary artery without angina pectoris: Secondary | ICD-10-CM | POA: Diagnosis not present

## 2017-04-17 DIAGNOSIS — J449 Chronic obstructive pulmonary disease, unspecified: Secondary | ICD-10-CM | POA: Insufficient documentation

## 2017-04-17 DIAGNOSIS — I4891 Unspecified atrial fibrillation: Secondary | ICD-10-CM

## 2017-04-17 DIAGNOSIS — B349 Viral infection, unspecified: Secondary | ICD-10-CM | POA: Diagnosis not present

## 2017-04-17 DIAGNOSIS — Z7901 Long term (current) use of anticoagulants: Secondary | ICD-10-CM | POA: Insufficient documentation

## 2017-04-17 DIAGNOSIS — I5022 Chronic systolic (congestive) heart failure: Secondary | ICD-10-CM | POA: Diagnosis not present

## 2017-04-17 DIAGNOSIS — I447 Left bundle-branch block, unspecified: Secondary | ICD-10-CM

## 2017-04-17 DIAGNOSIS — Z87891 Personal history of nicotine dependence: Secondary | ICD-10-CM | POA: Diagnosis not present

## 2017-04-17 DIAGNOSIS — I429 Cardiomyopathy, unspecified: Secondary | ICD-10-CM | POA: Insufficient documentation

## 2017-04-17 DIAGNOSIS — I110 Hypertensive heart disease with heart failure: Secondary | ICD-10-CM | POA: Diagnosis not present

## 2017-04-17 DIAGNOSIS — I480 Paroxysmal atrial fibrillation: Secondary | ICD-10-CM | POA: Insufficient documentation

## 2017-04-17 DIAGNOSIS — Z79899 Other long term (current) drug therapy: Secondary | ICD-10-CM | POA: Diagnosis not present

## 2017-04-17 DIAGNOSIS — R000 Tachycardia, unspecified: Secondary | ICD-10-CM | POA: Diagnosis not present

## 2017-04-17 LAB — BASIC METABOLIC PANEL
ANION GAP: 11 (ref 5–15)
BUN: 16 mg/dL (ref 6–20)
CHLORIDE: 102 mmol/L (ref 101–111)
CO2: 22 mmol/L (ref 22–32)
CREATININE: 1.2 mg/dL (ref 0.61–1.24)
Calcium: 9.2 mg/dL (ref 8.9–10.3)
GFR calc non Af Amer: >60 mL/min (ref >60)
Glucose, Bld: 114 mg/dL — ABNORMAL HIGH (ref 65–99)
Potassium: 4.5 mmol/L (ref 3.5–5.1)
Sodium: 135 mmol/L (ref 135–145)

## 2017-04-17 LAB — CBC
HCT: 47.5 % (ref 39.0–52.0)
HEMOGLOBIN: 15 g/dL (ref 13.0–17.0)
MCH: 29.1 pg (ref 26.0–34.0)
MCHC: 31.6 g/dL (ref 30.0–36.0)
MCV: 92.1 fL (ref 78.0–100.0)
Platelets: 333 10*3/uL (ref 150–400)
RBC: 5.16 MIL/uL (ref 4.22–5.81)
RDW: 14.5 % (ref 11.5–15.5)
WBC: 9.9 10*3/uL (ref 4.0–10.5)

## 2017-04-17 NOTE — Patient Instructions (Signed)
Routine lab work today. Will notify you of abnormal results, otherwise no news is good news!  Continue Amiodarone 200 mg twice daily.  Follow up 4 weeks with echocardiogram and appointment with Otilio Saber PA-C.  Take all medication as prescribed the day of your appointment. Bring all medications with you to your appointment.  Do the following things EVERYDAY: 1) Weigh yourself in the morning before breakfast. Write it down and keep it in a log. 2) Take your medicines as prescribed 3) Eat low salt foods-Limit salt (sodium) to 2000 mg per day.  4) Stay as active as you can everyday 5) Limit all fluids for the day to less than 2 liters

## 2017-04-17 NOTE — Progress Notes (Signed)
Advanced Heart Failure Clinic Note   Patient ID: Reginald Hahn, male   DOB: 1951-02-19, 67 y.o.   MRN: [id number] Primary Cardiologist: Dr. Gala Romney   HPI: [name] "Reginald Hahn" is a 67 y.o. male with h/o COPD admitted in 6/17 with acute systolic HF, AF with RVR and cardiogenic shock. LBBB  Patient was admitted 6/17 with acute systolic HF and new onset afib with RVR and EF found to be 15%.Felt to to have viral CM exacerbated by afib. Diuresed slowly so PICC placed and co-ox low. Started on IV amio and milrinone and subsequently diuresed well. Was pending cath and TEE/DC-CV but developed massive RP bleed and was transferred to ICU.   All anti-coagulants held. Received multiple transfusions. Subsequently recovered. During that time also had loss of peripheral vision in R eye. Head CT negative. Pre discharge had Myoview with EF 30-44%.  Inferior infarct vs gut attenuation. No ischemia.    Pulmonary Clinic with Dr. Jamison Neighbor and told he has severe COPD FEV1 1.83 (49%) FEF 25-75% 0.60L  DLCO 42%.   Pt admitted from 12/17 -> 04/10/17 with complicated admission consisting of AF with RVR, low output CHF, and URI. Pt transiently required milrinone.  After long discussion pt agreed to be placed on Eliquis, so was then candidate for DCCV with TEE. Pt failed DCCV 04/05/18 initially, but then converted to NSR overnight on IV amidoarone. Pt continued to go in-and-out of Afib throughout his admission. Decision made to send home on continued Amiodarone. Tolerated milrinone wean.   He presents today for post hospital follow up. Overall doing much better. Denies SOB with exertion at home. Remains slightly fatigued, but feels like he is getting stronger. Has not been taking his spiriva, as he was worried it had added to his decompensation. Does use his ventolin inhaler frequently. Denies edema, lightheadedness, dizziness, or palpitations. No chest pain. Denies bleeding on Eliquis. Weight continues to come down.    Myoview 6/17  There was no ST segment deviation noted during stress.  The left ventricular ejection fraction is moderately decreased (30-44%).  Findings consistent with prior myocardial infarction.  This is an intermediate risk study.  nferior/inferolateral defect (moderate) that does not improve in the rest images consistent with scar and possible soft tissue attenuation. No significant ischemia.  RHC/LHC 08/29/2016 RA = 6 RV = 40/8 PA = 40/13 (24) PCW = 19 Ao = 104/82 (93) LV = 141/17 Fick cardiac output/index = 4.8/2.3 PVR = 1.0 WU SVR 1437  Ao sat = 90% PA sat = 62%, 63% Assessment: 1. Minimal non-obstructive CAD 2. Severe nonischemic CM with EF 25% by cath (EF likely underestimated on re-look likely 40%) 3. Relatively well compensated filling pressures with mild pulmonary venous HTN   Echo 8/17 EF 45% Echo 2/18 EF 45-50%  Review of systems complete and found to be negative unless listed in HPI.    Past Medical History:  Diagnosis Date  . Allergic rhinitis   . Allergy    seasonal allergies  . Arthritis   . Atrial fibrillation (HCC) 03/2017  . Black widow spider bite   . Chronic systolic heart failure (HCC) 09/16/2015   Possibly tachycardia mediated // Echo 12/18: EF 20-25, trivial AI, mild RAE, PASP 35  . COPD (chronic obstructive pulmonary disease) (HCC)   . Stroke (cerebrum) (HCC)   . Tobacco use    x40 years. 1 ppd   SH:  Social History   Socioeconomic History  . Marital status: Divorced    Spouse name: Not  on file  . Number of children: Not on file  . Years of education: Not on file  . Highest education level: Not on file  Social Needs  . Financial resource strain: Not on file  . Food insecurity - worry: Not on file  . Food insecurity - inability: Not on file  . Transportation needs - medical: Not on file  . Transportation needs - non-medical: Not on file  Occupational History  . Not on file  Tobacco Use  . Smoking status: Former Smoker     Packs/day: 2.00    Years: 50.00    Pack years: 100.00    Types: Cigarettes    Start date: 04/14/1963    Last attempt to quit: 10/09/2015    Years since quitting: 1.5  . Smokeless tobacco: Never Used  . Tobacco comment: stopped for a couple of years in his 30s  Substance and Sexual Activity  . Alcohol use: No    Alcohol/week: 0.0 oz  . Drug use: No    Comment: Used to smoke marijuana  . Sexual activity: Not on file  Other Topics Concern  . 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 No pets currently. Remote exposure to a parrot for 2 years.     FH:  Family History  Problem Relation Age of Onset  . Heart disease Mother        died in her 52's.  . Diabetes Mother   . Prostate cancer Father        died in his 56's.  . Congenital heart disease Brother        died @ age 52.  . Lung disease Neg Hx     Past Medical History:  Diagnosis Date  . Allergic rhinitis   . Allergy    seasonal allergies  . Arthritis   . Atrial fibrillation (HCC) 03/2017  . Black widow spider bite   . Chronic systolic heart failure (HCC) 09/16/2015   Possibly tachycardia mediated // Echo 12/18: EF 20-25, trivial AI, mild RAE, PASP 35  . COPD (chronic obstructive pulmonary disease) (HCC)   . Stroke (cerebrum) (HCC)   . Tobacco use    x40 years. 1 ppd    Current Outpatient Medications  Medication Sig Dispense Refill  . acetaminophen (TYLENOL) 325 MG tablet Take 650 mg by mouth every 6 (six) hours as needed for mild pain or fever.    Marland Kitchen albuterol (PROVENTIL HFA;VENTOLIN HFA) 108 (90 Base) MCG/ACT inhaler Inhale 2 puffs into the lungs every 4 (four) hours as needed for wheezing or shortness of breath. 1 Inhaler 5  . amiodarone (PACERONE)  200 MG tablet Take 2 tabs twice daily for 5 days, then 1 tab twice daily for 7 days, then 1 tab daily. 40 tablet 11  . apixaban (ELIQUIS) 5 MG TABS tablet Take 1 tablet (5 mg total) by mouth 2 (two) times daily. 60 tablet 11  . budesonide-formoterol (SYMBICORT) 160-4.5 MCG/ACT inhaler Inhale 2 puffs 2 (two) times daily into the lungs. 1 Inhaler 6  . cetirizine (ZYRTEC) 10 MG tablet Take 10 mg by mouth daily.    . digoxin (LANOXIN) 0.125 MG tablet Take 1 tablet (0.125 mg total)  by mouth daily. 30 tablet 11  . furosemide (LASIX) 40 MG tablet Take 1 tablet (40 mg total) by mouth daily. 30 tablet 11  . losartan (COZAAR) 25 MG tablet Take 0.5 tablets (12.5 mg total) by mouth daily. 15 tablet 11  . Multiple Vitamin (MULTIVITAMIN WITH MINERALS) TABS tablet Take 1 tablet by mouth daily.    Marland Kitchen omeprazole (PRILOSEC) 20 MG capsule Take 20 mg by mouth every morning.    . OXYGEN Inhale 1 L into the lungs as needed (Shortness of breath).    . potassium chloride SA (K-DUR,KLOR-CON) 20 MEQ tablet Take 1 tablet (20 mEq total) by mouth daily. 30 tablet 11  . sodium chloride (OCEAN) 0.65 % SOLN nasal spray Place 1 spray into both nostrils as needed for congestion.    Marland Kitchen spironolactone (ALDACTONE) 25 MG tablet Take 0.5 tablets (12.5 mg total) by mouth daily. 15 tablet 11  . Tiotropium Bromide Monohydrate (SPIRIVA RESPIMAT) 2.5 MCG/ACT AERS Inhale 2 puffs into the lungs daily. 1 Inhaler 3   No current facility-administered medications for this encounter.     Vitals:   04/17/17 1427  BP: 130/72  Pulse: 80  SpO2: 96%  Weight: 195 lb 4 oz (88.6 kg)   Wt Readings from Last 3 Encounters:  04/17/17 195 lb 4 oz (88.6 kg)  04/10/17 192 lb 3.2 oz (87.2 kg)  02/22/17 208 lb (94.3 kg)     PHYSICAL EXAM: General: Well appearing. No resp difficulty. HEENT: Normal anicteric Neck: Supple. JVP 5-6. Carotids 2+ bilat; no bruits. No thyromegaly or nodule noted. Cor: PMI nondisplaced. Irregularly irregular.  Lungs: CTAB,  normal effort. No wheezing.  Abdomen: Soft, non-tender, non-distended, no HSM. No bruits or masses. +BS  Extremities: No cyanosis, clubbing, or rash. R and LLE no edema.  Neuro: Alert & orientedx3, cranial nerves grossly intact. moves all 4 extremities w/o difficulty. Affect pleasant   EKG Afib 72 bpm, personally reviewed  ASSESSMENT & PLAN:  1. Chronic Systolic HF due to tachy-induced CM in the setting of recurrent AF in setting of viral illness - Echo 03/28/2017 LVEF ~20%, worsened from Echo 2/18 EF 45-50% in setting of Afib RVR.  - NYHA II-III now.  - Volume status stable on exam - Continue lasix 40 mg daily.  - Continue spiro 12.5 mg daily.  - Continue losartan 12.5 mg qhs.  - Continue digoxin 0.125 mg daily - No BB with recent low output - Reinforced fluid restriction to < 2 L daily, sodium restriction to less than 2000 mg daily, and the importance of daily weights.   2. PAF  - Continue amiodarone 200 mg BID for now.  - Rate controlled. He seems to be going in and out of Afib by history and observation in hospital.  - Now on Eliquis without bleeding.  - Could consider AF ablation down the road if worsens.  3. h/o RP bleed - Resolved. No s/s of bleeding on eliquis. Follow closely 4. COPD - Sees Dr. Sherene Sires (Previosly Dr. Jamison Neighbor) - OK to resume Spiriva. Limit ventolin to as needed.  - Not smoking. No change.  5. Transient loss of R peripheral vision - Concerning for embolic CVA in setting of AF - Pt declines Neuro referral, MRI, and/or carotid dopplers. No change.  - Now on Eliquis as above.  6. HTN - Stable on current medications.  7. LBBB - QRS 142 on EKG today.   Discussed case at length with Dr. Gala Romney. Continue amiodarone 200 mg BID for now. Volume  status stable. Labs today. Follow up 4 weeks with Echo to look for early EF recovery with rate control.   Graciella Freer, PA-C  2:42 PM   Patient seen and examined with the above-signed Advanced Practice  Provider and/or Housestaff. I personally reviewed laboratory data, imaging studies and relevant notes. I independently examined the patient and formulated the important aspects of the plan. I have edited the note to reflect any of my changes or salient points. I have personally discussed the plan with the patient and/or family.  He returns for post-hospital f/u. Recent prolonged hospitalization for cardiogenic shock in the setting of recurrent AF with tachy-related CM. Feeling much better today. Seems to be going in/out AF but rate is well controlled. Volume status looks good. No evidence of bleeding on Eliquis. Will check labs today.  Would continue current regimen. See back in several weeks with ECHO. Suspect EF will recover. Once EF recovers would refer for AF ablation as this is the second episode of severe HF in the setting of tachy-induced CM.   Total time spent 35 minutes. Over half that time spent discussing above.   Arvilla Meres, MD  7:29 PM

## 2017-04-18 DIAGNOSIS — I5023 Acute on chronic systolic (congestive) heart failure: Secondary | ICD-10-CM | POA: Diagnosis not present

## 2017-04-18 DIAGNOSIS — I2510 Atherosclerotic heart disease of native coronary artery without angina pectoris: Secondary | ICD-10-CM | POA: Diagnosis not present

## 2017-04-18 DIAGNOSIS — I4891 Unspecified atrial fibrillation: Secondary | ICD-10-CM | POA: Diagnosis not present

## 2017-04-18 DIAGNOSIS — I447 Left bundle-branch block, unspecified: Secondary | ICD-10-CM | POA: Diagnosis not present

## 2017-04-18 DIAGNOSIS — J449 Chronic obstructive pulmonary disease, unspecified: Secondary | ICD-10-CM | POA: Diagnosis not present

## 2017-04-18 DIAGNOSIS — F419 Anxiety disorder, unspecified: Secondary | ICD-10-CM | POA: Diagnosis not present

## 2017-04-20 ENCOUNTER — Telehealth (HOSPITAL_COMMUNITY): Payer: Self-pay

## 2017-04-20 DIAGNOSIS — I447 Left bundle-branch block, unspecified: Secondary | ICD-10-CM | POA: Diagnosis not present

## 2017-04-20 DIAGNOSIS — J449 Chronic obstructive pulmonary disease, unspecified: Secondary | ICD-10-CM | POA: Diagnosis not present

## 2017-04-20 DIAGNOSIS — I2510 Atherosclerotic heart disease of native coronary artery without angina pectoris: Secondary | ICD-10-CM | POA: Diagnosis not present

## 2017-04-20 DIAGNOSIS — I5023 Acute on chronic systolic (congestive) heart failure: Secondary | ICD-10-CM | POA: Diagnosis not present

## 2017-04-20 DIAGNOSIS — I4891 Unspecified atrial fibrillation: Secondary | ICD-10-CM | POA: Diagnosis not present

## 2017-04-20 DIAGNOSIS — F419 Anxiety disorder, unspecified: Secondary | ICD-10-CM | POA: Diagnosis not present

## 2017-04-20 NOTE — Telephone Encounter (Signed)
Attempted to call patient in regards to cardiac rehab - lm on vm °

## 2017-04-24 DIAGNOSIS — F419 Anxiety disorder, unspecified: Secondary | ICD-10-CM | POA: Diagnosis not present

## 2017-04-24 DIAGNOSIS — I5023 Acute on chronic systolic (congestive) heart failure: Secondary | ICD-10-CM | POA: Diagnosis not present

## 2017-04-24 DIAGNOSIS — I447 Left bundle-branch block, unspecified: Secondary | ICD-10-CM | POA: Diagnosis not present

## 2017-04-24 DIAGNOSIS — I4891 Unspecified atrial fibrillation: Secondary | ICD-10-CM | POA: Diagnosis not present

## 2017-04-24 DIAGNOSIS — I2510 Atherosclerotic heart disease of native coronary artery without angina pectoris: Secondary | ICD-10-CM | POA: Diagnosis not present

## 2017-04-24 DIAGNOSIS — J449 Chronic obstructive pulmonary disease, unspecified: Secondary | ICD-10-CM | POA: Diagnosis not present

## 2017-04-27 ENCOUNTER — Telehealth (HOSPITAL_COMMUNITY): Payer: Self-pay

## 2017-04-27 DIAGNOSIS — I2510 Atherosclerotic heart disease of native coronary artery without angina pectoris: Secondary | ICD-10-CM | POA: Diagnosis not present

## 2017-04-27 DIAGNOSIS — I5023 Acute on chronic systolic (congestive) heart failure: Secondary | ICD-10-CM | POA: Diagnosis not present

## 2017-04-27 DIAGNOSIS — I447 Left bundle-branch block, unspecified: Secondary | ICD-10-CM | POA: Diagnosis not present

## 2017-04-27 DIAGNOSIS — J449 Chronic obstructive pulmonary disease, unspecified: Secondary | ICD-10-CM | POA: Diagnosis not present

## 2017-04-27 DIAGNOSIS — F419 Anxiety disorder, unspecified: Secondary | ICD-10-CM | POA: Diagnosis not present

## 2017-04-27 DIAGNOSIS — I4891 Unspecified atrial fibrillation: Secondary | ICD-10-CM | POA: Diagnosis not present

## 2017-04-27 NOTE — Telephone Encounter (Signed)
Called and spoke with patient in regards to Cardiac Rehab - Patient is interested in the program. Scheduled orientation on 06/05/2017 at 8:45am. Patient will attend the 9:45am exc class.

## 2017-05-01 DIAGNOSIS — I447 Left bundle-branch block, unspecified: Secondary | ICD-10-CM | POA: Diagnosis not present

## 2017-05-01 DIAGNOSIS — I5023 Acute on chronic systolic (congestive) heart failure: Secondary | ICD-10-CM | POA: Diagnosis not present

## 2017-05-01 DIAGNOSIS — J449 Chronic obstructive pulmonary disease, unspecified: Secondary | ICD-10-CM | POA: Diagnosis not present

## 2017-05-01 DIAGNOSIS — F419 Anxiety disorder, unspecified: Secondary | ICD-10-CM | POA: Diagnosis not present

## 2017-05-01 DIAGNOSIS — I2510 Atherosclerotic heart disease of native coronary artery without angina pectoris: Secondary | ICD-10-CM | POA: Diagnosis not present

## 2017-05-01 DIAGNOSIS — I4891 Unspecified atrial fibrillation: Secondary | ICD-10-CM | POA: Diagnosis not present

## 2017-05-04 DIAGNOSIS — I2510 Atherosclerotic heart disease of native coronary artery without angina pectoris: Secondary | ICD-10-CM | POA: Diagnosis not present

## 2017-05-04 DIAGNOSIS — I447 Left bundle-branch block, unspecified: Secondary | ICD-10-CM | POA: Diagnosis not present

## 2017-05-04 DIAGNOSIS — J449 Chronic obstructive pulmonary disease, unspecified: Secondary | ICD-10-CM | POA: Diagnosis not present

## 2017-05-04 DIAGNOSIS — I4891 Unspecified atrial fibrillation: Secondary | ICD-10-CM | POA: Diagnosis not present

## 2017-05-04 DIAGNOSIS — I5023 Acute on chronic systolic (congestive) heart failure: Secondary | ICD-10-CM | POA: Diagnosis not present

## 2017-05-04 DIAGNOSIS — F419 Anxiety disorder, unspecified: Secondary | ICD-10-CM | POA: Diagnosis not present

## 2017-05-08 DIAGNOSIS — I2510 Atherosclerotic heart disease of native coronary artery without angina pectoris: Secondary | ICD-10-CM | POA: Diagnosis not present

## 2017-05-08 DIAGNOSIS — F419 Anxiety disorder, unspecified: Secondary | ICD-10-CM | POA: Diagnosis not present

## 2017-05-08 DIAGNOSIS — I4891 Unspecified atrial fibrillation: Secondary | ICD-10-CM | POA: Diagnosis not present

## 2017-05-08 DIAGNOSIS — J449 Chronic obstructive pulmonary disease, unspecified: Secondary | ICD-10-CM | POA: Diagnosis not present

## 2017-05-08 DIAGNOSIS — I5023 Acute on chronic systolic (congestive) heart failure: Secondary | ICD-10-CM | POA: Diagnosis not present

## 2017-05-08 DIAGNOSIS — I447 Left bundle-branch block, unspecified: Secondary | ICD-10-CM | POA: Diagnosis not present

## 2017-05-11 DIAGNOSIS — F419 Anxiety disorder, unspecified: Secondary | ICD-10-CM | POA: Diagnosis not present

## 2017-05-11 DIAGNOSIS — I4891 Unspecified atrial fibrillation: Secondary | ICD-10-CM | POA: Diagnosis not present

## 2017-05-11 DIAGNOSIS — I5023 Acute on chronic systolic (congestive) heart failure: Secondary | ICD-10-CM | POA: Diagnosis not present

## 2017-05-11 DIAGNOSIS — I447 Left bundle-branch block, unspecified: Secondary | ICD-10-CM | POA: Diagnosis not present

## 2017-05-11 DIAGNOSIS — J449 Chronic obstructive pulmonary disease, unspecified: Secondary | ICD-10-CM | POA: Diagnosis not present

## 2017-05-11 DIAGNOSIS — I2510 Atherosclerotic heart disease of native coronary artery without angina pectoris: Secondary | ICD-10-CM | POA: Diagnosis not present

## 2017-05-14 DIAGNOSIS — I2510 Atherosclerotic heart disease of native coronary artery without angina pectoris: Secondary | ICD-10-CM | POA: Diagnosis not present

## 2017-05-14 DIAGNOSIS — I4891 Unspecified atrial fibrillation: Secondary | ICD-10-CM | POA: Diagnosis not present

## 2017-05-14 DIAGNOSIS — J449 Chronic obstructive pulmonary disease, unspecified: Secondary | ICD-10-CM | POA: Diagnosis not present

## 2017-05-14 DIAGNOSIS — F419 Anxiety disorder, unspecified: Secondary | ICD-10-CM | POA: Diagnosis not present

## 2017-05-14 DIAGNOSIS — I5023 Acute on chronic systolic (congestive) heart failure: Secondary | ICD-10-CM | POA: Diagnosis not present

## 2017-05-14 DIAGNOSIS — I447 Left bundle-branch block, unspecified: Secondary | ICD-10-CM | POA: Diagnosis not present

## 2017-05-17 DIAGNOSIS — I5023 Acute on chronic systolic (congestive) heart failure: Secondary | ICD-10-CM | POA: Diagnosis not present

## 2017-05-17 DIAGNOSIS — F419 Anxiety disorder, unspecified: Secondary | ICD-10-CM | POA: Diagnosis not present

## 2017-05-17 DIAGNOSIS — I2510 Atherosclerotic heart disease of native coronary artery without angina pectoris: Secondary | ICD-10-CM | POA: Diagnosis not present

## 2017-05-17 DIAGNOSIS — J449 Chronic obstructive pulmonary disease, unspecified: Secondary | ICD-10-CM | POA: Diagnosis not present

## 2017-05-17 DIAGNOSIS — I4891 Unspecified atrial fibrillation: Secondary | ICD-10-CM | POA: Diagnosis not present

## 2017-05-17 DIAGNOSIS — I447 Left bundle-branch block, unspecified: Secondary | ICD-10-CM | POA: Diagnosis not present

## 2017-05-18 ENCOUNTER — Ambulatory Visit (HOSPITAL_BASED_OUTPATIENT_CLINIC_OR_DEPARTMENT_OTHER)
Admission: RE | Admit: 2017-05-18 | Discharge: 2017-05-18 | Disposition: A | Discharge status: Home / Self Care | Payer: Medicare Other | Source: Ambulatory Visit | Attending: Cardiology | Admitting: Cardiology

## 2017-05-18 ENCOUNTER — Ambulatory Visit (HOSPITAL_COMMUNITY)
Admission: RE | Admit: 2017-05-18 | Discharge: 2017-05-18 | Disposition: A | Discharge status: Home / Self Care | Payer: Medicare Other | Source: Ambulatory Visit | Attending: Cardiology | Admitting: Cardiology

## 2017-05-18 ENCOUNTER — Encounter (HOSPITAL_COMMUNITY): Payer: Self-pay

## 2017-05-18 VITALS — BP 152/98 | HR 76 | Wt 193.8 lb

## 2017-05-18 DIAGNOSIS — I480 Paroxysmal atrial fibrillation: Secondary | ICD-10-CM | POA: Insufficient documentation

## 2017-05-18 DIAGNOSIS — Z87891 Personal history of nicotine dependence: Secondary | ICD-10-CM | POA: Insufficient documentation

## 2017-05-18 DIAGNOSIS — I5022 Chronic systolic (congestive) heart failure: Secondary | ICD-10-CM | POA: Diagnosis not present

## 2017-05-18 DIAGNOSIS — I10 Essential (primary) hypertension: Secondary | ICD-10-CM

## 2017-05-18 DIAGNOSIS — Z7901 Long term (current) use of anticoagulants: Secondary | ICD-10-CM | POA: Diagnosis not present

## 2017-05-18 DIAGNOSIS — I447 Left bundle-branch block, unspecified: Secondary | ICD-10-CM | POA: Diagnosis not present

## 2017-05-18 DIAGNOSIS — J449 Chronic obstructive pulmonary disease, unspecified: Secondary | ICD-10-CM | POA: Diagnosis not present

## 2017-05-18 DIAGNOSIS — I429 Cardiomyopathy, unspecified: Secondary | ICD-10-CM | POA: Diagnosis not present

## 2017-05-18 DIAGNOSIS — I2510 Atherosclerotic heart disease of native coronary artery without angina pectoris: Secondary | ICD-10-CM | POA: Diagnosis not present

## 2017-05-18 DIAGNOSIS — I110 Hypertensive heart disease with heart failure: Secondary | ICD-10-CM | POA: Insufficient documentation

## 2017-05-18 DIAGNOSIS — Z79899 Other long term (current) drug therapy: Secondary | ICD-10-CM | POA: Diagnosis not present

## 2017-05-18 LAB — BASIC METABOLIC PANEL
ANION GAP: 11 (ref 5–15)
BUN: 12 mg/dL (ref 6–20)
CALCIUM: 9.3 mg/dL (ref 8.9–10.3)
CO2: 26 mmol/L (ref 22–32)
Chloride: 102 mmol/L (ref 101–111)
Creatinine, Ser: 1.08 mg/dL (ref 0.61–1.24)
GFR calc Af Amer: >60 mL/min (ref >60)
GFR calc non Af Amer: >60 mL/min (ref >60)
GLUCOSE: 73 mg/dL (ref 65–99)
Potassium: 3.9 mmol/L (ref 3.5–5.1)
Sodium: 139 mmol/L (ref 135–145)

## 2017-05-18 MED ORDER — SACUBITRIL-VALSARTAN 24-26 MG PO TABS: 1.0000 | ORAL_TABLET | Freq: Two times a day (BID) | ORAL | 11 refills | Status: DC

## 2017-05-18 MED ORDER — FUROSEMIDE 40 MG PO TABS: 40.0000 mg | ORAL_TABLET | Freq: Every day | ORAL | 11 refills | Status: DC | PRN

## 2017-05-18 NOTE — Progress Notes (Signed)
  Echocardiogram 2D Echocardiogram has been performed.  Reginald Hahn 05/18/2017, 9:49 AM

## 2017-05-18 NOTE — Progress Notes (Signed)
Advanced Heart Failure Clinic Note   Patient ID: Reginald Hahn, male   DOB: 1950/06/24, 67 y.o.   MRN: [id number] Primary Cardiologist: Dr. Gala Romney   HPI: Reginald Hahn is a 67 y.o. male with h/o COPD admitted in 6/17 with acute systolic HF, AF with RVR and cardiogenic shock. LBBB  Patient was admitted 6/17 with acute systolic HF and new onset afib with RVR and EF found to be 15%.Felt to to have viral CM exacerbated by afib. Diuresed slowly so PICC placed and co-ox low. Started on IV amio and milrinone and subsequently diuresed well. Was pending cath and TEE/DC-CV but developed massive RP bleed and was transferred to ICU.   All anti-coagulants held. Received multiple transfusions. Subsequently recovered. During that time also had loss of peripheral vision in R eye. Head CT negative. Pre discharge had Myoview with EF 30-44%.  Inferior infarct vs gut attenuation. No ischemia.    Pulmonary Clinic with Dr. Jamison Neighbor and told he has severe COPD FEV1 1.83 (49%) FEF 25-75% 0.60L  DLCO 42%.   Pt admitted from 12/17 -> 04/10/17 with complicated admission consisting of AF with RVR, low output CHF, and URI. Pt transiently required milrinone.  After long discussion pt agreed to be placed on Eliquis, so was then candidate for DCCV with TEE. Pt failed DCCV 04/05/18 initially, but then converted to NSR overnight on IV amidoarone. Pt continued to go in-and-out of Afib throughout his admission. Decision made to send home on continued Amiodarone. Tolerated milrinone wean.   He presents today for regular follow up. Feeling great since last visit. BP starting to run higher at home. He denies any SOB, and this is the best he has felt in a while.  He states he has stopped taking his inhalers and has a pulm appointment next week. Denies lightheadedness or dizziness. No orthopnea or CP.No palpitations. No bleeding on eliquis. Weight stable at 190 at home.   Echo today (Personally reviewed) EF 25-30% and normal RV  function   ECG today shows 63 bpm, Sinus rhythm with 1st degree AV block. LBBB QRS 144 ms  Myoview 6/17  There was no ST segment deviation noted during stress.  The left ventricular ejection fraction is moderately decreased (30-44%).  Findings consistent with prior myocardial infarction.  This is an intermediate risk study.  nferior/inferolateral defect (moderate) that does not improve in the rest images consistent with scar and possible soft tissue attenuation. No significant ischemia.  RHC/LHC 08/29/2016 RA = 6 RV = 40/8 PA = 40/13 (24) PCW = 19 Ao = 104/82 (93) LV = 141/17 Fick cardiac output/index = 4.8/2.3 PVR = 1.0 WU SVR 1437  Ao sat = 90% PA sat = 62%, 63% Assessment: 1. Minimal non-obstructive CAD 2. Severe nonischemic CM with EF 25% by cath (EF likely underestimated on re-look likely 40%) 3. Relatively well compensated filling pressures with mild pulmonary venous HTN   Echo 8/17 EF 45% Echo 2/18 EF 45-50%  Review of systems complete and found to be negative unless listed in HPI.    Past Medical History:  Diagnosis Date  . Allergic rhinitis   . Allergy    seasonal allergies  . Arthritis   . Atrial fibrillation (HCC) 03/2017  . Black widow spider bite   . Chronic systolic heart failure (HCC) 09/16/2015   Possibly tachycardia mediated // Echo 12/18: EF 20-25, trivial AI, mild RAE, PASP 35  . COPD (chronic obstructive pulmonary disease) (HCC)   . Stroke (cerebrum) (HCC)   . Tobacco  use    x40 years. 1 ppd   SH:  Social History   Socioeconomic History  . Marital status: Divorced    Spouse name: Not on file  . Number of children: Not on file  . Years of education: Not on file  . Highest education level: Not on file  Social Needs  . Financial resource strain: Not on file  . Food insecurity - worry: Not on file  . Food insecurity - inability: Not on file  . Transportation needs - medical: Not on file  . Transportation needs - non-medical: Not on  file  Occupational History  . Not on file  Tobacco Use  . Smoking status: Former Smoker    Packs/day: 2.00    Years: 50.00    Pack years: 100.00    Types: Cigarettes    Start date: 04/14/1963    Last attempt to quit: 10/09/2015    Years since quitting: 1.6  . Smokeless tobacco: Never Used  . Tobacco comment: stopped for a couple of years in his 30s  Substance and Sexual Activity  . Alcohol use: No    Alcohol/week: 0.0 oz  . Drug use: No    Comment: Used to smoke marijuana  . Sexual activity: Not on file  Other Topics Concern  . 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 No pets currently. Remote exposure to a parrot for 2 years.     FH:  Family History  Problem Relation Age of Onset  . Heart disease Mother        died in her 27's.  . Diabetes Mother   . Prostate cancer Father        died in his 53's.  . Congenital heart disease Brother        died @ age 49.  . Lung disease Neg Hx     Past Medical History:  Diagnosis Date  . Allergic rhinitis   . Allergy    seasonal allergies  . Arthritis   . Atrial fibrillation (HCC) 03/2017  . Black widow spider bite   . Chronic systolic heart failure (HCC) 09/16/2015   Possibly tachycardia mediated // Echo 12/18: EF 20-25, trivial AI, mild RAE, PASP 35  . COPD (chronic obstructive pulmonary disease) (HCC)   . Stroke (cerebrum) (HCC)   . Tobacco use    x40 years. 1 ppd    Current Outpatient Medications  Medication Sig Dispense Refill  . acetaminophen (TYLENOL) 325 MG tablet Take 650 mg by mouth every 6 (six) hours as needed for mild pain or fever.    Marland Kitchen albuterol (PROVENTIL HFA;VENTOLIN HFA) 108 (90 Base) MCG/ACT inhaler Inhale 2 puffs into the lungs every 4  (four) hours as needed for wheezing or shortness of breath. 1 Inhaler 5  . amiodarone (PACERONE) 200 MG tablet Take 2 tabs twice daily for 5 days, then 1 tab twice daily for 7 days, then 1 tab daily. 40 tablet 11  . apixaban (ELIQUIS) 5 MG TABS tablet Take 1 tablet (5 mg total) by mouth 2 (two) times daily. 60 tablet 11  . budesonide-formoterol (SYMBICORT) 160-4.5 MCG/ACT inhaler Inhale 2 puffs 2 (two) times daily into the lungs. 1 Inhaler  6  . cetirizine (ZYRTEC) 10 MG tablet Take 10 mg by mouth daily.    . digoxin (LANOXIN) 0.125 MG tablet Take 1 tablet (0.125 mg total) by mouth daily. 30 tablet 11  . furosemide (LASIX) 40 MG tablet Take 1 tablet (40 mg total) by mouth daily. 30 tablet 11  . losartan (COZAAR) 25 MG tablet Take 0.5 tablets (12.5 mg total) by mouth daily. 15 tablet 11  . Multiple Vitamin (MULTIVITAMIN WITH MINERALS) TABS tablet Take 1 tablet by mouth daily.    Marland Kitchen omeprazole (PRILOSEC) 20 MG capsule Take 20 mg by mouth every morning.    . OXYGEN Inhale 1 L into the lungs as needed (Shortness of breath).    . potassium chloride SA (K-DUR,KLOR-CON) 20 MEQ tablet Take 1 tablet (20 mEq total) by mouth daily. 30 tablet 11  . sodium chloride (OCEAN) 0.65 % SOLN nasal spray Place 1 spray into both nostrils as needed for congestion.    Marland Kitchen spironolactone (ALDACTONE) 25 MG tablet Take 0.5 tablets (12.5 mg total) by mouth daily. 15 tablet 11  . Tiotropium Bromide Monohydrate (SPIRIVA RESPIMAT) 2.5 MCG/ACT AERS Inhale 2 puffs into the lungs daily. 1 Inhaler 3   No current facility-administered medications for this encounter.    Vitals:   05/18/17 1021  BP: (!) 152/98  Pulse: 76  SpO2: 94%  Weight: 193 lb 12.8 oz (87.9 kg)     Wt Readings from Last 3 Encounters:  05/18/17 193 lb 12.8 oz (87.9 kg)  04/17/17 195 lb 4 oz (88.6 kg)  04/10/17 192 lb 3.2 oz (87.2 kg)    PHYSICAL EXAM: General: Well appearing. No resp difficulty. HEENT: Normal Neck: Supple. JVP 5-6. Carotids 2+ bilat; no  bruits. No thyromegaly or nodule noted. Cor: PMI nondisplaced. Irregularly irregular, No M/G/R noted Lungs: CTAB, normal effort. Mildly decreased throughout. No wheezes  Abdomen: Soft, non-tender, non-distended, no HSM. No bruits or masses. +BS  Extremities: No cyanosis, clubbing, or rash. R and LLE no edema.  Neuro: Alert & orientedx3, cranial nerves grossly intact. moves all 4 extremities w/o difficulty. Affect pleasant   EKG NSR 63 bpm with 1st degree AV block, LBB QRS 144 ms  ASSESSMENT & PLAN:  1. Chronic Systolic HF due to tachy-induced CM in the setting of recurrent AF in setting of viral illness - Echo 03/28/2017 LVEF ~20%, worsened from Echo 2/18 EF 45-50% in setting of Afib RVR.  - Echo today with EF ~25-30%, personally reviewed by Dr. Gala Romney.  - NYHA II currently.  - Volume status stable on exam. - Stop losartan. Start Entresto 24/26 mg BID. BMET today and 7-10 days.  - Stop scheduled lasix. Reviewed sliding scale diuretics. Should take lasix 40 mg as needed.  - Continue spiro 12.5 mg daily.  - Continue digoxin 0.125 mg daily - No BB with recent low output - Reinforced fluid restriction to < 2 L daily, sodium restriction to less than 2000 mg daily, and the importance of daily weights.   2. PAF  - Continue amiodarone 200 mg BID. He is currently in NSR by EKG. Decrease to 200 mg daily at next visit.  - Rate controlled. He seems to be going in and out of Afib by history and observation in hospital.  - Now on Eliquis without bleeding.  - Could consider AF ablation down the road if worsens.  3. h/o RP bleed - No s/s of bleeding on Eliquis.  4. COPD - Sees Dr. Sherene Sires next week.  - Continue Spiriva.  Limit ventolin to as needed. Encouraged him to take as directed. - Not smoking. No change.  5. H/o transient loss of R peripheral vision - resolved  - Concerning for embolic CVA in setting of AF - Pt declines Neuro referral, MRI, and/or carotid dopplers. No change.  - Now on  Eliquis as above.  6. HTN - Elevated. Meds as above.  7. LBBB - QRS 144 on EKG today.   Meds and labs as above. RTC 4 weeks.   Graciella Freer, PA-C  10:38 AM 05/18/17   Patient seen and examined with the above-signed Advanced Practice Provider and/or Housestaff. I personally reviewed laboratory data, imaging studies and relevant notes. I independently examined the patient and formulated the important aspects of the plan. I have edited the note to reflect any of my changes or salient points. I have personally discussed the plan with the patient and/or family.  Feels much better. NYHA II. Volume status looks good. Echo reviewed personally EF 25-30% which is mildly improved from previous. RV function better as well. BP now improving. Will switch losartan to Entresto. Continue amio and Eliquis as above. If EF does not improve with suppression of AF consider CRT-D.   Arvilla Meres, MD  1:48 PM

## 2017-05-18 NOTE — Patient Instructions (Addendum)
STOP Losartan.  START Entresto 24-26 mg tablets TWICE daily. Use 30 day free card first.  DECREASE Lasix to 1 tablet once daily AS NEEDED for swelling/weight gain/shortness of breath.  Routine lab work today. Will notify you of abnormal results, otherwise no news is good news!  Return in 1-2 weeks for repeat labs.  _________________________________________________________ [place] Code: 9001    Follow up 4 weeks with Otilio Saber PA-C.  __________________________________________________________ [place] Code: 9002  Take all medication as prescribed the day of your appointment. Bring all medications with you to your appointment.  Do the following things EVERYDAY: 1) Weigh yourself in the morning before breakfast. Write it down and keep it in a log. 2) Take your medicines as prescribed 3) Eat low salt foods-Limit salt (sodium) to 2000 mg per day.  4) Stay as active as you can everyday 5) Limit all fluids for the day to less than 2 liters

## 2017-05-21 ENCOUNTER — Telehealth (HOSPITAL_COMMUNITY): Payer: Self-pay | Admitting: Pharmacist

## 2017-05-21 NOTE — Telephone Encounter (Signed)
Entresto PA approved by [facility] Part D through 05/18/19.   Reginald Hahn. Bonnye Fava, PharmD, BCPS, CPP Clinical Pharmacist Phone: [phone] 05/21/2017 11:18 AM

## 2017-05-25 ENCOUNTER — Ambulatory Visit (INDEPENDENT_AMBULATORY_CARE_PROVIDER_SITE_OTHER)
Admission: RE | Admit: 2017-05-25 | Discharge: 2017-05-25 | Disposition: A | Discharge status: Home / Self Care | Payer: Medicare Other | Source: Ambulatory Visit | Attending: Internal Medicine | Admitting: Internal Medicine

## 2017-05-25 ENCOUNTER — Ambulatory Visit (INDEPENDENT_AMBULATORY_CARE_PROVIDER_SITE_OTHER): Payer: Medicare Other | Admitting: Internal Medicine

## 2017-05-25 ENCOUNTER — Encounter: Payer: Self-pay | Admitting: Internal Medicine

## 2017-05-25 VITALS — BP 128/74 | HR 62 | Ht 72.0 in | Wt 196.6 lb

## 2017-05-25 DIAGNOSIS — E8801 Alpha-1-antitrypsin deficiency: Secondary | ICD-10-CM | POA: Diagnosis not present

## 2017-05-25 DIAGNOSIS — J449 Chronic obstructive pulmonary disease, unspecified: Secondary | ICD-10-CM

## 2017-05-25 DIAGNOSIS — Z148 Genetic carrier of other disease: Secondary | ICD-10-CM

## 2017-05-25 NOTE — Progress Notes (Signed)
Subjective:    Patient ID: Reginald Hahn, male    DOB: [birth year], 67 y.o.   MRN: [id number]    Brief patient profile:  20 yowm Reginald Hahn quit smoking 10/2015  With GOLD III copd     History of Present Illness  08/31/2016  f/u ov/Reginald Hahn re: Reginald Hahn/ COPD Gold  III flare of cough > sob x 2 weeks/ Chief Complaint  Patient presents with  . Acute Visit    Increased cough x 2 wks. He is coughing the the point his chest feels sore. He occ produces minima clear sputum. He states he occ coughs until the point the he gets dizzy and passes out. He is using albuterol inhaler 3-4 x per day.   cough had improved on trelegy then gradually worse cough esp at hs  Most dry but quite severe  Doe = MMRC2 = can't walk a nl pace on a flat grade s sob but does fine slow and flat eg shopping rec  Try prilosec otc 20mg   Take 30-60 min before first meal of the day and Pepcid ac (famotidine) 20 mg one @  Bedtime GERD diet  For cough > mucinex dm 1200 mg twice daily as needed and flutter valve when we get one we'll call to train you on it Stop trelegy and start symbicort 80 Take 2 puffs first thing in am and then another 2 puffs about 12 hours later.     05/25/2017  f/u ov/Reginald Hahn re:  Transition of care from  [name] Neighbor no longer on maint rx  Chief Complaint  Patient presents with  . Follow-up    COPD-No SOB, slight cough, no wheezing  Dyspnea:  MMRC2 = can't walk a nl pace on a flat grade s sob but does fine slow and flat anywhere he wants to go   Cough: variable min am mucoid  Sleep: no longer on 02 / flat    Does not use any inhalers as maint/ proair maybe once every few weeks   No obvious day to day or daytime variability or assoc excess/ purulent sputum or mucus plugs or hemoptysis or cp or chest tightness, subjective wheeze or overt sinus or hb symptoms. No unusual exposure hx or h/o childhood pna/ asthma or knowledge of premature birth.  Sleeping ok flat without nocturnal  or early am exacerbation  of respiratory   c/o's or need for noct saba. Also denies any obvious fluctuation of symptoms with weather or environmental changes or other aggravating or alleviating factors except as outlined above   Current Allergies, Complete Past Medical History, Past Surgical History, Family History, and Social History were reviewed in [name] record.  ROS  The following are not active complaints unless bolded Hoarseness, sore throat, dysphagia, dental problems, itching, sneezing,  nasal congestion or discharge of excess mucus or purulent secretions, ear ache,   fever, chills, sweats, unintended wt loss or wt gain, classically pleuritic or exertional cp,  orthopnea pnd or leg swelling, presyncope, palpitations, abdominal pain, anorexia, nausea, vomiting, diarrhea  or change in bowel habits or change in bladder habits, change in stools or change in urine, dysuria, hematuria,  rash, arthralgias, visual complaints, headache, numbness, weakness or ataxia or problems with walking or coordination,  change in mood/affect or memory.        Current Meds  Medication Sig  . acetaminophen (TYLENOL) 325 MG tablet Take 650 mg by mouth every 6 (six) hours as needed for mild pain or fever.  Marland Kitchen amiodarone (  PACERONE) 200 MG tablet Take 2 tabs twice daily for 5 days, then 1 tab twice daily for 7 days, then 1 tab daily. (Patient taking differently: Take 200 mg by mouth daily. 1 tab daily.)  . apixaban (ELIQUIS) 5 MG TABS tablet Take 1 tablet (5 mg total) by mouth 2 (two) times daily.                          Objective:   Physical Exam   Pleasant amb wm nad    05/25/2017      196   08/31/16 201 lb (91.2 kg)  08/29/16 202 lb (91.6 kg)  08/25/16 201 lb 4 oz (91.3 kg)     Vital signs reviewed - Note on arrival 02 sats  92% on RA    A few incisors/ top gone  HEENT: nl   turbinates bilaterally, and oropharynx. Nl external ear canals without cough reflex - very poor dentition / upper teeth all gone and  lower ncisors severely decayed  NECK :  without JVD/Nodes/TM/ nl carotid upstrokes bilaterally   LUNGS: no acc muscle use,   barrel contour chest wall with bilateral  Distant bs withouth wheeze/   cough on insp or exp maneuver and  Hyper resonant  to  percussion bilaterally    CV:  RRR  no s3 or murmur or increase in P2, and no edema   ABD:  soft and nontender with nl inspiratory excursion in the supine position. No bruits or organomegaly appreciated, bowel sounds nl  MS:  Nl gait/ ext warm without deformities, calf tenderness, cyanosis or clubbing No obvious joint restrictions   SKIN: warm and dry without lesions    NEURO:  alert, approp, nl sensorium with  no motor or cerebellar deficits apparent.             LABS 06/12/16 IgE: 60 RAST panel: Negative       CXR PA and Lateral:   05/25/2017 :    I personally reviewed images and agree with radiology impression as follows:     COPD.  No active disease.     Assessment & Plan:

## 2017-05-25 NOTE — Patient Instructions (Addendum)
If you start needing your rescue inhaler more than twice daily>>>  then start back on dulera 100 Take up to  2 puffs first thing in am and then another 2 puffs about 12 hours later.    Please remember to go to the  x-ray department downstairs in the basement  for your tests - we will call you with the results when they are available.        Please schedule a follow up visit in  6  months but call sooner if needed  with all medications /inhalers/ solutions in hand so we can verify exactly what you are taking. This includes all medications from all doctors and over the counters

## 2017-05-25 NOTE — Assessment & Plan Note (Addendum)
Quit smoking 10/2015  PFT 06/15/16: FVC 4.05 L (92%) FEV1 1.33 L (49%) FEV1/FVC 0.45 and dlco corrected 45%   Alpha one 08/01/16:   Level 99/ MZ   - Spirometry 05/25/2017  FEV1 2.09 (57%)  Ratio 50 with classic curvature and no rx prior  -  05/25/2017  Walked RA x 3 laps @ 185 ft each stopped due to  End of study, nl pace, no sob or desat     - The proper method of use, as well as anticipated side effects, of a metered-dose inhaler are discussed and demonstrated to the patient.      I reviewed the Fletcher curve with the patient that basically indicates  if you quit smoking when your best day FEV1 is still well preserved (as is clearly  the case here)  it is highly unlikely you will progress to severe disease and informed the patient there was  no medication on the market that has proven to alter the curve/ its downward trajectory  or the likelihood of progression of their disease(unlike other chronic medical conditions such as atheroclerosis where we do think we can change the natural hx with risk reducing meds)    Therefore stopping smoking and maintaining abstinence are  the most important aspects of care, not choice of inhalers or for that matter, doctors.   Treatment other than smoking cessation  is entirely directed by severity of symptoms and focused also on reducing exacerbations, not attempting to change the natural history of the disease.  ie it's fine to just use saba prn in this setting unless he starts needing more than bid dosing in which case he could be tried back on dulera 100 up to 2 q 12 h as a first step and then return here if not satisfied for trial back on lama/laba.   I had an extended discussion with the patient reviewing all relevant studies completed to date and  lasting 15 to 20 minutes of a 25 minute visit    Each maintenance medication was reviewed in detail including most importantly the difference between maintenance and prns and under what circumstances the prns are to  be triggered using an action plan format that is not reflected in the computer generated alphabetically organized AVS.    Please see AVS for specific instructions unique to this visit that I personally wrote and verbalized to the the pt in detail and then reviewed with pt  by my nurse highlighting any  changes in therapy recommended at today's visit to their plan of care.

## 2017-05-26 ENCOUNTER — Encounter: Payer: Self-pay | Admitting: Internal Medicine

## 2017-05-26 NOTE — Assessment & Plan Note (Signed)
No evidence of significant alpha one def clinically noting that his pfts are actually improving serially s/p smoking cessation in 2017

## 2017-05-28 ENCOUNTER — Other Ambulatory Visit (HOSPITAL_COMMUNITY): Payer: Medicare Other

## 2017-06-01 ENCOUNTER — Telehealth (HOSPITAL_COMMUNITY): Payer: Self-pay | Admitting: Cardiac Rehabilitation

## 2017-06-01 NOTE — Telephone Encounter (Signed)
-----   Message from Dolores Patty, MD sent at 06/01/2017  8:51 AM EST ----- Regarding: RE: cardiac rehab  Yes. Thank you  ----- Message ----- From: Robyne Peers, RN Sent: 05/31/2017   3:30 PM To: Dolores Patty, MD Subject: cardiac rehab                                  Dear Dr. Gala Romney,  Pt reports he has history of home oxygen use, however not currently using.  Is it ok if we use oxygen titrate to keep 02 sat>94% at cardiac rehab?  Thank you, Deveron Furlong, RN, BSN Cardiac Pulmonary Rehab

## 2017-06-04 ENCOUNTER — Telehealth (HOSPITAL_COMMUNITY): Payer: Self-pay

## 2017-06-04 NOTE — Telephone Encounter (Addendum)
Cardiac Rehab Medication Review by a Pharmacist  Does the patient feel that his/her medications are working for him/her?  Yes - says he feels great lately. Has energy and feels it's been a great contrast to his previous lifestyle.  Has the patient been experiencing any side effects to the medications prescribed?  no  Does the patient measure his/her own blood pressure or blood glucose at home?  yes - checks his BP, pulse, and O2 every morning. Says his BP has been running slightly high this week but has had some family stress. His pulse has ranged from 62-65 the past week and his oxygen has been >95 with one 93 in the past week. I encouraged him to continue monitoring.  Does the patient have any problems obtaining medications due to transportation or finances?   Yes - Sherryll Burger was too expensive for him so he is NO LONGER TAKING. Eliquis is beginning to be slightly too expensive for him as well. He stated he would continue to take the Eliquis for now, but will notify us if he can no longer afford it before he stops taking it.  Understanding of regimen: good Understanding of indications: good Potential of compliance: good  Pharmacist comments:  -Sherryll Burger was too expensive for him so he is NO LONGER TAKING. He is taking losartan instead. -Eliquis is beginning to be slightly too expensive for him as well. He stated he would continue to take the Eliquis for now, but will notify us if he can no longer afford it before he stops taking it. -He is not taking his inhalers regularly, he states he has a new pulmonologist and he told him to only take his inhalers as needed and he feels like he doesn't really need them anymore. -He is no longer using his oxygen daily.   Diana L. Marcy Salvo, PharmD, MS PGY1 Pharmacy Resident Pager: [phone]

## 2017-06-05 ENCOUNTER — Encounter (HOSPITAL_COMMUNITY): Payer: Self-pay

## 2017-06-05 ENCOUNTER — Encounter (HOSPITAL_COMMUNITY)
Admission: RE | Admit: 2017-06-05 | Discharge: 2017-06-05 | Disposition: A | Discharge status: Home / Self Care | Payer: Medicare Other | Source: Ambulatory Visit | Attending: Internal Medicine | Admitting: Internal Medicine

## 2017-06-05 VITALS — BP 138/82 | HR 74 | Ht 71.5 in | Wt 197.1 lb

## 2017-06-05 DIAGNOSIS — I5022 Chronic systolic (congestive) heart failure: Secondary | ICD-10-CM

## 2017-06-05 HISTORY — DX: Heart failure, unspecified: I50.9

## 2017-06-05 HISTORY — DX: Essential (primary) hypertension: I10

## 2017-06-05 NOTE — Progress Notes (Signed)
Reginald Hahn 66 y.o. male DOB: 1950-09-21 MRN: [id number]      Nutrition Note  1. Chronic systolic congestive heart failure (HCC)    Past Medical History:  Diagnosis Date  . Allergic rhinitis   . Allergy    seasonal allergies  . Arthritis   . Atrial fibrillation (HCC) 03/2017  . Black widow spider bite   . Chronic systolic heart failure (HCC) 09/16/2015   Possibly tachycardia mediated // Echo 12/18: EF 20-25, trivial AI, mild RAE, PASP 35  . COPD (chronic obstructive pulmonary disease) (HCC)   . Stroke (cerebrum) (HCC)   . Tobacco use    x40 years. 1 ppd   Meds reviewed.  HT: Ht Readings from Last 1 Encounters:  05/25/17 6' (1.829 m)    WT: Wt Readings from Last 3 Encounters:  05/25/17 196 lb 9.6 oz (89.2 kg)  05/18/17 193 lb 12.8 oz (87.9 kg)  04/17/17 195 lb 4 oz (88.6 kg)     BMI 27.1   Current tobacco use? No   Labs:  Lipid Panel     Component Value Date/Time   CHOL 131 09/16/2015 0218   TRIG 71 09/16/2015 0218   HDL 24 (L) 09/16/2015 0218   CHOLHDL 5.5 09/16/2015 0218   VLDL 14 09/16/2015 0218   LDLCALC 93 09/16/2015 0218    Lab Results  Component Value Date   HGBA1C 5.9 (H) 09/15/2015   CBG (last 3)  No results for input(s): GLUCAP in the last 72 hours.  Nutrition Note Spoke with pt. Nutrition plan and goals reviewed with pt. Pt is following Step 1 of the Therapeutic Lifestyle Changes diet. Pt wants to lose wt. Pt has been trying to lose wt by watching portion sizes. Wt loss tips reviewed.  Pt is pre-diabetic according to his last A1c. Pt aware of pre-diabetes dx. Pt reports a family h/o DM. Pt states he has difficulty buying food. Community food resources discussed. Pt expressed understanding of the information reviewed. Pt aware of nutrition education classes offered.  Nutrition Diagnosis ? Food-and nutrition-related knowledge deficit related to lack of exposure to information as related to diagnosis of: ? CVD ? Pre-DM ? Overweight related to  excessive energy intake as evidenced by a BMI of 27.11  Nutrition Intervention ? Pt's individual nutrition plan and goals reviewed with pt.  Nutrition Goal(s):  ? Pt to identify and limit food sources of saturated fat, trans fat, and sodium ? Pt to identify food quantities necessary to achieve weight loss of 6-22 lb (2.7-10.9 kg) at graduation from cardiac rehab. Goal wt of 175-180 lb desired.   Plan:  Pt to attend nutrition classes ? Nutrition I ? Nutrition II ? Portion Distortion  Will provide client-centered nutrition education as part of interdisciplinary care.   Monitor and evaluate progress toward nutrition goal with team.  Mickle Plumb, M.Ed, RD, LDN, CDE 06/05/2017 9:15 AM

## 2017-06-05 NOTE — Progress Notes (Signed)
Cardiac Individual Treatment Plan  Patient Details  Name: Reginald Hahn MRN: [id number] Date of Birth: [birth year] Referring Provider:     CARDIAC REHAB PHASE II ORIENTATION from 06/05/2017 in [facility] CARDIAC [facility]  Referring Provider  Arvilla Meres MD      Initial Encounter Date:    CARDIAC REHAB PHASE II ORIENTATION from 06/05/2017 in [facility] CARDIAC REHAB  Date  06/05/17  Referring Provider  Arvilla Meres MD      Visit Diagnosis: Chronic systolic congestive heart failure (HCC)  Patient's Home Medications on Admission:  Current Outpatient Medications:  .  acetaminophen (TYLENOL) 325 MG tablet, Take 650 mg by mouth every 6 (six) hours as needed for mild pain or fever., Disp: , Rfl:  .  albuterol (PROVENTIL HFA;VENTOLIN HFA) 108 (90 Base) MCG/ACT inhaler, Inhale 2 puffs into the lungs every 4 (four) hours as needed for wheezing or shortness of breath. (Patient not taking: Reported on 05/25/2017), Disp: 1 Inhaler, Rfl: 5 .  amiodarone (PACERONE) 200 MG tablet, Take 2 tabs twice daily for 5 days, then 1 tab twice daily for 7 days, then 1 tab daily. (Patient taking differently: Take 200 mg by mouth daily. 1 tab daily.), Disp: 40 tablet, Rfl: 11 .  apixaban (ELIQUIS) 5 MG TABS tablet, Take 1 tablet (5 mg total) by mouth 2 (two) times daily., Disp: 60 tablet, Rfl: 11 .  budesonide-formoterol (SYMBICORT) 160-4.5 MCG/ACT inhaler, Inhale 2 puffs 2 (two) times daily into the lungs. (Patient not taking: Reported on 05/25/2017), Disp: 1 Inhaler, Rfl: 6 .  cetirizine (ZYRTEC) 10 MG tablet, Take 10 mg by mouth daily., Disp: , Rfl:  .  digoxin (LANOXIN) 0.125 MG tablet, Take 1 tablet (0.125 mg total) by mouth daily., Disp: 30 tablet, Rfl: 11 .  furosemide (LASIX) 40 MG tablet, Take 1 tablet (40 mg total) by mouth daily as needed for fluid or edema., Disp: 30 tablet, Rfl: 11 .  losartan (COZAAR) 25 MG tablet, Take 25 mg by mouth daily., Disp: , Rfl:  .   mometasone-formoterol (DULERA) 100-5 MCG/ACT AERO, Inhale 2 puffs into the lungs 2 (two) times daily., Disp: , Rfl:  .  Multiple Vitamin (MULTIVITAMIN WITH MINERALS) TABS tablet, Take 1 tablet by mouth daily., Disp: , Rfl:  .  omeprazole (PRILOSEC) 20 MG capsule, Take 20 mg by mouth every morning., Disp: , Rfl:  .  OXYGEN, Inhale 1 L into the lungs as needed (Shortness of breath)., Disp: , Rfl:  .  potassium chloride SA (K-DUR,KLOR-CON) 20 MEQ tablet, Take 1 tablet (20 mEq total) by mouth daily., Disp: 30 tablet, Rfl: 11 .  sacubitril-valsartan (ENTRESTO) 24-26 MG, Take 1 tablet by mouth 2 (two) times daily. (Patient not taking: Reported on 06/04/2017), Disp: 60 tablet, Rfl: 11 .  spironolactone (ALDACTONE) 25 MG tablet, Take 0.5 tablets (12.5 mg total) by mouth daily., Disp: 15 tablet, Rfl: 11  Past Medical History: Past Medical History:  Diagnosis Date  . Allergic rhinitis   . Allergy    seasonal allergies  . Arthritis   . Atrial fibrillation (HCC) 03/2017  . Black widow spider bite   . CHF (congestive heart failure) (HCC)   . Chronic systolic heart failure (HCC) 09/16/2015   Possibly tachycardia mediated // Echo 12/18: EF 20-25, trivial AI, mild RAE, PASP 35  . COPD (chronic obstructive pulmonary disease) (HCC)   . Hypertension   . Stroke (cerebrum) (HCC)   . Tobacco use    x40 years. 1 ppd  Tobacco Use: Social History   Tobacco Use  Smoking Status Former Smoker  . Packs/day: 2.00  . Years: 50.00  . Pack years: 100.00  . Types: Cigarettes  . Start date: 04/14/1963  . Last attempt to quit: 10/09/2015  . Years since quitting: 1.6  Smokeless Tobacco Never Used  Tobacco Comment   stopped for a couple of years in his 30s    Labs: Recent Review Flowsheet Data    Labs for ITP Cardiac and Pulmonary Rehab Latest Ref Rng & Units 04/06/2017 04/07/2017 04/08/2017 04/09/2017 04/10/2017   Cholestrol 0 - 200 mg/dL - - - - -   LDLCALC 0 - 99 mg/dL - - - - -   HDL >01 mg/dL - - - - -    Trlycerides <150 mg/dL - - - - -   Hemoglobin A1c 4.8 - 5.6 % - - - - -   PHART 7.350 - 7.450 - - - - -   PCO2ART 32.0 - 48.0 mmHg - - - - -   HCO3 20.0 - 28.0 mmol/L - - - - -   TCO2 0 - 100 mmol/L - - - - -   ACIDBASEDEF 0.0 - 2.0 mmol/L - - - - -   O2SAT % 54.6 58.5 54.9 55.2 63.6      Capillary Blood Glucose: Lab Results  Component Value Date   GLUCAP 152 (H) 03/29/2017   GLUCAP 83 09/28/2015   GLUCAP 88 09/28/2015   GLUCAP 114 (H) 09/27/2015   GLUCAP 105 (H) 09/27/2015     Exercise Target Goals: Date: 06/05/17  Exercise Program Goal: Individual exercise prescription set using results from initial 6 min walk test and THRR while considering  patient's activity barriers and safety.   Exercise Prescription Goal: Initial exercise prescription builds to 30-45 minutes a day of aerobic activity, 2-3 days per week.  Home exercise guidelines will be given to patient during program as part of exercise prescription that the participant will acknowledge.  Activity Barriers & Risk Stratification: Activity Barriers & Cardiac Risk Stratification - 06/05/17 0929      Activity Barriers & Cardiac Risk Stratification   Activity Barriers  Back Problems;Deconditioning;Muscular Weakness;Other (comment)    Comments  L knee pain, R LE numbness from stroke    Cardiac Risk Stratification  High       6 Minute Walk: 6 Minute Walk    Row Name 06/05/17 1029         6 Minute Walk   Phase  Initial     Distance  1236 feet     Walk Time  6 minutes     # of Rest Breaks  0     MPH  2.3     METS  3.2     RPE  12     Perceived Dyspnea   1     VO2 Peak  11.4     Symptoms  Yes (comment)     Comments  SOB- mild      Resting HR  74 bpm     Resting BP  138/82     Resting Oxygen Saturation   96 %     Exercise Oxygen Saturation  during 6 min walk  96 %     Max Ex. HR  100 bpm     Max Ex. BP  154/84     2 Minute Post BP  124/70        Oxygen Initial Assessment:   Oxygen  Re-Evaluation:  Oxygen Discharge (Final Oxygen Re-Evaluation):   Initial Exercise Prescription: Initial Exercise Prescription - 06/05/17 1000      Date of Initial Exercise RX and Referring Provider   Date  06/05/17    Referring Provider  Arvilla Meres MD      Bike   Level  0.5    Minutes  10    METs  2.05      NuStep   Level  3    SPM  70    Minutes  10    METs  2      Track   Laps  10    Minutes  10    METs  2.4      Prescription Details   Frequency (times per week)  3    Duration  Progress to 30 minutes of continuous aerobic without signs/symptoms of physical distress      Intensity   THRR 40-80% of Max Heartrate  62-123    Ratings of Perceived Exertion  11-15    Perceived Dyspnea  0-4      Progression   Progression  Continue to progress workloads to maintain intensity without signs/symptoms of physical distress.      Resistance Training   Training Prescription  Yes    Weight  3lbs    Reps  10-15       Perform Capillary Blood Glucose checks as needed.  Exercise Prescription Changes:   Exercise Comments:   Exercise Goals and Review:  Exercise Goals    Row Name 06/05/17 0929             Exercise Goals   Increase Physical Activity  Yes       Intervention  Provide advice, education, support and counseling about physical activity/exercise needs.;Develop an individualized exercise prescription for aerobic and resistive training based on initial evaluation findings, risk stratification, comorbidities and participant's personal goals.       Expected Outcomes  Short Term: Attend rehab on a regular basis to increase amount of physical activity.;Long Term: Exercising regularly at least 3-5 days a week.;Long Term: Add in home exercise to make exercise part of routine and to increase amount of physical activity.       Increase Strength and Stamina  Yes improve walking tolerance and confidence with exercise       Intervention  Provide advice, education,  support and counseling about physical activity/exercise needs.;Develop an individualized exercise prescription for aerobic and resistive training based on initial evaluation findings, risk stratification, comorbidities and participant's personal goals.       Expected Outcomes  Short Term: Increase workloads from initial exercise prescription for resistance, speed, and METs.;Short Term: Perform resistance training exercises routinely during rehab and add in resistance training at home;Long Term: Improve cardiorespiratory fitness, muscular endurance and strength as measured by increased METs and functional capacity ( )       Able to understand and use rate of perceived exertion (RPE) scale  Yes       Intervention  Provide education and explanation on how to use RPE scale       Expected Outcomes  Short Term: Able to use RPE daily in rehab to express subjective intensity level;Long Term:  Able to use RPE to guide intensity level when exercising independently       Knowledge and understanding of Target Heart Rate Range (THRR)  Yes       Intervention  Provide education and explanation of THRR including how the numbers were predicted and where  they are located for reference       Expected Outcomes  Short Term: Able to state/look up THRR;Long Term: Able to use THRR to govern intensity when exercising independently;Short Term: Able to use daily as guideline for intensity in rehab       Able to check pulse independently  Yes       Intervention  Provide education and demonstration on how to check pulse in carotid and radial arteries.;Review the importance of being able to check your own pulse for safety during independent exercise       Expected Outcomes  Short Term: Able to explain why pulse checking is important during independent exercise;Long Term: Able to check pulse independently and accurately       Understanding of Exercise Prescription  Yes       Intervention  Provide education, explanation, and written  materials on patient's individual exercise prescription       Expected Outcomes  Long Term: Able to explain home exercise prescription to exercise independently;Short Term: Able to explain program exercise prescription          Exercise Goals Re-Evaluation :    Discharge Exercise Prescription (Final Exercise Prescription Changes):   Nutrition:  Target Goals: Understanding of nutrition guidelines, daily intake of sodium 1500mg , cholesterol 200mg , calories 30% from fat and 7% or less from saturated fats, daily to have 5 or more servings of fruits and vegetables.  Biometrics: Pre Biometrics - 06/05/17 1034      Pre Biometrics   Height  5' 11.5" (1.816 m)    Weight  197 lb 1.5 oz (89.4 kg)    Waist Circumference  42 inches    Hip Circumference  42 inches    Waist to Hip Ratio  1 %    BMI (Calculated)  27.11    Triceps Skinfold  17 mm    % Body Fat  28.4 %    Grip Strength  43 kg    Flexibility  13 in    Single Leg Stand  10 seconds        Nutrition Therapy Plan and Nutrition Goals: Nutrition Therapy & Goals - 06/05/17 1222      Nutrition Therapy   Diet  Heart Healthy      Personal Nutrition Goals   Nutrition Goal  Pt to identify and limit food sources of saturated fat, trans fat, and sodium    Personal Goal #2  Pt to identify food quantities necessary to achieve weight loss of 6-22 lb (2.7-10.9 kg) at graduation from cardiac rehab. Goal wt of 175-180 lb desired.       Intervention Plan   Intervention  Prescribe, educate and counsel regarding individualized specific dietary modifications aiming towards targeted core components such as weight, hypertension, lipid management, diabetes, heart failure and other comorbidities.    Expected Outcomes  Short Term Goal: Understand basic principles of dietary content, such as calories, fat, sodium, cholesterol and nutrients.;Long Term Goal: Adherence to prescribed nutrition plan.       Nutrition Assessments: Nutrition Assessments  - 06/05/17 1222      MEDFICTS Scores   Pre Score  62       Nutrition Goals Re-Evaluation:   Nutrition Goals Re-Evaluation:   Nutrition Goals Discharge (Final Nutrition Goals Re-Evaluation):   Psychosocial: Target Goals: Acknowledge presence or absence of significant depression and/or stress, maximize coping skills, provide positive support system. Participant is able to verbalize types and ability to use techniques and skills needed for reducing stress  and depression.  Initial Review & Psychosocial Screening: Initial Psych Review & Screening - 06/05/17 0934      Initial Review   Current issues with  None Identified      Family Dynamics   Good Support System?  Yes ex spouse, daughter       Barriers   Psychosocial barriers to participate in program  There are no identifiable barriers or psychosocial needs.      Screening Interventions   Interventions  Encouraged to exercise    Expected Outcomes  Short Term goal: Identification and review with participant of any Quality of Life or Depression concerns found by scoring the questionnaire.;Long Term goal: The participant improves quality of Life and PHQ9 Scores as seen by post scores and/or verbalization of changes       Quality of Life Scores: Quality of Life - 06/05/17 0934      Quality of Life Scores   Health/Function Pre  20.4 %    Socioeconomic Pre  19.5 %    Psych/Spiritual Pre  24.86 %    Family Pre  22.8 %    GLOBAL Pre  21.43 %      Scores of 19 and below usually indicate a poorer quality of life in these areas.  A difference of  2-3 points is a clinically meaningful difference.  A difference of 2-3 points in the total score of the Quality of Life Index has been associated with significant improvement in overall quality of life, self-image, physical symptoms, and general health in studies assessing change in quality of life.  PHQ-9: Recent Review Flowsheet Data    Depression screen [facility] 2/9 10/11/2015 09/30/2015    Decreased Interest 0 0   Down, Depressed, Hopeless 0 0   PHQ - 2 Score 0 0     Interpretation of Total Score  Total Score Depression Severity:  1-4 = Minimal depression, 5-9 = Mild depression, 10-14 = Moderate depression, 15-19 = Moderately severe depression, 20-27 = Severe depression   Psychosocial Evaluation and Intervention:   Psychosocial Re-Evaluation:   Psychosocial Discharge (Final Psychosocial Re-Evaluation):   Vocational Rehabilitation: Provide vocational rehab assistance to qualifying candidates.   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 Participant will state understanding/return demonstration of topics presented.  Learning Barriers/Preferences: Learning Barriers/Preferences - 06/05/17 1610      Learning Barriers/Preferences   Learning Barriers  Sight;Hearing hearing aids and glasses    Learning Preferences  Skilled Demonstration;Video;Written Material;Pictoral       Education Topics: Count Your Pulse:  -Group instruction provided by verbal instruction, demonstration, patient participation and written materials to support subject.  Instructors address importance of being able to find your pulse and how to count your pulse when at home without a heart monitor.  Patients get hands on experience counting their pulse with staff help and individually.   Heart Attack, Angina, and Risk Factor Modification:  -Group instruction provided by verbal instruction, video, and written materials to support subject.  Instructors address signs and symptoms of angina and heart attacks.    Also discuss risk factors for heart disease and how to make changes to improve heart  health risk factors.   Functional Fitness:  -Group  instruction provided by verbal instruction, demonstration, patient participation, and written materials to support subject.  Instructors address safety measures for doing things around the house.  Discuss how to get up and down off the floor, how to pick things up properly, how to safely get out of a chair without assistance, and balance training.   Meditation and Mindfulness:  -Group instruction provided by verbal instruction, patient participation, and written materials to support subject.  Instructor addresses importance of mindfulness and meditation practice to help reduce stress and improve awareness.  Instructor also leads participants through a meditation exercise.    Stretching for Flexibility and Mobility:  -Group instruction provided by verbal instruction, patient participation, and written materials to support subject.  Instructors lead participants through series of stretches that are designed to increase flexibility thus improving mobility.  These stretches are additional exercise for major muscle groups that are typically performed during regular warm up and cool down.   Hands Only CPR:  -Group verbal, video, and participation provides a basic overview of AHA guidelines for community CPR. Role-play of emergencies allow participants the opportunity to practice calling for help and chest compression technique with discussion of AED use.   Hypertension: -Group verbal and written instruction that provides a basic overview of hypertension including the most recent diagnostic guidelines, risk factor reduction with self-care instructions and medication management.    Nutrition I class: Heart Healthy Eating:  -Group instruction provided by PowerPoint slides, verbal discussion, and written materials to support subject matter. The instructor gives an explanation and review of the Therapeutic Lifestyle Changes diet recommendations, which  includes a discussion on lipid goals, dietary fat, sodium, fiber, plant stanol/sterol esters, sugar, and the components of a well-balanced, healthy diet.   Nutrition II class: Lifestyle Skills:  -Group instruction provided by PowerPoint slides, verbal discussion, and written materials to support subject matter. The instructor gives an explanation and review of label reading, grocery shopping for heart health, heart healthy recipe modifications, and ways to make healthier choices when eating out.   Diabetes Question & Answer:  -Group instruction provided by PowerPoint slides, verbal discussion, and written materials to support subject matter. The instructor gives an explanation and review of diabetes co-morbidities, pre- and post-prandial blood glucose goals, pre-exercise blood glucose goals, signs, symptoms, and treatment of hypoglycemia and hyperglycemia, and foot care basics.   Diabetes Blitz:  -Group instruction provided by PowerPoint slides, verbal discussion, and written materials to support subject matter. The instructor gives an explanation and review of the physiology behind type 1 and type 2 diabetes, diabetes medications and rational behind using different medications, pre- and post-prandial blood glucose recommendations and Hemoglobin A1c goals, diabetes diet, and exercise including blood glucose guidelines for exercising safely.    Portion Distortion:  -Group instruction provided by PowerPoint slides, verbal discussion, written materials, and food models to support subject matter. The instructor gives an explanation of serving size versus portion size, changes in portions sizes over the last 20 years, and what consists of a serving from each food group.   Stress Management:  -Group instruction provided by verbal instruction, video, and written materials to support subject matter.  Instructors review role of stress in heart disease and how to cope with stress positively.      Exercising on Your Own:  -Group instruction provided by verbal instruction, power point, and written materials to support subject.  Instructors discuss benefits of exercise, components of exercise, frequency and intensity of exercise, and end points for exercise.  Also  discuss use of nitroglycerin and activating EMS.  Review options of places to exercise outside of rehab.  Review guidelines for sex with heart disease.   Cardiac Drugs I:  -Group instruction provided by verbal instruction and written materials to support subject.  Instructor reviews cardiac drug classes: antiplatelets, anticoagulants, beta blockers, and statins.  Instructor discusses reasons, side effects, and lifestyle considerations for each drug class.   Cardiac Drugs II:  -Group instruction provided by verbal instruction and written materials to support subject.  Instructor reviews cardiac drug classes: angiotensin converting enzyme inhibitors (ACE-I), angiotensin II receptor blockers (ARBs), nitrates, and calcium channel blockers.  Instructor discusses reasons, side effects, and lifestyle considerations for each drug class.   Anatomy and Physiology of the Circulatory System:  Group verbal and written instruction and models provide basic cardiac anatomy and physiology, with the coronary electrical and arterial systems. Review of: AMI, Angina, Valve disease, Heart Failure, Peripheral Artery Disease, Cardiac Arrhythmia, Pacemakers, and the ICD.   Other Education:  -Group or individual verbal, written, or video instructions that support the educational goals of the cardiac rehab program.   Holiday Eating Survival Tips:  -Group instruction provided by PowerPoint slides, verbal discussion, and written materials to support subject matter. The instructor gives patients tips, tricks, and techniques to help them not only survive but enjoy the holidays despite the onslaught of food that accompanies the holidays.   Knowledge  Questionnaire Score: Knowledge Questionnaire Score - 06/05/17 0933      Knowledge Questionnaire Score   Pre Score  16/24       Core Components/Risk Factors/Patient Goals at Admission: Personal Goals and Risk Factors at Admission - 06/05/17 1222      Core Components/Risk Factors/Patient Goals on Admission    Weight Management  Yes;Weight Maintenance;Weight Loss    Intervention  Weight Management: Develop a combined nutrition and exercise program designed to reach desired caloric intake, while maintaining appropriate intake of nutrient and fiber, sodium and fats, and appropriate energy expenditure required for the weight goal.;Weight Management/Obesity: Establish reasonable short term and long term weight goals.;Weight Management: Provide education and appropriate resources to help participant work on and attain dietary goals.    Admit Weight  197 lb 1.5 oz (89.4 kg)    Goal Weight: Short Term  190 lb (86.2 kg)    Goal Weight: Long Term  185 lb (83.9 kg)    Expected Outcomes  Short Term: Continue to assess and modify interventions until short term weight is achieved;Long Term: Adherence to nutrition and physical activity/exercise program aimed toward attainment of established weight goal;Weight Maintenance: Understanding of the daily nutrition guidelines, which includes 25-35% calories from fat, 7% or less cal from saturated fats, less than 200mg  cholesterol, less than 1.5gm of sodium, & 5 or more servings of fruits and vegetables daily;Weight Loss: Understanding of general recommendations for a balanced deficit meal plan, which promotes 1-2 lb weight loss per week and includes a negative energy balance of [phone] kcal/d;Understanding recommendations for meals to include 15-35% energy as protein, 25-35% energy from fat, 35-60% energy from carbohydrates, less than 200mg  of dietary cholesterol, 20-35 gm of total fiber daily;Understanding of distribution of calorie intake throughout the day with the  consumption of 4-5 meals/snacks    Improve shortness of breath with ADL's  Yes    Intervention  Provide education, individualized exercise plan and daily activity instruction to help decrease symptoms of SOB with activities of daily living.    Expected Outcomes  Short Term: Improve cardiorespiratory  fitness to achieve a reduction of symptoms when performing ADLs;Long Term: Be able to perform more ADLs without symptoms or delay the onset of symptoms    Heart Failure  Yes    Intervention  Provide a combined exercise and nutrition program that is supplemented with education, support and counseling about heart failure. Directed toward relieving symptoms such as shortness of breath, decreased exercise tolerance, and extremity edema.    Expected Outcomes  Improve functional capacity of life;Short term: Attendance in program 2-3 days a week with increased exercise capacity. Reported lower sodium intake. Reported increased fruit and vegetable intake. Reports medication compliance.;Short term: Daily weights obtained and reported for increase. Utilizing diuretic protocols set by physician.;Long term: Adoption of self-care skills and reduction of barriers for early signs and symptoms recognition and intervention leading to self-care maintenance.    Hypertension  Yes    Intervention  Provide education on lifestyle modifcations including regular physical activity/exercise, weight management, moderate sodium restriction and increased consumption of fresh fruit, vegetables, and low fat dairy, alcohol moderation, and smoking cessation.;Monitor prescription use compliance.    Expected Outcomes  Short Term: Continued assessment and intervention until BP is < 140/41mm HG in hypertensive participants. < 130/74mm HG in hypertensive participants with diabetes, heart failure or chronic kidney disease.;Long Term: Maintenance of blood pressure at goal levels.       Core Components/Risk Factors/Patient Goals Review:    Core  Components/Risk Factors/Patient Goals at Discharge (Final Review):    ITP Comments: ITP Comments    Row Name 06/05/17 0913           ITP Comments  Dr. Armanda Magic, Medical Director           Comments: Reginald Hahn attended orientation from 0900 to 1030 to review rules and guidelines for program. Completed 6 minute walk test, Intitial ITP, and exercise prescription.  VSS. Telemetry-Sinus Rhythm with a first degree heart block and a bundle branch block. This has been previously documented. Reginald Hahn did report some shortness of breath during his walk test.[name]'s oxygen saturation was between 93% to 96% on room air.We will offer Reginald Hahn to use oxygen with exercise as he wears oxygen as needed at home. Reginald Hahn says he will only be able to attend twice a week for a month due to the cost of the program.Maria Harlon Flor, RN,BSN 06/05/2017 12:32 PM

## 2017-06-11 ENCOUNTER — Encounter (HOSPITAL_COMMUNITY)
Admission: RE | Admit: 2017-06-11 | Discharge: 2017-06-11 | Disposition: A | Discharge status: Home / Self Care | Payer: Medicare Other | Source: Ambulatory Visit | Attending: Internal Medicine | Admitting: Internal Medicine

## 2017-06-11 DIAGNOSIS — I5022 Chronic systolic (congestive) heart failure: Secondary | ICD-10-CM | POA: Insufficient documentation

## 2017-06-11 NOTE — Progress Notes (Signed)
Daily Session Note  Patient Details  Name: Reginald Hahn MRN: [id number] Date of Birth: 02-16-1951 Referring Provider:     CARDIAC REHAB PHASE II ORIENTATION from 06/05/2017 in [facility]  Referring Provider  Reginald Bickers MD      Encounter Date: 06/11/2017  Check In: Session Check In - 06/11/17 1016      Check-In   Location  [facility]    Staff Present  Reginald Nutting Fair, MS, ACSM RCEP, Exercise Physiologist;Reginald Whitaker, RN, Reginald Pretty, MS, ACSM CEP, Exercise Physiologist;Reginald Rollene Rotunda, RN, BSN    Supervising physician immediately available to respond to emergencies  [facility] immediately available    Physician(s)  Reginald Hahn    Medication changes reported      No    Fall or balance concerns reported     No    Tobacco Cessation  No Change    Warm-up and Cool-down  Performed as group-led instruction    Resistance Training Performed  Yes    VAD Patient?  No      Pain Assessment   Currently in Pain?  No/denies    Multiple Pain Sites  No       Capillary Blood Glucose: No results found for this or any previous visit (from the past 24 hour(s)).    Social History   Tobacco Use  Smoking Status Former Smoker  . Packs/day: 2.00  . Years: 50.00  . Pack years: 100.00  . Types: Cigarettes  . Start date: 04/14/1963  . Last attempt to quit: 10/09/2015  . Years since quitting: 1.6  Smokeless Tobacco Never Used  Tobacco Comment   stopped for a couple of years in his 30s    Goals Met:  No report of cardiac concerns or symptoms  Goals Unmet:  Not Applicable  Comments: Reginald Hahn started cardiac rehab today.  Pt tolerated light exercise without difficulty. Reginald Hahn's resting vital signs were in normal limits. Reginald Hahn's exertional blood pressure was noted at 166/90 max, telemetry-Sinus Rhtyhm with a first degree heart block, asymptomatic.  Medication list reconciled. Pt denies barriers to medicaiton compliance.  PSYCHOSOCIAL  ASSESSMENT:  PHQ-0. Pt exhibits positive coping skills, hopeful outlook with supportive family. No psychosocial needs identified at this time, no psychosocial interventions necessary.    Pt enjoys watching TV and going to the mountains.   Pt oriented to exercise equipment and routine.    Understanding verbalized. Reginald Hahn's oxygen saturation was noted from 94-96% on room air. Will continue to monitor the patient throughout  the program.Reginald Venetia Maxon, RN,BSN 06/11/2017 4:55 PM    Dr. Fransico Hahn is Medical Director for Cardiac Rehab at [facility].

## 2017-06-12 ENCOUNTER — Encounter (HOSPITAL_COMMUNITY): Payer: Self-pay

## 2017-06-12 ENCOUNTER — Ambulatory Visit (HOSPITAL_COMMUNITY)
Admission: RE | Admit: 2017-06-12 | Discharge: 2017-06-12 | Disposition: A | Discharge status: Home / Self Care | Payer: Medicare Other | Source: Ambulatory Visit | Attending: Cardiology | Admitting: Cardiology

## 2017-06-12 VITALS — BP 156/88 | HR 76 | Wt 198.4 lb

## 2017-06-12 DIAGNOSIS — I110 Hypertensive heart disease with heart failure: Secondary | ICD-10-CM | POA: Insufficient documentation

## 2017-06-12 DIAGNOSIS — Z720 Tobacco use: Secondary | ICD-10-CM

## 2017-06-12 DIAGNOSIS — B349 Viral infection, unspecified: Secondary | ICD-10-CM | POA: Diagnosis not present

## 2017-06-12 DIAGNOSIS — I447 Left bundle-branch block, unspecified: Secondary | ICD-10-CM | POA: Diagnosis not present

## 2017-06-12 DIAGNOSIS — I10 Essential (primary) hypertension: Secondary | ICD-10-CM

## 2017-06-12 DIAGNOSIS — Z87891 Personal history of nicotine dependence: Secondary | ICD-10-CM | POA: Diagnosis not present

## 2017-06-12 DIAGNOSIS — I429 Cardiomyopathy, unspecified: Secondary | ICD-10-CM | POA: Insufficient documentation

## 2017-06-12 DIAGNOSIS — R570 Cardiogenic shock: Secondary | ICD-10-CM | POA: Diagnosis not present

## 2017-06-12 DIAGNOSIS — J449 Chronic obstructive pulmonary disease, unspecified: Secondary | ICD-10-CM | POA: Diagnosis not present

## 2017-06-12 DIAGNOSIS — Z7901 Long term (current) use of anticoagulants: Secondary | ICD-10-CM | POA: Insufficient documentation

## 2017-06-12 DIAGNOSIS — I2510 Atherosclerotic heart disease of native coronary artery without angina pectoris: Secondary | ICD-10-CM | POA: Insufficient documentation

## 2017-06-12 DIAGNOSIS — I5022 Chronic systolic (congestive) heart failure: Secondary | ICD-10-CM | POA: Insufficient documentation

## 2017-06-12 DIAGNOSIS — I272 Pulmonary hypertension, unspecified: Secondary | ICD-10-CM | POA: Insufficient documentation

## 2017-06-12 DIAGNOSIS — I480 Paroxysmal atrial fibrillation: Secondary | ICD-10-CM | POA: Diagnosis not present

## 2017-06-12 DIAGNOSIS — R000 Tachycardia, unspecified: Secondary | ICD-10-CM | POA: Insufficient documentation

## 2017-06-12 DIAGNOSIS — Z79899 Other long term (current) drug therapy: Secondary | ICD-10-CM | POA: Insufficient documentation

## 2017-06-12 LAB — BASIC METABOLIC PANEL
ANION GAP: 9 (ref 5–15)
BUN: 10 mg/dL (ref 6–20)
CO2: 25 mmol/L (ref 22–32)
Calcium: 9.3 mg/dL (ref 8.9–10.3)
Chloride: 102 mmol/L (ref 101–111)
Creatinine, Ser: 1.03 mg/dL (ref 0.61–1.24)
GFR calc Af Amer: >60 mL/min (ref >60)
GFR calc non Af Amer: >60 mL/min (ref >60)
Glucose, Bld: 120 mg/dL — ABNORMAL HIGH (ref 65–99)
POTASSIUM: 3.8 mmol/L (ref 3.5–5.1)
SODIUM: 136 mmol/L (ref 135–145)

## 2017-06-12 MED ORDER — SPIRONOLACTONE 25 MG PO TABS: 25.0000 mg | ORAL_TABLET | Freq: Every day | ORAL | 11 refills | Status: DC

## 2017-06-12 MED ORDER — LOSARTAN POTASSIUM 25 MG PO TABS: 25.0000 mg | ORAL_TABLET | Freq: Every day | ORAL | 11 refills | Status: DC

## 2017-06-12 NOTE — Patient Instructions (Signed)
Routine lab work today. Will notify you of abnormal results, otherwise no news is good news!  INCREASE Losartan to 25 mg (1 whole tablet) once daily.  INCREASE Spironolactone 25 mg (1 whole tablet) once daily.  Return in 1-2 weeks for lab work and echocardiogram.  ________________________________________________ [place] Code:  9002  Follow up 4 weeks with Otilio Saber PA-C.  ___________________________________________________ [place] Code: 1100  Follow up 2 months with Dr. Gala Romney.  ____________________________________________________ [place] Code: 1200  Take all medication as prescribed the day of your appointment. Bring all medications with you to your appointment.  Do the following things EVERYDAY: 1) Weigh yourself in the morning before breakfast. Write it down and keep it in a log. 2) Take your medicines as prescribed 3) Eat low salt foods-Limit salt (sodium) to 2000 mg per day.  4) Stay as active as you can everyday 5) Limit all fluids for the day to less than 2 liters

## 2017-06-12 NOTE — Progress Notes (Addendum)
Advanced Heart Failure Clinic Note   Patient ID: Reginald Hahn, male   DOB: 12-Mar-1951, 67 y.o.   MRN: [id number] Primary Cardiologist: Dr. Gala Romney   HPI: [name] "[name]" is a 67 y.o. male with h/o COPD admitted in 6/17 with acute systolic HF, AF with RVR and cardiogenic shock. LBBB  Patient was admitted 6/17 with acute systolic HF and new onset afib with RVR and EF found to be 15%.Felt to to have viral CM exacerbated by afib. Diuresed slowly so PICC placed and co-ox low. Started on IV amio and milrinone and subsequently diuresed well. Was pending cath and TEE/DC-CV but developed massive RP bleed and was transferred to ICU.   All anti-coagulants held. Received multiple transfusions. Subsequently recovered. During that time also had loss of peripheral vision in R eye. Head CT negative. Pre discharge had Myoview with EF 30-44%.  Inferior infarct vs gut attenuation. No ischemia.    Pulmonary Clinic with Dr. Jamison Neighbor and told he has severe COPD FEV1 1.83 (49%) FEF 25-75% 0.60L  DLCO 42%.   Pt admitted from 12/17 -> 04/10/17 with complicated admission consisting of AF with RVR, low output CHF, and URI. Pt transiently required milrinone.  After long discussion pt agreed to be placed on Eliquis, so was then candidate for DCCV with TEE. Pt failed DCCV 04/05/18 initially, but then converted to NSR overnight on IV amidoarone. Pt continued to go in-and-out of Afib throughout his admission. Decision made to send home on continued Amiodarone. Tolerated milrinone wean.   He presents today for regular follow up. Last visit switched to [facility], but patient couldn't afford. He did not communicate this to [name] before today. He is feeling good overall. He started cardiac rehab yesterday. He had no problem. He denies edema, lightheadedness or dizziness. No palpitations or CP. He denies bleeding on Eliquis.  Weight stable. Apart from change to [facility], He has been taking his other medications as directed. He  is limiting his salt and fluid intake.   Myoview 6/17  There was no ST segment deviation noted during stress.  The left ventricular ejection fraction is moderately decreased (30-44%).  Findings consistent with prior myocardial infarction.  This is an intermediate risk study.  nferior/inferolateral defect (moderate) that does not improve in the rest images consistent with scar and possible soft tissue attenuation. No significant ischemia.  RHC/LHC 08/29/2016 RA = 6 RV = 40/8 PA = 40/13 (24) PCW = 19 Ao = 104/82 (93) LV = 141/17 Fick cardiac output/index = 4.8/2.3 PVR = 1.0 WU SVR 1437  Ao sat = 90% PA sat = 62%, 63% Assessment: 1. Minimal non-obstructive CAD 2. Severe nonischemic CM with EF 25% by cath (EF likely underestimated on re-look likely 40%) 3. Relatively well compensated filling pressures with mild pulmonary venous HTN  Echo 8/17 EF 45% Echo 2/18 EF 45-50%  Review of systems complete and found to be negative unless listed in HPI.    Past Medical History:  Diagnosis Date  . Allergic rhinitis   . Allergy    seasonal allergies  . Arthritis   . Atrial fibrillation (HCC) 03/2017  . Black widow spider bite   . CHF (congestive heart failure) (HCC)   . Chronic systolic heart failure (HCC) 09/16/2015   Possibly tachycardia mediated // Echo 12/18: EF 20-25, trivial AI, mild RAE, PASP 35  . COPD (chronic obstructive pulmonary disease) (HCC)   . Hypertension   . Stroke (cerebrum) (HCC)   . Tobacco use    x40 years. 1  ppd   SH:  Social History   Socioeconomic History  . Marital status: Divorced    Spouse name: Not on file  . Number of children: Not on file  . Years of education: Not on file  . Highest education level: Not on file  Social Needs  . Financial resource strain: Somewhat hard  . Food insecurity - worry: Not on file  . Food insecurity - inability: Not on file  . Transportation needs - medical: Not on file  . Transportation needs - non-medical:  Not on file  Occupational History  . Not on file  Tobacco Use  . Smoking status: Former Smoker    Packs/day: 2.00    Years: 50.00    Pack years: 100.00    Types: Cigarettes    Start date: 04/14/1963    Last attempt to quit: 10/09/2015    Years since quitting: 1.6  . Smokeless tobacco: Never Used  . Tobacco comment: stopped for a couple of years in his 30s  Substance and Sexual Activity  . Alcohol use: No    Alcohol/week: 0.0 oz  . Drug use: No    Comment: Used to smoke marijuana  . Sexual activity: Not on file  Other Topics Concern  . 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 No pets currently. Remote exposure to a parrot for 2 years.     FH:  Family History  Problem Relation Age of Onset  . Heart disease Mother        died in her 35's.  . Diabetes Mother   . Prostate cancer Father        died in his 55's.  . Congenital heart disease Brother        died @ age 63.  . Lung disease Neg Hx     Past Medical History:  Diagnosis Date  . Allergic rhinitis   . Allergy    seasonal allergies  . Arthritis   . Atrial fibrillation (HCC) 03/2017  . Black widow spider bite   . CHF (congestive heart failure) (HCC)   . Chronic systolic heart failure (HCC) 09/16/2015   Possibly tachycardia mediated // Echo 12/18: EF 20-25, trivial AI, mild RAE, PASP 35  . COPD (chronic obstructive pulmonary disease) (HCC)   . Hypertension   . Stroke (cerebrum) (HCC)   . Tobacco use    x40 years. 1 ppd    Current Outpatient Medications  Medication Sig Dispense Refill  . acetaminophen (TYLENOL) 325 MG tablet Take 650 mg by mouth every 6 (six) hours as needed for mild pain or fever.    Marland Kitchen albuterol (PROVENTIL HFA;VENTOLIN HFA) 108  (90 Base) MCG/ACT inhaler Inhale 2 puffs into the lungs every 4 (four) hours as needed for wheezing or shortness of breath. 1 Inhaler 5  . amiodarone (PACERONE) 200 MG tablet Take 200 mg by mouth daily.    Marland Kitchen apixaban (ELIQUIS) 5 MG TABS tablet Take 1 tablet (5 mg total) by mouth 2 (two) times daily. 60 tablet 11  . budesonide-formoterol (SYMBICORT) 160-4.5 MCG/ACT inhaler Inhale 2 puffs 2 (two) times daily into the lungs. 1 Inhaler 6  . cetirizine (ZYRTEC) 10 MG tablet Take 10 mg  by mouth daily.    . digoxin (LANOXIN) 0.125 MG tablet Take 1 tablet (0.125 mg total) by mouth daily. 30 tablet 11  . furosemide (LASIX) 40 MG tablet Take 1 tablet (40 mg total) by mouth daily as needed for fluid or edema. 30 tablet 11  . losartan (COZAAR) 25 MG tablet Take 25 mg by mouth daily.    . mometasone-formoterol (DULERA) 100-5 MCG/ACT AERO Inhale 2 puffs into the lungs 2 (two) times daily.    . Multiple Vitamin (MULTIVITAMIN WITH MINERALS) TABS tablet Take 1 tablet by mouth daily.    Marland Kitchen omeprazole (PRILOSEC) 20 MG capsule Take 20 mg by mouth every morning.    . OXYGEN Inhale 1 L into the lungs as needed (Shortness of breath).    . potassium chloride SA (K-DUR,KLOR-CON) 20 MEQ tablet Take 1 tablet (20 mEq total) by mouth daily. 30 tablet 11  . spironolactone (ALDACTONE) 25 MG tablet Take 0.5 tablets (12.5 mg total) by mouth daily. 15 tablet 11   No current facility-administered medications for this encounter.    Vitals:   06/12/17 0908  BP: (!) 156/88  Pulse: 76  SpO2: 93%  Weight: 198 lb 6.4 oz (90 kg)   Wt Readings from Last 3 Encounters:  06/12/17 198 lb 6.4 oz (90 kg)  06/05/17 197 lb 1.5 oz (89.4 kg)  05/25/17 196 lb 9.6 oz (89.2 kg)    PHYSICAL EXAM: General: Well appearing. No resp difficulty. HEENT: Normal Neck: Supple. JVP 6-7. Carotids 2+ bilat; no bruits. No thyromegaly or nodule noted. Cor: PMI nondisplaced. RRR, No M/G/R noted Lungs: CTAB, normal effort. Abdomen: Soft, non-tender,  non-distended, no HSM. No bruits or masses. +BS  Extremities: No cyanosis, clubbing, or rash. R and LLE no edema.  Neuro: Alert & orientedx3, cranial nerves grossly intact. moves all 4 extremities w/o difficulty. Affect pleasant   EKG: NSR 71 bpm, LBBB QRS 142 ms, personally reviewed  ASSESSMENT & PLAN:  1. Chronic Systolic HF due to tachy-induced CM in the setting of recurrent AF in setting of viral illness - Echo 03/28/2017 LVEF ~20%, worsened from Echo 2/18 EF 45-50% in setting of Afib RVR.  - NYHA II. Started cardiac rehab yesterday.  - Volume status stable on exam.  - Continue lasix 40 mg daily for now.  - Increase spiro to 25 mg daily. BMET today and 10 days.  - Increase losartan to 25 mg daily.  - Continue digoxin 0.125 mg daily - No BB with recent low output.  - Reinforced fluid restriction to < 2 L daily, sodium restriction to less than 2000 mg daily, and the importance of daily weights.   2. PAF  - EKG today shows NSR.  - Continue amiodarone 200 mg BID for now.  - He seems to be going in and out of Afib by history and observation in hospital.  - Now on Eliquis without bleeding. No change. - Will refer for AF ablation once EF improves.  3. h/o RP bleed - Resolved.  - No s/s of bleeding on Eliquis.  4. COPD - Sees Dr. Sherene Sires (Previosly Dr. Jamison Neighbor) - OK to resume Spiriva. Limit ventolin to as needed.  - Has stopped smoking.  5. Transient loss of R peripheral vision - Concerning for embolic CVA in setting of AF - Pt declines Neuro referral, MRI, and/or carotid dopplers. No change.  - Now on Eliquis as above.  6. HTN - Elevated today. Meds as above.  7. LBBB - QRS 142 on EKG  today.   Meds and labs as above. Repeat Echo.  RTC 4 weeks. MD visit 2 months.   Graciella Freer, PA-C  9:16 AM   Greater than 50% of the 25 minute visit was spent in counseling/coordination of care regarding disease state education, salt/fluid restriction, sliding scale diuretics, and  medication compliance.

## 2017-06-13 ENCOUNTER — Telehealth (HOSPITAL_COMMUNITY): Payer: Self-pay | Admitting: General Practice

## 2017-06-13 ENCOUNTER — Encounter (HOSPITAL_COMMUNITY): Payer: Medicare Other

## 2017-06-15 ENCOUNTER — Encounter (HOSPITAL_COMMUNITY): Payer: Medicare Other

## 2017-06-18 ENCOUNTER — Encounter (HOSPITAL_COMMUNITY)
Admission: RE | Admit: 2017-06-18 | Discharge: 2017-06-18 | Disposition: A | Discharge status: Home / Self Care | Payer: Medicare Other | Source: Ambulatory Visit | Attending: Internal Medicine | Admitting: Internal Medicine

## 2017-06-18 VITALS — BP 102/74 | HR 64 | Wt 198.4 lb

## 2017-06-18 DIAGNOSIS — I5022 Chronic systolic (congestive) heart failure: Secondary | ICD-10-CM

## 2017-06-18 NOTE — Progress Notes (Signed)
Reginald Hahn complained of having increased shortness of breath while exercising today at cardiac rehab. Oxygen saturation 96% on room air. Lung fields with bilateral diminished breath sounds. Reginald Hahn's weight is 90.0 kg. [name] also complained of chronic knee pain. Blood pressure 118/60. Heart rate 74. Jasmine at the heart failure clinic called about [name]'s symptoms and complaints. Reginald Hahn left early today. Will try the use of oxygen at Reginald Hahn's next exercise session to see if it will help with his complaints of shortness of breath. Reginald Hahn symptoms of shortness of breath resolved after sitting down to rest. No complaints upon exit from cardiac rehab.Will continue to monitor the patient throughout  the program.Maria Harlon Flor, RN,BSN 06/18/2017 2:08 PM

## 2017-06-20 ENCOUNTER — Encounter (HOSPITAL_COMMUNITY): Payer: Medicare Other

## 2017-06-22 ENCOUNTER — Encounter (HOSPITAL_COMMUNITY): Payer: Medicare Other

## 2017-06-22 NOTE — Progress Notes (Signed)
QUALITY OF LIFE SCORE REVIEW  Pt completed Quality of Life survey as a participant in Cardiac Rehab. Scores 21.0 or below are considered low. Pt score very low in several areas Overall 21.43, Health and Function 20.40, socioeconomic 19.5, physiological and spiritual 24.86, family 22.8. Patient quality of life slightly altered by physical constraints which limits ability to perform as prior to recent cardiac illness.Reginald Hahn says he is dissatisfied with his health due to his heart and still feels short of breath with activity .  Offered emotional support and reassurance.  Will continue to monitor and intervene as necessary.  Will forward quality of life to Dr Bensimhon's office for review.Gladstone Lighter, RN,BSN 06/22/2017 9:22 AM

## 2017-06-25 ENCOUNTER — Encounter (HOSPITAL_COMMUNITY): Payer: Medicare Other

## 2017-06-26 ENCOUNTER — Ambulatory Visit (HOSPITAL_COMMUNITY)
Admission: RE | Admit: 2017-06-26 | Discharge: 2017-06-26 | Disposition: A | Discharge status: Home / Self Care | Payer: Medicare Other | Source: Ambulatory Visit | Attending: Cardiology | Admitting: Cardiology

## 2017-06-26 DIAGNOSIS — I5022 Chronic systolic (congestive) heart failure: Secondary | ICD-10-CM | POA: Insufficient documentation

## 2017-06-26 DIAGNOSIS — I4891 Unspecified atrial fibrillation: Secondary | ICD-10-CM | POA: Insufficient documentation

## 2017-06-26 DIAGNOSIS — Z720 Tobacco use: Secondary | ICD-10-CM | POA: Diagnosis not present

## 2017-06-26 DIAGNOSIS — J449 Chronic obstructive pulmonary disease, unspecified: Secondary | ICD-10-CM | POA: Diagnosis not present

## 2017-06-26 DIAGNOSIS — Z8673 Personal history of transient ischemic attack (TIA), and cerebral infarction without residual deficits: Secondary | ICD-10-CM | POA: Diagnosis not present

## 2017-06-26 LAB — BASIC METABOLIC PANEL
ANION GAP: 11 (ref 5–15)
BUN: 11 mg/dL (ref 6–20)
CALCIUM: 9.3 mg/dL (ref 8.9–10.3)
CHLORIDE: 101 mmol/L (ref 101–111)
CO2: 25 mmol/L (ref 22–32)
CREATININE: 1.07 mg/dL (ref 0.61–1.24)
GFR calc non Af Amer: >60 mL/min (ref >60)
Glucose, Bld: 110 mg/dL — ABNORMAL HIGH (ref 65–99)
Potassium: 3.9 mmol/L (ref 3.5–5.1)
Sodium: 137 mmol/L (ref 135–145)

## 2017-06-26 NOTE — Progress Notes (Signed)
  Echocardiogram 2D Echocardiogram has been performed.  Reginald Hahn 06/26/2017, 9:30 AM

## 2017-06-27 ENCOUNTER — Encounter (HOSPITAL_COMMUNITY): Payer: Medicare Other

## 2017-06-28 ENCOUNTER — Telehealth (HOSPITAL_COMMUNITY): Payer: Self-pay | Admitting: *Deleted

## 2017-06-28 ENCOUNTER — Encounter (HOSPITAL_COMMUNITY): Payer: Self-pay | Admitting: *Deleted

## 2017-06-28 DIAGNOSIS — I5022 Chronic systolic (congestive) heart failure: Secondary | ICD-10-CM

## 2017-06-28 NOTE — Progress Notes (Signed)
Discharge Progress Report  Patient Details  Name: Reginald Hahn MRN: [id number] Date of Birth: 06-Apr-1951 Referring Provider:     CARDIAC REHAB PHASE II ORIENTATION from 06/05/2017 in [facility] CARDIAC [facility]  Referring Provider  Arvilla Meres MD       Number of Visits: 3  Reason for Discharge:  Early Exit:  Personal  Smoking History:  Social History   Tobacco Use  Smoking Status Former Smoker  . Packs/day: 2.00  . Years: 50.00  . Pack years: 100.00  . Types: Cigarettes  . Start date: 04/14/1963  . Last attempt to quit: 10/09/2015  . Years since quitting: 1.7  Smokeless Tobacco Never Used  Tobacco Comment   stopped for a couple of years in his 30s    Diagnosis:  Chronic systolic congestive heart failure (HCC)  ADL UCSD:   Initial Exercise Prescription: Initial Exercise Prescription - 06/05/17 1000      Date of Initial Exercise RX and Referring Provider   Date  06/05/17    Referring Provider  Arvilla Meres MD      Bike   Level  0.5    Minutes  10    METs  2.05      NuStep   Level  3    SPM  70    Minutes  10    METs  2      Track   Laps  10    Minutes  10    METs  2.4      Prescription Details   Frequency (times per week)  3    Duration  Progress to 30 minutes of continuous aerobic without signs/symptoms of physical distress      Intensity   THRR 40-80% of Max Heartrate  62-123    Ratings of Perceived Exertion  11-15    Perceived Dyspnea  0-4      Progression   Progression  Continue to progress workloads to maintain intensity without signs/symptoms of physical distress.      Resistance Training   Training Prescription  Yes    Weight  3lbs    Reps  10-15       Discharge Exercise Prescription (Final Exercise Prescription Changes): Exercise Prescription Changes - 06/18/17 0947      Response to Exercise   Blood Pressure (Admit)  102/74    Blood Pressure (Exercise)  128/80    Blood Pressure (Exit)  118/60    Heart Rate (Admit)  64 bpm    Heart Rate (Exercise)  91 bpm    Heart Rate (Exit)  70 bpm    Oxygen Saturation (Exercise)  96 % room air    Oxygen Saturation (Exit)  94 % room air    Rating of Perceived Exertion (Exercise)  12    Symptoms  SOB    Comments  Exercise stopped due to SOB.    Duration  Progress to 30 minutes of  aerobic without signs/symptoms of physical distress    Intensity  THRR unchanged      Progression   Progression  Continue to progress workloads to maintain intensity without signs/symptoms of physical distress.    Average METs  2.4      Resistance Training   Training Prescription  No Exercised stopped due to SOB.      Interval Training   Interval Training  No      Bike   Level  0.5    Minutes  10    METs  2.05  NuStep   Level  --    SPM  --    Minutes  --    METs  --      Track   Laps  10    Minutes  10    METs  2.74       Functional Capacity: 6 Minute Walk    Row Name 06/05/17 1029         6 Minute Walk   Phase  Initial     Distance  1236 feet     Walk Time  6 minutes     # of Rest Breaks  0     MPH  2.3     METS  3.2     RPE  12     Perceived Dyspnea   1     VO2 Peak  11.4     Symptoms  Yes (comment)     Comments  SOB- mild      Resting HR  74 bpm     Resting BP  138/82     Resting Oxygen Saturation   96 %     Exercise Oxygen Saturation  during 6 min walk  96 %     Max Ex. HR  100 bpm     Max Ex. BP  154/84     2 Minute Post BP  124/70        Psychological, QOL, Others - Outcomes: PHQ 2/9: Depression screen [facility] 2/9 06/11/2017 10/11/2015 09/30/2015  Decreased Interest 0 0 0  Down, Depressed, Hopeless 0 0 0  PHQ - 2 Score 0 0 0    Quality of Life: Quality of Life - 06/05/17 0934      Quality of Life Scores   Health/Function Pre  20.4 %    Socioeconomic Pre  19.5 %    Psych/Spiritual Pre  24.86 %    Family Pre  22.8 %    GLOBAL Pre  21.43 %       Personal Goals: Goals established at orientation with interventions  provided to work toward goal. Personal Goals and Risk Factors at Admission - 06/05/17 1222      Core Components/Risk Factors/Patient Goals on Admission    Weight Management  Yes;Weight Maintenance;Weight Loss    Intervention  Weight Management: Develop a combined nutrition and exercise program designed to reach desired caloric intake, while maintaining appropriate intake of nutrient and fiber, sodium and fats, and appropriate energy expenditure required for the weight goal.;Weight Management/Obesity: Establish reasonable short term and long term weight goals.;Weight Management: Provide education and appropriate resources to help participant work on and attain dietary goals.    Admit Weight  197 lb 1.5 oz (89.4 kg)    Goal Weight: Short Term  190 lb (86.2 kg)    Goal Weight: Long Term  185 lb (83.9 kg)    Expected Outcomes  Short Term: Continue to assess and modify interventions until short term weight is achieved;Long Term: Adherence to nutrition and physical activity/exercise program aimed toward attainment of established weight goal;Weight Maintenance: Understanding of the daily nutrition guidelines, which includes 25-35% calories from fat, 7% or less cal from saturated fats, less than 200mg  cholesterol, less than 1.5gm of sodium, & 5 or more servings of fruits and vegetables daily;Weight Loss: Understanding of general recommendations for a balanced deficit meal plan, which promotes 1-2 lb weight loss per week and includes a negative energy balance of [phone] kcal/d;Understanding recommendations for meals to include 15-35% energy as protein, 25-35%  energy from fat, 35-60% energy from carbohydrates, less than 200mg  of dietary cholesterol, 20-35 gm of total fiber daily;Understanding of distribution of calorie intake throughout the day with the consumption of 4-5 meals/snacks    Improve shortness of breath with ADL's  Yes    Intervention  Provide education, individualized exercise plan and daily activity  instruction to help decrease symptoms of SOB with activities of daily living.    Expected Outcomes  Short Term: Improve cardiorespiratory fitness to achieve a reduction of symptoms when performing ADLs;Long Term: Be able to perform more ADLs without symptoms or delay the onset of symptoms    Heart Failure  Yes    Intervention  Provide a combined exercise and nutrition program that is supplemented with education, support and counseling about heart failure. Directed toward relieving symptoms such as shortness of breath, decreased exercise tolerance, and extremity edema.    Expected Outcomes  Improve functional capacity of life;Short term: Attendance in program 2-3 days a week with increased exercise capacity. Reported lower sodium intake. Reported increased fruit and vegetable intake. Reports medication compliance.;Short term: Daily weights obtained and reported for increase. Utilizing diuretic protocols set by physician.;Long term: Adoption of self-care skills and reduction of barriers for early signs and symptoms recognition and intervention leading to self-care maintenance.    Hypertension  Yes    Intervention  Provide education on lifestyle modifcations including regular physical activity/exercise, weight management, moderate sodium restriction and increased consumption of fresh fruit, vegetables, and low fat dairy, alcohol moderation, and smoking cessation.;Monitor prescription use compliance.    Expected Outcomes  Short Term: Continued assessment and intervention until BP is < 140/28mm HG in hypertensive participants. < 130/20mm HG in hypertensive participants with diabetes, heart failure or chronic kidney disease.;Long Term: Maintenance of blood pressure at goal levels.        Personal Goals Discharge:   Exercise Goals and Review: Exercise Goals    Row Name 06/05/17 0929             Exercise Goals   Increase Physical Activity  Yes       Intervention  Provide advice, education, support  and counseling about physical activity/exercise needs.;Develop an individualized exercise prescription for aerobic and resistive training based on initial evaluation findings, risk stratification, comorbidities and participant's personal goals.       Expected Outcomes  Short Term: Attend rehab on a regular basis to increase amount of physical activity.;Long Term: Exercising regularly at least 3-5 days a week.;Long Term: Add in home exercise to make exercise part of routine and to increase amount of physical activity.       Increase Strength and Stamina  Yes improve walking tolerance and confidence with exercise       Intervention  Provide advice, education, support and counseling about physical activity/exercise needs.;Develop an individualized exercise prescription for aerobic and resistive training based on initial evaluation findings, risk stratification, comorbidities and participant's personal goals.       Expected Outcomes  Short Term: Increase workloads from initial exercise prescription for resistance, speed, and METs.;Short Term: Perform resistance training exercises routinely during rehab and add in resistance training at home;Long Term: Improve cardiorespiratory fitness, muscular endurance and strength as measured by increased METs and functional capacity ( )       Able to understand and use rate of perceived exertion (RPE) scale  Yes       Intervention  Provide education and explanation on how to use RPE scale       Expected  Outcomes  Short Term: Able to use RPE daily in rehab to express subjective intensity level;Long Term:  Able to use RPE to guide intensity level when exercising independently       Knowledge and understanding of Target Heart Rate Range (THRR)  Yes       Intervention  Provide education and explanation of THRR including how the numbers were predicted and where they are located for reference       Expected Outcomes  Short Term: Able to state/look up THRR;Long Term: Able to  use THRR to govern intensity when exercising independently;Short Term: Able to use daily as guideline for intensity in rehab       Able to check pulse independently  Yes       Intervention  Provide education and demonstration on how to check pulse in carotid and radial arteries.;Review the importance of being able to check your own pulse for safety during independent exercise       Expected Outcomes  Short Term: Able to explain why pulse checking is important during independent exercise;Long Term: Able to check pulse independently and accurately       Understanding of Exercise Prescription  Yes       Intervention  Provide education, explanation, and written materials on patient's individual exercise prescription       Expected Outcomes  Long Term: Able to explain home exercise prescription to exercise independently;Short Term: Able to explain program exercise prescription          Nutrition & Weight - Outcomes: Pre Biometrics - 06/05/17 1034      Pre Biometrics   Height  5' 11.5" (1.816 m)    Weight  197 lb 1.5 oz (89.4 kg)    Waist Circumference  42 inches    Hip Circumference  42 inches    Waist to Hip Ratio  1 %    BMI (Calculated)  27.11    Triceps Skinfold  17 mm    % Body Fat  28.4 %    Grip Strength  43 kg    Flexibility  13 in    Single Leg Stand  10 seconds      Post Biometrics - 06/18/17 0947       Post  Biometrics   Weight  198 lb 6.6 oz (90 kg)    BMI (Calculated)  27.29       Nutrition: Nutrition Therapy & Goals - 06/05/17 1222      Nutrition Therapy   Diet  Heart Healthy      Personal Nutrition Goals   Nutrition Goal  Pt to identify and limit food sources of saturated fat, trans fat, and sodium    Personal Goal #2  Pt to identify food quantities necessary to achieve weight loss of 6-22 lb (2.7-10.9 kg) at graduation from cardiac rehab. Goal wt of 175-180 lb desired.       Intervention Plan   Intervention  Prescribe, educate and counsel regarding  individualized specific dietary modifications aiming towards targeted core components such as weight, hypertension, lipid management, diabetes, heart failure and other comorbidities.    Expected Outcomes  Short Term Goal: Understand basic principles of dietary content, such as calories, fat, sodium, cholesterol and nutrients.;Long Term Goal: Adherence to prescribed nutrition plan.       Nutrition Discharge: Nutrition Assessments - 06/05/17 1222      MEDFICTS Scores   Pre Score  62       Education Questionnaire Score: Knowledge Questionnaire Score -  06/05/17 0933      Knowledge Questionnaire Score   Pre Score  16/24       [name] attended 3 exercise sessions between 06/05/17-06/18/17.  [name] decided not to return to participate in cardiac rehab.Gladstone Lighter, RN,BSN 07/18/2017 9:01 AM

## 2017-06-29 ENCOUNTER — Encounter (HOSPITAL_COMMUNITY): Payer: Medicare Other

## 2017-07-02 ENCOUNTER — Encounter (HOSPITAL_COMMUNITY): Payer: Medicare Other

## 2017-07-04 ENCOUNTER — Encounter (HOSPITAL_COMMUNITY): Payer: Medicare Other

## 2017-07-06 ENCOUNTER — Encounter (HOSPITAL_COMMUNITY): Payer: Medicare Other

## 2017-07-09 ENCOUNTER — Encounter (HOSPITAL_COMMUNITY): Payer: Medicare Other

## 2017-07-09 ENCOUNTER — Telehealth (HOSPITAL_COMMUNITY): Payer: Self-pay

## 2017-07-09 NOTE — Telephone Encounter (Signed)
Result Notes for ECHOCARDIOGRAM COMPLETE   Notes recorded by Chyrl Civatte, RN on 07/09/2017 at 4:49 PM EDT Patient aware and appreciative, confirms upcoming appt tomorrow. ------  Notes recorded by Graciella Freer, PA-C on 06/26/2017 at 3:36 PM EDT Please let him know that his EF is improving.     Casimiro Needle [address] [place], [place] 06/26/2017 3:36 PM

## 2017-07-10 ENCOUNTER — Encounter (HOSPITAL_COMMUNITY): Payer: Self-pay

## 2017-07-10 ENCOUNTER — Ambulatory Visit (HOSPITAL_COMMUNITY)
Admission: RE | Admit: 2017-07-10 | Discharge: 2017-07-10 | Disposition: A | Discharge status: Home / Self Care | Payer: Medicare Other | Source: Ambulatory Visit | Attending: Cardiology | Admitting: Cardiology

## 2017-07-10 VITALS — BP 166/102 | HR 73 | Wt 199.6 lb

## 2017-07-10 DIAGNOSIS — Z833 Family history of diabetes mellitus: Secondary | ICD-10-CM | POA: Insufficient documentation

## 2017-07-10 DIAGNOSIS — I5022 Chronic systolic (congestive) heart failure: Secondary | ICD-10-CM | POA: Insufficient documentation

## 2017-07-10 DIAGNOSIS — Z7901 Long term (current) use of anticoagulants: Secondary | ICD-10-CM | POA: Diagnosis not present

## 2017-07-10 DIAGNOSIS — M1990 Unspecified osteoarthritis, unspecified site: Secondary | ICD-10-CM | POA: Insufficient documentation

## 2017-07-10 DIAGNOSIS — Z79899 Other long term (current) drug therapy: Secondary | ICD-10-CM | POA: Diagnosis not present

## 2017-07-10 DIAGNOSIS — J449 Chronic obstructive pulmonary disease, unspecified: Secondary | ICD-10-CM | POA: Insufficient documentation

## 2017-07-10 DIAGNOSIS — R0602 Shortness of breath: Secondary | ICD-10-CM

## 2017-07-10 DIAGNOSIS — I447 Left bundle-branch block, unspecified: Secondary | ICD-10-CM | POA: Insufficient documentation

## 2017-07-10 DIAGNOSIS — Z7951 Long term (current) use of inhaled steroids: Secondary | ICD-10-CM | POA: Insufficient documentation

## 2017-07-10 DIAGNOSIS — I428 Other cardiomyopathies: Secondary | ICD-10-CM | POA: Diagnosis not present

## 2017-07-10 DIAGNOSIS — Z720 Tobacco use: Secondary | ICD-10-CM | POA: Diagnosis not present

## 2017-07-10 DIAGNOSIS — Z8249 Family history of ischemic heart disease and other diseases of the circulatory system: Secondary | ICD-10-CM | POA: Insufficient documentation

## 2017-07-10 DIAGNOSIS — I2510 Atherosclerotic heart disease of native coronary artery without angina pectoris: Secondary | ICD-10-CM | POA: Diagnosis not present

## 2017-07-10 DIAGNOSIS — I10 Essential (primary) hypertension: Secondary | ICD-10-CM

## 2017-07-10 DIAGNOSIS — I110 Hypertensive heart disease with heart failure: Secondary | ICD-10-CM | POA: Insufficient documentation

## 2017-07-10 DIAGNOSIS — Z8042 Family history of malignant neoplasm of prostate: Secondary | ICD-10-CM | POA: Diagnosis not present

## 2017-07-10 DIAGNOSIS — I480 Paroxysmal atrial fibrillation: Secondary | ICD-10-CM | POA: Diagnosis not present

## 2017-07-10 DIAGNOSIS — Z87891 Personal history of nicotine dependence: Secondary | ICD-10-CM | POA: Insufficient documentation

## 2017-07-10 DIAGNOSIS — Z8279 Family history of other congenital malformations, deformations and chromosomal abnormalities: Secondary | ICD-10-CM | POA: Insufficient documentation

## 2017-07-10 DIAGNOSIS — N529 Male erectile dysfunction, unspecified: Secondary | ICD-10-CM | POA: Diagnosis not present

## 2017-07-10 DIAGNOSIS — Z8673 Personal history of transient ischemic attack (TIA), and cerebral infarction without residual deficits: Secondary | ICD-10-CM | POA: Diagnosis not present

## 2017-07-10 LAB — BASIC METABOLIC PANEL
Anion gap: 10 (ref 5–15)
BUN: 15 mg/dL (ref 6–20)
CO2: 27 mmol/L (ref 22–32)
CREATININE: 1.13 mg/dL (ref 0.61–1.24)
Calcium: 9.4 mg/dL (ref 8.9–10.3)
Chloride: 99 mmol/L — ABNORMAL LOW (ref 101–111)
GFR calc Af Amer: >60 mL/min (ref >60)
GFR calc non Af Amer: >60 mL/min (ref >60)
Glucose, Bld: 116 mg/dL — ABNORMAL HIGH (ref 65–99)
Potassium: 3.9 mmol/L (ref 3.5–5.1)
SODIUM: 136 mmol/L (ref 135–145)

## 2017-07-10 MED ORDER — LOSARTAN POTASSIUM 25 MG PO TABS: 25.0000 mg | ORAL_TABLET | Freq: Two times a day (BID) | ORAL | 11 refills | Status: DC

## 2017-07-10 NOTE — Patient Instructions (Signed)
INCREASE Losartan to 25 mg tablet TWICE daily.  Routine lab work today. Will notify you of abnormal results, otherwise no news is good news!  Follow up as scheduled next month with Dr. Gala Romney.  Take all medication as prescribed the day of your appointment. Bring all medications with you to your appointment.  Do the following things EVERYDAY: 1) Weigh yourself in the morning before breakfast. Write it down and keep it in a log. 2) Take your medicines as prescribed 3) Eat low salt foods-Limit salt (sodium) to 2000 mg per day.  4) Stay as active as you can everyday 5) Limit all fluids for the day to less than 2 liters

## 2017-07-10 NOTE — Progress Notes (Signed)
Advanced Heart Failure Clinic Note   Patient ID: Reginald Hahn, male   DOB: 1950/09/08, 67 y.o.   MRN: [id number] Primary Cardiologist: Dr. Gala Romney   HPI: Reginald Wondra "Reginald Hahn" is a 67 y.o. male with h/o COPD admitted in 6/17 with acute systolic HF, AF with RVR and cardiogenic shock. LBBB  Patient was admitted 6/17 with acute systolic HF and new onset afib with RVR and EF found to be 15%.Felt to to have viral CM exacerbated by afib. Diuresed slowly so PICC placed and co-ox low. Started on IV amio and milrinone and subsequently diuresed well. Was pending cath and TEE/DC-CV but developed massive RP bleed and was transferred to ICU.   All anti-coagulants held. Received multiple transfusions. Subsequently recovered. During that time also had loss of peripheral vision in R eye. Head CT negative. Pre discharge had Myoview with EF 30-44%.  Inferior infarct vs gut attenuation. No ischemia.    Pulmonary Clinic with Dr. Jamison Neighbor and told he has severe COPD FEV1 1.83 (49%) FEF 25-75% 0.60L  DLCO 42%.   Pt admitted from 12/17 -> 04/10/17 with complicated admission consisting of AF with RVR, low output CHF, and URI. Pt transiently required milrinone.  After long discussion pt agreed to be placed on Eliquis, so was then candidate for DCCV with TEE. Pt failed DCCV 04/05/18 initially, but then converted to NSR overnight on IV amidoarone. Pt continued to go in-and-out of Afib throughout his admission. Decision made to send home on continued Amiodarone. Tolerated milrinone wean.   He presents today for follow up. Last visit increased spiro.  BP elevated on arrival but component of [place] syndrome. BP running 130s at home regularly. Denies lightheadedness or dizziness. He tried cardiac rehab but hurt his knee and back doing lunges and squats so has stopped. 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 Denies palpitations or CP. No bleeding on Eliquis.  Weight stable.  Taking his medication as directed. Would like to use 25 mg sildenafil prn.   Myoview 6/17  There was no ST segment deviation noted during stress.  The left ventricular ejection fraction is moderately decreased (30-44%).  Findings consistent with prior myocardial infarction.  This is an intermediate risk study.  nferior/inferolateral defect (moderate) that does not improve in the rest images consistent with scar and possible soft tissue attenuation. No significant ischemia.  RHC/LHC 08/29/2016 RA = 6 RV = 40/8 PA = 40/13 (24) PCW = 19 Ao = 104/82 (93) LV = 141/17 Fick cardiac output/index = 4.8/2.3 PVR = 1.0 WU SVR 1437  Ao sat = 90% PA sat = 62%, 63% Assessment: 1. Minimal non-obstructive CAD 2. Severe nonischemic CM with EF 25% by cath (EF likely underestimated on re-look likely 40%) 3. Relatively well compensated filling pressures with mild pulmonary venous HTN  Echo 8/17 EF 45% Echo 2/18 EF 45-50%  Review of systems complete and found to be negative unless listed in HPI.    Past Medical History:  Diagnosis Date  . Allergic rhinitis   . Allergy    seasonal allergies  . Arthritis   . Atrial fibrillation (HCC) 03/2017  . Black widow spider bite   . CHF (congestive heart failure) (HCC)   . Chronic systolic heart failure (HCC) 09/16/2015   Possibly tachycardia mediated // Echo 12/18: EF 20-25, trivial AI, mild RAE, PASP 35  . COPD (chronic obstructive pulmonary disease) (HCC)   . Hypertension   . Stroke (cerebrum) (HCC)   .  Tobacco use    x40 years. 1 ppd   SH:  Social History   Socioeconomic History  . Marital status: Divorced    Spouse name: Not on file  . Number of children: Not on file  . Years of education: Not on file  . Highest education level: Not on file  Occupational History  . Not on file  Social Needs  . Financial resource strain: Somewhat hard  . Food insecurity:    Worry: Not on file    Inability: Not on file  . Transportation needs:      Medical: Not on file    Non-medical: Not on file  Tobacco Use  . Smoking status: Former Smoker    Packs/day: 2.00    Years: 50.00    Pack years: 100.00    Types: Cigarettes    Start date: 04/14/1963    Last attempt to quit: 10/09/2015    Years since quitting: 1.7  . Smokeless tobacco: Never Used  . Tobacco comment: stopped for a couple of years in his 30s  Substance and Sexual Activity  . Alcohol use: No    Alcohol/week: 0.0 oz  . Drug use: No    Comment: Used to smoke marijuana  . Sexual activity: Not on file  Lifestyle  . Physical activity:    Days per week: 0 days    Minutes per session: 0 min  . Stress: Not on file  Relationships  . Social connections:    Talks on phone: Not on file    Gets together: Not on file    Attends religious service: Not on file    Active member of club or organization: Not on file    Attends meetings of clubs or organizations: Not on file    Relationship status: Not on file  . Intimate partner violence:    Fear of current or ex partner: Not on file    Emotionally abused: Not on file    Physically abused: Not on file    Forced sexual activity: Not on file  Other Topics Concern  . 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 No pets currently. Remote exposure to a parrot for 2 years.     FH:  Family History  Problem Relation Age of Onset  . Heart disease Mother        died in her 80's.  . Diabetes Mother   . Prostate cancer Father        died in his 53's.  . Congenital heart disease Brother        died @ age 60.  . Lung disease Neg Hx     Past Medical History:  Diagnosis Date  . Allergic rhinitis   . Allergy    seasonal allergies  . Arthritis   . Atrial  fibrillation (HCC) 03/2017  . Black widow spider bite   . CHF (congestive heart failure) (HCC)   . Chronic systolic heart failure (HCC) 09/16/2015   Possibly tachycardia mediated // Echo 12/18: EF 20-25, trivial AI, mild RAE, PASP 35  . COPD (chronic obstructive pulmonary disease) (HCC)   . Hypertension   . Stroke (cerebrum) (HCC)   . Tobacco use  x40 years. 1 ppd    Current Outpatient Medications  Medication Sig Dispense Refill  . acetaminophen (TYLENOL) 325 MG tablet Take 650 mg by mouth every 6 (six) hours as needed for mild pain or fever.    Marland Kitchen albuterol (PROVENTIL HFA;VENTOLIN HFA) 108 (90 Base) MCG/ACT inhaler Inhale 2 puffs into the lungs every 4 (four) hours as needed for wheezing or shortness of breath. 1 Inhaler 5  . amiodarone (PACERONE) 200 MG tablet Take 200 mg by mouth daily.    Marland Kitchen apixaban (ELIQUIS) 5 MG TABS tablet Take 1 tablet (5 mg total) by mouth 2 (two) times daily. 60 tablet 11  . budesonide-formoterol (SYMBICORT) 160-4.5 MCG/ACT inhaler Inhale 2 puffs 2 (two) times daily into the lungs. 1 Inhaler 6  . cetirizine (ZYRTEC) 10 MG tablet Take 10 mg by mouth daily.    . digoxin (LANOXIN) 0.125 MG tablet Take 1 tablet (0.125 mg total) by mouth daily. 30 tablet 11  . furosemide (LASIX) 40 MG tablet Take 1 tablet (40 mg total) by mouth daily as needed for fluid or edema. 30 tablet 11  . losartan (COZAAR) 25 MG tablet Take 1 tablet (25 mg total) by mouth daily. 30 tablet 11  . mometasone-formoterol (DULERA) 100-5 MCG/ACT AERO Inhale 2 puffs into the lungs 2 (two) times daily.    . Multiple Vitamin (MULTIVITAMIN WITH MINERALS) TABS tablet Take 1 tablet by mouth daily.    Marland Kitchen omeprazole (PRILOSEC) 20 MG capsule Take 20 mg by mouth every morning.    . OXYGEN Inhale 1 L into the lungs as needed (Shortness of breath).    . potassium chloride SA (K-DUR,KLOR-CON) 20 MEQ tablet Take 1 tablet (20 mEq total) by mouth daily. 30 tablet 11  . spironolactone (ALDACTONE) 25 MG tablet Take 1  tablet (25 mg total) by mouth daily. 30 tablet 11   No current facility-administered medications for this encounter.    Vitals:   07/10/17 0856  BP: (!) 166/102  Pulse: 73  SpO2: 97%  Weight: 199 lb 9.6 oz (90.5 kg)   Wt Readings from Last 3 Encounters:  07/10/17 199 lb 9.6 oz (90.5 kg)  06/12/17 198 lb 6.4 oz (90 kg)  06/05/17 197 lb 1.5 oz (89.4 kg)    PHYSICAL EXAM: General: Well appearing. No resp difficulty. HEENT: Normal Neck: Supple. JVP 5-6. Carotids 2+ bilat; no bruits. No thyromegaly or nodule noted. Cor: PMI nondisplaced. RRR, No M/G/R noted Lungs: CTAB, normal effort. Abdomen: Soft, non-tender, non-distended, no HSM. No bruits or masses. +BS  Extremities: No cyanosis, clubbing, or rash. R and LLE no edema.  Neuro: Alert & orientedx3, cranial nerves grossly intact. moves all 4 extremities w/o difficulty. Affect pleasant   EKG: NSR 71 bpm, LBBB QRS 142 ms, Last visit.   ASSESSMENT & PLAN:  1. Chronic Systolic HF due to tachy-induced CM in the setting of recurrent AF in setting of viral illness - Echo 03/28/2017 LVEF ~20%, worsened from Echo 2/18 EF 45-50% in setting of Afib RVR.  - Echo 06/26/17 LVEF 30-35%, Grade 1 DD, trivial AI - NYHA II - Volume status stable on exam - Continue lasix 40 mg daily for now.  - Continue spiro 25 mg daily.  - Increase losartan to 25 mg BID. He has refused Entresto with cost ( unable to mitigate due to high co-pay -> $135 per month. Will have Pharm-D recheck.) - Continue digoxin 0.125 mg daily - Has been off BB with low output. Consider at next visit.  -  Reinforced fluid restriction to < 2 L daily, sodium restriction to less than 2000 mg daily, and the importance of daily weights.   2. PAF  - Regular on exam.  - Continue amiodarone 200 mg BID for now.  - He seems to be going in and out of Afib by history and observation in hospital.  - Now on Eliquis without bleeding. No change.  - Consider AF ablation referral now that EF has  improved.  3. h/o RP bleed - Resolved. - No s/s of bleeding on Eliquis.  4. COPD - Sees Dr. Sherene Sires (Previosly Dr. Jamison Neighbor) - Continue spiriva. Limit ventolin to as needed.  - Has stopped smoking.  5. H/o Transient loss of R peripheral vision - Concerning for embolic CVA in setting of AF - Pt declines Neuro referral, MRI, and/or carotid dopplers. No change. If recurs will re-engage. - Now on Eliquis as above.  6. HTN - Elevated today. Meds as above.  7. LBBB - Stable on recent EKG.  8. ED - OK to use low dose sildenafil 25 mg prn.  Doing well overall. Meds and labs as above. RTC 4 weeks.   Graciella Freer, PA-C  8:58 AM   Greater than 50% of the 25 minute visit was spent in counseling/coordination of care regarding disease state education, salt/fluid restriction, sliding scale diuretics, and medication compliance.

## 2017-07-11 ENCOUNTER — Encounter (HOSPITAL_COMMUNITY): Payer: Medicare Other

## 2017-07-13 ENCOUNTER — Encounter (HOSPITAL_COMMUNITY): Payer: Medicare Other

## 2017-07-16 ENCOUNTER — Encounter (HOSPITAL_COMMUNITY): Payer: Medicare Other

## 2017-07-18 ENCOUNTER — Encounter (HOSPITAL_COMMUNITY): Payer: Medicare Other

## 2017-07-20 ENCOUNTER — Encounter (HOSPITAL_COMMUNITY): Payer: Medicare Other

## 2017-07-23 ENCOUNTER — Encounter (HOSPITAL_COMMUNITY): Payer: Medicare Other

## 2017-07-25 ENCOUNTER — Encounter (HOSPITAL_COMMUNITY): Payer: Medicare Other

## 2017-07-27 ENCOUNTER — Encounter (HOSPITAL_COMMUNITY): Payer: Medicare Other

## 2017-07-30 ENCOUNTER — Encounter (HOSPITAL_COMMUNITY): Payer: Medicare Other

## 2017-08-01 ENCOUNTER — Encounter (HOSPITAL_COMMUNITY): Payer: Medicare Other

## 2017-08-03 ENCOUNTER — Encounter (HOSPITAL_COMMUNITY): Payer: Medicare Other

## 2017-08-06 ENCOUNTER — Encounter (HOSPITAL_COMMUNITY): Payer: Medicare Other

## 2017-08-08 ENCOUNTER — Encounter (HOSPITAL_COMMUNITY): Payer: Medicare Other

## 2017-08-09 ENCOUNTER — Ambulatory Visit (HOSPITAL_COMMUNITY)
Admission: RE | Admit: 2017-08-09 | Discharge: 2017-08-09 | Disposition: A | Discharge status: Home / Self Care | Payer: Medicare Other | Source: Ambulatory Visit | Attending: Internal Medicine | Admitting: Internal Medicine

## 2017-08-09 ENCOUNTER — Other Ambulatory Visit: Payer: Self-pay

## 2017-08-09 ENCOUNTER — Encounter (HOSPITAL_COMMUNITY): Payer: Self-pay | Admitting: Internal Medicine

## 2017-08-09 VITALS — BP 132/76 | HR 68 | Wt 203.5 lb

## 2017-08-09 DIAGNOSIS — Z7951 Long term (current) use of inhaled steroids: Secondary | ICD-10-CM | POA: Diagnosis not present

## 2017-08-09 DIAGNOSIS — Z8673 Personal history of transient ischemic attack (TIA), and cerebral infarction without residual deficits: Secondary | ICD-10-CM | POA: Diagnosis not present

## 2017-08-09 DIAGNOSIS — Z833 Family history of diabetes mellitus: Secondary | ICD-10-CM | POA: Diagnosis not present

## 2017-08-09 DIAGNOSIS — Z7901 Long term (current) use of anticoagulants: Secondary | ICD-10-CM | POA: Insufficient documentation

## 2017-08-09 DIAGNOSIS — I2510 Atherosclerotic heart disease of native coronary artery without angina pectoris: Secondary | ICD-10-CM | POA: Insufficient documentation

## 2017-08-09 DIAGNOSIS — I10 Essential (primary) hypertension: Secondary | ICD-10-CM | POA: Diagnosis not present

## 2017-08-09 DIAGNOSIS — Z8279 Family history of other congenital malformations, deformations and chromosomal abnormalities: Secondary | ICD-10-CM | POA: Diagnosis not present

## 2017-08-09 DIAGNOSIS — Z8042 Family history of malignant neoplasm of prostate: Secondary | ICD-10-CM | POA: Diagnosis not present

## 2017-08-09 DIAGNOSIS — I480 Paroxysmal atrial fibrillation: Secondary | ICD-10-CM | POA: Diagnosis not present

## 2017-08-09 DIAGNOSIS — I447 Left bundle-branch block, unspecified: Secondary | ICD-10-CM | POA: Diagnosis not present

## 2017-08-09 DIAGNOSIS — Z8249 Family history of ischemic heart disease and other diseases of the circulatory system: Secondary | ICD-10-CM | POA: Insufficient documentation

## 2017-08-09 DIAGNOSIS — Z87891 Personal history of nicotine dependence: Secondary | ICD-10-CM | POA: Insufficient documentation

## 2017-08-09 DIAGNOSIS — I5022 Chronic systolic (congestive) heart failure: Secondary | ICD-10-CM | POA: Diagnosis not present

## 2017-08-09 DIAGNOSIS — Z79899 Other long term (current) drug therapy: Secondary | ICD-10-CM | POA: Diagnosis not present

## 2017-08-09 DIAGNOSIS — I110 Hypertensive heart disease with heart failure: Secondary | ICD-10-CM | POA: Insufficient documentation

## 2017-08-09 DIAGNOSIS — J449 Chronic obstructive pulmonary disease, unspecified: Secondary | ICD-10-CM | POA: Insufficient documentation

## 2017-08-09 DIAGNOSIS — I428 Other cardiomyopathies: Secondary | ICD-10-CM | POA: Diagnosis not present

## 2017-08-09 DIAGNOSIS — M1990 Unspecified osteoarthritis, unspecified site: Secondary | ICD-10-CM | POA: Insufficient documentation

## 2017-08-09 MED ORDER — LOSARTAN POTASSIUM 100 MG PO TABS: 100.0000 mg | ORAL_TABLET | Freq: Every day | ORAL | 3 refills | Status: DC

## 2017-08-09 NOTE — Patient Instructions (Signed)
STOP Digoxin.  INCREASE Losartan to 100mg  daily.  Follow up and echocardiogram with Dr.Bensimhon in 3 months.

## 2017-08-09 NOTE — Addendum Note (Signed)
Encounter addended by: Modesta Messing, CMA on: 08/09/2017 10:48 AM  Actions taken: Medication long-term status modified, Order list changed, Diagnosis association updated, Sign clinical note

## 2017-08-09 NOTE — Progress Notes (Signed)
Advanced Heart Failure Clinic Note   Patient ID: Reginald Hahn, male   DOB: 11/21/1950, 67 y.o.   MRN: [id number] Primary Cardiologist: Dr. Gala Romney   HPI: [name] "Reginald Hahn" is a 67 y.o. male with h/o COPD admitted in 6/17 with acute systolic HF, AF with RVR and cardiogenic shock. LBBB  Patient was admitted 6/17 with acute systolic HF and new onset afib with RVR and EF found to be 15%.Felt to to have viral CM exacerbated by afib. Diuresed slowly so PICC placed and co-ox low. Started on IV amio and milrinone and subsequently diuresed well. Was pending cath and TEE/DC-CV but developed massive RP bleed and was transferred to ICU.   All anti-coagulants held. Received multiple transfusions. Subsequently recovered. During that time also had loss of peripheral vision in R eye. Head CT negative. Pre discharge had Myoview with EF 30-44%.  Inferior infarct vs gut attenuation. No ischemia.    Pulmonary Clinic with Dr. Jamison Neighbor and told he has severe COPD FEV1 1.83 (49%) FEF 25-75% 0.60L  DLCO 42%.   Pt admitted from 12/17 -> 04/10/17 with complicated admission consisting of AF with RVR, low output CHF, and URI. Pt transiently required milrinone.  After long discussion pt agreed to be placed on Eliquis, so was then candidate for DCCV with TEE. Pt failed DCCV 04/05/18 initially, but then converted to NSR overnight on IV amidoarone. Pt continued to go in-and-out of Afib throughout his admission. Decision made to send home on continued Amiodarone. Tolerated milrinone wean.   He presents today for follow up. Last visit losartan increased. SBP 120-130s at home. Overall doing really well. SOB is improved. He has no SOB with hills/stairs. No orthopnea/PND. No CP. He had some swelling last week after a salty meal. Resolved with extra lasix. He is walking 2 miles every day. Still working part time as a Nutritional therapist. No bleeding on Eliquis. No CP or dizziness. Weights: gradual increase over few months with improved  appetite, but ~198 lbs at home. Compliant with meds. Not on Entresto due to cost (cost him > $100/month). EF 30-35% in 3/19.  Myoview 6/17  There was no ST segment deviation noted during stress.  The left ventricular ejection fraction is moderately decreased (30-44%).  Findings consistent with prior myocardial infarction.  This is an intermediate risk study.  nferior/inferolateral defect (moderate) that does not improve in the rest images consistent with scar and possible soft tissue attenuation. No significant ischemia.  RHC/LHC 08/29/2016 RA = 6 RV = 40/8 PA = 40/13 (24) PCW = 19 Ao = 104/82 (93) LV = 141/17 Fick cardiac output/index = 4.8/2.3 PVR = 1.0 WU SVR 1437  Ao sat = 90% PA sat = 62%, 63% Assessment: 1. Minimal non-obstructive CAD 2. Severe nonischemic CM with EF 25% by cath (EF likely underestimated on re-look likely 40%) 3. Relatively well compensated filling pressures with mild pulmonary venous HTN  Echo 8/17 EF 45% Echo 2/18 EF 45-50% Echo 12/18 EF 10-15% Echo 2/19 EF 25-30% Echo 3/19 EF 30-35%  Review of systems complete and found to be negative unless listed in HPI.    Past Medical History:  Diagnosis Date  . Allergic rhinitis   . Allergy    seasonal allergies  . Arthritis   . Atrial fibrillation (HCC) 03/2017  . Black widow spider bite   . CHF (congestive heart failure) (HCC)   . Chronic systolic heart failure (HCC) 09/16/2015   Possibly tachycardia mediated // Echo 12/18: EF 20-25, trivial AI, mild RAE, PASP  35  . COPD (chronic obstructive pulmonary disease) (HCC)   . Hypertension   . Stroke (cerebrum) (HCC)   . Tobacco use    x40 years. 1 ppd   SH:  Social History   Socioeconomic History  . Marital status: Divorced    Spouse name: Not on file  . Number of children: Not on file  . Years of education: Not on file  . Highest education level: Not on file  Occupational History  . Not on file  Social Needs  . Financial resource strain:  Somewhat hard  . Food insecurity:    Worry: Not on file    Inability: Not on file  . Transportation needs:    Medical: Not on file    Non-medical: Not on file  Tobacco Use  . Smoking status: Former Smoker    Packs/day: 2.00    Years: 50.00    Pack years: 100.00    Types: Cigarettes    Start date: 04/14/1963    Last attempt to quit: 10/09/2015    Years since quitting: 1.8  . Smokeless tobacco: Never Used  . Tobacco comment: stopped for a couple of years in his 30s  Substance and Sexual Activity  . Alcohol use: No    Alcohol/week: 0.0 oz  . Drug use: No    Comment: Used to smoke marijuana  . Sexual activity: Not on file  Lifestyle  . Physical activity:    Days per week: 0 days    Minutes per session: 0 min  . Stress: Not on file  Relationships  . Social connections:    Talks on phone: Not on file    Gets together: Not on file    Attends religious service: Not on file    Active member of club or organization: Not on file    Attends meetings of clubs or organizations: Not on file    Relationship status: Not on file  . Intimate partner violence:    Fear of current or ex partner: Not on file    Emotionally abused: Not on file    Physically abused: Not on file    Forced sexual activity: Not on file  Other Topics Concern  . 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 No pets currently. Remote exposure to a parrot for 2 years.     FH:  Family History  Problem Relation Age of Onset  . Heart disease Mother        died in her 55's.  . Diabetes Mother   . Prostate cancer Father        died in his 54's.  . Congenital heart disease Brother        died @ age 108.  . Lung disease Neg Hx     Past Medical History:    Diagnosis Date  . Allergic rhinitis   . Allergy    seasonal allergies  . Arthritis   . Atrial fibrillation (HCC) 03/2017  . Black widow spider bite   . CHF (congestive heart failure) (HCC)   . Chronic systolic heart failure (HCC) 09/16/2015   Possibly tachycardia mediated // Echo 12/18: EF 20-25, trivial AI, mild RAE, PASP 35  .  COPD (chronic obstructive pulmonary disease) (HCC)   . Hypertension   . Stroke (cerebrum) (HCC)   . Tobacco use    x40 years. 1 ppd    Current Outpatient Medications  Medication Sig Dispense Refill  . acetaminophen (TYLENOL) 325 MG tablet Take 650 mg by mouth every 6 (six) hours as needed for mild pain or fever.    Marland Kitchen albuterol (PROVENTIL HFA;VENTOLIN HFA) 108 (90 Base) MCG/ACT inhaler Inhale 2 puffs into the lungs every 4 (four) hours as needed for wheezing or shortness of breath. 1 Inhaler 5  . amiodarone (PACERONE) 200 MG tablet Take 200 mg by mouth daily.    Marland Kitchen apixaban (ELIQUIS) 5 MG TABS tablet Take 1 tablet (5 mg total) by mouth 2 (two) times daily. 60 tablet 11  . budesonide-formoterol (SYMBICORT) 160-4.5 MCG/ACT inhaler Inhale 2 puffs 2 (two) times daily into the lungs. 1 Inhaler 6  . cetirizine (ZYRTEC) 10 MG tablet Take 10 mg by mouth daily.    . digoxin (LANOXIN) 0.125 MG tablet Take 1 tablet (0.125 mg total) by mouth daily. 30 tablet 11  . furosemide (LASIX) 40 MG tablet Take 40 mg by mouth daily.    Marland Kitchen losartan (COZAAR) 25 MG tablet Take 1 tablet (25 mg total) by mouth 2 (two) times daily. 60 tablet 11  . mometasone-formoterol (DULERA) 100-5 MCG/ACT AERO Inhale 2 puffs into the lungs 2 (two) times daily.    . Multiple Vitamin (MULTIVITAMIN WITH MINERALS) TABS tablet Take 1 tablet by mouth daily.    Marland Kitchen omeprazole (PRILOSEC) 20 MG capsule Take 20 mg by mouth every morning.    . OXYGEN Inhale 1 L into the lungs as needed (Shortness of breath).    . potassium chloride SA (K-DUR,KLOR-CON) 20 MEQ tablet Take 1 tablet (20 mEq total) by mouth daily. 30  tablet 11  . spironolactone (ALDACTONE) 25 MG tablet Take 1 tablet (25 mg total) by mouth daily. 30 tablet 11   No current facility-administered medications for this encounter.    Vitals:   08/09/17 0959  BP: 132/76  Pulse: 68  SpO2: 98%  Weight: 203 lb 8 oz (92.3 kg)   Wt Readings from Last 3 Encounters:  08/09/17 203 lb 8 oz (92.3 kg)  07/10/17 199 lb 9.6 oz (90.5 kg)  06/18/17 198 lb 6.6 oz (90 kg)    PHYSICAL EXAM: General: Well appearing. No resp difficulty. HEENT: Normal anicteric Neck: Supple. JVP 5-6. Carotids 2+ bilat; no bruits. No thyromegaly or nodule noted. Cor: PMI nondisplaced. RRR, No M/G/R noted. Distant.  Lungs: coarse throughout. No wheeze  Abdomen: Soft, non-tender, non-distended, no HSM. No bruits or masses. +BS  Extremities: No cyanosis, clubbing, or rash. R and LLE no edema.  Neuro: alert & oriented x 3, cranial nerves grossly intact. moves all 4 extremities w/o difficulty. Affect pleasant    ASSESSMENT & PLAN:  1. Chronic Systolic HF due to tachy-induced CM in the setting of recurrent AF in setting of viral illness - Echo 03/28/2017 LVEF ~20%, worsened from Echo 2/18 EF 45-50% in setting of Afib RVR.  - Echo 06/26/17 LVEF 30-35%, Grade 1 DD, trivial AI - NYHA I-II - Volume status stable on exam. Weight up 4 lbs, but doesn't seem to be fluid  - Continue lasix 40 mg daily  - Continue spiro 25 mg daily.  - Continue losartan to 25 mg BID. He has refused Entresto with cost (unable to mitigate due to high co-pay -> $135 per month) - Continue digoxin 0.125  mg daily. Dig level 0.6 04/2017 - Has been off BB with low output. Consider at next visit.  - Reinforced fluid restriction to < 2 L daily, sodium restriction to less than 2000 mg daily, and the importance of daily weights.   2. PAF  - Regular on exam. - Continue amiodarone 200 mg daily - He seems to be going in and out of Afib by history and observation in hospital.  - Now on Eliquis without bleeding.  No change.  - Consider AF ablation referral now that EF has improved.  3. h/o RP bleed - Resolved. - No s/s bleeding on Eliquis.  4. COPD - Sees Dr. Sherene Sires (Previosly Dr. Jamison Neighbor) - Continue spiriva. Limit ventolin to as needed.  - Has stopped smoking.  5. H/o Transient loss of R peripheral vision - Concerning for embolic CVA in setting of AF - Pt declines Neuro referral, MRI, and/or carotid dopplers. No change. - Now on Eliquis as above.  6. HTN - Improved today. SBP 120-130s at home.  7. LBBB - Stable on recent EKG. QRS ~142 ms 8. ED - Continue low dose sildenafil 25 mg prn.  BMET Beta blocker ?Echo at next visit AF ablation?  Alford Highland, NP  10:01 AM   Patient seen and examined with the above-signed Advanced Practice Provider and/or Housestaff. I personally reviewed laboratory data, imaging studies and relevant notes. I independently examined the patient and formulated the important aspects of the plan. I have edited the note to reflect any of my changes or salient points. I have personally discussed the plan with the patient and/or family.  Overall doing well. NYHA II. Volume status looks good. EF improving with maintenance of NSR. Tolerating Eliquis well. BP slightly elevated. Will increase losartan to 100 daily. Stop digoxin. Consider bisoprolol at next visit,. Discussed strategies for AF (contiue low-dose amio vs switch AA vs EP for AF ablation). Willing to consider AF ablation but wants to talk to family first. We will see back in 3-4 months with repeat echo.   Arvilla Meres, MD  10:40 AM

## 2017-08-10 ENCOUNTER — Encounter (HOSPITAL_COMMUNITY): Payer: Medicare Other

## 2017-08-13 ENCOUNTER — Encounter (HOSPITAL_COMMUNITY): Payer: Medicare Other

## 2017-08-15 ENCOUNTER — Encounter (HOSPITAL_COMMUNITY): Payer: Medicare Other

## 2017-08-17 ENCOUNTER — Encounter (HOSPITAL_COMMUNITY): Payer: Medicare Other

## 2017-08-20 ENCOUNTER — Encounter (HOSPITAL_COMMUNITY): Payer: Medicare Other

## 2017-08-22 ENCOUNTER — Encounter (HOSPITAL_COMMUNITY): Payer: Medicare Other

## 2017-08-24 ENCOUNTER — Encounter (HOSPITAL_COMMUNITY): Payer: Medicare Other

## 2017-08-27 ENCOUNTER — Encounter (HOSPITAL_COMMUNITY): Payer: Medicare Other

## 2017-08-29 ENCOUNTER — Encounter (HOSPITAL_COMMUNITY): Payer: Medicare Other

## 2017-08-31 ENCOUNTER — Encounter (HOSPITAL_COMMUNITY): Payer: Medicare Other

## 2017-10-10 ENCOUNTER — Telehealth: Payer: Self-pay | Admitting: Internal Medicine

## 2017-10-10 MED ORDER — MOMETASONE FURO-FORMOTEROL FUM 100-5 MCG/ACT IN AERO: 2.0000 | INHALATION_SPRAY | Freq: Two times a day (BID) | RESPIRATORY_TRACT | 0 refills | Status: DC

## 2017-10-10 MED ORDER — MOMETASONE FURO-FORMOTEROL FUM 100-5 MCG/ACT IN AERO: 2.0000 | INHALATION_SPRAY | Freq: Two times a day (BID) | RESPIRATORY_TRACT | 5 refills | Status: DC

## 2017-10-10 NOTE — Telephone Encounter (Signed)
PA initiated via [email or web address] for [facility].  Key: [id number] PA Case ID: [id number]  Stated it could take 24-72 hours to receive determination of PA.  Will route to [place].

## 2017-10-10 NOTE — Telephone Encounter (Signed)
Patient came to office for [facility] Sample. Provided pt with dulera sample (1) today, and sent in RX of Dulera Inhaler to pharmacy. Nothing further needed at this time.

## 2017-10-12 NOTE — Telephone Encounter (Signed)
Per CMM- PA is still pending.  

## 2017-10-15 ENCOUNTER — Telehealth: Payer: Self-pay | Admitting: Internal Medicine

## 2017-10-15 MED ORDER — BUDESONIDE-FORMOTEROL FUMARATE 80-4.5 MCG/ACT IN AERO: 2.0000 | INHALATION_SPRAY | Freq: Two times a day (BID) | RESPIRATORY_TRACT | 0 refills | Status: DC

## 2017-10-15 NOTE — Telephone Encounter (Signed)
No Dulera samples in the office 1 Symbicort 80 sample obtained  Called spoke with patient to discuss MW's recommendations Pt okay with the Symbicort sample and making appt with formulary to discuss, however he was driving and unable to schedule appt at time of call.  Pt stated he will come to pick up sample this afternoon and schedule appt while here at the office.  Sample documented per protocol Nothing further needed at this time; will sign off

## 2017-10-15 NOTE — Telephone Encounter (Signed)
Received denial on Dulera The only alternative they listed was [facility] Bouquet  Pt's next scheduled appt 11/26/17  Please advise thanks

## 2017-10-15 NOTE — Telephone Encounter (Signed)
Reviewed the info and it is not the info we need to pick the best option - that would be a drug formulary that he will need to acquire from his insurance company and provide it to me at time of ov - I just need the list of alternatives for copd / asthma meds and the formulary is the best place to find it

## 2017-10-15 NOTE — Telephone Encounter (Signed)
If we don't have any dulera 100 give him  A sample of symbicort 80 and have him see my or NP before this runs out with his drug formulary in hand

## 2017-10-15 NOTE — Telephone Encounter (Signed)
Placed in Dr Wert's lookat 

## 2017-10-16 NOTE — Telephone Encounter (Signed)
Called spoke with patient to discuss MW's recommendations  Patient became slightly agitated on the phone stating that he is not "computer literate" and does not know how to request a formulary.  Patient stated that the packet that he dropped off yesterday has a number on it that we can use to request a formulary.  Called [employer] on behalf of the patient @ [phone] After 19 minutes, was able to speak with the right dept >> insurance formulary requested and will be mailed to patient's home address; this could take up to 2 weeks  Called spoke with patient, informed him of the above Pt cancelled his 7.10.19 appt with MW as this is no necessary without the formulary and will keep the 8.19.19 appt and will bring his formulary to this appt  Patient stated that the information he brought to the office yesterday can be thrown away as he has another copy at home Information placed in the shred bin  Nothing further needed at this time; will sign off

## 2017-10-17 ENCOUNTER — Ambulatory Visit: Payer: Medicare Other | Admitting: Internal Medicine

## 2017-11-12 ENCOUNTER — Ambulatory Visit (HOSPITAL_COMMUNITY)
Admission: RE | Admit: 2017-11-12 | Discharge: 2017-11-12 | Disposition: A | Discharge status: Home / Self Care | Payer: Medicare Other | Source: Ambulatory Visit | Attending: Internal Medicine | Admitting: Internal Medicine

## 2017-11-12 ENCOUNTER — Other Ambulatory Visit: Payer: Self-pay

## 2017-11-12 ENCOUNTER — Ambulatory Visit (HOSPITAL_BASED_OUTPATIENT_CLINIC_OR_DEPARTMENT_OTHER)
Admission: RE | Admit: 2017-11-12 | Discharge: 2017-11-12 | Disposition: A | Discharge status: Home / Self Care | Payer: Medicare Other | Source: Ambulatory Visit | Attending: Internal Medicine | Admitting: Internal Medicine

## 2017-11-12 ENCOUNTER — Encounter (HOSPITAL_COMMUNITY): Payer: Self-pay | Admitting: Internal Medicine

## 2017-11-12 VITALS — BP 140/72 | HR 72 | Wt 215.0 lb

## 2017-11-12 DIAGNOSIS — I5022 Chronic systolic (congestive) heart failure: Secondary | ICD-10-CM

## 2017-11-12 DIAGNOSIS — I480 Paroxysmal atrial fibrillation: Secondary | ICD-10-CM | POA: Diagnosis not present

## 2017-11-12 DIAGNOSIS — I10 Essential (primary) hypertension: Secondary | ICD-10-CM

## 2017-11-12 DIAGNOSIS — I4891 Unspecified atrial fibrillation: Secondary | ICD-10-CM | POA: Diagnosis not present

## 2017-11-12 DIAGNOSIS — I110 Hypertensive heart disease with heart failure: Secondary | ICD-10-CM | POA: Insufficient documentation

## 2017-11-12 DIAGNOSIS — J449 Chronic obstructive pulmonary disease, unspecified: Secondary | ICD-10-CM | POA: Diagnosis not present

## 2017-11-12 DIAGNOSIS — Z720 Tobacco use: Secondary | ICD-10-CM | POA: Diagnosis not present

## 2017-11-12 LAB — CBC
HCT: 49.7 % (ref 39.0–52.0)
HEMOGLOBIN: 15.9 g/dL (ref 13.0–17.0)
MCH: 30.8 pg (ref 26.0–34.0)
MCHC: 32 g/dL (ref 30.0–36.0)
MCV: 96.3 fL (ref 78.0–100.0)
Platelets: 253 10*3/uL (ref 150–400)
RBC: 5.16 MIL/uL (ref 4.22–5.81)
RDW: 12.8 % (ref 11.5–15.5)
WBC: 11.2 10*3/uL — ABNORMAL HIGH (ref 4.0–10.5)

## 2017-11-12 LAB — BASIC METABOLIC PANEL
ANION GAP: 10 (ref 5–15)
BUN: 14 mg/dL (ref 8–23)
CALCIUM: 9.7 mg/dL (ref 8.9–10.3)
CO2: 26 mmol/L (ref 22–32)
Chloride: 102 mmol/L (ref 98–111)
Creatinine, Ser: 1.23 mg/dL (ref 0.61–1.24)
GFR, EST NON AFRICAN AMERICAN: 59 mL/min — AB (ref >60)
GLUCOSE: 70 mg/dL (ref 70–99)
Potassium: 4.2 mmol/L (ref 3.5–5.1)
Sodium: 138 mmol/L (ref 135–145)

## 2017-11-12 MED ORDER — BISOPROLOL FUMARATE 5 MG PO TABS: 2.5000 mg | ORAL_TABLET | Freq: Every day | ORAL | 6 refills | Status: DC

## 2017-11-12 MED ORDER — LOSARTAN POTASSIUM 100 MG PO TABS: 100.0000 mg | ORAL_TABLET | Freq: Every day | ORAL | 6 refills | Status: DC

## 2017-11-12 NOTE — Progress Notes (Signed)
Advanced Heart Failure Clinic Note   Patient ID: Reginald Hahn, male   DOB: 1950-11-30, 67 y.o.   MRN: [id number] Primary Cardiologist: Dr. Gala Romney   HPI: [name] "Reginald Hahn" is a 67 y.o. male with h/o COPD admitted in 6/17 with acute systolic HF, AF with RVR and cardiogenic shock. LBBB  Patient was admitted 6/17 with acute systolic HF and new onset afib with RVR and EF found to be 15%.Felt to to have viral CM exacerbated by afib. Diuresed slowly so PICC placed and co-ox low. Started on IV amio and milrinone and subsequently diuresed well. Was pending cath and TEE/DC-CV but developed massive RP bleed and was transferred to ICU.   All anti-coagulants held. Received multiple transfusions. Subsequently recovered. During that time also had loss of peripheral vision in R eye. Head CT negative. Pre discharge had Myoview with EF 30-44%.  Inferior infarct vs gut attenuation. No ischemia.    Pulmonary Clinic with Dr. Jamison Neighbor and told he has severe COPD FEV1 1.83 (49%) FEF 25-75% 0.60L  DLCO 42%.   Pt admitted from 12/17 -> 04/10/17 with complicated admission consisting of AF with RVR, low output CHF, and URI. Pt transiently required milrinone.  After long discussion pt agreed to be placed on Eliquis, so was then candidate for DCCV with TEE. Pt failed DCCV 04/05/18 initially, but then converted to NSR overnight on IV amidoarone. Pt continued to go in-and-out of Afib throughout his admission. Decision made to send home on continued Amiodarone. Tolerated milrinone wean.    EF 30-35% in 3/19.  He presents today for follow up. Overall feeling ok. Says he gets a little winded when doing a lot of physical exertion. Says his lungs and knee also limit him. Doing a little bit as a Nutritional therapist. Compliant with meds. Has gained about 15-16 pounds. No CP. No edema, orthopnea or PND. Loss of peripheral vision has cleared up.   Last visit losartan increased. SBP 120-130s at home. Overall doing really well. SOB is  improved. He has no SOB with hills/stairs. No orthopnea/PND. No CP. He had some swelling last week after a salty meal. Resolved with extra lasix. He is walking 2 miles every day. Still working part time as a Nutritional therapist. No bleeding on Eliquis. No CP or dizziness. Weights: gradual increase over few months with improved appetite, but ~198 lbs at home. Compliant with meds. Not on Entresto due to cost (cost him > $100/month).  Myoview 6/17  There was no ST segment deviation noted during stress.  The left ventricular ejection fraction is moderately decreased (30-44%).  Findings consistent with prior myocardial infarction.  This is an intermediate risk study.  nferior/inferolateral defect (moderate) that does not improve in the rest images consistent with scar and possible soft tissue attenuation. No significant ischemia.  RHC/LHC 08/29/2016 RA = 6 RV = 40/8 PA = 40/13 (24) PCW = 19 Ao = 104/82 (93) LV = 141/17 Fick cardiac output/index = 4.8/2.3 PVR = 1.0 WU SVR 1437  Ao sat = 90% PA sat = 62%, 63% Assessment: 1. Minimal non-obstructive CAD 2. Severe nonischemic CM with EF 25% by cath (EF likely underestimated on re-look likely 40%) 3. Relatively well compensated filling pressures with mild pulmonary venous HTN  Echo 8/17 EF 45% Echo 2/18 EF 45-50% Echo 12/18 EF 10-15% Echo 2/19 EF 25-30% Echo 3/19 EF 30-35%  Review of systems complete and found to be negative unless listed in HPI.    Past Medical History:  Diagnosis Date  . Allergic rhinitis   .  Allergy    seasonal allergies  . Arthritis   . Atrial fibrillation (HCC) 03/2017  . Black widow spider bite   . CHF (congestive heart failure) (HCC)   . Chronic systolic heart failure (HCC) 09/16/2015   Possibly tachycardia mediated // Echo 12/18: EF 20-25, trivial AI, mild RAE, PASP 35  . COPD (chronic obstructive pulmonary disease) (HCC)   . Hypertension   . Stroke (cerebrum) (HCC)   . Tobacco use    x40 years. 1 ppd   SH:    Social History   Socioeconomic History  . Marital status: Divorced    Spouse name: Not on file  . Number of children: Not on file  . Years of education: Not on file  . Highest education level: Not on file  Occupational History  . Not on file  Social Needs  . Financial resource strain: Somewhat hard  . Food insecurity:    Worry: Not on file    Inability: Not on file  . Transportation needs:    Medical: Not on file    Non-medical: Not on file  Tobacco Use  . Smoking status: Former Smoker    Packs/day: 2.00    Years: 50.00    Pack years: 100.00    Types: Cigarettes    Start date: 04/14/1963    Last attempt to quit: 10/09/2015    Years since quitting: 2.0  . Smokeless tobacco: Never Used  . Tobacco comment: stopped for a couple of years in his 30s  Substance and Sexual Activity  . Alcohol use: No    Alcohol/week: 0.0 oz  . Drug use: No    Comment: Used to smoke marijuana  . Sexual activity: Not on file  Lifestyle  . Physical activity:    Days per week: 0 days    Minutes per session: 0 min  . Stress: Not on file  Relationships  . Social connections:    Talks on phone: Not on file    Gets together: Not on file    Attends religious service: Not on file    Active member of club or organization: Not on file    Attends meetings of clubs or organizations: Not on file    Relationship status: Not on file  . Intimate partner violence:    Fear of current or ex partner: Not on file    Emotionally abused: Not on file    Physically abused: Not on file    Forced sexual activity: Not on file  Other Topics Concern  . 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 No pets currently. Remote exposure to a parrot for 2 years.      FH:  Family History  Problem Relation Age of Onset  . Heart disease Mother        died in her 45's.  . Diabetes Mother   . Prostate cancer Father        died in his 80's.  . Congenital heart disease Brother        died @ age 49.  . Lung disease Neg Hx     Past Medical History:  Diagnosis Date  . Allergic rhinitis   . Allergy  seasonal allergies  . Arthritis   . Atrial fibrillation (HCC) 03/2017  . Black widow spider bite   . CHF (congestive heart failure) (HCC)   . Chronic systolic heart failure (HCC) 09/16/2015   Possibly tachycardia mediated // Echo 12/18: EF 20-25, trivial AI, mild RAE, PASP 35  . COPD (chronic obstructive pulmonary disease) (HCC)   . Hypertension   . Stroke (cerebrum) (HCC)   . Tobacco use    x40 years. 1 ppd    Current Outpatient Medications  Medication Sig Dispense Refill  . acetaminophen (TYLENOL) 325 MG tablet Take 650 mg by mouth every 6 (six) hours as needed for mild pain or fever.    Marland Kitchen albuterol (PROVENTIL HFA;VENTOLIN HFA) 108 (90 Base) MCG/ACT inhaler Inhale 2 puffs into the lungs every 4 (four) hours as needed for wheezing or shortness of breath. 1 Inhaler 5  . amiodarone (PACERONE) 200 MG tablet Take 200 mg by mouth daily.    Marland Kitchen apixaban (ELIQUIS) 5 MG TABS tablet Take 1 tablet (5 mg total) by mouth 2 (two) times daily. 60 tablet 11  . budesonide-formoterol (SYMBICORT) 160-4.5 MCG/ACT inhaler Inhale 2 puffs 2 (two) times daily into the lungs. 1 Inhaler 6  . cetirizine (ZYRTEC) 10 MG tablet Take 10 mg by mouth daily.    . furosemide (LASIX) 40 MG tablet Take 40 mg by mouth daily.    Marland Kitchen losartan (COZAAR) 100 MG tablet Take 1 tablet (100 mg total) by mouth daily. 30 tablet 3  . mometasone-formoterol (DULERA) 100-5 MCG/ACT AERO Inhale 2 puffs into the lungs 2 (two) times daily.    . mometasone-formoterol (DULERA) 100-5 MCG/ACT AERO Inhale 2 puffs into the lungs 2 (two) times daily. 1 Inhaler 5  . Multiple Vitamin (MULTIVITAMIN WITH  MINERALS) TABS tablet Take 1 tablet by mouth daily.    Marland Kitchen omeprazole (PRILOSEC) 20 MG capsule Take 20 mg by mouth every morning.    . OXYGEN Inhale 1 L into the lungs as needed (Shortness of breath).    . potassium chloride SA (K-DUR,KLOR-CON) 20 MEQ tablet Take 1 tablet (20 mEq total) by mouth daily. 30 tablet 11  . spironolactone (ALDACTONE) 25 MG tablet Take 1 tablet (25 mg total) by mouth daily. 30 tablet 11   No current facility-administered medications for this encounter.    Vitals:   11/12/17 0937  BP: 140/72  Pulse: 72  SpO2: 96%  Weight: 215 lb (97.5 kg)   Wt Readings from Last 3 Encounters:  11/12/17 215 lb (97.5 kg)  08/09/17 203 lb 8 oz (92.3 kg)  07/10/17 199 lb 9.6 oz (90.5 kg)    PHYSICAL EXAM: General:  Well appearing. No resp difficulty HEENT: normal Neck: supple. no JVD. Carotids 2+ bilat; no bruits. No lymphadenopathy or thryomegaly appreciated. Cor: PMI nondisplaced. Regular rate & rhythm. No rubs, gallops or murmurs. Lungs: clear mildly decreased throughout Abdomen: soft, nontender, nondistended. No hepatosplenomegaly. No bruits or masses. Good bowel sounds. Extremities: no cyanosis, clubbing, rash, edema Neuro: alert & orientedx3, cranial nerves grossly intact. moves all 4 extremities w/o difficulty. Affect pleasant   ECG: NSR 69 1AVB 264 ms LBBB 140 ms Personally reviewed   ASSESSMENT & PLAN:  1. Chronic Systolic HF due to tachy-induced CM in the setting of recurrent AF in setting of viral illness - Echo 03/28/2017 LVEF ~20%, worsened from Echo 2/18 EF 45-50% in setting of Afib RVR.  - Echo 06/26/17 LVEF 30-35%, Grade 1 DD, trivial AI - Echo today EF 40-45% with LBBB  -  Stable NYHA I-II - Volume status stable on exam. Weight up 15 pounds but not due to fluid - Continue lasix 40 mg daily  - Continue spiro 25 mg daily.  - Continue losartan to 100 mg daily. He has refused Entresto with cost (unable to mitigate due to high co-pay -> $135 per month) - we  will recheck with PharmD today - Stop digoxin - Start bisoprolol 2.5 - Reinforced fluid restriction to < 2 L daily, sodium restriction to less than 2000 mg daily, and the importance of daily weights.   2. PAF  - Remains in NSR - Continue amiodarone 200 mg daily - Now on Eliquis without bleeding. No change.  - Will refer to EP to discuss possible AF ablation as he has had 2 life-threatening episodes of cardiogenic shock in setting of PAF 3. h/o RP bleed - Resolved. - No s/s bleeding on Eliquis.  4. COPD - Sees Dr. Sherene Sires - Continue spiriva. Limit ventolin to as needed.  - Has stopped smoking.  5. H/o Transient loss of R peripheral vision - Concerning for embolic CVA in setting of AF. Has resolved.  - Pt declines Neuro referral, MRI, and/or carotid dopplers. No change. - Now on Eliquis as above.  6. HTN - Blood pressure well controlled. Continue current regimen. 7. LBBB - Stable on recent EKG. QRS ~140 ms 8. ED - Continue low dose sildenafil 25 mg prn.   Arvilla Meres, MD  9:51 AM

## 2017-11-12 NOTE — Patient Instructions (Signed)
Stop Digoxin  Start Bisoprolol 2.5 mg (1/2 tab) daily  Labs today  You have been referred to Dr Johney Frame, his office will call you for an appointment  Your physician recommends that you schedule a follow-up appointment in: 4 months

## 2017-11-12 NOTE — Progress Notes (Signed)
2D Echocardiogram has been performed.  Reginald Hahn 11/12/2017, 9:34 AM

## 2017-11-12 NOTE — Addendum Note (Signed)
Encounter addended by: Noralee Space, RN on: 11/12/2017 10:18 AM  Actions taken: Visit diagnoses modified, Order list changed, Diagnosis association updated, Sign clinical note

## 2017-11-22 ENCOUNTER — Telehealth: Payer: Self-pay | Admitting: Internal Medicine

## 2017-11-22 ENCOUNTER — Ambulatory Visit: Payer: Medicare Other | Admitting: Internal Medicine

## 2017-11-22 MED ORDER — BUDESONIDE-FORMOTEROL FUMARATE 160-4.5 MCG/ACT IN AERO: 2.0000 | INHALATION_SPRAY | Freq: Two times a day (BID) | RESPIRATORY_TRACT | 0 refills | Status: DC

## 2017-11-22 NOTE — Telephone Encounter (Signed)
Ok to give one sample as long as he understands it's the same drug as dulera but then needs ov before this runs out with all meds in hand to see me or NP to get back on track

## 2017-11-22 NOTE — Telephone Encounter (Signed)
Spoke with the pt and

## 2017-11-22 NOTE — Telephone Encounter (Signed)
Spoke with the pt  He is asking for symbicort 160 sample States he has been on this since the last visit  No mention of this on last avs or overview box  Here is the last avs:   If you start needing your rescue inhaler more than twice daily>>>  then start back on dulera 100 Take up to  2 puffs first thing in am and then another 2 puffs about 12 hours later.      Please remember to go to the  x-ray department downstairs in the basement  for your tests - we will call you with the results when they are available.         Please schedule a follow up visit in  6  months but call sooner if needed  with all medications /inhalers/ solutions in hand so we can verify exactly what you are taking. This includes all medications from all doctors and over the counters   He has ov 11/26/17

## 2017-11-22 NOTE — Telephone Encounter (Signed)
Spoke with the pt and notified of recs per MW  He verbalized understanding  Appt already scheduled  Sample up front for pick up

## 2017-11-26 ENCOUNTER — Other Ambulatory Visit (INDEPENDENT_AMBULATORY_CARE_PROVIDER_SITE_OTHER): Payer: Medicare Other

## 2017-11-26 ENCOUNTER — Ambulatory Visit (INDEPENDENT_AMBULATORY_CARE_PROVIDER_SITE_OTHER)
Admission: RE | Admit: 2017-11-26 | Discharge: 2017-11-26 | Disposition: A | Discharge status: Home / Self Care | Payer: Medicare Other | Source: Ambulatory Visit | Attending: Internal Medicine | Admitting: Internal Medicine

## 2017-11-26 ENCOUNTER — Encounter: Payer: Self-pay | Admitting: Internal Medicine

## 2017-11-26 ENCOUNTER — Ambulatory Visit (INDEPENDENT_AMBULATORY_CARE_PROVIDER_SITE_OTHER): Payer: Medicare Other | Admitting: Internal Medicine

## 2017-11-26 VITALS — BP 106/72 | HR 75 | Ht 72.5 in | Wt 216.0 lb

## 2017-11-26 DIAGNOSIS — R059 Cough, unspecified: Secondary | ICD-10-CM

## 2017-11-26 DIAGNOSIS — R05 Cough: Secondary | ICD-10-CM

## 2017-11-26 DIAGNOSIS — J449 Chronic obstructive pulmonary disease, unspecified: Secondary | ICD-10-CM

## 2017-11-26 LAB — CBC WITH DIFFERENTIAL/PLATELET
BASOS PCT: 1.4 % (ref 0.0–3.0)
Basophils Absolute: 0.2 10*3/uL — ABNORMAL HIGH (ref 0.0–0.1)
EOS ABS: 0.4 10*3/uL (ref 0.0–0.7)
EOS PCT: 3.3 % (ref 0.0–5.0)
HEMATOCRIT: 46 % (ref 39.0–52.0)
Hemoglobin: 15.4 g/dL (ref 13.0–17.0)
Lymphocytes Relative: 12.5 % (ref 12.0–46.0)
Lymphs Abs: 1.4 10*3/uL (ref 0.7–4.0)
MCHC: 33.6 g/dL (ref 30.0–36.0)
MCV: 93.2 fl (ref 78.0–100.0)
Monocytes Absolute: 0.9 10*3/uL (ref 0.1–1.0)
Monocytes Relative: 8.4 % (ref 3.0–12.0)
NEUTROS ABS: 8.3 10*3/uL — AB (ref 1.4–7.7)
Neutrophils Relative %: 74.4 % (ref 43.0–77.0)
PLATELETS: 230 10*3/uL (ref 150.0–400.0)
RBC: 4.93 Mil/uL (ref 4.22–5.81)
RDW: 13.6 % (ref 11.5–15.5)
WBC: 11.1 10*3/uL — ABNORMAL HIGH (ref 4.0–10.5)

## 2017-11-26 LAB — SEDIMENTATION RATE: Sed Rate: 42 mm/hr — ABNORMAL HIGH (ref 0–20)

## 2017-11-26 MED ORDER — BUDESONIDE-FORMOTEROL FUMARATE 160-4.5 MCG/ACT IN AERO: 2.0000 | INHALATION_SPRAY | Freq: Two times a day (BID) | RESPIRATORY_TRACT | 0 refills | Status: DC

## 2017-11-26 MED ORDER — MOMETASONE FURO-FORMOTEROL FUM 100-5 MCG/ACT IN AERO: 2.0000 | INHALATION_SPRAY | Freq: Two times a day (BID) | RESPIRATORY_TRACT | 5 refills | Status: DC

## 2017-11-26 MED ORDER — BUDESONIDE-FORMOTEROL FUMARATE 160-4.5 MCG/ACT IN AERO: 2.0000 | INHALATION_SPRAY | Freq: Two times a day (BID) | RESPIRATORY_TRACT | 12 refills | Status: DC

## 2017-11-26 NOTE — Patient Instructions (Signed)
Please see patient coordinator before you leave today  to schedule sinus ct   Please remember to go to the lab and x-ray department downstairs in the basement  for your tests - we will call you with the results when they are available.     Work on maintaining perfect  inhaler technique:  relax and gently blow all the way out then take a nice smooth deep breath back in, triggering the inhaler at same time you start breathing in.  Hold for up to 5 seconds if you can. Blow out thru nose. Rinse and gargle with water when done and brush/ gargle with arm and Hammer toothpaste   Please schedule a follow up office visit in 4 weeks, sooner if needed  with all medications /inhalers/ solutions in hand so we can verify exactly what you are taking. This includes all medications from all doctors and over the counters

## 2017-11-26 NOTE — Progress Notes (Signed)
Subjective:    Patient ID: Reginald Hahn, male    DOB: 09-17-1950, 67 y.o.   MRN: [id number]    Brief patient profile:  43 yowm MZ quit smoking 10/2015  With GOLD III copd     History of Present Illness  08/31/2016  f/u ov/Reginald Hahn re: MZ/ COPD Gold  II flare of cough > sob x 2 weeks/ Chief Complaint  Patient presents with  . Acute Visit    Increased cough x 2 wks. He is coughing the the point his chest feels sore. He occ produces minima clear sputum. He states he occ coughs until the point the he gets dizzy and passes out. He is using albuterol inhaler 3-4 x per day.   cough had improved on trelegy then gradually worse cough esp at hs  Most dry but quite severe  Doe = MMRC2 = can't walk a nl pace on a flat grade s sob but does fine slow and flat eg shopping rec  Try prilosec otc 20mg   Take 30-60 min before first meal of the day and Pepcid ac (famotidine) 20 mg one @  Bedtime GERD diet  For cough > mucinex dm 1200 mg twice daily as needed and flutter valve when we get one we'll call to train you on it Stop trelegy and start symbicort 80 Take 2 puffs first thing in am and then another 2 puffs about 12 hours later.     05/25/2017  f/u ov/Reginald Hahn re:  Transition of care from  [name] Neighbor no longer on maint rx  GOLD II copd / MZ Chief Complaint  Patient presents with  . Follow-up    COPD-No SOB, slight cough, no wheezing  Dyspnea:  MMRC2 = can't walk a nl pace on a flat grade s sob but does fine slow and flat anywhere he wants to go   Cough: variable min am mucoid  Sleep: no longer on 02 / flat   Does not use any inhalers as maint/ proair maybe once every few weeks rec If you start needing your rescue inhaler more than twice daily>>>  then start back on dulera 100 Take up to  2 puffs first thing in am and then another 2 puffs about 12 hours later.  Please schedule a follow up visit in  6  months but call sooner if needed  with all medications /inhalers/ solutions in hand so we can verify  exactly what you are taking. This includes all medications from all doctors and over the counters    11/26/2017  f/u ov/Reginald Hahn re: GOLD II/ MZ   -  On symb 160 2bid / amiodarone started back Dec 2019  Cough worse x 2 m  Did not bring meds as req  Chief Complaint  Patient presents with  . Follow-up    SOB with activity , Productive cough and clear in color for 2 months now.  Dyspnea:  MMRC2 = can't walk a nl pace on a flat grade s sob but does fine slow and flat walks at [employer] Cough: worse x 2 months esp in am and noct  Sleeping: on back one pillow SABA use: avg once only works for 15 min if that  02: turned 02 back in  Crunching sensation both temples s pain assoc with nasal obst or after sneezing  No obvious day to day or daytime variability or assoc excess/ purulent sputum or mucus plugs or hemoptysis or cp or chest tightness, subjective wheeze or overt   hb symptoms.  Also denies any obvious fluctuation of symptoms with weather or environmental changes or other aggravating or alleviating factors except as outlined above   No unusual exposure hx or h/o childhood pna/ asthma or knowledge of premature birth.  Current Allergies, Complete Past Medical History, Past Surgical History, Family History, and Social History were reviewed in [name] record.  ROS  The following are not active complaints unless bolded Hoarseness, sore throat, dysphagia, dental problems, itching, sneezing,  nasal congestion or discharge of excess mucus or purulent secretions, ear ache,   fever, chills, sweats, unintended wt loss or wt gain, classically pleuritic or exertional cp,  orthopnea pnd or arm/hand swelling  or leg swelling, presyncope, palpitations, abdominal pain, anorexia, nausea, vomiting, diarrhea  or change in bowel habits or change in bladder habits, change in stools or change in urine, dysuria, hematuria,  rash, arthralgias, visual complaints, headache, numbness, weakness or  ataxia or problems with walking or coordination,  change in mood or  memory.        Current Meds   Not able to verify list is correct   Medication Sig  . acetaminophen (TYLENOL) 325 MG tablet Take 650 mg by mouth every 6 (six) hours as needed for mild pain or fever.  Marland Kitchen albuterol (PROVENTIL HFA;VENTOLIN HFA) 108 (90 Base) MCG/ACT inhaler Inhale 2 puffs into the lungs every 4 (four) hours as needed for wheezing or shortness of breath.  Marland Kitchen amiodarone (PACERONE) 200 MG tablet Take 200 mg by mouth daily.  Marland Kitchen apixaban (ELIQUIS) 5 MG TABS tablet Take 1 tablet (5 mg total) by mouth 2 (two) times daily.  . bisoprolol (ZEBETA) 5 MG tablet Take 0.5 tablets (2.5 mg total) by mouth daily.  . budesonide-formoterol (SYMBICORT) 160-4.5 MCG/ACT inhaler Inhale 2 puffs into the lungs 2 (two) times daily.  . cetirizine (ZYRTEC) 10 MG tablet Take 10 mg by mouth daily.  . furosemide (LASIX) 40 MG tablet Take 40 mg by mouth daily.  Marland Kitchen losartan (COZAAR) 100 MG tablet Take 1 tablet (100 mg total) by mouth daily.  .     . Multiple Vitamin (MULTIVITAMIN WITH MINERALS) TABS tablet Take 1 tablet by mouth daily.  Marland Kitchen omeprazole (PRILOSEC) 20 MG capsule Take 20 mg by mouth every morning.  . OXYGEN Inhale 1 L into the lungs as needed (Shortness of breath).  . potassium chloride SA (K-DUR,KLOR-CON) 20 MEQ tablet Take 1 tablet (20 mEq total) by mouth daily.  Marland Kitchen spironolactone (ALDACTONE) 25 MG tablet Take 1 tablet (25 mg total) by mouth daily.                      Objective:   Physical Exam    amb wm nad   11/26/2017   05/25/2017      196   08/31/16 201 lb (91.2 kg)  08/29/16 202 lb (91.6 kg)  08/25/16 201 lb 4 oz (91.3 kg)           - very poor dentition / upper teeth all gone and lower ncisors severely decayed   HEENT: nl   turbinates bilaterally, and oropharynx. Nl external ear canals without cough reflex - very poor dentition - lower incisors severely decayed    NECK :  without JVD/Nodes/TM/ nl carotid  upstrokes bilaterally   LUNGS: no acc muscle use,  Nl contour chest which is clear to A and P bilaterally without cough on insp or exp maneuvers   CV:  RRR  no s3 or murmur or  increase in P2, and no edema   ABD:  soft and nontender with nl inspiratory excursion in the supine position. No bruits or organomegaly appreciated, bowel sounds nl  MS:  Nl gait/ ext warm without deformities, calf tenderness, cyanosis or clubbing No obvious joint restrictions   SKIN: warm and dry without lesions    NEURO:  alert, approp, nl sensorium with  no motor or cerebellar deficits apparent.    Labs ordered/ reviewed:      Chemistry      Component Value Date/Time   NA 138 11/12/2017 1010   NA 134 09/30/2015 0935   K 4.2 11/12/2017 1010   CL 102 11/12/2017 1010   CO2 26 11/12/2017 1010   BUN 14 11/12/2017 1010   BUN 17 09/30/2015 0935   CREATININE 1.23 11/12/2017 1010      Component Value Date/Time   CALCIUM 9.7 11/12/2017 1010   ALKPHOS 69 05/16/2016 1046   AST 23 05/16/2016 1046   ALT 22 05/16/2016 1046   BILITOT 0.8 05/16/2016 1046        Lab Results  Component Value Date   WBC 11.1 (H) 11/26/2017   HGB 15.4 11/26/2017   HCT 46.0 11/26/2017   MCV 93.2 11/26/2017   PLT 230.0 11/26/2017       EOS                                                                0.4                                    11/26/2017      Lab Results  Component Value Date   TSH 1.517 05/16/2016        Lab Results  Component Value Date   ESRSEDRATE 42 (H) 11/26/2017             Assessment & Plan:

## 2017-11-27 LAB — RESPIRATORY ALLERGY PROFILE REGION II ~~LOC~~
Allergen, A. alternata, m6: <0.1 kU/L
Allergen, Cedar tree, t12: <0.1 kU/L
Allergen, Comm Silver Birch, t9: <0.1 kU/L
Allergen, Cottonwood, t14: <0.1 kU/L
Allergen, Mulberry, t76: <0.1 kU/L
Bermuda Grass: <0.1 kU/L
CLADOSPORIUM HERBARUM (M2) IGE: <0.1 kU/L
CLASS: 0
CLASS: 0
CLASS: 0
CLASS: 0
CLASS: 0
CLASS: 0
CLASS: 0
CLASS: 0
CLASS: 0
COMMON RAGWEED (SHORT) (W1) IGE: <0.1 kU/L
Cat Dander: <0.1 kU/L
Class: 0
Class: 0
Class: 0
Class: 0
Class: 0
Class: 0
Class: 0
Class: 0
Class: 0
Class: 0
Class: 0
Class: 0
Class: 0
Class: 0
Class: 0
Dog Dander: <0.1 kU/L
IGE (IMMUNOGLOBULIN E), SERUM: 158 kU/L — AB (ref <=114)
Pecan/Hickory Tree IgE: <0.1 kU/L
Rough Pigweed  IgE: <0.1 kU/L

## 2017-11-27 LAB — INTERPRETATION:

## 2017-11-28 ENCOUNTER — Encounter: Payer: Self-pay | Admitting: Internal Medicine

## 2017-11-28 NOTE — Assessment & Plan Note (Addendum)
Quit smoking 10/2015  PFT 06/15/16: FVC 4.05 L (92%) FEV1 1.33 L (49%) FEV1/FVC 0.45 and dlco corrected 45%   Alpha one 08/01/16:   Level 99/ MZ  - Spirometry 05/25/2017  FEV1 2.09 (57%)  Ratio 50 with classic curvature and no rx prior  -  05/25/2017  Walked RA x 3 laps @ 185 ft each stopped due to  End of study, nl pace, no sob or desat   - 11/26/2017  After extensive coaching inhaler device,  effectiveness =    90%  Actually the copd component is doing well on symb 160 2bid  so no need to change rx at this point  Only issue is cost of dulera vs symbicort > either one is fine here   Formulary restrictions will be an ongoing challenge for the forseable future and I would be happy to pick an alternative if the pt will first  provide me a list of them but pt  will need to return here for training for any new device that is required eg dpi vs hfa vs respimat.    In meantime we can always provide samples so the patient never runs out of any needed respiratory medications.

## 2017-11-28 NOTE — Assessment & Plan Note (Signed)
Allergy profile 11/26/2017 >  Eos 0.4/  IgE  158 RAST neg  - sinus CT pending - in meantime ? Of lingular pna raised by radiology but nothing to suggest acute infection nor amiodarone toxicity at this point so no chnge rx    I had an extended discussion with the patient reviewing all relevant studies completed to date and  lasting 15 to 20 minutes of a 25 minute visit    See device teaching which extended face to face time for this visit   Each maintenance medication was reviewed in detail including most importantly the difference between maintenance and prns and under what circumstances the prns are to be triggered using an action plan format that is not reflected in the computer generated alphabetically organized AVS.    Please see AVS for specific instructions unique to this visit that I personally wrote and verbalized to the the pt in detail and then reviewed with pt  by my nurse highlighting any  changes in therapy recommended at today's visit to their plan of care.    Marland Kitchen

## 2017-11-29 ENCOUNTER — Other Ambulatory Visit: Payer: Self-pay

## 2017-11-29 MED ORDER — AZITHROMYCIN 250 MG PO TABS: 250.0000 mg | ORAL_TABLET | Freq: Every day | ORAL | 0 refills | Status: DC

## 2017-12-03 ENCOUNTER — Telehealth: Payer: Self-pay

## 2017-12-03 ENCOUNTER — Telehealth: Payer: Self-pay | Admitting: Internal Medicine

## 2017-12-03 DIAGNOSIS — R059 Cough, unspecified: Secondary | ICD-10-CM

## 2017-12-03 DIAGNOSIS — J449 Chronic obstructive pulmonary disease, unspecified: Secondary | ICD-10-CM

## 2017-12-03 DIAGNOSIS — R05 Cough: Secondary | ICD-10-CM

## 2017-12-03 MED ORDER — DOXYCYCLINE HYCLATE 100 MG PO TABS: 100.0000 mg | ORAL_TABLET | Freq: Two times a day (BID) | ORAL | 0 refills | Status: DC

## 2017-12-03 NOTE — Telephone Encounter (Signed)
Dr. Sherene Sires please advise on antibiotic . Thank you.

## 2017-12-03 NOTE — Telephone Encounter (Signed)
Doxycycline 100 mg twice daily x 10 days  

## 2017-12-03 NOTE — Telephone Encounter (Signed)
PA for [facility] was initiated for [facility]. Paperwork was filled out and faxed back to the at [phone]. Will await response on determination. Will route to [facility] to follow up.

## 2017-12-03 NOTE — Telephone Encounter (Signed)
Called and spoke with pt regarding MW recommendations Placed order for Doxycycline 100mg  twice daily for 10 days Pt verbalized understanding and had no further concerns Nothing further needed.

## 2017-12-04 ENCOUNTER — Inpatient Hospital Stay: Admission: RE | Admit: 2017-12-04 | Payer: Medicare Other | Source: Ambulatory Visit

## 2017-12-06 NOTE — Telephone Encounter (Signed)
Received denial of Dulera  Per [employer], he needs to try and fail Breo  Please advise thanks Next ov scheduled 12/27/17

## 2017-12-07 MED ORDER — BUDESONIDE-FORMOTEROL FUMARATE 160-4.5 MCG/ACT IN AERO: 2.0000 | INHALATION_SPRAY | Freq: Two times a day (BID) | RESPIRATORY_TRACT | 12 refills | Status: DC

## 2017-12-07 NOTE — Telephone Encounter (Signed)
Spoke with patient and informed him that his [name] was denied via his insurance, Symbicort samples have been left at the front for him to pick up. Patient is also aware to bring in his formulary list at next visit. Follow up appointment confirmed with patient. Voiced understanding. Nothing further needed at this time.

## 2017-12-07 NOTE — Telephone Encounter (Signed)
Give sample of symbicort 160 and we'll regroup on return but he must bring his formulary to verify breo is the only alternative as it is not a good choice for a cougher

## 2017-12-27 ENCOUNTER — Ambulatory Visit (INDEPENDENT_AMBULATORY_CARE_PROVIDER_SITE_OTHER)
Admission: RE | Admit: 2017-12-27 | Discharge: 2017-12-27 | Disposition: A | Discharge status: Home / Self Care | Payer: Medicare Other | Source: Ambulatory Visit | Attending: Internal Medicine | Admitting: Internal Medicine

## 2017-12-27 ENCOUNTER — Encounter: Payer: Self-pay | Admitting: Internal Medicine

## 2017-12-27 ENCOUNTER — Ambulatory Visit (INDEPENDENT_AMBULATORY_CARE_PROVIDER_SITE_OTHER): Payer: Medicare Other | Admitting: Internal Medicine

## 2017-12-27 VITALS — BP 140/70 | HR 67 | Ht 72.5 in | Wt 213.0 lb

## 2017-12-27 DIAGNOSIS — J189 Pneumonia, unspecified organism: Secondary | ICD-10-CM | POA: Diagnosis not present

## 2017-12-27 DIAGNOSIS — R05 Cough: Secondary | ICD-10-CM

## 2017-12-27 DIAGNOSIS — J90 Pleural effusion, not elsewhere classified: Secondary | ICD-10-CM | POA: Diagnosis not present

## 2017-12-27 DIAGNOSIS — R059 Cough, unspecified: Secondary | ICD-10-CM

## 2017-12-27 DIAGNOSIS — J449 Chronic obstructive pulmonary disease, unspecified: Secondary | ICD-10-CM | POA: Diagnosis not present

## 2017-12-27 MED ORDER — BUDESONIDE-FORMOTEROL FUMARATE 160-4.5 MCG/ACT IN AERO: 2.0000 | INHALATION_SPRAY | Freq: Two times a day (BID) | RESPIRATORY_TRACT | 0 refills | Status: DC

## 2017-12-27 MED ORDER — BUDESONIDE-FORMOTEROL FUMARATE 160-4.5 MCG/ACT IN AERO: 2.0000 | INHALATION_SPRAY | Freq: Two times a day (BID) | RESPIRATORY_TRACT | 11 refills | Status: DC

## 2017-12-27 NOTE — Progress Notes (Signed)
Subjective:    Patient ID: Reginald Hahn, male    DOB: 1950/07/19, 67 y.o.   MRN: [id number]    Brief patient profile:  7 yowm MZ quit smoking 10/2015  With GOLD III copd     History of Present Illness  08/31/2016  f/u ov/[place] Reginald Hahn re: MZ/ COPD Gold  II flare of cough > sob x 2 weeks/ Chief Complaint  Patient presents with  . Acute Visit    Increased cough x 2 wks. He is coughing the the point his chest feels sore. He occ produces minima clear sputum. He states he occ coughs until the point the he gets dizzy and passes out. He is using albuterol inhaler 3-4 x per day.   cough had improved on trelegy then gradually worse cough esp at hs  Most dry but quite severe  Doe = MMRC2 = can't walk a nl pace on a flat grade s sob but does fine slow and flat eg shopping rec  Try prilosec otc 20mg   Take 30-60 min before first meal of the day and Pepcid ac (famotidine) 20 mg one @  Bedtime GERD diet  For cough > mucinex dm 1200 mg twice daily as needed and flutter valve when we get one we'll call to train you on it Stop trelegy and start symbicort 80 Take 2 puffs first thing in am and then another 2 puffs about 12 hours later.     05/25/2017  f/u ov/Reginald Hahn re:  Transition of care from  [name] Neighbor no longer on maint rx  GOLD II copd / MZ Chief Complaint  Patient presents with  . Follow-up    COPD-No SOB, slight cough, no wheezing  Dyspnea:  MMRC2 = can't walk a nl pace on a flat grade s sob but does fine slow and flat anywhere he wants to go   Cough: variable min am mucoid  Sleep: no longer on 02 / flat   Does not use any inhalers as maint/ proair maybe once every few weeks rec If you start needing your rescue inhaler more than twice daily>>>  then start back on dulera 100 Take up to  2 puffs first thing in am and then another 2 puffs about 12 hours later.  Please schedule a follow up visit in  6  months but call sooner if needed  with all medications /inhalers/ solutions in hand so we can verify  exactly what you are taking. This includes all medications from all doctors and over the counters    11/26/2017  f/u ov/Reginald Hahn re: GOLD II/ MZ   -  On symb 160 2bid / amiodarone started back Dec 2019  Cough worse x 2 m  Did not bring meds as req  Chief Complaint  Patient presents with  . Follow-up    SOB with activity , Productive cough and clear in color for 2 months now.  Dyspnea:  MMRC2 = can't walk a nl pace on a flat grade s sob but does fine slow and flat walks at [employer] Cough: worse x 2 months esp in am and noct  Sleeping: on back one pillow SABA use: avg once only works for 15 min if that  02: turned 02 back in  Crunching sensation both temples s pain assoc with nasal obst or after sneezing rec Please see patient coordinator before you leave today  to schedule sinus ct  Please remember to go to the lab and x-ray department downstairs in the basement  for your  tests - we will call you with the results when they are available.  Work on maintaining perfect  inhaler technique:  Please schedule a follow up office visit in 4 weeks, sooner if needed  with all medications /inhalers/ solutions in hand so we can verify exactly what you are taking. This includes all medications from all doctors and over the counters    12/27/2017  f/u ov/Reginald Hahn re:  GOLD II/ MZ ? Lingular pna/ on amiodarone  Chief Complaint  Patient presents with  . Follow-up    Breathing is overall doing well. He uses his albuterol inhaler 4 x per wk on average.    Dyspnea:  Improved MMRC1 = can walk nl pace, flat grade, can't hurry or go uphills or steps s sob   Cough: much better Sleeping: one pillow/ bed flat SABA use: as above 02: no 02    No obvious day to day or daytime variability or assoc excess/ purulent sputum or mucus plugs or hemoptysis or cp or chest tightness, subjective wheeze or overt sinus or hb symptoms.   Sleeping as above  without nocturnal  or early am exacerbation  of respiratory  c/o's or need  for noct saba. Also denies any obvious fluctuation of symptoms with weather or environmental changes or other aggravating or alleviating factors except as outlined above   No unusual exposure hx or h/o childhood pna/ asthma or knowledge of premature birth.  Current Allergies, Complete Past Medical History, Past Surgical History, Family History, and Social History were reviewed in [name] record.  ROS  The following are not active complaints unless bolded Hoarseness, sore throat, dysphagia, dental problems, itching, sneezing,  nasal congestion or discharge of excess mucus or purulent secretions, ear ache,   fever, chills, sweats, unintended wt loss or wt gain, classically pleuritic or exertional cp,  orthopnea pnd or arm/hand swelling  or leg swelling, presyncope, palpitations, abdominal pain, anorexia, nausea, vomiting, diarrhea  or change in bowel habits or change in bladder habits, change in stools or change in urine, dysuria, hematuria,  rash, arthralgias, visual complaints, headache, numbness, weakness or ataxia or problems with walking or coordination,  change in mood or  memory.        Current Meds  Medication Sig  . acetaminophen (TYLENOL) 325 MG tablet Take 650 mg by mouth every 6 (six) hours as needed for mild pain or fever.  Marland Kitchen albuterol (PROVENTIL HFA;VENTOLIN HFA) 108 (90 Base) MCG/ACT inhaler Inhale 2 puffs into the lungs every 4 (four) hours as needed for wheezing or shortness of breath.  Marland Kitchen amiodarone (PACERONE) 200 MG tablet Take 200 mg by mouth daily.  Marland Kitchen apixaban (ELIQUIS) 5 MG TABS tablet Take 1 tablet (5 mg total) by mouth 2 (two) times daily.  . bisoprolol (ZEBETA) 5 MG tablet Take 0.5 tablets (2.5 mg total) by mouth daily.  . budesonide-formoterol (SYMBICORT) 160-4.5 MCG/ACT inhaler Inhale 2 puffs into the lungs 2 (two) times daily.  . cetirizine (ZYRTEC) 10 MG tablet Take 10 mg by mouth daily.  . furosemide (LASIX) 40 MG tablet Take 40 mg by mouth  daily.  Marland Kitchen losartan (COZAAR) 100 MG tablet Take 1 tablet (100 mg total) by mouth daily.  Marland Kitchen omeprazole (PRILOSEC) 20 MG capsule Take 20 mg by mouth every morning.  . OXYGEN Inhale 1 L into the lungs as needed (Shortness of breath).  . potassium chloride SA (K-DUR,KLOR-CON) 20 MEQ tablet Take 1 tablet (20 mEq total) by mouth daily.  Marland Kitchen spironolactone (ALDACTONE)  25 MG tablet Take 1 tablet (25 mg total) by mouth daily.  .   budesonide-formoterol (SYMBICORT) 160-4.5 MCG/ACT inhaler Inhale 2 puffs into the lungs 2 (two) times daily.                        Objective:   Physical Exam    amb wm nad   12/27/2017        213   05/25/2017      196   08/31/16 201 lb (91.2 kg)  08/29/16 202 lb (91.6 kg)  08/25/16 201 lb 4 oz (91.3 kg)      Vital signs reviewed - Note on arrival 02 sats  95% on RA      HEENT: nl  oropharynx. Nl external ear canals without cough reflex -  Mild bilateral non-specific turbinate edema  / lower incisors severely decayed, upper teeth all absent    NECK :  without JVD/Nodes/TM/ nl carotid upstrokes bilaterally   LUNGS: no acc muscle use,  Mild barrel  contour chest wall with bilateral  Distant bs s audible wheeze and  without cough on insp or exp maneuver and mild  Hyperresonant  to  percussion bilaterally     CV:  RRR  no s3 or murmur or increase in P2, and no edema   ABD:  soft and nontender with pos late  insp Hoover's  in the supine position. No bruits or organomegaly appreciated, bowel sounds nl  MS:   Nl gait/  ext warm without deformities, calf tenderness, cyanosis or clubbing No obvious joint restrictions   SKIN: warm and dry without lesions    NEURO:  alert, approp, nl sensorium with  no motor or cerebellar deficits apparent.          CXR PA and Lateral:   12/27/2017 :    I personally reviewed images and agree with radiology impression as follows:   Stable left basilar scarring or subsegmental atelectasis.          Assessment & Plan:

## 2017-12-27 NOTE — Assessment & Plan Note (Addendum)
Quit smoking 10/2015  PFT 06/15/16: FVC 4.05 L (92%) FEV1 1.33 L (49%) FEV1/FVC 0.45 and dlco corrected 45%   Alpha one 08/01/16:   Level 99/ MZ  - Spirometry 05/25/2017  FEV1 2.09 (57%)  Ratio 50 with classic curvature and no rx prior  -  05/25/2017  Walked RA x 3 laps @ 185 ft each stopped due to  End of study, nl pace, no sob or desat   12/27/2017  After extensive coaching inhaler device,  effectiveness =    90%   Improved on just symbicort 160 2 bid but issues with coverage  Formulary restrictions will be an ongoing challenge for the forseable future and I would be happy to pick an alternative if the pt will first  provide me a list of them but pt  will need to return here for training for any new device that is required eg dpi vs hfa vs respimat.    In meantime we can always provide samples so the patient never runs out of any needed respiratory medications.    I had an extended discussion with the patient reviewing all relevant studies completed to date and  lasting 15 to 20 minutes of a 25 minute visit    See device teaching which extended face to face time for this visit.  Each maintenance medication was reviewed in detail including emphasizing most importantly the difference between maintenance and prns and under what circumstances the prns are to be triggered using an action plan format that is not reflected in the computer generated alphabetically organized AVS which I have not found useful in most complex patients, especially with respiratory illnesses  Please see AVS for specific instructions unique to this visit that I personally wrote and verbalized to the the pt in detail and then reviewed with pt  by my nurse highlighting any  changes in therapy recommended at today's visit to their plan of care.

## 2017-12-27 NOTE — Patient Instructions (Addendum)
No change in medications - call right away if problems filling your symbicort   Please remember to go to the  x-ray department downstairs in the basement  for your tests - we will call you with the results when they are available.      Please schedule a follow up visit in 3 months but call sooner if needed

## 2017-12-27 NOTE — Assessment & Plan Note (Addendum)
Allergy profile 11/26/2017 >  Eos 0.4/ IgE  158 RAST neg  - sinus CT 11/26/2017 >>> declined    Assoc with ? New lingular infiltrate and active rhinitis? sinusits symptomatically resolved p course of doxy > resolved 12/27/2017

## 2017-12-28 ENCOUNTER — Telehealth: Payer: Self-pay | Admitting: Internal Medicine

## 2017-12-28 NOTE — Telephone Encounter (Signed)
Notes recorded by Maurene Capes, CMA on 12/28/2017 at 9:35 AM EDT Left message for patient to call back for results. ------  Notes recorded by Nyoka Cowden, MD on 12/27/2017 at 5:05 PM EDT Call pt: Reviewed cxr and no acute change so no change in recommendations made at ov  Pt aware of results.

## 2017-12-29 ENCOUNTER — Encounter: Payer: Self-pay | Admitting: Internal Medicine

## 2018-01-01 NOTE — Progress Notes (Signed)
Spoke with pt and notified of results per Dr. Wert. Pt verbalized understanding and denied any questions. 

## 2018-03-01 ENCOUNTER — Telehealth: Payer: Self-pay | Admitting: Internal Medicine

## 2018-03-01 MED ORDER — ALBUTEROL SULFATE HFA 108 (90 BASE) MCG/ACT IN AERS: 2.0000 | INHALATION_SPRAY | RESPIRATORY_TRACT | 5 refills | Status: DC | PRN

## 2018-03-01 NOTE — Telephone Encounter (Signed)
Per MW verbally-okay to refill. Rx for Ventolin has been sent to preferred pharmacy. Nothing further is needed.

## 2018-03-01 NOTE — Telephone Encounter (Addendum)
Spoke to pt, who is requesting Rx for Ventolin.  It appears that this medication was last prescribed by Dr. Jamison Neighbor.  Pt made aware of our current address.   MW please advise if okay to refill? Thanks  12/27/17 AVS Instructions   No change in medications - call right away if problems filling your symbicort   Please remember to go to the  x-ray department downstairs in the basement  for your tests - we will call you with the results when they are available.      Please schedule a follow up visit in 3 months but call sooner if needed

## 2018-03-01 NOTE — Telephone Encounter (Signed)
lmtcb x1 for pt. 

## 2018-03-13 ENCOUNTER — Other Ambulatory Visit (HOSPITAL_COMMUNITY): Payer: Self-pay | Admitting: *Deleted

## 2018-03-13 MED ORDER — APIXABAN 5 MG PO TABS: 5.0000 mg | ORAL_TABLET | Freq: Two times a day (BID) | ORAL | 11 refills | Status: DC

## 2018-03-13 MED ORDER — FUROSEMIDE 40 MG PO TABS: 40.0000 mg | ORAL_TABLET | Freq: Every day | ORAL | 2 refills | Status: DC

## 2018-03-13 MED ORDER — BISOPROLOL FUMARATE 5 MG PO TABS: 2.5000 mg | ORAL_TABLET | Freq: Every day | ORAL | 6 refills | Status: DC

## 2018-03-13 MED ORDER — SPIRONOLACTONE 25 MG PO TABS: 25.0000 mg | ORAL_TABLET | Freq: Every day | ORAL | 11 refills | Status: DC

## 2018-03-13 MED ORDER — POTASSIUM CHLORIDE CRYS ER 20 MEQ PO TBCR: 20.0000 meq | EXTENDED_RELEASE_TABLET | Freq: Every day | ORAL | 11 refills | Status: DC

## 2018-03-13 MED ORDER — LOSARTAN POTASSIUM 100 MG PO TABS: 100.0000 mg | ORAL_TABLET | Freq: Every day | ORAL | 6 refills | Status: DC

## 2018-03-14 ENCOUNTER — Encounter (HOSPITAL_COMMUNITY): Payer: Medicare Other | Admitting: Internal Medicine

## 2018-03-28 ENCOUNTER — Ambulatory Visit: Payer: Medicare Other | Admitting: Internal Medicine

## 2018-05-04 ENCOUNTER — Other Ambulatory Visit (HOSPITAL_COMMUNITY): Payer: Self-pay | Admitting: Physician Assistant

## 2018-05-07 ENCOUNTER — Encounter (HOSPITAL_COMMUNITY): Payer: Medicare Other | Admitting: Internal Medicine

## 2018-06-17 ENCOUNTER — Other Ambulatory Visit (HOSPITAL_COMMUNITY): Payer: Self-pay | Admitting: Physician Assistant

## 2018-06-18 ENCOUNTER — Other Ambulatory Visit (HOSPITAL_COMMUNITY): Payer: Self-pay | Admitting: Internal Medicine

## 2018-06-18 ENCOUNTER — Other Ambulatory Visit (HOSPITAL_COMMUNITY): Payer: Self-pay

## 2018-06-18 MED ORDER — AMIODARONE HCL 200 MG PO TABS: 200.0000 mg | ORAL_TABLET | Freq: Two times a day (BID) | ORAL | 0 refills | Status: DC

## 2018-06-19 ENCOUNTER — Other Ambulatory Visit (HOSPITAL_COMMUNITY): Payer: Self-pay

## 2018-06-19 MED ORDER — AMIODARONE HCL 200 MG PO TABS: 200.0000 mg | ORAL_TABLET | Freq: Every day | ORAL | 3 refills | Status: DC

## 2018-06-20 ENCOUNTER — Other Ambulatory Visit (HOSPITAL_COMMUNITY): Payer: Self-pay

## 2018-07-03 ENCOUNTER — Other Ambulatory Visit (HOSPITAL_COMMUNITY): Payer: Self-pay

## 2018-07-03 ENCOUNTER — Telehealth (HOSPITAL_COMMUNITY): Payer: Self-pay | Admitting: Vascular Surgery

## 2018-07-03 MED ORDER — SPIRONOLACTONE 25 MG PO TABS: 25.0000 mg | ORAL_TABLET | Freq: Every day | ORAL | 3 refills | Status: DC

## 2018-07-03 MED ORDER — POTASSIUM CHLORIDE CRYS ER 20 MEQ PO TBCR: 20.0000 meq | EXTENDED_RELEASE_TABLET | Freq: Every day | ORAL | 3 refills | Status: DC

## 2018-07-03 MED ORDER — APIXABAN 5 MG PO TABS: 5.0000 mg | ORAL_TABLET | Freq: Two times a day (BID) | ORAL | 3 refills | Status: DC

## 2018-07-03 MED ORDER — LOSARTAN POTASSIUM 100 MG PO TABS: 100.0000 mg | ORAL_TABLET | Freq: Every day | ORAL | 3 refills | Status: DC

## 2018-07-03 MED ORDER — BISOPROLOL FUMARATE 5 MG PO TABS: 2.5000 mg | ORAL_TABLET | Freq: Every day | ORAL | 3 refills | Status: DC

## 2018-07-03 MED ORDER — AMIODARONE HCL 200 MG PO TABS: 200.0000 mg | ORAL_TABLET | Freq: Every day | ORAL | 3 refills | Status: DC

## 2018-07-03 MED ORDER — OMEPRAZOLE 20 MG PO CPDR: 20.0000 mg | DELAYED_RELEASE_CAPSULE | ORAL | 3 refills | Status: DC

## 2018-07-03 NOTE — Telephone Encounter (Signed)
Called pt to reschedule 4/2 appt w/ db, pt is rescheduled 6/4, pt would like all his prescriptions to be refilled for 12 months (I guess 90 day refills for 12 months) , he states he stopped Eliquis please advise

## 2018-07-11 ENCOUNTER — Encounter (HOSPITAL_COMMUNITY): Payer: Medicare Other | Admitting: Internal Medicine

## 2018-07-22 ENCOUNTER — Other Ambulatory Visit (HOSPITAL_COMMUNITY): Payer: Self-pay | Admitting: Internal Medicine

## 2018-09-06 ENCOUNTER — Telehealth (HOSPITAL_COMMUNITY): Payer: Self-pay | Admitting: Vascular Surgery

## 2018-09-06 NOTE — Telephone Encounter (Signed)
Left pt VM to inform that his 6/4 appt will be canceled due to Covid, pt will e put on wait list

## 2018-09-12 ENCOUNTER — Encounter (HOSPITAL_COMMUNITY): Payer: Medicare Other | Admitting: Internal Medicine

## 2018-10-17 ENCOUNTER — Other Ambulatory Visit (HOSPITAL_COMMUNITY): Payer: Self-pay | Admitting: Internal Medicine

## 2018-11-10 IMAGING — DX DG CHEST 2V
2 series · 2 of 2 positions shown · non-contrast
Comparison: Radiographs June 12, 2016.

CLINICAL DATA: Cough, dyspnea.

EXAM:
CHEST  2 VIEW

[chest pa]
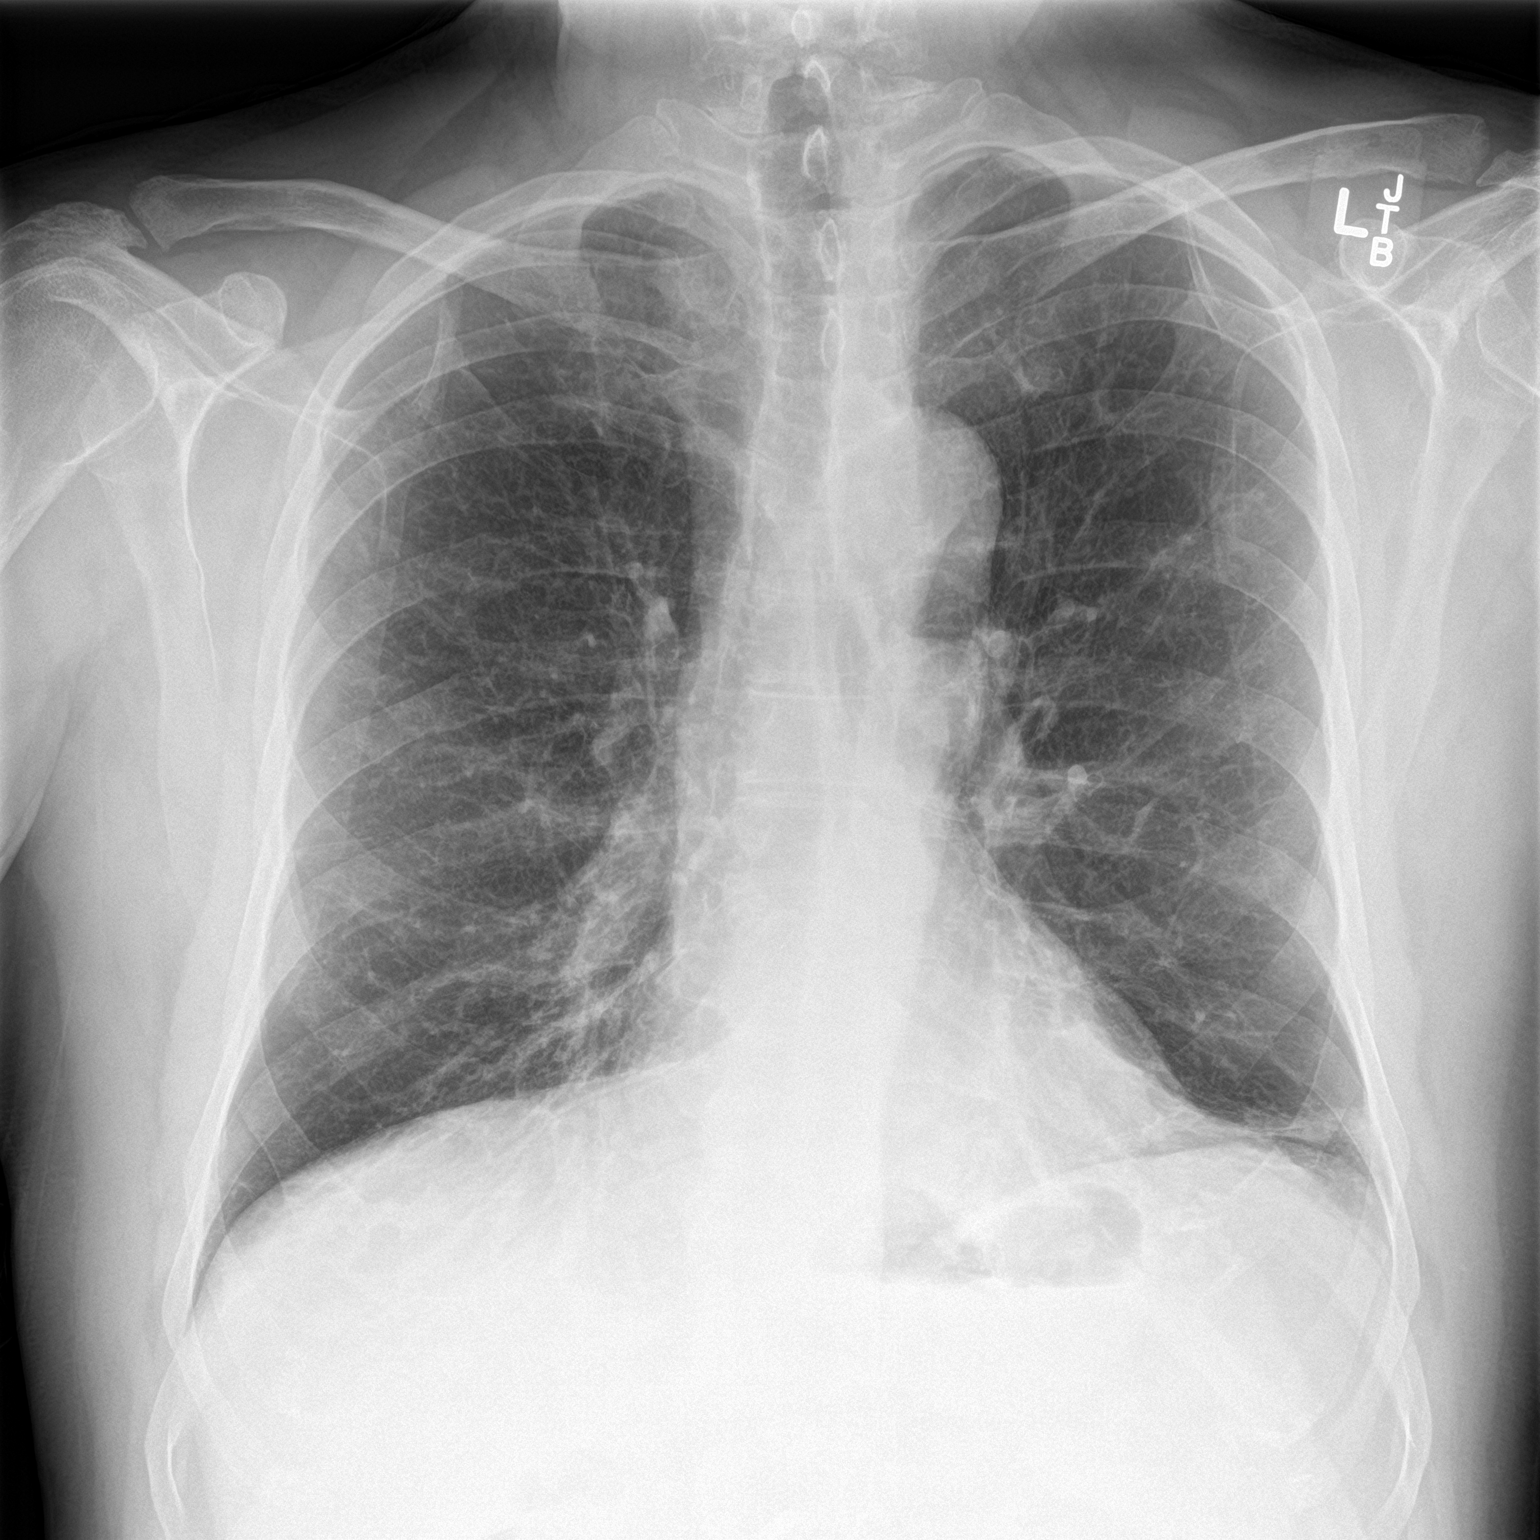

[chest lat]
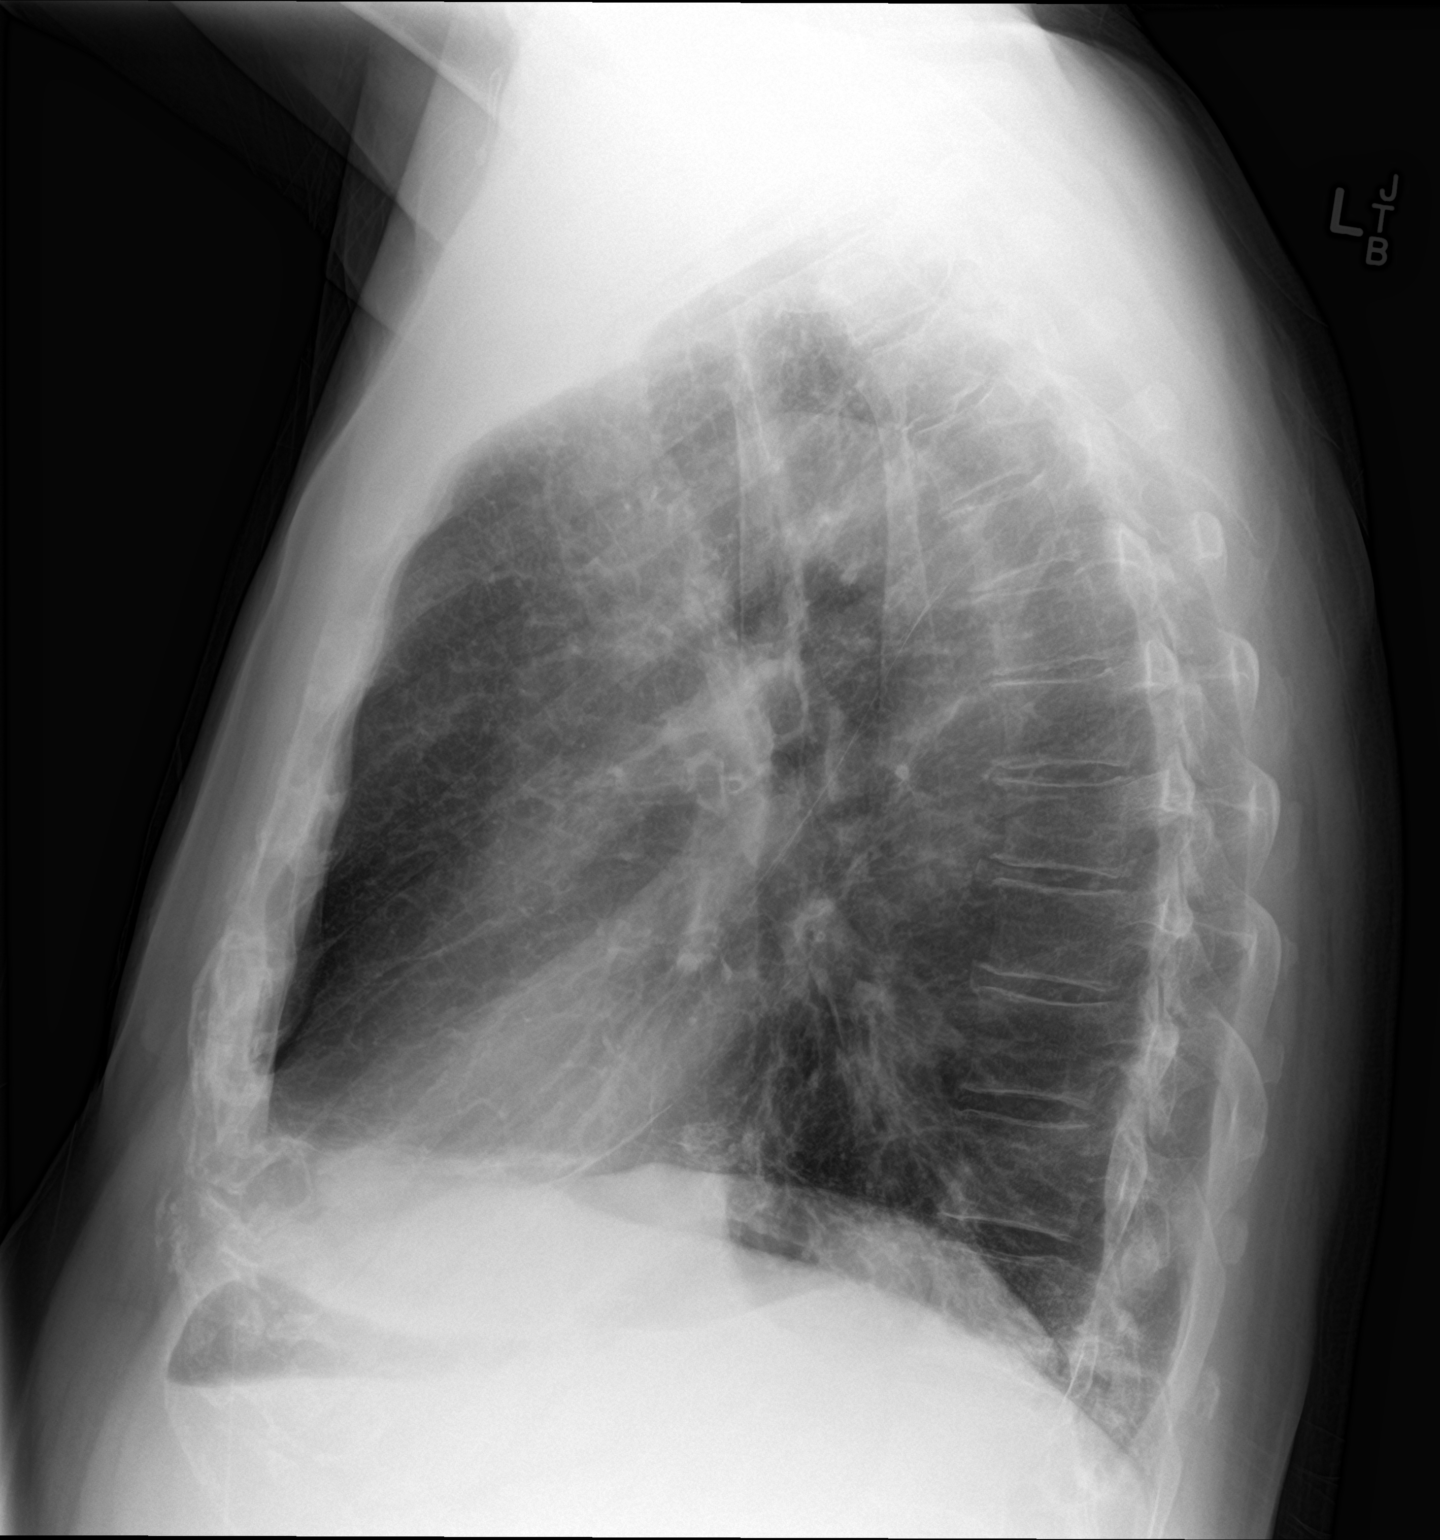

[2 of 2 positions shown; findings below may reference images not displayed]

FINDINGS: Stable cardiomediastinal silhouette. No pneumothorax or significant
pleural effusion is noted. Right lung is clear. Mild left basilar
atelectasis or scarring is noted. Bony thorax is unremarkable.
IMPRESSION: Stable mild left basilar subsegmental atelectasis or scarring.

## 2018-12-20 ENCOUNTER — Telehealth: Payer: Self-pay | Admitting: Internal Medicine

## 2018-12-20 NOTE — Telephone Encounter (Signed)
Pt returned call and I let him know that a 1 mo supply of med was sent to pharm and that he needed to be seen in order to get more and he sched appt for next wk.Reginald Hahn

## 2018-12-20 NOTE — Telephone Encounter (Signed)
LMTCB. Last OV 12/2017. 1 month supply can be sent until patient is seen in office.

## 2018-12-20 NOTE — Telephone Encounter (Signed)
Patient scheduled Ov 12/26/18, with Dr. Melvyn Novas.  Nothing further at this time.

## 2018-12-26 ENCOUNTER — Encounter: Payer: Self-pay | Admitting: Internal Medicine

## 2018-12-26 ENCOUNTER — Ambulatory Visit (INDEPENDENT_AMBULATORY_CARE_PROVIDER_SITE_OTHER): Payer: Medicare Other

## 2018-12-26 ENCOUNTER — Other Ambulatory Visit: Payer: Self-pay

## 2018-12-26 ENCOUNTER — Ambulatory Visit (INDEPENDENT_AMBULATORY_CARE_PROVIDER_SITE_OTHER): Payer: Medicare Other | Admitting: Internal Medicine

## 2018-12-26 DIAGNOSIS — R0609 Other forms of dyspnea: Secondary | ICD-10-CM

## 2018-12-26 DIAGNOSIS — J449 Chronic obstructive pulmonary disease, unspecified: Secondary | ICD-10-CM

## 2018-12-26 MED ORDER — BUDESONIDE-FORMOTEROL FUMARATE 160-4.5 MCG/ACT IN AERO: 2.0000 | INHALATION_SPRAY | Freq: Two times a day (BID) | RESPIRATORY_TRACT | 0 refills | Status: DC

## 2018-12-26 MED ORDER — BUDESONIDE-FORMOTEROL FUMARATE 160-4.5 MCG/ACT IN AERO: 2.0000 | INHALATION_SPRAY | Freq: Two times a day (BID) | RESPIRATORY_TRACT | 11 refills | Status: DC

## 2018-12-26 MED ORDER — BENZONATATE 200 MG PO CAPS: 200.0000 mg | ORAL_CAPSULE | Freq: Three times a day (TID) | ORAL | 1 refills | Status: DC | PRN

## 2018-12-26 MED ORDER — ALBUTEROL SULFATE HFA 108 (90 BASE) MCG/ACT IN AERS: 2.0000 | INHALATION_SPRAY | RESPIRATORY_TRACT | 11 refills | Status: DC | PRN

## 2018-12-26 NOTE — Progress Notes (Signed)
Subjective:    Patient ID: Reginald Hahn, male    DOB: March 29, 1951, 68 y.o.   MRN: [id number]    Brief patient profile:  6 yowm MZ quit smoking 10/2015  With GOLD III copd     History of Present Illness  08/31/2016  f/u ov/[place] re: MZ/ COPD Gold  II flare of cough > sob x 2 weeks/ Chief Complaint  Patient presents with  . Acute Visit    Increased cough x 2 wks. He is coughing the the point his chest feels sore. He occ produces minima clear sputum. He states he occ coughs until the point the he gets dizzy and passes out. He is using albuterol inhaler 3-4 x per day.   cough had improved on trelegy then gradually worse cough esp at hs  Most dry but quite severe  Doe = MMRC2 = can't walk a nl pace on a flat grade s sob but does fine slow and flat eg shopping rec  Try prilosec otc 20mg   Take 30-60 min before first meal of the day and Pepcid ac (famotidine) 20 mg one @  Bedtime GERD diet  For cough > mucinex dm 1200 mg twice daily as needed and flutter valve when we get one we'll call to train you on it Stop trelegy and start symbicort 80 Take 2 puffs first thing in am and then another 2 puffs about 12 hours later.       12/26/2018  f/u ov/Zorina Mallin re:  GOLD II COPD / MZ maint on symb 160 2bid  But also still on amiodarone 200 mg daily  Chief Complaint  Patient presents with  . Follow-up    Needing refill on rescue inhaler. He gets winded walking flat surface for about 10 min.   Dyspnea:  MMRC2 = can't walk a nl pace on a flat grade s sob but does fine slow and flat  Faster pace stops p 10 min Cough: some am cough white does not wake him up or disturb sleep. Sleeping: lie flat sleep on L side / 1 pillow SABA use: ventolin hs but not using prn approp 02: none    No obvious day to day or daytime variability or assoc excess/ purulent sputum or mucus plugs or hemoptysis or cp or chest tightness, subjective wheeze or overt sinus or hb symptoms.   Sleeping ok as above  without nocturnal  or  early am exacerbation  of respiratory  c/o's or need for noct saba. Also denies any obvious fluctuation of symptoms with weather or environmental changes or other aggravating or alleviating factors except as outlined above   No unusual exposure hx or h/o childhood pna/ asthma or knowledge of premature birth.  Current Allergies, Complete Past Medical History, Past Surgical History, Family History, and Social History were reviewed in [employer] record.  ROS  The following are not active complaints unless bolded Hoarseness, sore throat, dysphagia, dental problems, itching, sneezing,  nasal congestion or discharge of excess mucus or purulent secretions, ear ache,   fever, chills, sweats, unintended wt loss or wt gain, classically pleuritic or exertional cp,  orthopnea pnd or arm/hand swelling  or leg swelling, presyncope, palpitations, abdominal pain, anorexia, nausea, vomiting, diarrhea  or change in bowel habits or change in bladder habits, change in stools or change in urine, dysuria, hematuria,  rash, arthralgias, visual complaints, headache, numbness, weakness or ataxia or problems with walking or coordination,  change in mood or  memory.  Current Meds  Medication Sig  . acetaminophen (TYLENOL) 325 MG tablet Take 650 mg by mouth every 6 (six) hours as needed for mild pain or fever.  Marland Kitchen albuterol (PROVENTIL HFA;VENTOLIN HFA) 108 (90 Base) MCG/ACT inhaler Inhale 2 puffs into the lungs every 4 (four) hours as needed for wheezing or shortness of breath.  Marland Kitchen amiodarone (PACERONE) 200 MG tablet Take 1 tablet (200 mg total) by mouth daily.  . bisoprolol (ZEBETA) 5 MG tablet Take 0.5 tablets (2.5 mg total) by mouth daily.  . budesonide-formoterol (SYMBICORT) 160-4.5 MCG/ACT inhaler Inhale 2 puffs into the lungs 2 (two) times daily.  . cetirizine (ZYRTEC) 10 MG tablet Take 10 mg by mouth daily.  . furosemide (LASIX) 40 MG tablet Take 1 tablet by mouth once daily  . losartan  (COZAAR) 100 MG tablet Take 1 tablet (100 mg total) by mouth daily.  Marland Kitchen omeprazole (PRILOSEC) 20 MG capsule Take 1 capsule (20 mg total) by mouth every morning.  . potassium chloride SA (K-DUR,KLOR-CON) 20 MEQ tablet Take 1 tablet (20 mEq total) by mouth daily.  Marland Kitchen spironolactone (ALDACTONE) 25 MG tablet Take 1 tablet (25 mg total) by mouth daily.                      Objective:   Physical Exam   amb nad    12/26/2018        207  12/27/2017        213   05/25/2017      196   08/31/16 201 lb (91.2 kg)  08/29/16 202 lb (91.6 kg)  08/25/16 201 lb 4 oz (91.3 kg)      Vital signs reviewed - Note on arrival 02 sats  97% on RA     HEENT : pt wearing mask not removed for exam due to covid - 19 concerns.     NECK :  without JVD/Nodes/TM/ nl carotid upstrokes bilaterally   LUNGS: no acc muscle use,  Mild barrel  contour chest wall with bilateral  Distant bs s audible wheeze and  without cough on insp or exp maneuvers  and mild  Hyperresonant  to  percussion bilaterally     CV:  RRR  no s3 or murmur or increase in P2, and no edema   ABD:  soft and nontender with pos end  insp Hoover's  in the supine position. No bruits or organomegaly appreciated, bowel sounds nl  MS:   Nl gait/  ext warm without deformities, calf tenderness, cyanosis or clubbing No obvious joint restrictions   SKIN: warm and dry without lesions    NEURO:  alert, approp, nl sensorium with  no motor or cerebellar deficits apparent.            CXR PA and Lateral:   12/26/2018 :    I personally reviewed images and agree with radiology impression as follows:   1. Pulmonary hyperinflation and mild emphysematous change. No acute abnormality of the lungs. 2.  No evidence of fibrotic pulmonary amiodarone toxicity.            Assessment & Plan:

## 2018-12-26 NOTE — Patient Instructions (Addendum)
Plan A = Automatic = Always=    Symbicort 160 Take 2 puffs first thing in am and then another 2 puffs about 12 hours later.    Plan B = Backup (to supplement plan A, not to replace it) Only use your albuterol inhaler as a rescue medication to be used if you can't catch your breath by resting or doing a relaxed purse lip breathing pattern.  - The less you use it, the better it will work when you need it. - Ok to use the inhaler up to 2 puffs  every 4 hours if you must but call for appointment if use goes up over your usual need - Don't leave home without it !!  (think of it like the spare tire for your car)   For cough>  Tessalon 200 mg one every 6 hours as needed   Make sure you check your oxygen saturations at highest level of activity to be sure it stays over 90% and adjust upward to maintain this level if needed but remember to turn it back to previous settings when you stop (to conserve your supply).   Please remember to go to the  x-ray department  for your tests - we will call you with the results when they are available     Please schedule a follow up visit in 3 months but call sooner if needed with pfts

## 2018-12-27 ENCOUNTER — Encounter: Payer: Self-pay | Admitting: Internal Medicine

## 2018-12-27 DIAGNOSIS — R0609 Other forms of dyspnea: Secondary | ICD-10-CM | POA: Insufficient documentation

## 2018-12-27 NOTE — Assessment & Plan Note (Signed)
Amiodarone rx since at least 09/28/15   Patients typically have been on amiodarone for 6-12 months before this complication manifests.  Of note, serial clinical evaluation for symptoms such as cough dyspnea or fevers is  the preferred method of monitoring for pulmonary toxicity because a decrease in DLCO or lung volumes is a nonspecific for toxicity. Pathologically amiodarone pulmonary toxicity may appear as interstitial pneumonitis, eosinophilic pneumonia, organizing pneumonia, pulmonary fibrosis or less commonly as diffuse alveolar hemorrhage, pulmonary nodules or pleural effusions.  Risk factors for pulmonary toxicity include age greater than 57, daily dose greater than equal to 400 mg, a high cumulative dose, or pre-existing lung disease   So he has possbile 3/4 risk factors  rec : Make sure you check your oxygen saturations at highest level of activity to be sure it stays over 90% and  Let me know if trending down at the same level of activity    I had an extended discussion with the patient reviewing all relevant studies completed to date and  lasting 15 to 20 minutes of a 25 minute visit    I performed detailed device teaching using a teach back method which extended face to face time for this visit (see above)  Each maintenance medication was reviewed in detail including emphasizing most importantly the difference between maintenance and prns and under what circumstances the prns are to be triggered using an action plan format that is not reflected in the computer generated alphabetically organized AVS which I have not found useful in most complex patients, especially with respiratory illnesses  Please see AVS for specific instructions unique to this visit that I personally wrote and verbalized to the the pt in detail and then reviewed with pt  by my nurse highlighting any  changes in therapy recommended at today's visit to their plan of care.

## 2018-12-27 NOTE — Assessment & Plan Note (Signed)
Quit smoking 10/2015  PFT 06/15/16: FVC 4.05 L (92%) FEV1 1.33 L (49%) FEV1/FVC 0.45 and dlco corrected 45%   Alpha one 08/01/16:   Level 99/ MZ  - Spirometry 05/25/2017  FEV1 2.09 (57%)  Ratio 50 with classic curvature and no rx prior  -  05/25/2017  Walked RA x 3 laps @ 185 ft each stopped due to  End of study, nl pace, no sob or desat  - 12/26/2018  After extensive coaching inhaler device,  effectiveness =    90%   Concerned losing ground related to low alpha one AT levels so needs to return for pfts in 3 m and consider offering replacement rx   In meantim needs to understand better how/ when to use saba   Advised I spent extra time with pt today reviewing appropriate use of albuterol for prn use on exertion with the following points: 1) saba is for relief of sob that does not improve by walking a slower pace or resting but rather if the pt does not improve after trying this first. 2) If the pt is convinced, as many are, that saba helps recover from activity faster then it's easy to tell if this is the case by re-challenging : ie stop, take the inhaler, then p 5 minutes try the exact same activity (intensity of workload) that just caused the symptoms and see if they are substantially diminished or not after saba 3) if there is an activity that reproducibly causes the symptoms, try the saba 15 min before the activity on alternate days   If in fact the saba really does help, then fine to continue to use it prn but advised may need to look closer at the maintenance regimen being used to achieve better control of airways disease with exertion.  (eg add LABA or new triple rx w/a )

## 2019-01-09 ENCOUNTER — Other Ambulatory Visit (HOSPITAL_COMMUNITY): Payer: Self-pay | Admitting: Internal Medicine

## 2019-03-27 ENCOUNTER — Ambulatory Visit: Payer: Medicare Other | Admitting: Internal Medicine

## 2019-03-31 ENCOUNTER — Other Ambulatory Visit (HOSPITAL_COMMUNITY): Payer: Self-pay | Admitting: Internal Medicine

## 2019-06-11 IMAGING — DX DG CHEST 2V
2 series · 2 of 2 positions shown · non-contrast
Comparison: Chest x-ray 08/25/2016.

CLINICAL DATA: 66-year-old male with flu-like symptoms. Chills,
body aches, weakness and mild cough. History of COPD. Chest pain.

EXAM:
CHEST  2 VIEW

[chest pa]
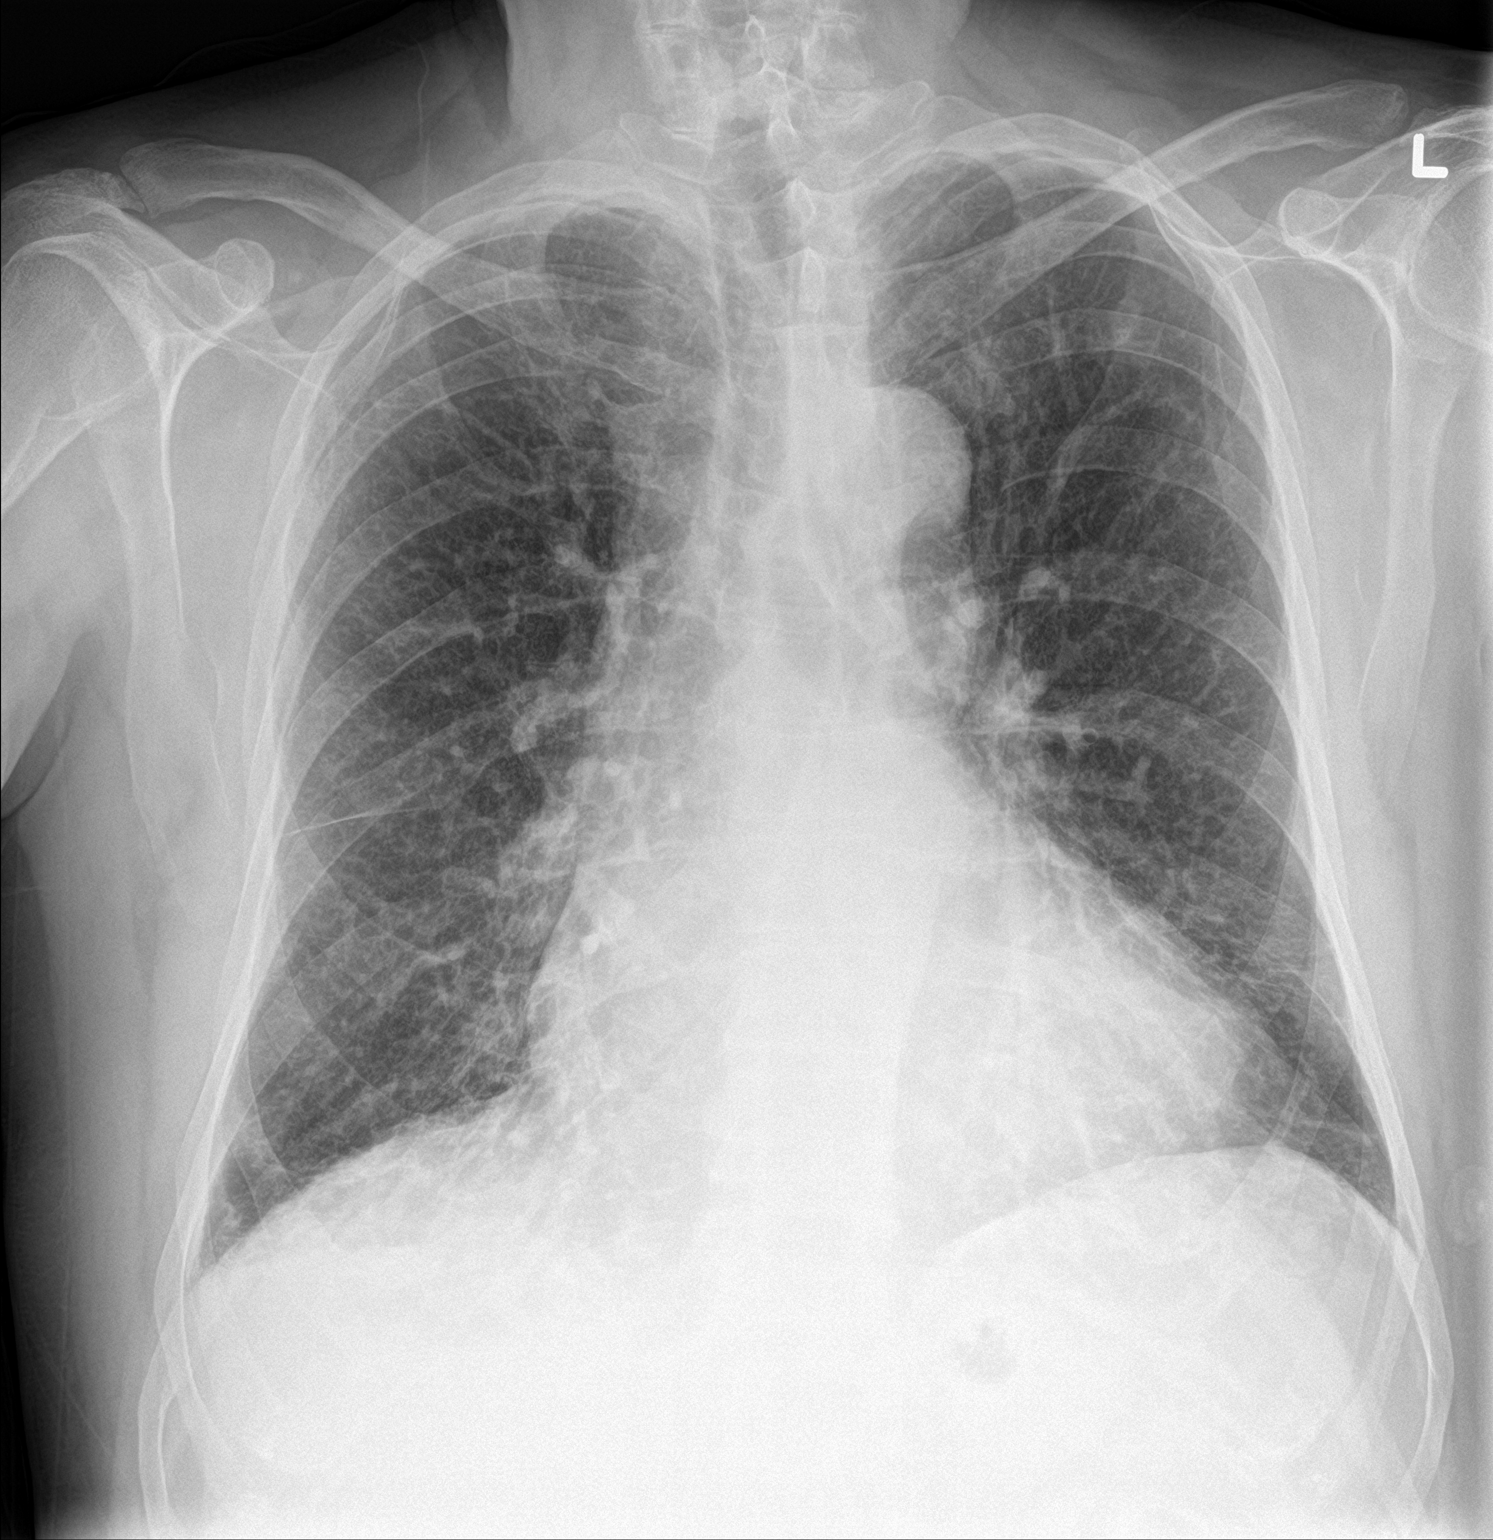

[chest lat]
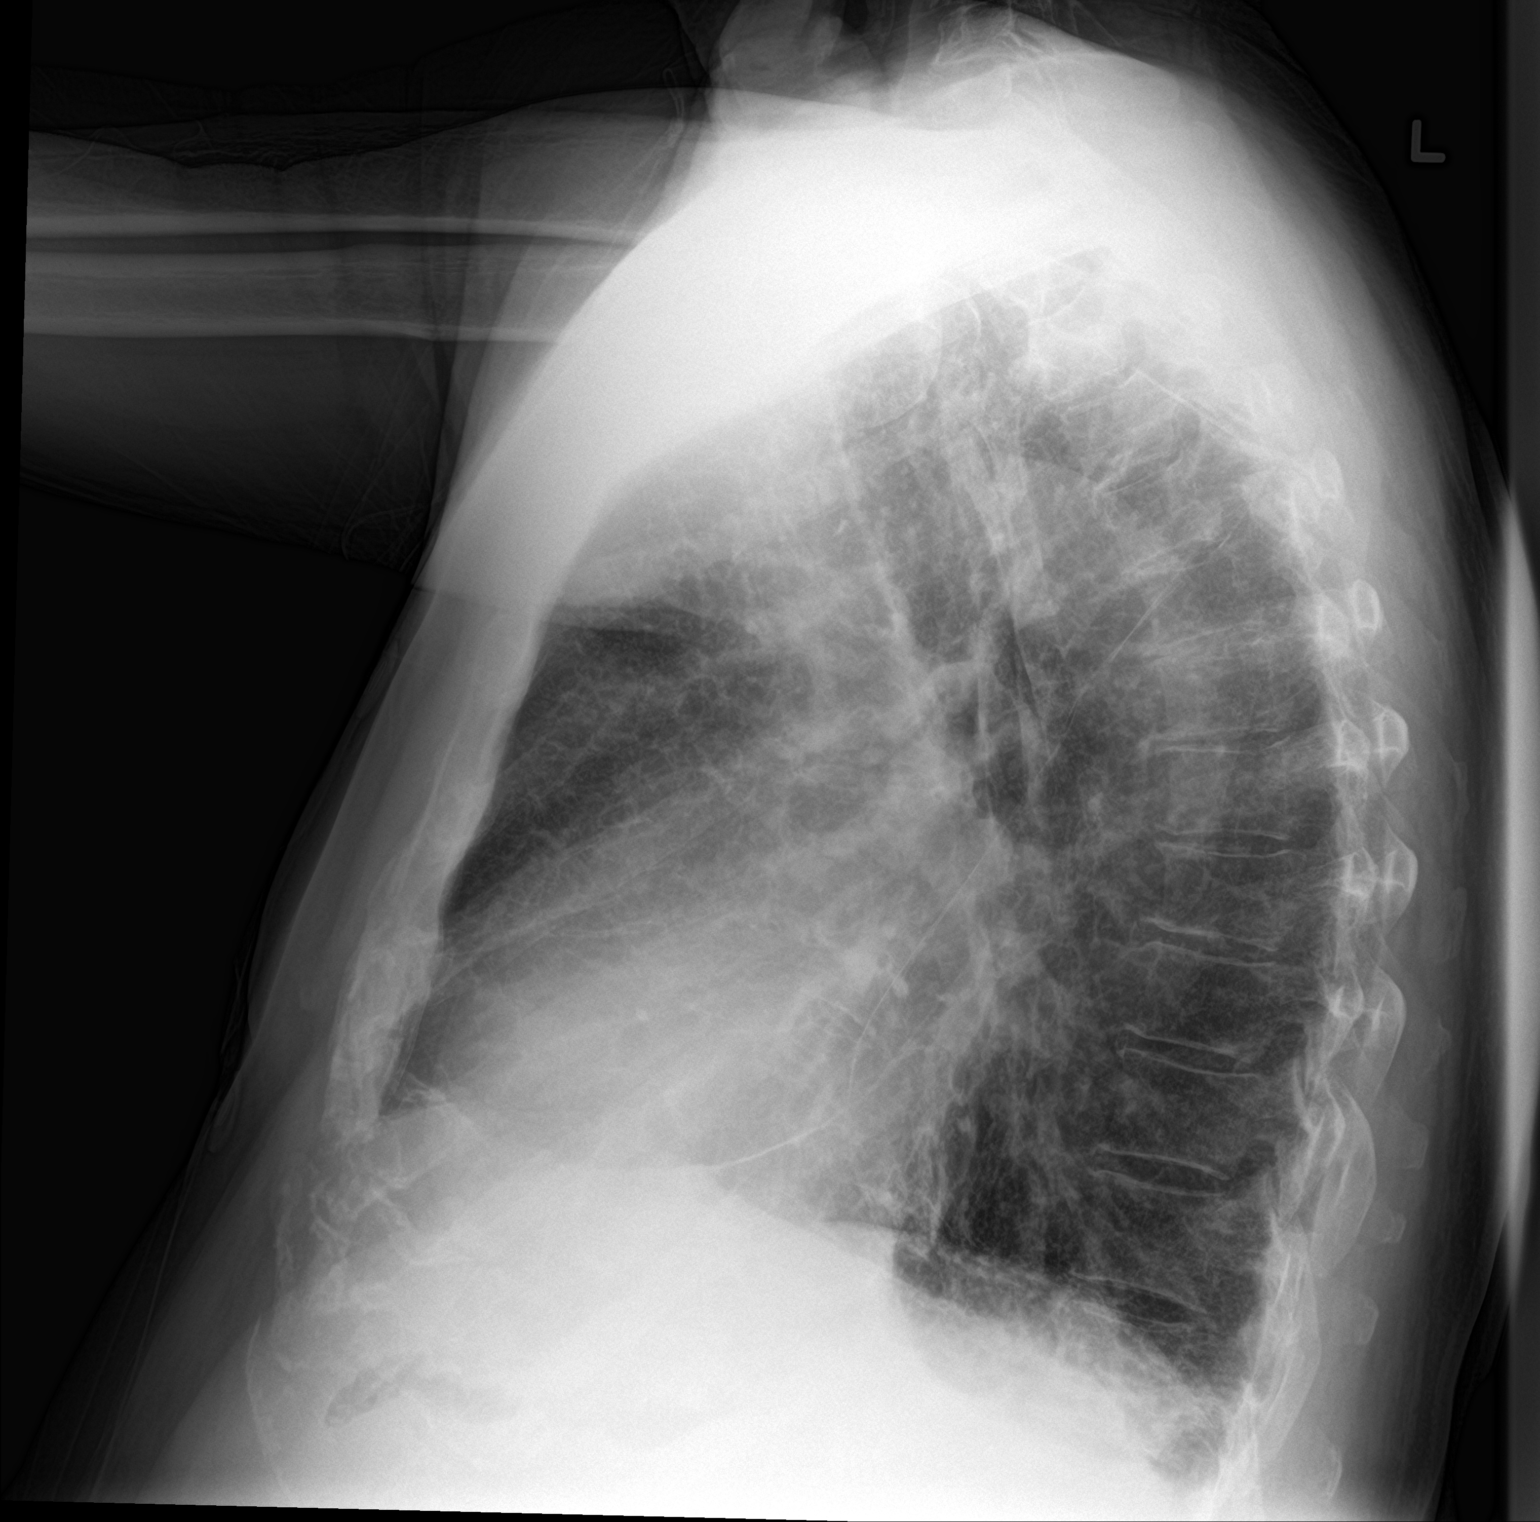

[2 of 2 positions shown; findings below may reference images not displayed]

FINDINGS: Diffuse peribronchial cuffing. No acute consolidative airspace
disease. No pleural effusions. No definite suspicious appearing
pulmonary nodules or masses. No evidence of pulmonary edema. Heart
size is borderline enlarged. The patient is rotated to the right on
today's exam, resulting in distortion of the mediastinal contours
and reduced diagnostic sensitivity and specificity for mediastinal
pathology. Atherosclerotic calcifications in the thoracic aorta.
IMPRESSION: 1. Diffuse peribronchial cuffing, concerning for an acute
bronchitis.
2. Aortic atherosclerosis.

## 2019-06-29 ENCOUNTER — Other Ambulatory Visit (HOSPITAL_COMMUNITY): Payer: Self-pay | Admitting: Internal Medicine

## 2019-07-13 ENCOUNTER — Other Ambulatory Visit (HOSPITAL_COMMUNITY): Payer: Self-pay | Admitting: Internal Medicine

## 2019-08-05 ENCOUNTER — Other Ambulatory Visit (HOSPITAL_COMMUNITY): Payer: Self-pay

## 2019-08-05 ENCOUNTER — Encounter (HOSPITAL_COMMUNITY): Payer: Self-pay | Admitting: Cardiology

## 2019-08-05 ENCOUNTER — Other Ambulatory Visit (HOSPITAL_COMMUNITY): Payer: Self-pay | Admitting: Internal Medicine

## 2019-08-05 MED ORDER — OMEPRAZOLE 20 MG PO CPDR: 20.0000 mg | DELAYED_RELEASE_CAPSULE | Freq: Every day | ORAL | 0 refills | Status: DC

## 2019-08-05 MED ORDER — AMIODARONE HCL 200 MG PO TABS: 200.0000 mg | ORAL_TABLET | Freq: Every day | ORAL | 0 refills | Status: DC

## 2019-08-05 NOTE — Addendum Note (Signed)
Addended by: Theresia Bough on: 08/05/2019 12:48 PM   Modules accepted: Orders

## 2019-08-07 ENCOUNTER — Other Ambulatory Visit (HOSPITAL_COMMUNITY): Payer: Self-pay | Admitting: Internal Medicine

## 2019-08-10 IMAGING — DX DG CHEST 2V
2 series · 2 of 2 positions shown · non-contrast
Comparison: 03/29/2017

CLINICAL DATA: COPD

EXAM:
CHEST  2 VIEW

[chest pa]
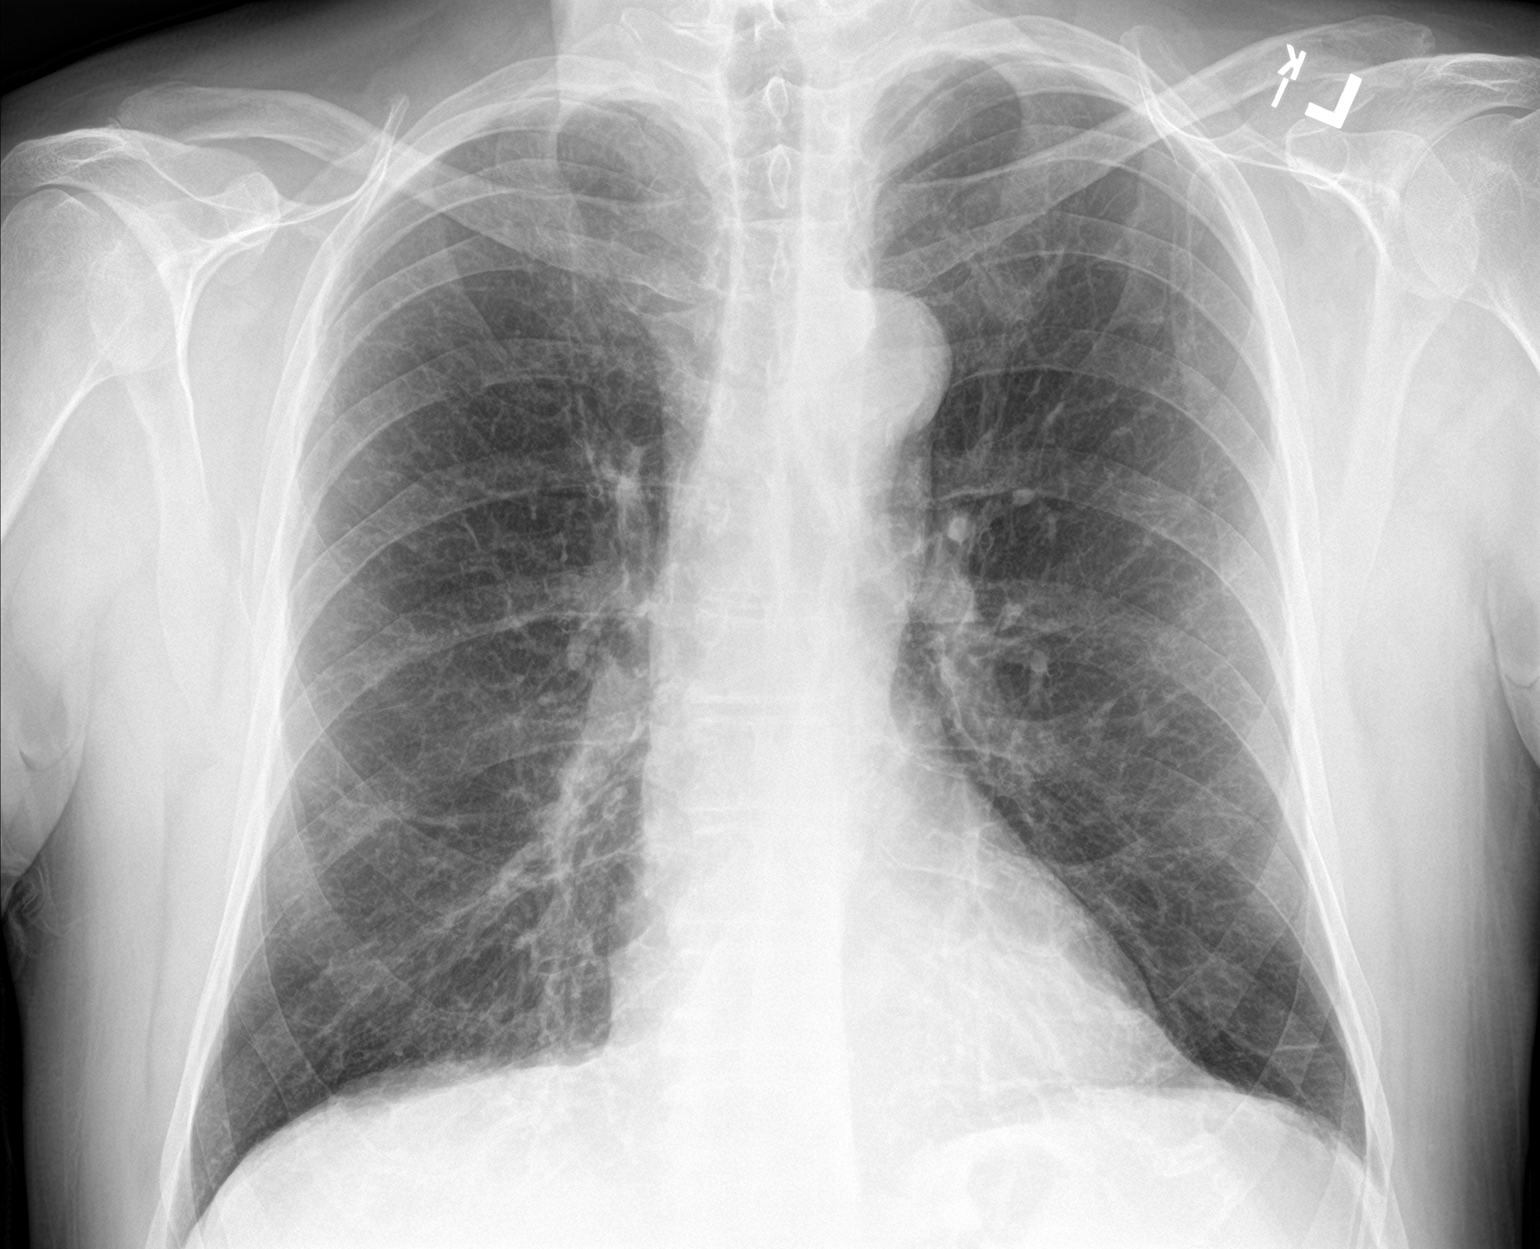

[chest lat]
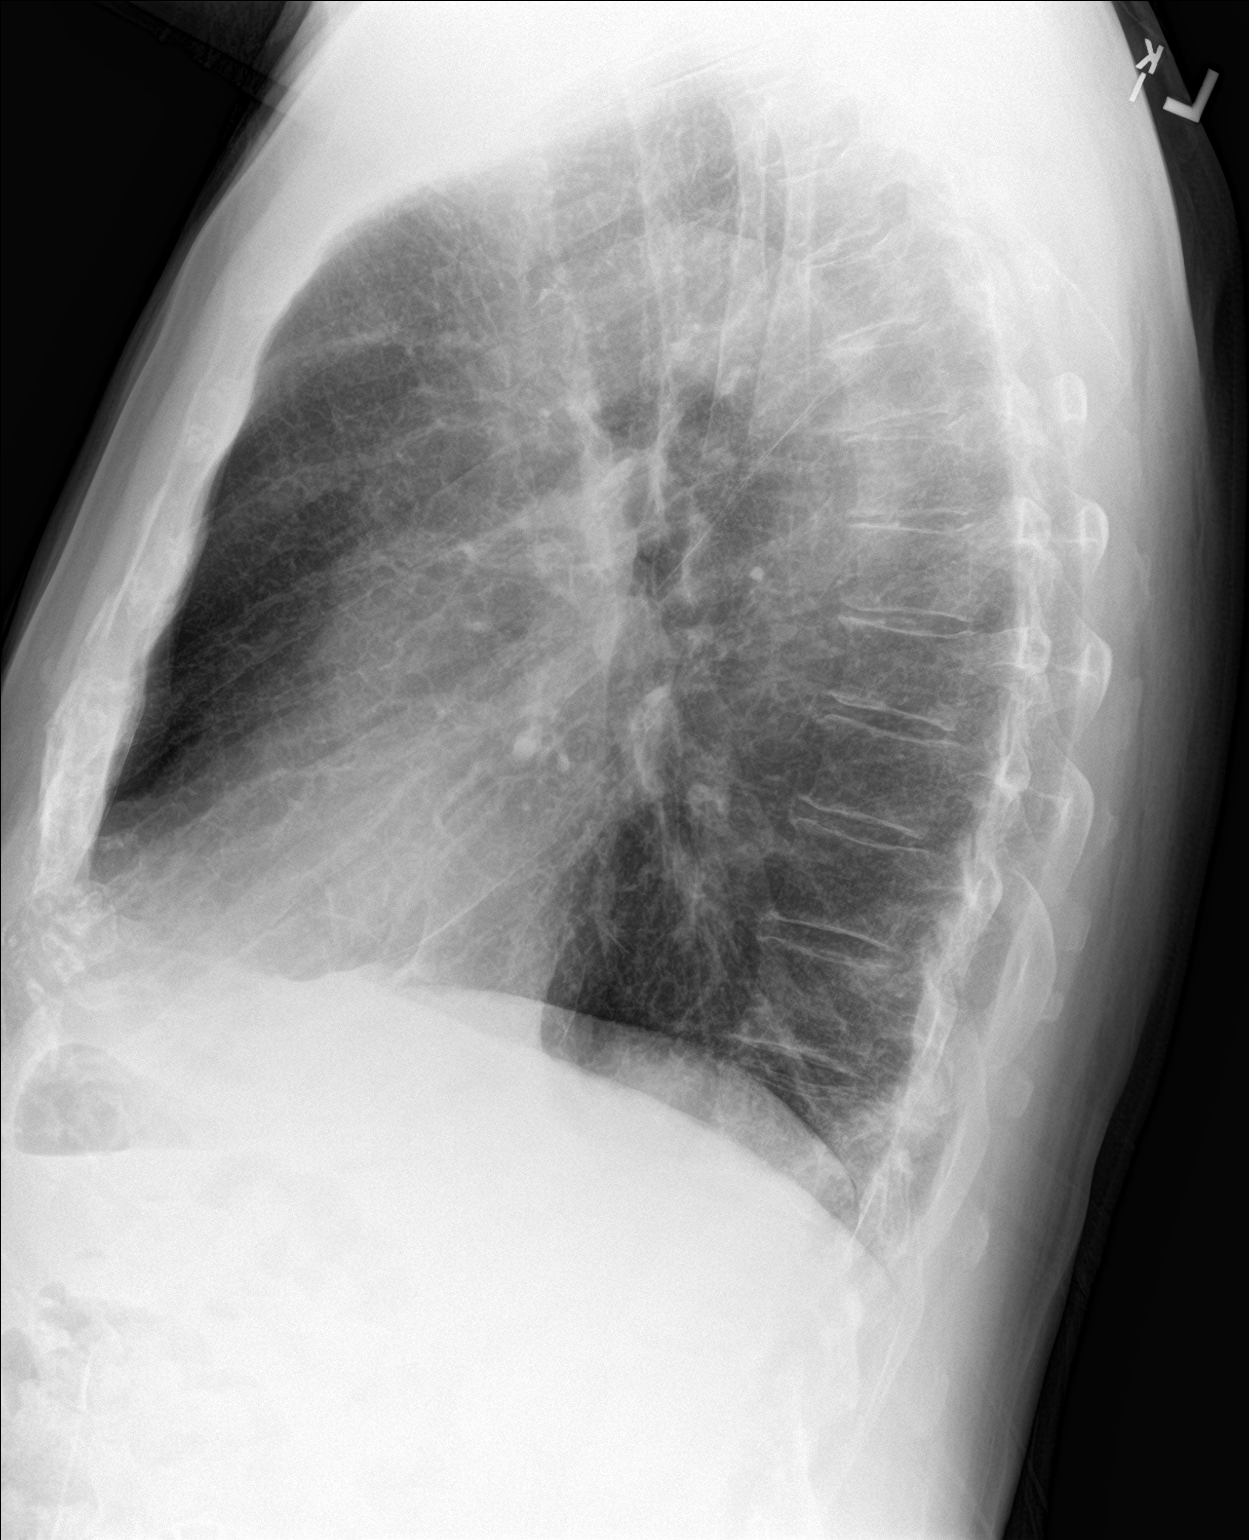

[2 of 2 positions shown; findings below may reference images not displayed]

FINDINGS: There is hyperinflation of the lungs compatible with COPD. Heart and
mediastinal contours are within normal limits. No focal opacities or
effusions. No acute bony abnormality.
IMPRESSION: COPD.  No active disease.

## 2019-08-26 ENCOUNTER — Encounter (HOSPITAL_COMMUNITY): Payer: Self-pay | Admitting: Internal Medicine

## 2019-08-26 ENCOUNTER — Other Ambulatory Visit: Payer: Self-pay

## 2019-08-26 ENCOUNTER — Ambulatory Visit (HOSPITAL_COMMUNITY)
Admission: RE | Admit: 2019-08-26 | Discharge: 2019-08-26 | Disposition: A | Discharge status: Home / Self Care | Payer: Medicare Other | Source: Ambulatory Visit | Attending: Internal Medicine | Admitting: Internal Medicine

## 2019-08-26 VITALS — BP 108/78 | HR 63 | Wt 214.0 lb

## 2019-08-26 DIAGNOSIS — I5023 Acute on chronic systolic (congestive) heart failure: Secondary | ICD-10-CM | POA: Diagnosis not present

## 2019-08-26 DIAGNOSIS — I10 Essential (primary) hypertension: Secondary | ICD-10-CM | POA: Diagnosis not present

## 2019-08-26 DIAGNOSIS — Z8673 Personal history of transient ischemic attack (TIA), and cerebral infarction without residual deficits: Secondary | ICD-10-CM | POA: Insufficient documentation

## 2019-08-26 DIAGNOSIS — I447 Left bundle-branch block, unspecified: Secondary | ICD-10-CM | POA: Diagnosis not present

## 2019-08-26 DIAGNOSIS — I110 Hypertensive heart disease with heart failure: Secondary | ICD-10-CM | POA: Insufficient documentation

## 2019-08-26 DIAGNOSIS — I5022 Chronic systolic (congestive) heart failure: Secondary | ICD-10-CM

## 2019-08-26 DIAGNOSIS — I2510 Atherosclerotic heart disease of native coronary artery without angina pectoris: Secondary | ICD-10-CM | POA: Insufficient documentation

## 2019-08-26 DIAGNOSIS — Z7901 Long term (current) use of anticoagulants: Secondary | ICD-10-CM | POA: Insufficient documentation

## 2019-08-26 DIAGNOSIS — Z79899 Other long term (current) drug therapy: Secondary | ICD-10-CM | POA: Insufficient documentation

## 2019-08-26 DIAGNOSIS — Z87891 Personal history of nicotine dependence: Secondary | ICD-10-CM | POA: Insufficient documentation

## 2019-08-26 DIAGNOSIS — I480 Paroxysmal atrial fibrillation: Secondary | ICD-10-CM | POA: Insufficient documentation

## 2019-08-26 DIAGNOSIS — Z8249 Family history of ischemic heart disease and other diseases of the circulatory system: Secondary | ICD-10-CM | POA: Diagnosis not present

## 2019-08-26 DIAGNOSIS — I428 Other cardiomyopathies: Secondary | ICD-10-CM | POA: Diagnosis not present

## 2019-08-26 DIAGNOSIS — J449 Chronic obstructive pulmonary disease, unspecified: Secondary | ICD-10-CM | POA: Diagnosis not present

## 2019-08-26 LAB — COMPREHENSIVE METABOLIC PANEL
ALT: 29 U/L (ref 0–44)
AST: 21 U/L (ref 15–41)
Albumin: 4.2 g/dL (ref 3.5–5.0)
Alkaline Phosphatase: 76 U/L (ref 38–126)
Anion gap: 12 (ref 5–15)
BUN: 14 mg/dL (ref 8–23)
CO2: 23 mmol/L (ref 22–32)
Calcium: 9.6 mg/dL (ref 8.9–10.3)
Chloride: 102 mmol/L (ref 98–111)
Creatinine, Ser: 1.31 mg/dL — ABNORMAL HIGH (ref 0.61–1.24)
GFR calc Af Amer: >60 mL/min (ref >60)
GFR calc non Af Amer: 56 mL/min — ABNORMAL LOW (ref >60)
Glucose, Bld: 66 mg/dL — ABNORMAL LOW (ref 70–99)
Potassium: 4.7 mmol/L (ref 3.5–5.1)
Sodium: 137 mmol/L (ref 135–145)
Total Bilirubin: 0.6 mg/dL (ref 0.3–1.2)
Total Protein: 7.2 g/dL (ref 6.5–8.1)

## 2019-08-26 LAB — CBC
HCT: 51 % (ref 39.0–52.0)
Hemoglobin: 16.5 g/dL (ref 13.0–17.0)
MCH: 31.1 pg (ref 26.0–34.0)
MCHC: 32.4 g/dL (ref 30.0–36.0)
MCV: 96.2 fL (ref 80.0–100.0)
Platelets: 271 10*3/uL (ref 150–400)
RBC: 5.3 MIL/uL (ref 4.22–5.81)
RDW: 12.6 % (ref 11.5–15.5)
WBC: 11.2 10*3/uL — ABNORMAL HIGH (ref 4.0–10.5)
nRBC: 0 % (ref 0.0–0.2)

## 2019-08-26 LAB — T4, FREE: Free T4: 1.12 ng/dL (ref 0.61–1.12)

## 2019-08-26 LAB — SEDIMENTATION RATE: Sed Rate: 4 mm/hr (ref 0–16)

## 2019-08-26 LAB — TSH: TSH: 2.304 u[IU]/mL (ref 0.350–4.500)

## 2019-08-26 LAB — BRAIN NATRIURETIC PEPTIDE: B Natriuretic Peptide: 76.1 pg/mL (ref 0.0–100.0)

## 2019-08-26 NOTE — Progress Notes (Signed)
Advanced Heart Failure Clinic Note   Patient ID: Reginald Hahn, male   DOB: 1950-12-10, 69 y.o.   MRN: [id number] Primary Cardiologist: Dr. Gala Romney   HPI: [name] "[name]" is a 69 y.o. male with h/o COPD admitted in 6/17 with acute systolic HF, AF with RVR and cardiogenic shock. LBBB  Patient was admitted 6/17 with acute systolic HF and new onset afib with RVR and EF found to be 15%.Felt to to have viral CM exacerbated by afib. Diuresed slowly so PICC placed and co-ox low. Started on IV amio and milrinone and subsequently diuresed well. Was pending cath and TEE/DC-CV but developed massive RP bleed and was transferred to ICU.   All anti-coagulants held. Received multiple transfusions. Subsequently recovered. During that time also had loss of peripheral vision in R eye. Head CT negative. Pre discharge had Myoview with EF 30-44%.  Inferior infarct vs gut attenuation. No ischemia.    Pulmonary Clinic with Dr. Jamison Neighbor and told he has severe COPD FEV1 1.83 (49%) FEF 25-75% 0.60L  DLCO 42%.   Pt admitted from 12/17 -> 04/10/17 with complicated admission consisting of AF with RVR, low output CHF, and URI. Pt transiently required milrinone.  After long discussion pt agreed to be placed on Eliquis, so was then candidate for DCCV with TEE. Pt failed DCCV 04/05/18 initially, but then converted to NSR overnight on IV amidoarone. Pt continued to go in-and-out of Afib throughout his admission. Decision made to send home on continued Amiodarone. Tolerated milrinone wean.    EF 30-35% in 3/19.  He presents today for follow up. We have not seen him in 2 years. Moved back from [place] due to COPD. Feels pretty good. Taking all meds. Every 6 months gets a brief jolt of CP. No palpitations. Still SOB with mild exertion. Can do ADLs without problem.   Myoview 6/17  There was no ST segment deviation noted during stress.  The left ventricular ejection fraction is moderately decreased  (30-44%).  Findings consistent with prior myocardial infarction.  This is an intermediate risk study.  nferior/inferolateral defect (moderate) that does not improve in the rest images consistent with scar and possible soft tissue attenuation. No significant ischemia.  RHC/LHC 08/29/2016 RA = 6 RV = 40/8 PA = 40/13 (24) PCW = 19 Ao = 104/82 (93) LV = 141/17 Fick cardiac output/index = 4.8/2.3 PVR = 1.0 WU SVR 1437  Ao sat = 90% PA sat = 62%, 63% Assessment: 1. Minimal non-obstructive CAD 2. Severe nonischemic CM with EF 25% by cath (EF likely underestimated on re-look likely 40%) 3. Relatively well compensated filling pressures with mild pulmonary venous HTN  Echo 8/17 EF 45% Echo 2/18 EF 45-50% Echo 12/18 EF 10-15% Echo 2/19 EF 25-30% Echo 3/19 EF 30-35%  Review of systems complete and found to be negative unless listed in HPI.    Past Medical History:  Diagnosis Date  . Allergic rhinitis   . Allergy    seasonal allergies  . Arthritis   . Atrial fibrillation (HCC) 03/2017  . Black widow spider bite   . CHF (congestive heart failure) (HCC)   . Chronic systolic heart failure (HCC) 09/16/2015   Possibly tachycardia mediated // Echo 12/18: EF 20-25, trivial AI, mild RAE, PASP 35  . COPD (chronic obstructive pulmonary disease) (HCC)   . Hypertension   . Stroke (cerebrum) (HCC)   . Tobacco use    x40 years. 1 ppd   SH:  Social History   Socioeconomic History  .  Marital status: Divorced    Spouse name: Not on file  . Number of children: Not on file  . Years of education: Not on file  . Highest education level: Not on file  Occupational History  . Not on file  Tobacco Use  . Smoking status: Former Smoker    Packs/day: 2.00    Years: 50.00    Pack years: 100.00    Types: Cigarettes    Start date: 04/14/1963    Quit date: 10/09/2015    Years since quitting: 3.8  . Smokeless tobacco: Never Used  . Tobacco comment: stopped for a couple of years in his 30s   Substance and Sexual Activity  . Alcohol use: No    Alcohol/week: 0.0 standard drinks  . Drug use: No    Comment: Used to smoke marijuana  . Sexual activity: Not on file  Other Topics Concern  . 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 No pets currently. Remote exposure to a parrot for 2 years.    Social Determinants of Health   Financial Resource Strain:   . Difficulty of Paying Living Expenses:   Food Insecurity:   . Worried About Programme researcher, broadcasting/film/video in the Last Year:   . Barista in the Last Year:   Transportation Needs:   . Freight forwarder (Medical):   Marland Kitchen Lack of Transportation (Non-Medical):   Physical Activity:   . Days of Exercise per Week:   . Minutes of Exercise per Session:   Stress:   . Feeling of Stress :   Social Connections:   . Frequency of Communication with Friends and Family:   . Frequency of Social Gatherings with Friends and Family:   . Attends Religious Services:   . Active Member of Clubs or Organizations:   . Attends Banker Meetings:   Marland Kitchen Marital Status:   Intimate Partner Violence:   . Fear of Current or Ex-Partner:   . Emotionally Abused:   Marland Kitchen Physically Abused:   . Sexually Abused:     FH:  Family History  Problem Relation Age of Onset  . Heart disease Mother        died in her 4's.  . Diabetes Mother   . Prostate cancer Father        died in his 79's.  . Congenital heart disease Brother        died @ age 5.  . Lung disease Neg Hx     Past Medical History:  Diagnosis Date  . Allergic rhinitis   . Allergy    seasonal allergies  . Arthritis   . Atrial fibrillation (HCC) 03/2017  . Black widow spider bite   . CHF (congestive heart  failure) (HCC)   . Chronic systolic heart failure (HCC) 09/16/2015   Possibly tachycardia mediated // Echo 12/18: EF 20-25, trivial AI, mild RAE, PASP 35  . COPD (chronic obstructive pulmonary disease) (HCC)   . Hypertension   . Stroke (cerebrum) (HCC)   . Tobacco use    x40 years. 1 ppd    Current Outpatient Medications  Medication Sig Dispense Refill  . acetaminophen (TYLENOL) 325 MG tablet Take  650 mg by mouth every 6 (six) hours as needed for mild pain or fever.    Marland Kitchen albuterol (VENTOLIN HFA) 108 (90 Base) MCG/ACT inhaler Inhale 2 puffs into the lungs every 4 (four) hours as needed for wheezing or shortness of breath. 18 g 11  . amiodarone (PACERONE) 200 MG tablet Take 1 tablet (200 mg total) by mouth daily. 30 tablet 0  . benzonatate (TESSALON) 200 MG capsule Take 1 capsule (200 mg total) by mouth 3 (three) times daily as needed for cough. 60 capsule 1  . bisoprolol (ZEBETA) 5 MG tablet Take 0.5 tablets (2.5 mg total) by mouth daily. LAST REFILL WITHOUT OFFICE VISIT [phone] 15 tablet 0  . budesonide-formoterol (SYMBICORT) 160-4.5 MCG/ACT inhaler Inhale 2 puffs into the lungs 2 (two) times daily. 1 Inhaler 11  . cetirizine (ZYRTEC) 10 MG tablet Take 10 mg by mouth daily.    . chlorhexidine (PERIDEX) 0.12 % solution Use as directed 15 mLs in the mouth or throat 2 (two) times daily.    . furosemide (LASIX) 40 MG tablet Take 1 tablet (40 mg total) by mouth daily. LAST REFILL WITHOUT OFFICE VISIT [phone] 30 tablet 0  . losartan (COZAAR) 100 MG tablet Take 1 tablet (100 mg total) by mouth daily. LAST REFILL WITHOUT OFFICE VISIT [phone] 30 tablet 0  . omeprazole (PRILOSEC) 20 MG capsule Take 1 capsule (20 mg total) by mouth daily. LAST REFILL WITHOUT OFFICE VISIT [phone] ADDITIONAL REFILLS SHOULD COME FROM PCP FOR THIS MEDICATION 30 capsule 0  . potassium chloride SA (K-DUR,KLOR-CON) 20 MEQ tablet Take 1 tablet (20 mEq total) by mouth daily. 90 tablet 3  . spironolactone  (ALDACTONE) 25 MG tablet Take 1 tablet (25 mg total) by mouth daily. LAST REFILL WITHOUT OFFICE VISIT [phone] 90 tablet 0   No current facility-administered medications for this encounter.   Vitals:   08/26/19 0929  BP: 108/78  Pulse: 63  SpO2: 94%  Weight: 97.1 kg (214 lb)   Wt Readings from Last 3 Encounters:  08/26/19 97.1 kg (214 lb)  12/26/18 93.9 kg (207 lb)  12/27/17 96.6 kg (213 lb)    PHYSICAL EXAM: General:  Well appearing. No resp difficulty HEENT: normal Neck: supple. no JVD. Carotids 2+ bilat; no bruits. No lymphadenopathy or thryomegaly appreciated. Cor: PMI nondisplaced. Regular rate & rhythm. No rubs, gallops or murmurs. Lungs: clear with decreased BS throughout  Abdomen: soft, nontender, nondistended. No hepatosplenomegaly. No bruits or masses. Good bowel sounds. Extremities: no cyanosis, clubbing, rash, edema Neuro: alert & orientedx3, cranial nerves grossly intact. moves all 4 extremities w/o difficulty. Affect pleasant  ECG: NSR 62 1AVB 260 ms LBBB 132 ms Personally reviewed   ASSESSMENT & PLAN:  1. Chronic Systolic HF due to tachy-induced CM in the setting of recurrent AF in setting of viral illness - Echo 03/28/2017 LVEF ~20%, worsened from Echo 2/18 EF 45-50% in setting of Afib RVR.  - Echo 06/26/17 LVEF 30-35%, Grade 1 DD, trivial AI - Echo 9/19 EF 40-45% with LBBB  - Stable NYHA II-III from COPD mostly  - Volume status stable on exam.  - Continue lasix 40 mg daily  - Continue spiro 25 mg daily.  - Continue losartan to 100 mg daily. He has refused Entresto with cost (unable to mitigate due to high co-pay -> $135 per month)  - Continue bisoprolol 2.5 - Reinforced fluid restriction to < 2 L daily, sodium restriction to less than 2000 mg daily, and the importance of daily weights.   -  will repeat echo.   2. PAF  - Remains in NSR - Continue amiodarone 200 mg daily - Tolerating Eliquis without bleeding - Previously referred to EP to discuss  possible AF ablation as he has had 2 life-threatening episodes of cardiogenic shock in setting of PAF but he has deferred. He is aware at long-term risks of amiodarone- check amio labs   3. COPD - Sees Dr. Sherene Sires - Continue spiriva. Limit ventolin to as needed.  - No longer smoking  4. H/o Transient loss of R peripheral vision - Concerning for embolic CVA in setting of AF. Has resolved.  - Pt declines Neuro referral, MRI, and/or carotid dopplers. No change. - Now on Eliquis as above.   5. HTN - Blood pressure well controlled. Continue current regimen.  6. LBBB - Stable on recent EKG. QRS ~132 ms    Arvilla Meres, MD  10:15 AM

## 2019-08-26 NOTE — Patient Instructions (Signed)
Labs done today, we will notify you of abnormal results  Your physician has requested that you have an echocardiogram. Echocardiography is a painless test that uses sound waves to create images of your heart. It provides your doctor with information about the size and shape of your heart and how well your heart's chambers and valves are working. This procedure takes approximately one hour. There are no restrictions for this procedure.  Please call us in 1 year to schedule your follow up appointment  If you have any questions or concerns before your next appointment please send [name] a message through [place] or call our office at [phone].

## 2019-08-26 NOTE — Addendum Note (Signed)
Encounter addended by: Noralee Space, RN on: 08/26/2019 10:36 AM  Actions taken: Order list changed, Diagnosis association updated, Clinical Note Signed, Charge Capture section accepted

## 2019-08-27 LAB — T3, FREE: T3, Free: 2.7 pg/mL (ref 2.0–4.4)

## 2019-09-05 ENCOUNTER — Other Ambulatory Visit (HOSPITAL_COMMUNITY): Payer: Self-pay | Admitting: *Deleted

## 2019-09-05 MED ORDER — OMEPRAZOLE 20 MG PO CPDR: 20.0000 mg | DELAYED_RELEASE_CAPSULE | Freq: Every day | ORAL | 0 refills | Status: DC

## 2019-09-09 ENCOUNTER — Other Ambulatory Visit (HOSPITAL_COMMUNITY): Payer: Self-pay | Admitting: *Deleted

## 2019-09-09 ENCOUNTER — Ambulatory Visit (HOSPITAL_COMMUNITY): Payer: Medicare Other

## 2019-09-09 MED ORDER — LOSARTAN POTASSIUM 100 MG PO TABS: 100.0000 mg | ORAL_TABLET | Freq: Every day | ORAL | 3 refills | Status: DC

## 2019-09-09 MED ORDER — SPIRONOLACTONE 25 MG PO TABS: 25.0000 mg | ORAL_TABLET | Freq: Every day | ORAL | 3 refills | Status: DC

## 2019-09-09 MED ORDER — POTASSIUM CHLORIDE CRYS ER 20 MEQ PO TBCR: 20.0000 meq | EXTENDED_RELEASE_TABLET | Freq: Every day | ORAL | 3 refills | Status: DC

## 2019-09-09 MED ORDER — FUROSEMIDE 40 MG PO TABS: 40.0000 mg | ORAL_TABLET | Freq: Every day | ORAL | 3 refills | Status: DC

## 2019-09-09 MED ORDER — BISOPROLOL FUMARATE 5 MG PO TABS: 2.5000 mg | ORAL_TABLET | Freq: Every day | ORAL | 3 refills | Status: DC

## 2019-09-09 MED ORDER — AMIODARONE HCL 200 MG PO TABS: 200.0000 mg | ORAL_TABLET | Freq: Every day | ORAL | 3 refills | Status: DC

## 2019-09-17 ENCOUNTER — Ambulatory Visit (HOSPITAL_COMMUNITY): Payer: Medicare Other

## 2019-10-05 ENCOUNTER — Other Ambulatory Visit (HOSPITAL_COMMUNITY): Payer: Self-pay | Admitting: Internal Medicine

## 2019-10-23 ENCOUNTER — Other Ambulatory Visit: Payer: Self-pay | Admitting: Internal Medicine

## 2019-10-23 NOTE — Telephone Encounter (Signed)
Patient contacted regarding refill for Symbicort. He needs a follow up visit with Dr. Sherene Sires based on last OV notes. He also had orders for a PFT which he is refusing. Refill sent, patient encouraged to schedule a visit with Dr. Sherene Sires to get future refills.

## 2019-11-17 ENCOUNTER — Telehealth: Payer: Self-pay | Admitting: Internal Medicine

## 2019-11-17 NOTE — Telephone Encounter (Signed)
LMTCB for Reginald Hahn- what inhaler??

## 2019-11-18 MED ORDER — SYMBICORT 160-4.5 MCG/ACT IN AERO: 2.0000 | INHALATION_SPRAY | Freq: Two times a day (BID) | RESPIRATORY_TRACT | 0 refills | Status: DC

## 2019-11-18 MED ORDER — ALBUTEROL SULFATE HFA 108 (90 BASE) MCG/ACT IN AERS: 2.0000 | INHALATION_SPRAY | RESPIRATORY_TRACT | 0 refills | Status: DC | PRN

## 2019-11-18 NOTE — Telephone Encounter (Signed)
Spoke with pt's ex-wife, Marylu Lund. States that pt is needing refills on Symbicort and Albuterol. Pt has an appointment with MW on 11/24/2019. Rxs have been sent in. Nothing further was needed.

## 2019-11-21 ENCOUNTER — Other Ambulatory Visit (HOSPITAL_COMMUNITY): Payer: Medicare Other

## 2019-11-21 DIAGNOSIS — Z20822 Contact with and (suspected) exposure to covid-19: Secondary | ICD-10-CM | POA: Diagnosis not present

## 2019-11-24 ENCOUNTER — Other Ambulatory Visit: Payer: Self-pay

## 2019-11-24 ENCOUNTER — Ambulatory Visit (INDEPENDENT_AMBULATORY_CARE_PROVIDER_SITE_OTHER): Payer: Medicare Other

## 2019-11-24 ENCOUNTER — Ambulatory Visit (INDEPENDENT_AMBULATORY_CARE_PROVIDER_SITE_OTHER): Payer: Medicare Other | Admitting: Internal Medicine

## 2019-11-24 ENCOUNTER — Encounter: Payer: Self-pay | Admitting: Internal Medicine

## 2019-11-24 DIAGNOSIS — R0600 Dyspnea, unspecified: Secondary | ICD-10-CM

## 2019-11-24 DIAGNOSIS — J449 Chronic obstructive pulmonary disease, unspecified: Secondary | ICD-10-CM

## 2019-11-24 DIAGNOSIS — R0609 Other forms of dyspnea: Secondary | ICD-10-CM

## 2019-11-24 MED ORDER — ALBUTEROL SULFATE HFA 108 (90 BASE) MCG/ACT IN AERS: 2.0000 | INHALATION_SPRAY | RESPIRATORY_TRACT | 11 refills | Status: DC | PRN

## 2019-11-24 MED ORDER — OMEPRAZOLE 20 MG PO CPDR: DELAYED_RELEASE_CAPSULE | ORAL | 3 refills | Status: DC

## 2019-11-24 MED ORDER — BREZTRI AEROSPHERE 160-9-4.8 MCG/ACT IN AERO: 2.0000 | INHALATION_SPRAY | Freq: Two times a day (BID) | RESPIRATORY_TRACT | 0 refills | Status: DC

## 2019-11-24 NOTE — Patient Instructions (Addendum)
Plan A = Automatic = Always=    Breztri Take 2 puffs first thing in am and then another 2 puffs about 12 hours later.     Plan B = Backup (to supplement plan A, not to replace it) Only use your albuterol inhaler as a rescue medication to be used if you can't catch your breath by resting or doing a relaxed purse lip breathing pattern.  - The less you use it, the better it will work when you need it. - Ok to use the inhaler up to 2 puffs  every 4 hours if you must but call for appointment if use goes up over your usual need - Don't leave home without it !!  (think of it like the spare tire for your car)   Plan C = Crisis (instead of Plan B but only if Plan B stops working) - only use your albuterol nebulizer if you first try Plan B and it fails to help > ok to use the nebulizer up to every 4 hours but if start needing it regularly call for immediate appointment  Try bed blocks 6-8 in under head board and try taking prilosec Take 30- 60 min before your first and last meals of the day x one month to see if problem with gasping for breath p lying down is better    I very strongly recommend you get the moderna or pfizer vaccine as soon as possible based on your risk of dying from the virus  and the proven safety and benefit of these vaccines against even the delta variant.  This can save your life as well as  those of your loved ones,  especially if they are also not vaccinated.      Please schedule a follow up visit in 12  months but call sooner if needed  - if you like the breztri but can't afford can make appt to see our pharmacist  - needs hrct due to cxr findings plus esr/bnp vs priors (both nl 08/26/19)

## 2019-11-24 NOTE — Progress Notes (Signed)
Subjective:    Patient ID: Reginald Hahn, male    DOB: 06/09/1950, 69 y.o.   MRN: [id number]    Brief patient profile:  34 yowm MZ quit smoking 10/2015  With GOLD III copd     History of Present Illness  08/31/2016  f/u ov/Reginald Hahn re: MZ/ COPD Gold  II flare of cough > sob x 2 weeks/ Chief Complaint  Patient presents with  . Acute Visit    Increased cough x 2 wks. He is coughing the the point his chest feels sore. He occ produces minima clear sputum. He states he occ coughs until the point the he gets dizzy and passes out. He is using albuterol inhaler 3-4 x per day.   cough had improved on trelegy then gradually worse cough esp at hs  Most dry but quite severe  Doe = MMRC2 = can't walk a nl pace on a flat grade s sob but does fine slow and flat eg shopping rec  Try prilosec otc 20mg   Take 30-60 min before first meal of the day and Pepcid ac (famotidine) 20 mg one @  Bedtime GERD diet  For cough > mucinex dm 1200 mg twice daily as needed and flutter valve  Stop trelegy and start symbicort 80 Take 2 puffs first thing in am and then another 2 puffs about 12 hours later.       12/26/2018  f/u ov/Reginald Hahn re:  GOLD II COPD / MZ maint on symb 160 2bid  But also still on amiodarone 200 mg daily  Chief Complaint  Patient presents with  . Follow-up    Needing refill on rescue inhaler. He gets winded walking flat surface for about 10 min.   Dyspnea:  MMRC2 = can't walk a nl pace on a flat grade s sob but does fine slow and flat  Faster pace stops p 10 min Cough: some am cough white does not wake him up or disturb sleep. Sleeping: lie flat sleep on L side / 1 pillow SABA use: ventolin hs but not using prn approp rec Plan A = Automatic = Always=    Symbicort 160 Take 2 puffs first thing in am and then another 2 puffs about 12 hours later.  Plan B = Backup (to supplement plan A, not to replace it) Only use your albuterol inhaler as a rescue medication For cough>  Tessalon 200 mg one every 6  hours as needed  Make sure you check your oxygen saturations at highest level of activity to be sure it stays over 90% Please schedule a follow up visit in 3 months but call sooner if needed with pfts    11/24/2019  f/u ov/Reginald Hahn re:  COPD II  COPD / MZ/ still on amiodarone  Chief Complaint  Patient presents with  . Follow-up    go over pft results  Dyspnea:  Walks across parking lot/ does 11/26/2019 but feels he's losing ground Cough: variable all day  Sleeping awakens  gasping for breath every night x one year / bed is flat  SABA use:  3-4 x per day  02: none    No obvious day to day or daytime variability or assoc excess/ purulent sputum or mucus plugs or hemoptysis or cp or chest tightness, subjective wheeze or overt sinus or hb symptoms.    Also denies any obvious fluctuation of symptoms with weather or environmental changes or other aggravating or alleviating factors except as outlined above   No unusual exposure hx  or h/o childhood pna/ asthma or knowledge of premature birth.  Current Allergies, Complete Past Medical History, Past Surgical History, Family History, and Social History were reviewed in [name] record.  ROS  The following are not active complaints unless bolded Hoarseness, sore throat, dysphagia, dental problems, itching, sneezing,  nasal congestion or discharge of excess mucus or purulent secretions, ear ache,   fever, chills, sweats, unintended wt loss or wt gain, classically pleuritic or exertional cp,  orthopnea pnd or arm/hand swelling  or leg swelling, presyncope, palpitations, abdominal pain, anorexia, nausea, vomiting, diarrhea  or change in bowel habits or change in bladder habits, change in stools or change in urine, dysuria, hematuria,  rash, arthralgias, visual complaints, headache, numbness, weakness or ataxia or problems with walking or coordination,  change in mood or  memory.        Current Meds  Medication Sig  .  acetaminophen (TYLENOL) 325 MG tablet Take 650 mg by mouth every 6 (six) hours as needed for mild pain or fever.  Marland Kitchen albuterol (VENTOLIN HFA) 108 (90 Base) MCG/ACT inhaler Inhale 2 puffs into the lungs every 4 (four) hours as needed for wheezing or shortness of breath.  Marland Kitchen amiodarone (PACERONE) 200 MG tablet Take 1 tablet (200 mg total) by mouth daily.  . benzonatate (TESSALON) 200 MG capsule Take 1 capsule (200 mg total) by mouth 3 (three) times daily as needed for cough.  . bisoprolol (ZEBETA) 5 MG tablet Take 0.5 tablets (2.5 mg total) by mouth daily.  . cetirizine (ZYRTEC) 10 MG tablet Take 10 mg by mouth daily.  . furosemide (LASIX) 40 MG tablet Take 1 tablet (40 mg total) by mouth daily.  Marland Kitchen losartan (COZAAR) 100 MG tablet Take 1 tablet (100 mg total) by mouth daily.  Marland Kitchen omeprazole (PRILOSEC) 20 MG capsule Take 1 capsule by mouth once daily  . potassium chloride SA (KLOR-CON) 20 MEQ tablet Take 1 tablet (20 mEq total) by mouth daily.  Marland Kitchen spironolactone (ALDACTONE) 25 MG tablet Take 1 tablet (25 mg total) by mouth daily.  . SYMBICORT 160-4.5 MCG/ACT inhaler Inhale 2 puffs into the lungs 2 (two) times daily.                         Objective:   Physical Exam     11/24/2019       214 12/26/2018        207  12/27/2017        213   05/25/2017      196   08/31/16 201 lb (91.2 kg)  08/29/16 202 lb (91.6 kg)  08/25/16 201 lb 4 oz (91.3 kg)     Vital signs reviewed  11/24/2019  - Note at rest 02 sats  92% on RA     HEENT : pt wearing mask not removed for exam due to covid - 19 concerns.    NECK :  without JVD/Nodes/TM/ nl carotid upstrokes bilaterally   LUNGS: no acc muscle use,  Mild barrel  contour chest wall with bilateral  Distant bs s audible wheeze and  without cough on insp or exp maneuvers  and mild  Hyperresonant  to  percussion bilaterally     CV:  RRR  no s3 or murmur or increase in P2, and no edema   ABD:  soft and nontender with pos end  insp Hoover's  in the supine  position. No bruits or organomegaly appreciated, bowel sounds nl  MS:  Nl gait/  ext warm without deformities, calf tenderness, cyanosis or clubbing No obvious joint restrictions   SKIN: warm and dry without lesions    NEURO:  alert, approp, nl sensorium with  no motor or cerebellar deficits apparent.                 CXR PA and Lateral:   11/24/2019 :    I personally reviewed images and agree with radiology impression as follows:   1. The appearance of the lungs may suggest interstitial lung disease. Further evaluation with high-resolution chest CT is recommended to better evaluate these findings.        Assessment & Plan:

## 2019-11-24 NOTE — Progress Notes (Signed)
Full PFT performed today. °

## 2019-11-25 ENCOUNTER — Encounter: Payer: Self-pay | Admitting: Internal Medicine

## 2019-11-25 LAB — PULMONARY FUNCTION TEST
DL/VA % pred: 56 %
DL/VA: 2.3 ml/min/mmHg/L
DLCO cor % pred: 54 %
DLCO cor: 15.13 ml/min/mmHg
DLCO unc % pred: 54 %
DLCO unc: 15.13 ml/min/mmHg
FEF 25-75 Post: 1 L/sec
FEF 25-75 Pre: 0.83 L/sec
FEF2575-%Change-Post: 20 %
FEF2575-%Pred-Post: 36 %
FEF2575-%Pred-Pre: 30 %
FEV1-%Change-Post: -4 %
FEV1-%Pred-Post: 49 %
FEV1-%Pred-Pre: 52 %
FEV1-Post: 1.78 L
FEV1-Pre: 1.87 L
FEV1FVC-%Change-Post: -11 %
FEV1FVC-%Pred-Pre: 73 %
FEV6-%Change-Post: 4 %
FEV6-%Pred-Post: 77 %
FEV6-%Pred-Pre: 74 %
FEV6-Post: 3.54 L
FEV6-Pre: 3.4 L
FEV6FVC-%Change-Post: 0 %
FEV6FVC-%Pred-Post: 104 %
FEV6FVC-%Pred-Pre: 105 %
FVC-%Change-Post: 7 %
FVC-%Pred-Post: 76 %
FVC-%Pred-Pre: 71 %
FVC-Post: 3.69 L
FVC-Pre: 3.44 L
Post FEV1/FVC ratio: 48 %
Post FEV6/FVC ratio: 99 %
Pre FEV1/FVC ratio: 54 %
Pre FEV6/FVC Ratio: 100 %
RV % pred: 105 %
RV: 2.65 L
TLC % pred: 88 %
TLC: 6.6 L

## 2019-11-25 NOTE — Assessment & Plan Note (Addendum)
Amiodarone rx since at least 09/28/15  - no change dlco vs 2018 as of 11/24/2019 but cxr much worse  cxr is suggestive he may be developing ILD so rec  Return for bnp /esr since both were nl as recently as 08/26/19  Elevate HOB In case getting noct gerd/asp  need to proceed with HRCT and consider stopping amiodarone/ Dr Missy Sabins notified          Medical decision making was a moderate level of complexity in this case because of  two chronic conditions /diagnoses requiring extra time for  H and P, chart review, counseling,   and generating customized AVS unique to this office visit and charting.   Each maintenance medication was reviewed in detail including emphasizing most importantly the difference between maintenance and prns and under what circumstances the prns are to be triggered using an action plan format where appropriate. Please see avs for details which were reviewed in writing by both me and my nurse and patient given a written copy highlighted where appropriate with yellow highlighter for the patient's continued care at home along with an updated version of their medications.  Patient was asked to maintain medication reconciliation by comparing this list to the actual medications being used at home and to contact this office right away if there is a conflict or discrepancy.

## 2019-11-25 NOTE — Assessment & Plan Note (Addendum)
Quit smoking 10/2015  PFT 06/15/16: FVC 4.05 L (92%) FEV1 1.33 L (49%) FEV1/FVC 0.45 and dlco corrected 45%   Alpha one 08/01/16:   Level 99/ MZ  - Spirometry 05/25/2017  FEV1 2.09 (57%)  Ratio 50 with classic curvature and no rx prior  -  05/25/2017  Walked RA x 3 laps @ 185 ft each stopped due to  End of study, nl pace, no sob or desat  - 12/26/2018  After extensive coaching inhaler device,  effectiveness =    90%  - PFT's  11/24/2019  FEV1 1.87 (52 % ) ratio 0.54  p 0 % improvement from saba p 0 prior to study with DLCO  15.13 (54%) corrects to 2.30 (56%)  for alv volume and FV curve mild curve  - 11/24/2019 trial of breztri    Group D in terms of symptom/risk and laba/lama/ICS  therefore appropriate rx at this point >>>  Try breztri  MZ not likely the cause of his worse doe as cxr suggests ILD now ? Amiodarone > see DOE a/p  Advised:  formulary restrictions will be an ongoing challenge for the forseable future and I would be happy to pick an alternative if the pt will first  provide me a list of them -  pt  will need to return here for training for any new device that is required eg dpi vs hfa vs respimat.    In the meantime we can always provide samples so that the patient never runs out of any needed respiratory medications.   Pt informed of the seriousness of COVID 19 infection as a direct risk to lung health  and safey and to close contacts and should continue to wear a facemask in public and minimize exposure to public locations but especially avoid any area or activity where non-close contacts are not observing distancing or wearing an appropriate face mask.  I strongly recommended she take either of the vaccines available through local drugstores based on updated information on millions of Americans treated with the Moderna and [employer] products  which have proven both safe and  effective even against the new delta variant.    Marland Kitchen

## 2019-11-25 NOTE — Progress Notes (Signed)
Attempted to call patient with results but line rang busy signal.

## 2019-11-26 ENCOUNTER — Other Ambulatory Visit: Payer: Self-pay | Admitting: Internal Medicine

## 2019-11-26 DIAGNOSIS — J849 Interstitial pulmonary disease, unspecified: Secondary | ICD-10-CM

## 2019-11-27 ENCOUNTER — Telehealth: Payer: Self-pay | Admitting: Internal Medicine

## 2019-11-27 NOTE — Telephone Encounter (Signed)
Ok to hold off on HRCT and my apologies to the pt, the button I pushed on the cxr was the default response and I sent the correct info thru another format he didn't get because Verlon Au is still out this week.  But he does need the hrct when he returns as the cxr shows increasing markings that could be related to amiodarone (notified Benshimon as well)  > no change in rx pending the scan or instructions by Benshimon

## 2019-11-27 NOTE — Telephone Encounter (Signed)
Called spoke with patient Not sure why he needs HRCT if CXR was normal and no changes.  Shared his CXR results with him. Patient is out of town for a month he said he would call when he got back into town.   Dr. Sherene Sires please advise why HRCT [facility] please cancel CT

## 2019-11-27 NOTE — Telephone Encounter (Signed)
ATC patient unable to reach LM to call back office (x1)  

## 2019-11-27 NOTE — Telephone Encounter (Signed)
I called the patient to give him his CT appt info for 8/31 @ 8:30 AM at [facility].  Pt did not know that a CT had been ordered yet & states he hasn't gotten his CXR results back yet.  Pt asks to get his results before discussing the CT appt.    If the patient is agreeable to complete the CT, please provide the following info to the pt:  CT is scheduled for 8/31 @ 8:30 at [facility].  Pt will need to arrive 15 minutes early for check in  & there is no special prep which means he can eat & drink like he normally would.   If the patient is NOT agreeable to the CT, please let me know.   Pt can be reached at [phone].   Thank you!   

## 2019-12-03 ENCOUNTER — Telehealth (HOSPITAL_COMMUNITY): Payer: Self-pay | Admitting: Cardiology

## 2019-12-03 NOTE — Telephone Encounter (Signed)
Patient called with concerns of chest pains after starting new inhaler Zannie Cove). Reports he was on inhaler two days and symptoms started, was able continue x 1 week before stopping.  Describes CP as sharp pain in the center of his chest. Pains subsided after 1-2 mins.  Denied SOB, jaw pain,arm pain, dizziness etc Since stopping inhaler pains have NOT returned.   would like cardiology to weigh in before following up with pulmonology

## 2019-12-03 NOTE — Telephone Encounter (Signed)
He had a heart cath several years ago with no significant coronary artery disease. I doubt his symptoms are cardiac in nature.

## 2019-12-04 NOTE — Telephone Encounter (Signed)
Patient advised and verbalized understanding. However, he did not sound too happy with this response. Advised patient to fu with pulmonology

## 2019-12-04 NOTE — Telephone Encounter (Signed)
LMOM

## 2019-12-05 ENCOUNTER — Telehealth: Payer: Self-pay | Admitting: Internal Medicine

## 2019-12-05 MED ORDER — BUDESONIDE-FORMOTEROL FUMARATE 160-4.5 MCG/ACT IN AERO
2.0000 | INHALATION_SPRAY | Freq: Two times a day (BID) | RESPIRATORY_TRACT | 11 refills | Status: DC
Start: 2019-12-05 — End: 2020-10-22

## 2019-12-05 NOTE — Telephone Encounter (Signed)
Pt states he tried Clinical cytogeneticist when MW gave pt samples. Pt tried for a week, but pt had heart pain. Stopped after a week and heart has returned to normal. Has been off for 6 days. Wanting MW to represcribe/refill Symbicort and Ventolin rescue inhaler. Pt would like prescriptions sent to [facility] on [facility], but would like it to be held about 2 weeks. Pt has enough of both medicines to last about 2 weeks. Please advise. [phone]

## 2019-12-05 NOTE — Telephone Encounter (Signed)
Spoke with the pt and notified of response per Dr Sherene Sires  He verbalized understanding  Nothing further needed Rx for symbicort sent

## 2019-12-05 NOTE — Telephone Encounter (Signed)
Sorry to hear this but the only ingredient that breztri has that symbicort does not have is not at all active on the heart but glad better back on symbicort ok to refill x 1 year

## 2019-12-05 NOTE — Telephone Encounter (Signed)
Spoke with the pt  He was seen 11/24/19- switched from symb 160  to [employer] med caused him to have sharp CP off and on "right where my heart is" He took it for 7 days and then went back to symbicort  Has been off for the past 6 days and CP has been completely gone for the past 4   He is asking if okay to get a new rx for symbicort- still has 2 wks worth left  He has not had covid vax but plans to get soon  Please advise thanks

## 2019-12-09 ENCOUNTER — Ambulatory Visit (HOSPITAL_COMMUNITY): Payer: Medicare Other

## 2020-02-11 ENCOUNTER — Telehealth: Payer: Self-pay | Admitting: Internal Medicine

## 2020-02-11 MED ORDER — BREZTRI AEROSPHERE 160-9-4.8 MCG/ACT IN AERO: 2.0000 | INHALATION_SPRAY | Freq: Two times a day (BID) | RESPIRATORY_TRACT | 11 refills | Status: DC

## 2020-02-11 NOTE — Telephone Encounter (Signed)
Called and spoke with the pt  I have sent the rx for Breztri to preferred pharm  Nothing further needed

## 2020-03-11 ENCOUNTER — Telehealth: Payer: Self-pay | Admitting: Internal Medicine

## 2020-03-11 MED ORDER — BREZTRI AEROSPHERE 160-9-4.8 MCG/ACT IN AERO: 2.0000 | INHALATION_SPRAY | Freq: Two times a day (BID) | RESPIRATORY_TRACT | 0 refills | Status: DC

## 2020-03-11 NOTE — Telephone Encounter (Signed)
Called and spoke with pt and he is aware of 1 sample that has been left up front due to low stock.  He is aware to call back in a week or so to see if any new samples have come in.  He is getting new insurance the beginning of the year and right now this medication  Is too expensive for him to afford.

## 2020-04-27 ENCOUNTER — Other Ambulatory Visit: Payer: Self-pay | Admitting: Internal Medicine

## 2020-04-29 ENCOUNTER — Other Ambulatory Visit: Payer: Self-pay | Admitting: Internal Medicine

## 2020-04-29 ENCOUNTER — Other Ambulatory Visit (HOSPITAL_COMMUNITY): Payer: Self-pay | Admitting: *Deleted

## 2020-04-29 ENCOUNTER — Telehealth: Payer: Self-pay | Admitting: Internal Medicine

## 2020-04-29 MED ORDER — LOSARTAN POTASSIUM 100 MG PO TABS: 100.0000 mg | ORAL_TABLET | Freq: Every day | ORAL | 3 refills | Status: DC

## 2020-04-29 MED ORDER — BISOPROLOL FUMARATE 5 MG PO TABS: 2.5000 mg | ORAL_TABLET | Freq: Every day | ORAL | 3 refills | Status: DC

## 2020-04-29 MED ORDER — FUROSEMIDE 40 MG PO TABS: 40.0000 mg | ORAL_TABLET | Freq: Every day | ORAL | 3 refills | Status: DC

## 2020-04-29 MED ORDER — OMEPRAZOLE 20 MG PO CPDR: DELAYED_RELEASE_CAPSULE | ORAL | 3 refills | Status: DC

## 2020-04-29 MED ORDER — POTASSIUM CHLORIDE CRYS ER 20 MEQ PO TBCR: 20.0000 meq | EXTENDED_RELEASE_TABLET | Freq: Every day | ORAL | 3 refills | Status: DC

## 2020-04-29 MED ORDER — AMIODARONE HCL 200 MG PO TABS: 200.0000 mg | ORAL_TABLET | Freq: Every day | ORAL | 3 refills | Status: DC

## 2020-04-29 MED ORDER — ALBUTEROL SULFATE HFA 108 (90 BASE) MCG/ACT IN AERS: 2.0000 | INHALATION_SPRAY | RESPIRATORY_TRACT | 3 refills | Status: AC | PRN

## 2020-04-29 MED ORDER — BREZTRI AEROSPHERE 160-9-4.8 MCG/ACT IN AERO: 2.0000 | INHALATION_SPRAY | Freq: Two times a day (BID) | RESPIRATORY_TRACT | 3 refills | Status: DC

## 2020-04-29 MED ORDER — SPIRONOLACTONE 25 MG PO TABS: 25.0000 mg | ORAL_TABLET | Freq: Every day | ORAL | 3 refills | Status: DC

## 2020-04-29 NOTE — Telephone Encounter (Signed)
Breztri, omeprazole, and ventolin all sent in this afternoon to [facility]  Pt aware  Nothing further needed

## 2020-09-01 ENCOUNTER — Telehealth: Payer: Self-pay | Admitting: Internal Medicine

## 2020-09-01 NOTE — Telephone Encounter (Signed)
Primary Pulmonologist: Dr. Melvyn Novas Last office visit and with whom: 11/24/19 What do we see them for (pulmonary problems): COPD Last OV assessment/plan:  Instructions  Plan A = Automatic = Always=    Breztri Take 2 puffs first thing in am and then another 2 puffs about 12 hours later.     Plan B = Backup (to supplement plan A, not to replace it) Only use your albuterol inhaler as a rescue medication to be used if you can't catch your breath by resting or doing a relaxed purse lip breathing pattern.  - The less you use it, the better it will work when you need it. - Ok to use the inhaler up to 2 puffs  every 4 hours if you must but call for appointment if use goes up over your usual need - Don't leave home without it !!  (think of it like the spare tire for your car)   Plan C = Crisis (instead of Plan B but only if Plan B stops working) - only use your albuterol nebulizer if you first try Plan B and it fails to help > ok to use the nebulizer up to every 4 hours but if start needing it regularly call for immediate appointment  Try bed blocks 6-8 in under head board and try taking prilosec Take 30- 60 min before your first and last meals of the day x one month to see if problem with gasping for breath p lying down is better    I very strongly recommend you get the moderna or pfizer vaccine as soon as possible based on your risk of dying from the virus  and the proven safety and benefit of these vaccines against even the delta variant.  This can save your life as well as  those of your loved ones,  especially if they are also not vaccinated.      Please schedule a follow up visit in 12  months but call sooner if needed  - if you like the breztri but can't afford can make appt to see our pharmacist  - needs hrct due to cxr findings plus esr/bnp vs priors (both nl 08/26/19)          Reason for call:  Called and spoke with Patient Patient stated Sunday his neighbor used an Pharmacist, hospital and the fumes bled through the walls to his apartment.  Patient stated he has been checking his sats and they have remained around 95%.  Patient stated he has experienced increased sob, with short distance ambulation. Patient stated he has a type of oxygen concentrator he has been using, but could not me what the liter setting was. Patient stated he thought the concentrator would flush out the inhaled fumes and decrease his sob.   Patient stated he has been using his Breztri, 2 puffs, twice daily.  Patient stated he has used his Ventolin a few times, but was advised not to use his Ventolin often, because of his A fib.   Patient requested to come in office for a breathing treatment to see if that would clear his lungs.  Per LOV instructions, Patient should have nebs, but stated he doesn't.   Message routed to Dr. Melvyn Novas to advise    Allergies  Allergen Reactions  . Heparin Anaphylaxis and Hives    Per patient   . Codeine Itching    Whelps      . Other     Seasonal allergies, "all spices, salt,  pepper"    Immunization History  Administered Date(s) Administered  . Influenza-Unspecified 02/06/2017  . Pneumococcal Polysaccharide-23 02/22/2017

## 2020-09-01 NOTE — Telephone Encounter (Signed)
Spoke with pt and reviewed Plan C with him. Pt was also scheduled for OV with Ames Dura on Friday 10/04/20 so medications were not ordered. Pt stated understanding of both and plan and appointment. Nothing further needed at this time.  Routing to [employer] as Lorain Childes

## 2020-09-01 NOTE — Telephone Encounter (Signed)
Ok to schedule with NP or me if I have an opening this week  If neither rec Prednisone 10 mg take  4 each am x 2 days,   2 each am x 2 days,  1 each am x 2 days and stop   Needs plan C per last OV  (that's why I tried to make ) sure he had the full action plan, if not now, he will need in future

## 2020-09-02 NOTE — Progress Notes (Signed)
@Patient  ID: , male    DOB: 1950-09-08, 70 y.o.   MRN: [id number]  Chief Complaint  Patient presents with  . Follow-up    Increased SOB    Referring provider: No ref. provider found  HPI: 70 year old male, former smoker quit in 2017 (100 pack year hx). PMH significant for COPD GOLD II, alpha 1 antitrypsin deficiency carrier, upper airway cough syndrome, allergic rhinitis, paroxysmal afib, systolic heart failure, left bundle branch block. Patient of Dr. 2018, last seen in office on 11/24/19.   Previous [facility] pulmonary encounter: 11/24/2019  f/u ov/Wert re:  COPD II  COPD / MZ/ still on amiodarone      Chief Complaint  Patient presents with  . Follow-up    go over pft results  Dyspnea:  Walks across parking lot/ does 11/26/2019 but feels he's losing ground Cough: variable all day  Sleeping awakens  gasping for breath every night x one year / bed is flat  SABA use:  3-4 x per day  02: none   No obvious day to day or daytime variability or assoc excess/ purulent sputum or mucus plugs or hemoptysis or cp or chest tightness, subjective wheeze or overt sinus or hb symptoms.   Also denies any obvious fluctuation of symptoms with weather or environmental changes or other aggravating or alleviating factors except as outlined above   No unusual exposure hx or h/o childhood pna/ asthma or knowledge of premature birth.   09/03/2020- Interim hx  Patient presents today for acute visit. He called our office on 09/01/20 for breathing treatment. He was exposed to some heavy duty 09/03/20 5 days ago.  He developed burning to his eyes and shortness of breath soon after.  Associated wheezing and slight cough. He becomes winded walking more than 50-100 ft. He is compliant with Breztri two puffs twice daily. Using his ventolin 4 times a day during acute exacerbation.   Dyspnea: Dyspnea with 181ft  Cough: Productive cough with clear mucus  Sleeping: 1 pillow; He may have  cough/sob around 4am, uses Albuterol rescue inhaler  SABA: Currently using Albuterol 4x/day d.t exacerbation (twice a day on normal days) O2 : None    Allergies  Allergen Reactions  . Heparin Anaphylaxis and Hives    Per patient   . Codeine Itching    Whelps      . Other     Seasonal allergies, "all spices, salt, pepper"    Immunization History  Administered Date(s) Administered  . Influenza-Unspecified 02/06/2017  . Pneumococcal Polysaccharide-23 02/22/2017    Past Medical History:  Diagnosis Date  . Allergic rhinitis   . Allergy    seasonal allergies  . Arthritis   . Atrial fibrillation (HCC) 03/2017  . Black widow spider bite   . CHF (congestive heart failure) (HCC)   . Chronic systolic heart failure (HCC) 09/16/2015   Possibly tachycardia mediated // Echo 12/18: EF 20-25, trivial AI, mild RAE, PASP 35  . COPD (chronic obstructive pulmonary disease) (HCC)   . Hypertension   . Stroke (cerebrum) (HCC)   . Tobacco use    x40 years. 1 ppd    Tobacco History: Social History   Tobacco Use  Smoking Status Former Smoker  . Packs/day: 2.00  . Years: 50.00  . Pack years: 100.00  . Types: Cigarettes  . Start date: 04/14/1963  . Quit date: 10/09/2015  . Years since quitting: 4.9  Smokeless Tobacco Never Used  Tobacco Comment   stopped  for a couple of years in his 30s   Counseling given: Not Answered Comment: stopped for a couple of years in his 30s   Outpatient Medications Prior to Visit  Medication Sig Dispense Refill  . acetaminophen (TYLENOL) 325 MG tablet Take 650 mg by mouth every 6 (six) hours as needed for mild pain or fever.    Marland Kitchen albuterol (VENTOLIN HFA) 108 (90 Base) MCG/ACT inhaler Inhale 2 puffs into the lungs every 4 (four) hours as needed for wheezing or shortness of breath. 54 g 3  . amiodarone (PACERONE) 200 MG tablet Take 1 tablet (200 mg total) by mouth daily. 90 tablet 3  . bisoprolol (ZEBETA) 5 MG tablet Take 0.5 tablets (2.5 mg total) by mouth  daily. 45 tablet 3  . Budeson-Glycopyrrol-Formoterol (BREZTRI AEROSPHERE) 160-9-4.8 MCG/ACT AERO Inhale 2 puffs into the lungs in the morning and at bedtime. 4 g 0  . Budeson-Glycopyrrol-Formoterol (BREZTRI AEROSPHERE) 160-9-4.8 MCG/ACT AERO Inhale 2 puffs into the lungs 2 (two) times daily. 32.1 g 3  . cetirizine (ZYRTEC) 10 MG tablet Take 10 mg by mouth daily.    . furosemide (LASIX) 40 MG tablet Take 1 tablet (40 mg total) by mouth daily. 90 tablet 3  . losartan (COZAAR) 100 MG tablet Take 1 tablet (100 mg total) by mouth daily. 90 tablet 3  . omeprazole (PRILOSEC) 20 MG capsule Take 30- 60 min before your first and last meals of the day 180 capsule 3  . potassium chloride SA (KLOR-CON) 20 MEQ tablet Take 1 tablet (20 mEq total) by mouth daily. 90 tablet 3  . spironolactone (ALDACTONE) 25 MG tablet Take 1 tablet (25 mg total) by mouth daily. 90 tablet 3  . benzonatate (TESSALON) 200 MG capsule Take 1 capsule (200 mg total) by mouth 3 (three) times daily as needed for cough. (Patient not taking: Reported on 09/03/2020) 60 capsule 1  . budesonide-formoterol (SYMBICORT) 160-4.5 MCG/ACT inhaler Inhale 2 puffs into the lungs in the morning and at bedtime. (Patient not taking: Reported on 09/03/2020) 1 each 11   No facility-administered medications prior to visit.    Review of Systems  Review of Systems  Constitutional: Negative.   HENT: Negative.   Respiratory: Positive for cough, chest tightness and wheezing.    Physical Exam  BP 122/74 (BP Location: Right Arm, Cuff Size: Normal)   Pulse 61   Temp (!) 97.4 F (36.3 C) (Temporal)   Ht 6\' 1"  (1.854 m)   Wt 206 lb 6.4 oz (93.6 kg)   SpO2 92% Comment: RA  BMI 27.23 kg/m  Physical Exam Constitutional:      Appearance: Normal appearance.  HENT:     Mouth/Throat:     Mouth: Mucous membranes are moist.     Pharynx: Oropharynx is clear.  Cardiovascular:     Rate and Rhythm: Normal rate and regular rhythm.  Pulmonary:     Effort:  Pulmonary effort is normal.     Breath sounds: Normal breath sounds.  Neurological:     General: No focal deficit present.     Mental Status: He is alert and oriented to person, place, and time. Mental status is at baseline.  Psychiatric:        Mood and Affect: Mood normal.        Behavior: Behavior normal.        Thought Content: Thought content normal.        Judgment: Judgment normal.      Lab Results:  CBC  Component Value Date/Time   WBC 11.2 (H) 08/26/2019 1116   RBC 5.30 08/26/2019 1116   HGB 16.5 08/26/2019 1116   HGB 13.0 09/30/2015 0935   HCT 51.0 08/26/2019 1116   HCT 39.0 09/30/2015 0935   PLT 271 08/26/2019 1116   PLT 322 09/30/2015 0935   MCV 96.2 08/26/2019 1116   MCV 87 09/30/2015 0935   MCH 31.1 08/26/2019 1116   MCHC 32.4 08/26/2019 1116   RDW 12.6 08/26/2019 1116   RDW 15.6 (H) 09/30/2015 0935   LYMPHSABS 1.4 11/26/2017 0939   MONOABS 0.9 11/26/2017 0939   EOSABS 0.4 11/26/2017 0939   BASOSABS 0.2 (H) 11/26/2017 0939    BMET    Component Value Date/Time   NA 137 08/26/2019 1116   NA 134 09/30/2015 0935   K 4.7 08/26/2019 1116   CL 102 08/26/2019 1116   CO2 23 08/26/2019 1116   GLUCOSE 66 (L) 08/26/2019 1116   BUN 14 08/26/2019 1116   BUN 17 09/30/2015 0935   CREATININE 1.31 (H) 08/26/2019 1116   CALCIUM 9.6 08/26/2019 1116   GFRNONAA 56 (L) 08/26/2019 1116   GFRAA >60 08/26/2019 1116    BNP    Component Value Date/Time   BNP 76.1 08/26/2019 1115    ProBNP No results found for: PROBNP  Imaging: No results found.   Assessment & Plan:   COPD with acute exacerbation (HCC) - Acute exacerbation d/t chemical exposure 5 days ago. Using SABA 4/day. Afebrile.  - He has wheezing t/o with rhonchi right base - Continue Breztri Aerosphere two puffs twice daily (patient in donut hole, given him samples today to help offset cost)  - Checking CXR today - Sending in prednsone taper 40mg  x 2 days, 30mg  x 2 days, 20mg , x 2 days, 10mg  x 2  days  - FU due in August with Dr. , NP 09/03/2020

## 2020-09-03 ENCOUNTER — Encounter: Payer: Self-pay | Admitting: Primary Care

## 2020-09-03 ENCOUNTER — Other Ambulatory Visit: Payer: Self-pay

## 2020-09-03 ENCOUNTER — Encounter: Payer: Self-pay | Admitting: *Deleted

## 2020-09-03 ENCOUNTER — Ambulatory Visit (INDEPENDENT_AMBULATORY_CARE_PROVIDER_SITE_OTHER): Payer: Medicare HMO

## 2020-09-03 ENCOUNTER — Ambulatory Visit (INDEPENDENT_AMBULATORY_CARE_PROVIDER_SITE_OTHER): Payer: Medicare HMO | Admitting: Primary Care

## 2020-09-03 VITALS — BP 122/74 | HR 61 | Temp 97.4°F | Ht 73.0 in | Wt 206.4 lb

## 2020-09-03 DIAGNOSIS — J449 Chronic obstructive pulmonary disease, unspecified: Secondary | ICD-10-CM | POA: Diagnosis not present

## 2020-09-03 DIAGNOSIS — J441 Chronic obstructive pulmonary disease with (acute) exacerbation: Secondary | ICD-10-CM | POA: Diagnosis not present

## 2020-09-03 MED ORDER — PREDNISONE 10 MG PO TABS: ORAL_TABLET | ORAL | 0 refills | Status: DC

## 2020-09-03 MED ORDER — BREZTRI AEROSPHERE 160-9-4.8 MCG/ACT IN AERO
2.0000 | INHALATION_SPRAY | Freq: Two times a day (BID) | RESPIRATORY_TRACT | 0 refills | Status: DC
Start: 2020-09-03 — End: 2020-10-22

## 2020-09-03 NOTE — Progress Notes (Signed)
CXR showed no acute disease. Pulmonary hyperexpansion and bronchial thickening consistent with COPD. No consolidation, pneumothorax or effusion.

## 2020-09-03 NOTE — Patient Instructions (Addendum)
   Recommendations: - Continue Breztri two puffs morning and evening (rinse mouth after use) * Call insurance and ask if Symbicort, Advair hfa or Dulera are covered________________  AND if Spiriva Respimat is covered ________________  Rx: - Prednisone taper as directed   Follow-up: - Due for follow-up in August with Dr. Sherene Sires

## 2020-09-03 NOTE — Assessment & Plan Note (Signed)
-   Acute exacerbation d/t chemical exposure 5 days ago. Using SABA 4/day. Afebrile.  - He has wheezing t/o with rhonchi right base - Continue Breztri Aerosphere two puffs twice daily (patient in donut hole, given him samples today to help offset cost)  - Checking CXR today - Sending in prednsone taper 40mg x 2 days, 30mg x 2 days, 20mg, x 2 days, 10mg x 2 days  - FU due in August with Dr. Wert  

## 2020-09-03 NOTE — Assessment & Plan Note (Deleted)
-   Acute exacerbation d/t chemical exposure 5 days ago. Using SABA 4/day. Afebrile.  - He has wheezing t/o with rhonchi right base - Continue Breztri Aerosphere two puffs twice daily (patient in donut hole, given him samples today to help offset cost)  - Checking CXR today - Sending in prednsone taper 40mg  x 2 days, 30mg  x 2 days, 20mg , x 2 days, 10mg  x 2 days  - FU due in August with Dr. 

## 2020-10-22 ENCOUNTER — Ambulatory Visit (HOSPITAL_COMMUNITY)
Admission: RE | Admit: 2020-10-22 | Discharge: 2020-10-22 | Disposition: A | Discharge status: Home / Self Care | Payer: Medicare HMO | Source: Ambulatory Visit | Attending: Internal Medicine | Admitting: Internal Medicine

## 2020-10-22 ENCOUNTER — Other Ambulatory Visit (HOSPITAL_COMMUNITY): Payer: Self-pay

## 2020-10-22 ENCOUNTER — Other Ambulatory Visit: Payer: Self-pay

## 2020-10-22 VITALS — BP 114/62 | HR 55 | Ht 73.0 in | Wt 201.8 lb

## 2020-10-22 DIAGNOSIS — R000 Tachycardia, unspecified: Secondary | ICD-10-CM | POA: Insufficient documentation

## 2020-10-22 DIAGNOSIS — Z79899 Other long term (current) drug therapy: Secondary | ICD-10-CM | POA: Diagnosis not present

## 2020-10-22 DIAGNOSIS — I447 Left bundle-branch block, unspecified: Secondary | ICD-10-CM | POA: Insufficient documentation

## 2020-10-22 DIAGNOSIS — Z8249 Family history of ischemic heart disease and other diseases of the circulatory system: Secondary | ICD-10-CM | POA: Diagnosis not present

## 2020-10-22 DIAGNOSIS — I5022 Chronic systolic (congestive) heart failure: Secondary | ICD-10-CM | POA: Insufficient documentation

## 2020-10-22 DIAGNOSIS — Z87891 Personal history of nicotine dependence: Secondary | ICD-10-CM | POA: Insufficient documentation

## 2020-10-22 DIAGNOSIS — Z7901 Long term (current) use of anticoagulants: Secondary | ICD-10-CM | POA: Diagnosis not present

## 2020-10-22 DIAGNOSIS — Z7952 Long term (current) use of systemic steroids: Secondary | ICD-10-CM | POA: Insufficient documentation

## 2020-10-22 DIAGNOSIS — I480 Paroxysmal atrial fibrillation: Secondary | ICD-10-CM | POA: Diagnosis not present

## 2020-10-22 DIAGNOSIS — I110 Hypertensive heart disease with heart failure: Secondary | ICD-10-CM | POA: Diagnosis present

## 2020-10-22 DIAGNOSIS — J449 Chronic obstructive pulmonary disease, unspecified: Secondary | ICD-10-CM | POA: Diagnosis not present

## 2020-10-22 LAB — T4, FREE: Free T4: 1.33 ng/dL — ABNORMAL HIGH (ref 0.61–1.12)

## 2020-10-22 LAB — CBC
HCT: 51.7 % (ref 39.0–52.0)
Hemoglobin: 16.7 g/dL (ref 13.0–17.0)
MCH: 30.4 pg (ref 26.0–34.0)
MCHC: 32.3 g/dL (ref 30.0–36.0)
MCV: 94.2 fL (ref 80.0–100.0)
Platelets: 225 10*3/uL (ref 150–400)
RBC: 5.49 MIL/uL (ref 4.22–5.81)
RDW: 13.2 % (ref 11.5–15.5)
WBC: 10 10*3/uL (ref 4.0–10.5)
nRBC: 0 % (ref 0.0–0.2)

## 2020-10-22 LAB — COMPREHENSIVE METABOLIC PANEL
ALT: 19 U/L (ref 0–44)
AST: 19 U/L (ref 15–41)
Albumin: 4.4 g/dL (ref 3.5–5.0)
Alkaline Phosphatase: 90 U/L (ref 38–126)
Anion gap: 10 (ref 5–15)
BUN: 17 mg/dL (ref 8–23)
CO2: 27 mmol/L (ref 22–32)
Calcium: 9.7 mg/dL (ref 8.9–10.3)
Chloride: 100 mmol/L (ref 98–111)
Creatinine, Ser: 1.33 mg/dL — ABNORMAL HIGH (ref 0.61–1.24)
GFR, Estimated: 58 mL/min — ABNORMAL LOW (ref >60)
Glucose, Bld: 103 mg/dL — ABNORMAL HIGH (ref 70–99)
Potassium: 4.4 mmol/L (ref 3.5–5.1)
Sodium: 137 mmol/L (ref 135–145)
Total Bilirubin: 0.6 mg/dL (ref 0.3–1.2)
Total Protein: 7.7 g/dL (ref 6.5–8.1)

## 2020-10-22 LAB — TSH: TSH: 1.403 u[IU]/mL (ref 0.350–4.500)

## 2020-10-22 MED ORDER — AMIODARONE HCL 100 MG PO TABS: 100.0000 mg | ORAL_TABLET | Freq: Every day | ORAL | 3 refills | Status: DC

## 2020-10-22 NOTE — Patient Instructions (Signed)
DECREASE Amiodarone to 100 mg, one tab daily  Labs today We will only contact you if something comes back abnormal or we need to make some changes. Otherwise no news is good news!  Your physician recommends that you schedule a follow-up appointment in: 6 months with Dr Gala Romney and echo  Your physician has requested that you have an echocardiogram. Echocardiography is a painless test that uses sound waves to create images of your heart. It provides your doctor with information about the size and shape of your heart and how well your heart's chambers and valves are working. This procedure takes approximately one hour. There are no restrictions for this procedure.  Do the following things EVERYDAY: Weigh yourself in the morning before breakfast. Write it down and keep it in a log. Take your medicines as prescribed Eat low salt foods--Limit salt (sodium) to 2000 mg per day.  Stay as active as you can everyday Limit all fluids for the day to less than 2 liters  milAt the [facility], you and your health needs are our priority. As part of our continuing mission to provide you with exceptional heart care, we have created designated Provider Care Teams. These Care Teams include your primary Cardiologist (physician) and Advanced Practice Providers (APPs- Physician Assistants and Nurse Practitioners) who all work together to provide you with the care you need, when you need it.   You may see any of the following providers on your designated Care Team at your next follow up: Dr Arvilla Meres Dr Marca Ancona Dr Brandon Melnick, NP Robbie Lis, [place] Mikki Santee Karle Plumber, PharmD   Please be sure to bring in all your medications bottles to every appointment.   If you have any questions or concerns before your next appointment please send [name] a message through [place] or call our office at [phone].    TO LEAVE A MESSAGE FOR THE NURSE SELECT OPTION  2, PLEASE LEAVE A MESSAGE INCLUDING: YOUR NAME DATE OF BIRTH CALL BACK NUMBER REASON FOR CALL**this is important as we prioritize the call backs  YOU WILL RECEIVE A CALL BACK THE SAME DAY AS LONG AS YOU CALL BEFORE 4:00 PM

## 2020-10-22 NOTE — Progress Notes (Signed)
Advanced Heart Failure Clinic Note   Patient ID: Reginald Hahn, male   DOB: 03-Jun-1950, 70 y.o.   MRN: [id number] Primary Cardiologist: Dr. Gala Romney   HPI: [name] "Reginald Hahn" is a 70 y.o. male with h/o COPD admitted in 6/17 with acute systolic HF, AF with RVR and cardiogenic shock. LBBB  Patient was admitted 6/17 with acute systolic HF and new onset afib with RVR and EF found to be 15%. Felt to to have viral CM exacerbated by afib. Diuresed slowly so PICC placed and co-ox low. Started on IV amio and milrinone and subsequently diuresed well. Was pending cath and TEE/DC-CV but developed massive RP bleed and was transferred to ICU.   All anti-coagulants held. Received multiple transfusions. Subsequently recovered. During that time also had loss of peripheral vision in R eye. Head CT negative. Pre discharge had Myoview with EF 30-44%.  Inferior infarct vs gut attenuation. No ischemia.   Pulmonary Clinic with Dr. Jamison Neighbor and told he has severe COPD FEV1 1.83 (49%) FEF 25-75% 0.60L  DLCO 42%.   Pt admitted from 12/17 -> 04/10/17 with complicated admission consisting of AF with RVR, low output CHF, and URI. Pt transiently required milrinone.  After long discussion pt agreed to be placed on Eliquis, so was then candidate for DCCV with TEE. Pt failed DCCV 04/05/18 initially, but then converted to NSR overnight on IV amidoarone. Pt continued to go in-and-out of Afib throughout his admission. Decision made to send home on continued Amiodarone. Tolerated milrinone wean.   EF 30-35% in 3/19. Echo 8/19 30-35%   Last visit about one year ago, doing fairly well, COPD stable. Exercise capacity stable and doing ADLs without problem.   Here for follow up visit.  Has some orthostasis when getting up quikcly occasionally otherwise feeling well.  Feels his breathing is at baseline (has copd) had an exacerbation last month which resolved.  He currently is weighing daily 204 at home and fluid has been stable.   BP twice daily at home is about 120-130 systolic and 70-80 diastolic.  No longer having chest pains although this morning had some that lasted for a second, felt like a pulled muscle.  Describes an episode of poor recall last weekend when a family member asked him a question, gave no answer and family was concerned.    Myoview 6/17 There was no ST segment deviation noted during stress. The left ventricular ejection fraction is moderately decreased (30-44%). Findings consistent with prior myocardial infarction. This is an intermediate risk study. nferior/inferolateral defect (moderate) that does not improve in the rest images consistent with scar and possible soft tissue attenuation. No significant ischemia.  RHC/LHC 08/29/2016 RA = 6 RV = 40/8 PA = 40/13 (24) PCW = 19 Ao = 104/82 (93) LV = 141/17 Fick cardiac output/index = 4.8/2.3 PVR = 1.0 WU SVR 1437  Ao sat = 90% PA sat = 62%, 63%  Assessment: 1. Minimal non-obstructive CAD 2. Severe nonischemic CM with EF 25% by cath (EF likely underestimated on re-look likely 40%) 3. Relatively well compensated filling pressures with mild pulmonary venous HTN   Echo 8/17 EF 45% Echo 2/18 EF 45-50% Echo 12/18 EF 10-15% Echo 2/19 EF 25-30% Echo 3/19 EF 30-35% Echo 8/19 EF 40-45% (my read)   Review of systems complete and found to be negative unless listed in HPI.    Past Medical History:  Diagnosis Date   Allergic rhinitis    Allergy    seasonal allergies   Arthritis  Atrial fibrillation (HCC) 03/2017   Black widow spider bite    CHF (congestive heart failure) (HCC)    Chronic systolic heart failure (HCC) 09/16/2015   Possibly tachycardia mediated // Echo 12/18: EF 20-25, trivial AI, mild RAE, PASP 35   COPD (chronic obstructive pulmonary disease) (HCC)    Hypertension    Stroke (cerebrum) (HCC)    Tobacco use    x40 years. 1 ppd   SH:  Social History   Socioeconomic History   Marital status: Divorced    Spouse name: Not  on file   Number of children: Not on file   Years of education: Not on file   Highest education level: Not on file  Occupational History   Not on file  Tobacco Use   Smoking status: Former    Packs/day: 2.00    Years: 50.00    Pack years: 100.00    Types: Cigarettes    Start date: 04/14/1963    Quit date: 10/09/2015    Years since quitting: 5.0   Smokeless tobacco: Never   Tobacco comments:    stopped for a couple of years in his 30s  Vaping Use   Vaping Use: Never used  Substance and Sexual Activity   Alcohol use: No    Alcohol/week: 0.0 standard drinks   Drug use: No    Comment: Used to smoke marijuana   Sexual activity: Not on file  Other Topics Concern   Not on file  Social History Narrative   Lives alone   2 daughters, ex-wife lives in [place]   Owns a plumbing company   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. 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 No pets currently. Remote exposure to a parrot for 2 years.    Social Determinants of Health   Financial Resource Strain: Not on file  Food Insecurity: Not on file  Transportation Needs: Not on file  Physical Activity: Not on file  Stress: Not on file  Social Connections: Not on file  Intimate Partner Violence: Not on file    FH:  Family History  Problem Relation Age of Onset   Heart disease Mother        died in her 62's.   Diabetes Mother    Prostate cancer Father        died in his 72's.   Congenital heart disease Brother        died @ age 98.   Lung disease Neg Hx     Past Medical History:  Diagnosis Date   Allergic rhinitis    Allergy    seasonal allergies   Arthritis    Atrial fibrillation (HCC) 03/2017   Black widow spider bite    CHF (congestive heart failure) (HCC)    Chronic systolic heart failure (HCC) 09/16/2015   Possibly tachycardia mediated // Echo 12/18: EF 20-25, trivial AI,  mild RAE, PASP 35   COPD (chronic obstructive pulmonary disease) (HCC)    Hypertension    Stroke (cerebrum) (HCC)    Tobacco use    x40 years. 1 ppd    Current Outpatient Medications  Medication Sig Dispense Refill   acetaminophen (TYLENOL) 325 MG tablet Take 650 mg by mouth every 6 (six) hours as needed for mild pain or fever.     albuterol (VENTOLIN HFA) 108 (90 Base) MCG/ACT inhaler Inhale 2 puffs into the  lungs every 4 (four) hours as needed for wheezing or shortness of breath. 54 g 3   amiodarone (PACERONE) 200 MG tablet Take 1 tablet (200 mg total) by mouth daily. 90 tablet 3   bisoprolol (ZEBETA) 5 MG tablet Take 0.5 tablets (2.5 mg total) by mouth daily. 45 tablet 3   Budeson-Glycopyrrol-Formoterol (BREZTRI AEROSPHERE) 160-9-4.8 MCG/ACT AERO Inhale 2 puffs into the lungs 2 (two) times daily. 32.1 g 3   cetirizine (ZYRTEC) 10 MG tablet Take 10 mg by mouth daily.     furosemide (LASIX) 40 MG tablet Take 1 tablet (40 mg total) by mouth daily. 90 tablet 3   losartan (COZAAR) 100 MG tablet Take 1 tablet (100 mg total) by mouth daily. 90 tablet 3   omeprazole (PRILOSEC) 20 MG capsule Take 30- 60 min before your first and last meals of the day 180 capsule 3   potassium chloride SA (KLOR-CON) 20 MEQ tablet Take 1 tablet (20 mEq total) by mouth daily. 90 tablet 3   predniSONE (DELTASONE) 10 MG tablet Take 4 tabs po daily x 2 days; then 3 tabs for 2 days; then 2 tabs for 2 days; then 1 tab for 2 days 20 tablet 0   spironolactone (ALDACTONE) 25 MG tablet Take 1 tablet (25 mg total) by mouth daily. 90 tablet 3   benzonatate (TESSALON) 200 MG capsule Take 1 capsule (200 mg total) by mouth 3 (three) times daily as needed for cough. (Patient not taking: Reported on 10/22/2020) 60 capsule 1   No current facility-administered medications for this encounter.   Vitals:   10/22/20 1132  BP: 114/62  Pulse: (!) 55  SpO2: 92%  Weight: 91.5 kg (201 lb 12.8 oz)  Height: 6\' 1"  (1.854 m)   Wt Readings  from Last 3 Encounters:  10/22/20 91.5 kg (201 lb 12.8 oz)  09/03/20 93.6 kg (206 lb 6.4 oz)  11/24/19 97.1 kg (214 lb)    PHYSICAL EXAM: General:  Well appearing. No resp difficulty HEENT: normal Neck: supple. no JVD. Carotids 2+ bilat; no bruits. No lymphadenopathy or thryomegaly appreciated. Cor: PMI nondisplaced. Regular rate & rhythm. No rubs, gallops or murmurs. Lungs: clear with decreased BS throughout  Abdomen: soft, nontender, nondistended. No hepatosplenomegaly. No bruits or masses. Good bowel sounds. Extremities: no cyanosis, clubbing, rash, edema Neuro: alert & orientedx3, cranial nerves grossly intact. moves all 4 extremities w/o difficulty. Affect pleasant  ECG: NSR 54, 1AVB 260 ms LBBB 128 ms Personally reviewed   ASSESSMENT & PLAN:  1. Chronic Systolic HF due to tachy-induced CM in the setting of recurrent AF in setting of viral illness - Echo 03/28/2017 LVEF ~20%, worsened from Echo 2/18 EF 45-50% in setting of Afib RVR.  - Echo 06/26/17 LVEF 30-35%, Grade 1 DD, trivial AI - Echo 8/19 EF 40-45% with LBBB  - Stable NYHA II-III from COPD mostly  - Volume status stable on exam.  - Continue lasix 40 mg daily  - Continue spiro 25 mg daily.  - Continue losartan to 100 mg daily. He has refused Entresto with cost (unable to mitigate due to high co-pay -> $135 per month)  - Continue bisoprolol 2.5 - Doing well with Na/fluid restriction and daily weights/bp's  - will repeat echo.   2. PAF  - Remains in NSR - Continue amiodarone 200 mg daily - Discussed with patient restarting AC.  He would like to discuss this with his family +/- receive further workup to risk stratify him for CVA - Previously referred  to EP to discuss possible AF ablation as he has had 2 life-threatening episodes of cardiogenic shock in setting of PAF but he has deferred. He is aware at long-term risks of amiodarone- check amio labs   3. COPD - Sees Dr. Sherene Sires - Continue Breztri  - No longer  smoking  4. H/o Transient loss of R peripheral vision, memory disturbances - last visit transient vision loss, this visit describes another episode where he did not respond to his family which he downplays a bit - Discussed again at length today.  Pt declines Neuro referral, MRI, and/or carotid dopplers. No change. - Also had discussion on restarting eliquis as above  5. HTN - Blood pressure well controlled. Continue current regimen.  6. LBBB - Stable on recent EKG. QRS ~128 ms  Angelita Ingles, MD  11:45 AM    Patient seen and examined with the above-signed Advanced Practice Provider and/or Housestaff. I personally reviewed laboratory data, imaging studies and relevant notes. I independently examined the patient and formulated the important aspects of the plan. I have edited the note to reflect any of my changes or salient points. I have personally discussed the plan with the patient and/or family.  Overall doing fairly well. NYHA II> Volume status ok. Remains in NSR on amio 200 daily. Has had several episodes where he forgot where he was and PC concerned for TIA but he refused further w/u. Has refused anticoagulation due to previous severe RP bleed in hospital on heparin. No palpitations. Last echo 8/19 EF 40-45%  General:  Well appearing. No resp difficulty HEENT: normal Neck: supple. no JVD. Carotids 2+ bilat; no bruits. No lymphadenopathy or thryomegaly appreciated. Cor: PMI nondisplaced. Regular rate & rhythm. No rubs, gallops or murmurs. Lungs: clear Abdomen: soft, nontender, nondistended. No hepatosplenomegaly. No bruits or masses. Good bowel sounds. Extremities: no cyanosis, clubbing, rash, edema Neuro: alert & orientedx3, cranial nerves grossly intact. moves all 4 extremities w/o difficulty. Affect pleasant  Overall doing well. Discussed risks/indications of AC.as well as increased risk of tZIA/CVA without AC in setting of PAF. We discussed possibility of further risk  stratification with a Zio patch to look for silent AF and/or brain MRI/MRA to look for evidence of previous embolic insults. He wants to discuss further with his family before deciding. In the meantime, decrease amio to 100 bid and repeat echo.  Labs today.   Total time spent 40 minutes. Over half that time spent discussing above.   Arvilla Meres, MD  11:16 PM

## 2020-10-23 LAB — T3, FREE: T3, Free: 3 pg/mL (ref 2.0–4.4)

## 2020-10-25 ENCOUNTER — Telehealth (HOSPITAL_COMMUNITY): Payer: Self-pay | Admitting: Pharmacy Technician

## 2020-10-25 ENCOUNTER — Other Ambulatory Visit (HOSPITAL_COMMUNITY): Payer: Self-pay | Admitting: *Deleted

## 2020-10-25 ENCOUNTER — Other Ambulatory Visit (HOSPITAL_COMMUNITY): Payer: Self-pay

## 2020-10-25 NOTE — Telephone Encounter (Signed)
Advanced Heart Failure Patient Advocate Encounter  Patient called and left a message inquiring about the anticoagulant that his provider suggested.   When the patient was in the office, there was some discussion about restarting Eliquis. When I went into the room to discuss co-pay and possible assistance the patient was not interested in restarting.   A benefits investigation showed 30 days of Eliquis would be $141.59. The patient is in the donut hole. Discussed the possibility of assistance if he has met the 3% OOP required by Medicare recipients. That would be about $630 for him. The patient stated that most meds are covered or not much cost outside of the inhalers so he does not think he has met that. Patient stated his daughters were going to look into [facility] to see if the medication would be cheaper there.  They did not have Eliquis or Breztri on their current list, but Ventolin (generic) was. Informed patient it would be about $43.  Patient stated he would wait for the results of the brain scan before proceeding. Will be here to assist in the future as needed.

## 2020-10-26 ENCOUNTER — Telehealth (HOSPITAL_COMMUNITY): Payer: Self-pay | Admitting: *Deleted

## 2020-10-26 NOTE — Telephone Encounter (Signed)
Pt left vm requesting a call to schedule MRI of brain and zio monitor. I called pt back no answer/left msg for return call.

## 2020-11-30 ENCOUNTER — Telehealth (HOSPITAL_COMMUNITY): Payer: Self-pay | Admitting: *Deleted

## 2020-11-30 NOTE — Telephone Encounter (Signed)
Last labs and office visit note mailed to pts home.

## 2020-12-01 ENCOUNTER — Ambulatory Visit: Payer: Medicare HMO | Admitting: Internal Medicine

## 2020-12-08 ENCOUNTER — Ambulatory Visit: Payer: Medicare HMO | Admitting: Primary Care

## 2020-12-09 ENCOUNTER — Other Ambulatory Visit: Payer: Self-pay | Admitting: Internal Medicine

## 2021-02-20 ENCOUNTER — Other Ambulatory Visit (HOSPITAL_COMMUNITY): Payer: Self-pay | Admitting: Internal Medicine

## 2021-03-09 ENCOUNTER — Other Ambulatory Visit (HOSPITAL_COMMUNITY): Payer: Self-pay | Admitting: Internal Medicine

## 2021-05-09 ENCOUNTER — Other Ambulatory Visit (HOSPITAL_COMMUNITY): Payer: Self-pay | Admitting: Internal Medicine

## 2021-05-25 ENCOUNTER — Other Ambulatory Visit: Payer: Self-pay

## 2021-05-25 ENCOUNTER — Ambulatory Visit (HOSPITAL_COMMUNITY)
Admission: RE | Admit: 2021-05-25 | Discharge: 2021-05-25 | Disposition: A | Discharge status: Home / Self Care | Payer: Medicare HMO | Source: Ambulatory Visit | Attending: Internal Medicine | Admitting: Internal Medicine

## 2021-05-25 ENCOUNTER — Encounter (HOSPITAL_COMMUNITY): Payer: Self-pay | Admitting: Internal Medicine

## 2021-05-25 VITALS — BP 130/70 | HR 62 | Wt 200.0 lb

## 2021-05-25 DIAGNOSIS — I110 Hypertensive heart disease with heart failure: Secondary | ICD-10-CM | POA: Insufficient documentation

## 2021-05-25 DIAGNOSIS — I428 Other cardiomyopathies: Secondary | ICD-10-CM | POA: Diagnosis not present

## 2021-05-25 DIAGNOSIS — I272 Pulmonary hypertension, unspecified: Secondary | ICD-10-CM | POA: Insufficient documentation

## 2021-05-25 DIAGNOSIS — H53121 Transient visual loss, right eye: Secondary | ICD-10-CM | POA: Insufficient documentation

## 2021-05-25 DIAGNOSIS — I447 Left bundle-branch block, unspecified: Secondary | ICD-10-CM | POA: Insufficient documentation

## 2021-05-25 DIAGNOSIS — J449 Chronic obstructive pulmonary disease, unspecified: Secondary | ICD-10-CM | POA: Insufficient documentation

## 2021-05-25 DIAGNOSIS — R000 Tachycardia, unspecified: Secondary | ICD-10-CM | POA: Diagnosis not present

## 2021-05-25 DIAGNOSIS — I5022 Chronic systolic (congestive) heart failure: Secondary | ICD-10-CM | POA: Diagnosis not present

## 2021-05-25 DIAGNOSIS — Z7901 Long term (current) use of anticoagulants: Secondary | ICD-10-CM | POA: Insufficient documentation

## 2021-05-25 DIAGNOSIS — Z79899 Other long term (current) drug therapy: Secondary | ICD-10-CM | POA: Diagnosis not present

## 2021-05-25 DIAGNOSIS — I480 Paroxysmal atrial fibrillation: Secondary | ICD-10-CM | POA: Insufficient documentation

## 2021-05-25 DIAGNOSIS — I482 Chronic atrial fibrillation, unspecified: Secondary | ICD-10-CM | POA: Diagnosis not present

## 2021-05-25 DIAGNOSIS — Z7182 Exercise counseling: Secondary | ICD-10-CM | POA: Insufficient documentation

## 2021-05-25 DIAGNOSIS — R001 Bradycardia, unspecified: Secondary | ICD-10-CM | POA: Insufficient documentation

## 2021-05-25 DIAGNOSIS — I2510 Atherosclerotic heart disease of native coronary artery without angina pectoris: Secondary | ICD-10-CM | POA: Diagnosis not present

## 2021-05-25 DIAGNOSIS — I10 Essential (primary) hypertension: Secondary | ICD-10-CM | POA: Diagnosis not present

## 2021-05-25 DIAGNOSIS — I440 Atrioventricular block, first degree: Secondary | ICD-10-CM | POA: Insufficient documentation

## 2021-05-25 DIAGNOSIS — R570 Cardiogenic shock: Secondary | ICD-10-CM | POA: Diagnosis not present

## 2021-05-25 DIAGNOSIS — Z87891 Personal history of nicotine dependence: Secondary | ICD-10-CM | POA: Insufficient documentation

## 2021-05-25 DIAGNOSIS — Z8673 Personal history of transient ischemic attack (TIA), and cerebral infarction without residual deficits: Secondary | ICD-10-CM | POA: Insufficient documentation

## 2021-05-25 LAB — COMPREHENSIVE METABOLIC PANEL
ALT: 18 U/L (ref 0–44)
AST: 22 U/L (ref 15–41)
Albumin: 4 g/dL (ref 3.5–5.0)
Alkaline Phosphatase: 78 U/L (ref 38–126)
Anion gap: 10 (ref 5–15)
BUN: 17 mg/dL (ref 8–23)
CO2: 23 mmol/L (ref 22–32)
Calcium: 9.4 mg/dL (ref 8.9–10.3)
Chloride: 102 mmol/L (ref 98–111)
Creatinine, Ser: 1.27 mg/dL — ABNORMAL HIGH (ref 0.61–1.24)
GFR, Estimated: >60 mL/min (ref >60)
Glucose, Bld: 108 mg/dL — ABNORMAL HIGH (ref 70–99)
Potassium: 4.2 mmol/L (ref 3.5–5.1)
Sodium: 135 mmol/L (ref 135–145)
Total Bilirubin: 0.7 mg/dL (ref 0.3–1.2)
Total Protein: 6.8 g/dL (ref 6.5–8.1)

## 2021-05-25 LAB — CBC
HCT: 52.1 % — ABNORMAL HIGH (ref 39.0–52.0)
Hemoglobin: 16.8 g/dL (ref 13.0–17.0)
MCH: 30 pg (ref 26.0–34.0)
MCHC: 32.2 g/dL (ref 30.0–36.0)
MCV: 93 fL (ref 80.0–100.0)
Platelets: 205 K/uL (ref 150–400)
RBC: 5.6 MIL/uL (ref 4.22–5.81)
RDW: 13.2 % (ref 11.5–15.5)
WBC: 10.1 K/uL (ref 4.0–10.5)
nRBC: 0 % (ref 0.0–0.2)

## 2021-05-25 LAB — TSH: TSH: 1.314 u[IU]/mL (ref 0.350–4.500)

## 2021-05-25 LAB — T4, FREE: Free T4: 0.96 ng/dL (ref 0.61–1.12)

## 2021-05-25 LAB — BRAIN NATRIURETIC PEPTIDE: B Natriuretic Peptide: 22.7 pg/mL (ref 0.0–100.0)

## 2021-05-25 NOTE — Progress Notes (Signed)
Advanced Heart Failure Clinic Note   Patient ID: Reginald Hahn, male   DOB: 06-26-1950, 71 y.o.   MRN: [id number] Primary Cardiologist: Dr. Haroldine Hahn   HPI: [name] "Reginald Hahn" is a 71 y.o. male with h/o COPD admitted in 6/17 with acute systolic HF, AF with RVR and cardiogenic shock. LBBB  Patient was admitted 6/17 with acute systolic HF and new onset afib with RVR and EF found to be 15%. Felt to to have viral CM exacerbated by afib. Diuresed slowly so PICC placed and co-ox low. Started on IV amio and milrinone and subsequently diuresed well. Was pending cath and TEE/DC-CV but developed massive RP bleed and was transferred to ICU.   All anti-coagulants held. Received multiple transfusions. Subsequently recovered. During that time also had loss of peripheral vision in R eye. Head CT negative. Pre discharge had Myoview with EF 30-44%.  Inferior infarct vs gut attenuation. No ischemia.   Pulmonary Clinic with Dr. Ashok Hahn and told he has severe COPD FEV1 1.83 (49%) FEF 25-75% 0.60L  DLCO 42%.   Pt admitted from 12/17 -> [id number] with complicated admission consisting of AF with RVR, low output CHF, and URI. Pt transiently required milrinone.  After long discussion pt agreed to be placed on Eliquis, so was then candidate for DCCV with TEE. Pt failed DCCV 04/05/18 initially, but then converted to NSR overnight on IV amidoarone. Pt continued to go in-and-out of Afib throughout his admission. Decision made to send home on continued Amiodarone. Tolerated milrinone wean.   EF 30-35% in 3/19. Echo 8/19 30-35%   Here for follow up visit.  At last visit, amio decreased to 100 daily. F/u echo ordered but not performed. Feels pretty good. Walks or rides stationary bike for 30 mins every day. Breathing is OK. Limited by COPD. No palpitations or CP.  No further stroke-like symptoms.   Myoview 6/17 There was no ST segment deviation noted during stress. The left ventricular ejection fraction is moderately  decreased (30-44%). Findings consistent with prior myocardial infarction. This is an intermediate risk study. nferior/inferolateral defect (moderate) that does not improve in the rest images consistent with scar and possible soft tissue attenuation. No significant ischemia.  RHC/LHC 08/29/2016 RA = 6 RV = 40/8 PA = 40/13 (24) PCW = 19 Ao = 104/82 (93) LV = 141/17 Fick cardiac output/index = 4.8/2.3 PVR = 1.0 WU SVR 1437  Ao sat = 90% PA sat = 62%, 63%  Assessment: 1. Minimal non-obstructive CAD 2. Severe nonischemic CM with EF 25% by cath (EF likely underestimated on re-look likely 40%) 3. Relatively well compensated filling pressures with mild pulmonary venous HTN   Echo 8/17 EF 45% Echo 2/18 EF 45-50% Echo 12/18 EF 10-15% Echo 2/19 EF 25-30% Echo 3/19 EF 30-35% Echo 8/19 EF 40-45% (my read)   Review of systems complete and found to be negative unless listed in HPI.    Past Medical History:  Diagnosis Date   Allergic rhinitis    Allergy    seasonal allergies   Arthritis    Atrial fibrillation ([place]) 03/2017   Black widow spider bite    CHF (congestive heart failure) ([place])    Chronic systolic heart failure ([place]) 09/16/2015   Possibly tachycardia mediated // Echo 12/18: EF 20-25, trivial AI, mild RAE, PASP 35   COPD (chronic obstructive pulmonary disease) (HCC)    Hypertension    Stroke (cerebrum) (HCC)    Tobacco use    x40 years. 1 ppd   SH:  Social  History   Socioeconomic History   Marital status: Divorced    Spouse name: Not on file   Number of children: Not on file   Years of education: Not on file   Highest education level: Not on file  Occupational History   Not on file  Tobacco Use   Smoking status: Former    Packs/day: 2.00    Years: 50.00    Pack years: 100.00    Types: Cigarettes    Start date: 04/14/1963    Quit date: 10/09/2015    Years since quitting: 5.6   Smokeless tobacco: Never   Tobacco comments:    stopped for a couple of years in his  45s  Vaping Use   Vaping Use: Never used  Substance and Sexual Activity   Alcohol use: No    Alcohol/week: 0.0 standard drinks   Drug use: No    Comment: Used to smoke marijuana   Sexual activity: Not on file  Other Topics Concern   Not on file  Social History Narrative   Lives alone   2 daughters, ex-wife lives in [place]   Owns a [employer]   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 Does have asbestos and mold exposure. No pets currently. Remote exposure to a parrot for 2 years.    Social Determinants of Health   Financial Resource Strain: Not on file  Food Insecurity: Not on file  Transportation Needs: Not on file  Physical Activity: Not on file  Stress: Not on file  Social Connections: Not on file  Intimate Partner Violence: Not on file    FH:  Family History  Problem Relation Age of Onset   Heart disease Mother        died in her 83's.   Diabetes Mother    Prostate cancer Father        died in his 94's.   Congenital heart disease Brother        died @ age 80.   Lung disease Neg Hx     Past Medical History:  Diagnosis Date   Allergic rhinitis    Allergy    seasonal allergies   Arthritis    Atrial fibrillation ([place]) 03/2017   Black widow spider bite    CHF (congestive heart failure) (HCC)    Chronic systolic heart failure ([place]) 09/16/2015   Possibly tachycardia mediated // Echo 12/18: EF 20-25, trivial AI, mild RAE, PASP 35   COPD (chronic obstructive pulmonary disease) (HCC)    Hypertension    Stroke (cerebrum) (HCC)    Tobacco use    x40 years. 1 ppd    Current Outpatient Medications  Medication Sig Dispense Refill   acetaminophen (TYLENOL) 325 MG tablet Take 650 mg by mouth every 6 (six) hours as needed for mild pain or fever.     albuterol (VENTOLIN HFA) 108 (90 Base) MCG/ACT inhaler Inhale 2 puffs into the lungs every 4 (four)  hours as needed for wheezing or shortness of breath. 54 g 3   amiodarone (PACERONE) 100 MG tablet Take 1 tablet (100 mg total) by mouth daily. 90 tablet 3   benzonatate (TESSALON) 100 MG capsule Take 100 mg by mouth as needed for cough.     bisoprolol (ZEBETA) 5 MG tablet Take 0.5 tablets (2.5 mg total) by mouth daily. PLEASE CALL OFFICE ON [phone] TO MAKE FOLLOW UP  APPOINTMENT 45 tablet 0   budesonide-formoterol (SYMBICORT) 80-4.5 MCG/ACT inhaler Inhale 2 puffs into the lungs 2 (two) times daily.     cetirizine (ZYRTEC) 10 MG tablet Take 10 mg by mouth daily.     furosemide (LASIX) 40 MG tablet TAKE 1 TABLET EVERY DAY 90 tablet 3   losartan (COZAAR) 100 MG tablet TAKE 1 TABLET EVERY DAY 90 tablet 3   omeprazole (PRILOSEC) 20 MG capsule TAKE 1 CAPSULE 30 TO 60 MINUTES BEFORE YOUR FIRST AND LAST MEALS OF THE DAY 180 capsule 0   potassium chloride SA (KLOR-CON M) 20 MEQ tablet TAKE 1 TABLET EVERY DAY 90 tablet 3   spironolactone (ALDACTONE) 25 MG tablet TAKE 1 TABLET EVERY DAY 90 tablet 3   No current facility-administered medications for this encounter.   Vitals:   05/25/21 1426  BP: 130/70  Pulse: 62  SpO2: 92%  Weight: 90.7 kg (200 lb)   Wt Readings from Last 3 Encounters:  05/25/21 90.7 kg (200 lb)  10/22/20 91.5 kg (201 lb 12.8 oz)  09/03/20 93.6 kg (206 lb 6.4 oz)    PHYSICAL EXAM: General:  Well appearing. No resp difficulty HEENT: normal Neck: supple. no JVD. Carotids 2+ bilat; no bruits. No lymphadenopathy or thryomegaly appreciated. Cor: PMI nondisplaced. Regular rate & rhythm. No rubs, gallops or murmurs. Lungs: clear decreased throughout  Abdomen: soft, nontender, nondistended. No hepatosplenomegaly. No bruits or masses. Good bowel sounds. Extremities: no cyanosis, clubbing, rash, edema Neuro: alert & orientedx3, cranial nerves grossly intact. moves all 4 extremities w/o difficulty. Affect pleasant   ASSESSMENT & PLAN:  1. Chronic Systolic HF due to tachy-induced  CM in the setting of recurrent AF in setting of viral illness - Echo 03/28/2017 LVEF ~20%, worsened from Echo 2/18 EF 45-50% in setting of Afib RVR.  - Echo 06/26/17 LVEF 30-35%, Grade 1 DD, trivial AI - Echo 8/19 EF 40-45% with LBBB  - Stable NYHA II from COPD mostly. Encouraged to continue his exercise program. - Volume status stable on exam.  - Continue lasix 40 mg daily  - Continue spiro 25 mg daily.  - Continue losartan to 100 mg daily. He has refused Entresto with cost (unable to mitigate due to high co-pay -> $135 per month)  - Continue bisoprolol 2.5 - If EF still down on echo will discuss SGLT2i again - he would like to hold off unless absolutely necessary - Repeat echo - Check labs  2. PAF  - Remains in NSR - Continue amiodarone 100 mg daily - Discussed with patient restarting AC.  He would like to discuss this with his family +/- receive further workup to risk stratify him for CVA - Previously referred to EP to discuss possible AF ablation as he has had 2 life-threatening episodes of cardiogenic shock in setting of PAF but he has deferred.  - Refuses AC due to previous RP hemorrhage with heparin  3. COPD - Sees Dr. Melvyn Novas - Continue Breztri  - No longer smoking  4. H/o Transient loss of R peripheral vision, memory disturbances - last visit transient vision loss in 2022, this visit describes another episode where he did not respond to his family which he downplays a bit - No recurrence. Has not wanted further w/u  5. HTN - Blood pressure well controlled. Continue current regimen.  6. LBBB - Stable   Glori Bickers, MD  3:04 PM

## 2021-05-25 NOTE — Addendum Note (Signed)
Encounter addended by: Linda Hedges, RN on: 05/25/2021 3:24 PM  Actions taken: Order list changed, Diagnosis association updated, Charge Capture section accepted, Clinical Note Signed

## 2021-05-25 NOTE — Patient Instructions (Signed)
Thank you for your visit today.  There has been no changes to your medications.  Labs done today, your results will be available in MyChart, we will contact you for abnormal readings.  Your physician has requested that you have an echocardiogram. Echocardiography is a painless test that uses sound waves to create images of your heart. It provides your doctor with information about the size and shape of your heart and how well your hearts chambers and valves are working. This procedure takes approximately one hour. There are no restrictions for this procedure.  Your physician recommends that you schedule a follow-up appointment in: 9 months (September 2023)  **please call the office in August to arrange your appointment**  If you have any questions or concerns before your next appointment please send [name] a message through [place] or call our office at [phone].    TO LEAVE A MESSAGE FOR THE NURSE SELECT OPTION 2, PLEASE LEAVE A MESSAGE INCLUDING: YOUR NAME DATE OF BIRTH CALL BACK NUMBER REASON FOR CALL**this is important as we prioritize the call backs  YOU WILL RECEIVE A CALL BACK THE SAME DAY AS LONG AS YOU CALL BEFORE 4:00 PM  At the [facility], you and your health needs are our priority. As part of our continuing mission to provide you with exceptional heart care, we have created designated Provider Care Teams. These Care Teams include your primary Cardiologist (physician) and Advanced Practice Providers (APPs- Physician Assistants and Nurse Practitioners) who all work together to provide you with the care you need, when you need it.   You may see any of the following providers on your designated Care Team at your next follow up: Dr Arvilla Meres Dr Carron Curie, NP Robbie Lis, [place] [facility] [place], [place] Karle Plumber, PharmD   Please be sure to bring in all your medications bottles to every appointment.

## 2021-05-26 LAB — T3, FREE: T3, Free: 2.6 pg/mL (ref 2.0–4.4)

## 2021-06-16 ENCOUNTER — Ambulatory Visit (HOSPITAL_COMMUNITY): Payer: Medicare HMO

## 2021-06-28 ENCOUNTER — Other Ambulatory Visit: Payer: Self-pay

## 2021-06-28 ENCOUNTER — Ambulatory Visit (HOSPITAL_COMMUNITY)
Admission: RE | Admit: 2021-06-28 | Discharge: 2021-06-28 | Disposition: A | Discharge status: Home / Self Care | Payer: Medicare HMO | Source: Ambulatory Visit | Attending: Internal Medicine | Admitting: Internal Medicine

## 2021-06-28 DIAGNOSIS — I5022 Chronic systolic (congestive) heart failure: Secondary | ICD-10-CM | POA: Insufficient documentation

## 2021-06-28 DIAGNOSIS — J449 Chronic obstructive pulmonary disease, unspecified: Secondary | ICD-10-CM | POA: Diagnosis not present

## 2021-06-28 DIAGNOSIS — I110 Hypertensive heart disease with heart failure: Secondary | ICD-10-CM | POA: Insufficient documentation

## 2021-06-28 DIAGNOSIS — I4891 Unspecified atrial fibrillation: Secondary | ICD-10-CM | POA: Diagnosis not present

## 2021-06-28 DIAGNOSIS — Z8673 Personal history of transient ischemic attack (TIA), and cerebral infarction without residual deficits: Secondary | ICD-10-CM | POA: Diagnosis not present

## 2021-06-28 DIAGNOSIS — F17200 Nicotine dependence, unspecified, uncomplicated: Secondary | ICD-10-CM | POA: Insufficient documentation

## 2021-06-28 LAB — ECHOCARDIOGRAM COMPLETE
Area-P 1/2: 3.53 cm2
Calc EF: 63.6 %
S' Lateral: 3.7 cm
Single Plane A2C EF: 64.4 %
Single Plane A4C EF: 61.7 %

## 2021-06-28 NOTE — Progress Notes (Signed)
?  Echocardiogram ?2D Echocardiogram has been performed. ? ?Augustine Radar ?06/28/2021, 9:58 AM ?

## 2021-07-02 ENCOUNTER — Other Ambulatory Visit: Payer: Self-pay | Admitting: Internal Medicine

## 2021-08-02 ENCOUNTER — Ambulatory Visit: Payer: Medicare HMO | Admitting: Internal Medicine

## 2021-08-08 ENCOUNTER — Ambulatory Visit: Payer: Medicare HMO | Admitting: Internal Medicine

## 2021-09-08 ENCOUNTER — Telehealth (HOSPITAL_COMMUNITY): Payer: Self-pay | Admitting: *Deleted

## 2021-09-08 NOTE — Telephone Encounter (Signed)
Echo results mailed to patients home per pts request.

## 2021-10-10 ENCOUNTER — Other Ambulatory Visit (HOSPITAL_COMMUNITY): Payer: Self-pay | Admitting: Internal Medicine

## 2021-11-28 ENCOUNTER — Telehealth (HOSPITAL_COMMUNITY): Payer: Self-pay | Admitting: *Deleted

## 2021-11-28 ENCOUNTER — Other Ambulatory Visit (HOSPITAL_COMMUNITY): Payer: Self-pay | Admitting: *Deleted

## 2021-11-28 MED ORDER — BISOPROLOL FUMARATE 5 MG PO TABS: ORAL_TABLET | ORAL | 0 refills | Status: DC

## 2021-11-28 NOTE — Telephone Encounter (Signed)
Echo results and last office note mailed to patient as requested.

## 2022-01-30 ENCOUNTER — Encounter: Payer: Self-pay | Admitting: Adult Health

## 2022-01-30 ENCOUNTER — Ambulatory Visit: Payer: Medicare HMO | Admitting: Adult Health

## 2022-01-30 DIAGNOSIS — J441 Chronic obstructive pulmonary disease with (acute) exacerbation: Secondary | ICD-10-CM | POA: Diagnosis not present

## 2022-01-30 NOTE — Patient Instructions (Addendum)
Continue on Symbicort 2 puffs Twice daily , rinse after use  Activity as tolerated  Albuterol inhaler As needed   Follow up with Dr. Melvyn Novas  in 6 months and As needed   Please contact office for sooner follow up if symptoms do not improve or worsen or seek emergency care

## 2022-01-30 NOTE — Progress Notes (Signed)
@Patient  ID: Reginald Hahn, male    DOB: [birth year], 71 y.o.   MRN: [id number]  Chief Complaint  Patient presents with   Acute Visit    Referring provider: No ref. provider found  HPI: 71 year old male former smoker (100-pack-year history) followed for moderate COPD, alpha 1 MZ phenotype Medical history significant for congestive heart failure and A-fib  TEST/EVENTS :  PFT 06/15/16: FVC 4.05 L (92%) FEV1 1.33 L (49%) FEV1/FVC 0.45 and dlco corrected 45%   Alpha one 08/01/16:   Level 99/ MZ  - Spirometry 05/25/2017  FEV1 2.09 (57%)  Ratio 50 with classic curvature and no rx prior  -  05/25/2017  Walked RA x 3 laps @ 185 ft each stopped due to  End of study, nl pace, no sob or desat  - 12/26/2018  After extensive coaching inhaler device,  effectiveness =    90%  - PFT's  11/24/2019  FEV1 1.87 (52 % ) ratio 0.54  p 0 % improvement from saba p 0 prior to study with DLCO  15.13 (54%) corrects to 2.30 (56%)  for alv volume and FV curve mild curve  Echo 06/2021 EF 40%, gr 1 DD, nml PAP   01/30/2022 Acute OV : COPD  Patient presents for an acute office visit.  Last seen May 2022.  Patient complains of 3 weeks of increased cough, congestion.  Seen by primary care provider and given Augmentin last week.   Symptoms are somewhat improved  but does have some intermittent cough and congestion.  During the initial flare patient did have some exertional desaturations, body aches and low grade fever.   O2 saturations are 93% on room air today walking to the exam room.  Patient remains on Symbicort 80 twice daily. Tested for Covid at home several times , all negative.  Feels he is turning the corner and is feeling better.  No chest pain, hemoptysis, edema, n/v/d.  Walk test in office showed no desaturations <88-90% on room air .  Declines flu shot and COVID vaccines   Allergies  Allergen Reactions   Heparin Anaphylaxis and Hives    Per patient    Codeine Itching    [place]       Other     Seasonal  allergies, "all spices, salt, pepper"    Immunization History  Administered Date(s) Administered   Influenza-Unspecified 02/06/2017   Pneumococcal Polysaccharide-23 02/22/2017    Past Medical History:  Diagnosis Date   Allergic rhinitis    Allergy    seasonal allergies   Arthritis    Atrial fibrillation ([place]) 03/2017   Black widow spider bite    CHF (congestive heart failure) (HCC)    Chronic systolic heart failure ([place]) 09/16/2015   Possibly tachycardia mediated // Echo 12/18: EF 20-25, trivial AI, mild RAE, PASP 35   COPD (chronic obstructive pulmonary disease) (HCC)    Hypertension    Stroke (cerebrum) (HCC)    Tobacco use    x40 years. 1 ppd    Tobacco History: Social History   Tobacco Use  Smoking Status Former   Packs/day: 2.00   Years: 50.00   Total pack years: 100.00   Types: Cigarettes   Start date: 04/14/1963   Quit date: 10/09/2015   Years since quitting: 6.3  Smokeless Tobacco Never  Tobacco Comments   stopped for a couple of years in his 35s   Counseling given: Not Answered Tobacco comments: stopped for a couple of years in his 2s   Outpatient  Medications Prior to Visit  Medication Sig Dispense Refill   acetaminophen (TYLENOL) 325 MG tablet Take 650 mg by mouth every 6 (six) hours as needed for mild pain or fever.     albuterol (VENTOLIN HFA) 108 (90 Base) MCG/ACT inhaler Inhale 2 puffs into the lungs every 4 (four) hours as needed for wheezing or shortness of breath. 54 g 3   amiodarone (PACERONE) 100 MG tablet Take 1 tablet (100 mg total) by mouth daily. 90 tablet 3   bisoprolol (ZEBETA) 5 MG tablet TAKE 1/2 TABLET EVERY DAY 45 tablet 0   budesonide-formoterol (SYMBICORT) 80-4.5 MCG/ACT inhaler Inhale 2 puffs into the lungs 2 (two) times daily.     cetirizine (ZYRTEC) 10 MG tablet Take 10 mg by mouth daily.     furosemide (LASIX) 40 MG tablet TAKE 1 TABLET EVERY DAY 90 tablet 3   losartan (COZAAR) 100 MG tablet TAKE 1 TABLET EVERY DAY 90 tablet 3    omeprazole (PRILOSEC) 20 MG capsule TAKE 1 CAPSULE 30 TO 60 MINUTES BEFORE YOUR FIRST AND LAST MEALS OF THE DAY 180 capsule 0   potassium chloride SA (KLOR-CON M) 20 MEQ tablet TAKE 1 TABLET EVERY DAY 90 tablet 3   spironolactone (ALDACTONE) 25 MG tablet TAKE 1 TABLET EVERY DAY 90 tablet 3   benzonatate (TESSALON) 100 MG capsule Take 100 mg by mouth as needed for cough. (Patient not taking: Reported on 01/30/2022)     No facility-administered medications prior to visit.     Review of Systems:   Constitutional:   No  weight loss, night sweats,  Fevers, chills,  +fatigue, or  lassitude.  HEENT:   No headaches,  Difficulty swallowing,  Tooth/dental problems, or  Sore throat,                No sneezing, itching, ear ache, nasal congestion, post nasal drip,   CV:  No chest pain,  Orthopnea, PND, swelling in lower extremities, anasarca, dizziness, palpitations, syncope.   GI  No heartburn, indigestion, abdominal pain, nausea, vomiting, diarrhea, change in bowel habits, loss of appetite, bloody stools.   Resp:   No chest wall deformity  Skin: no rash or lesions.  GU: no dysuria, change in color of urine, no urgency or frequency.  No flank pain, no hematuria   MS:  No joint pain or swelling.  No decreased range of motion.  No back pain.    Physical Exam  BP (!) 102/56 (BP Location: Left Arm, Patient Position: Sitting, Cuff Size: Normal)   Pulse 74   Temp 98.2 F (36.8 C) (Oral)   Ht 6\' 1"  (1.854 m)   Wt 181 lb 6.4 oz (82.3 kg)   SpO2 93%   BMI 23.93 kg/m   GEN: A/Ox3; pleasant , NAD, well nourished    HEENT:  [place]/AT,   NOSE-clear, THROAT-clear, no lesions, no postnasal drip or exudate noted.   NECK:  Supple w/ fair ROM; no JVD; normal carotid impulses w/o bruits; no thyromegaly or nodules palpated; no lymphadenopathy.    RESP  Clear  P & A; w/o, wheezes/ rales/ or rhonchi. no accessory muscle use, no dullness to percussion  CARD:  RRR, no m/r/g, no peripheral edema, pulses  intact, no cyanosis or clubbing.  GI:   Soft & nt; nml bowel sounds; no organomegaly or masses detected.   Musco: Warm bil, no deformities or joint swelling noted.   Neuro: alert, no focal deficits noted.    Skin: Warm, no lesions or  rashes    Lab Results:  CBC   BMET    ProBNP No results found for: "PROBNP"  Imaging: No results found.       Latest Ref Rng & Units 11/24/2019    8:59 AM 02/22/2017   10:27 AM 10/31/2016    2:49 PM 06/15/2016    1:52 PM  PFT Results  FVC-Pre L 3.44  3.46  3.34  4.05   FVC-Predicted Pre % 71  70  67  82   FVC-Post L 3.69  4.07  4.06  4.75   FVC-Predicted Post % 76  82  82  96   Pre FEV1/FVC % % 54  53  52  45   Post FEV1/FCV % % 48  55  58  40   FEV1-Pre L 1.87  1.84  1.73  1.83   FEV1-Predicted Pre % 52  50  46  49   FEV1-Post L 1.78  2.25  2.36  1.88   DLCO uncorrected ml/min/mmHg 15.13    15.26   DLCO UNC% % 54    43   DLCO corrected ml/min/mmHg 15.13    14.79   DLCO COR %Predicted % 54    42   DLVA Predicted % 56    47   TLC L 6.60    8.22   TLC % Predicted % 88    110   RV % Predicted % 105    159     No results found for: "NITRICOXIDE"      Assessment & Plan:   COPD with acute exacerbation (HCC) Improving COPD exacerbation.  Hold on additional antibiotics this time.  Recommended chest x-ray patient declines at today's visit.  We will try to recheck on follow-up visit If going forward has recurrent flares will consider changing Symbicort to [facility].  Plan  Patient Instructions  Continue on Symbicort 2 puffs Twice daily , rinse after use  Activity as tolerated  Albuterol inhaler As needed   Follow up with Dr. Melvyn Novas  in 6 months and As needed   Please contact office for sooner follow up if symptoms do not improve or worsen or seek emergency care         Rexene Edison, NP 01/30/2022

## 2022-01-30 NOTE — Assessment & Plan Note (Signed)
Improving COPD exacerbation.  Hold on additional antibiotics this time.  Recommended chest x-ray patient declines at today's visit.  We will try to recheck on follow-up visit If going forward has recurrent flares will consider changing Symbicort to [facility].  Plan  Patient Instructions  Continue on Symbicort 2 puffs Twice daily , rinse after use  Activity as tolerated  Albuterol inhaler As needed   Follow up with Dr. Melvyn Novas  in 6 months and As needed   Please contact office for sooner follow up if symptoms do not improve or worsen or seek emergency care

## 2022-02-22 ENCOUNTER — Other Ambulatory Visit (HOSPITAL_COMMUNITY): Payer: Self-pay | Admitting: Internal Medicine

## 2022-02-26 ENCOUNTER — Other Ambulatory Visit (HOSPITAL_COMMUNITY): Payer: Self-pay | Admitting: Internal Medicine

## 2022-03-17 ENCOUNTER — Other Ambulatory Visit (HOSPITAL_COMMUNITY): Payer: Self-pay | Admitting: Internal Medicine

## 2022-05-06 ENCOUNTER — Other Ambulatory Visit (HOSPITAL_COMMUNITY): Payer: Self-pay | Admitting: Internal Medicine

## 2022-05-12 ENCOUNTER — Other Ambulatory Visit (HOSPITAL_COMMUNITY): Payer: Self-pay | Admitting: Internal Medicine

## 2022-07-20 ENCOUNTER — Other Ambulatory Visit (HOSPITAL_COMMUNITY): Payer: Self-pay | Admitting: Internal Medicine

## 2022-12-20 ENCOUNTER — Other Ambulatory Visit (HOSPITAL_COMMUNITY): Payer: Self-pay | Admitting: Internal Medicine

## 2023-03-10 ENCOUNTER — Other Ambulatory Visit (HOSPITAL_COMMUNITY): Payer: Self-pay | Admitting: Internal Medicine

## 2023-04-25 LAB — COLOGUARD: COLOGUARD: NEGATIVE

## 2023-04-25 LAB — EXTERNAL GENERIC LAB PROCEDURE: COLOGUARD: NEGATIVE

## 2023-06-25 ENCOUNTER — Encounter: Payer: Self-pay | Admitting: Internal Medicine

## 2023-07-23 NOTE — Progress Notes (Signed)
 [facility] Note   Primary Cardiologist: Dr. Julane Ny   Reason for Visit: Heart Failure Follow-up HPI: [name] "Reginald Hahn" is a 73 y.o. male with h/o COPD, HFrEF, LBBB,  AF with RVR and cardiogenic shock, and LBBB.  EF in 2017 ~15% in the setting of new AF. CGS s/p Milrinone. Was scheduled for cath and TEE-DCCV, however developed massive RP bleed.  During that time also had loss of peripheral vision in R eye. Head CT negative. Pre-discharge had Myoview with EF 30-44%.  Inferior infarct vs gut attenuation. No ischemia.   Pulmonary Clinic with Dr. Lyndal Sandy and told he has severe COPD FEV1 1.83 (49%) FEF 25-75% 0.60L  DLCO 42%.   Admitted again in 2019 with CGS in AF RVR requiring Milrinone. Converted to NSR on IV amio. EF 30-35%  Has been lost to follow up since February of 2023. EF was approx 40% with marked septal dyssynchrony, G1DD, nlRV  He returns today for heart failure follow up. Overall feeling well. NYHA II, limited mostly by COPD. Reports occasional dyspnea. Denies chest pain, fatigue, near-syncope, orthopnea, palpitations, dizziness, and abnormal bleeding. Able to perform ADLs. Appetite okay. Weight at home stable. BP at home 120/60s. O2 sat will drop every now and then, he checks daily and uses his inhalers. Compliant with all medications.  Cardiac Studies: Echo 8/17 EF 45% Echo 2/18 EF 45-50% Echo 12/18 EF 10-15% Echo 2/19 EF 25-30% Echo 3/19 EF 30-35% Echo 8/19 EF 40-45% Echo 3/23 EF 40%  Review of systems complete and found to be negative unless listed in HPI.    Past Medical History:  Diagnosis Date   Allergic rhinitis    Allergy    seasonal allergies   Arthritis    Atrial fibrillation (HCC) 03/2017   Black widow spider bite    CHF (congestive heart failure) (HCC)    Chronic systolic heart failure (HCC) 09/16/2015   Possibly tachycardia mediated // Echo 12/18: EF 20-25, trivial AI, mild RAE, PASP 35   COPD (chronic obstructive  pulmonary disease) (HCC)    Hypertension    Stroke (cerebrum) (HCC)    Tobacco use    x40 years. 1 ppd   SH:  Social History   Socioeconomic History   Marital status: Divorced    Spouse name: Not on file   Number of children: Not on file   Years of education: Not on file   Highest education level: Not on file  Occupational History   Not on file  Tobacco Use   Smoking status: Former    Current packs/day: 0.00    Average packs/day: 2.0 packs/day for 52.5 years (105.0 ttl pk-yrs)    Types: Cigarettes    Start date: 04/14/1963    Quit date: 10/09/2015    Years since quitting: 7.7   Smokeless tobacco: Never   Tobacco comments:    stopped for a couple of years in his 30s  Vaping Use   Vaping status: Never Used  Substance and Sexual Activity   Alcohol use: No    Alcohol/week: 0.0 standard drinks of alcohol   Drug use: No    Comment: Used to smoke marijuana   Sexual activity: Not on file  Other Topics Concern   Not on file  Social History Narrative   Lives alone   2 daughters, ex-wife lives in [place]   Owns a plumbing company   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 No pets currently. Remote exposure to a parrot for 2 years.    Social Drivers of Health   Financial Resource Strain: Medium Risk (06/05/2017)   Overall Financial Resource Strain (CARDIA)    Difficulty of Paying Living Expenses: Somewhat hard  Food Insecurity: Not on file  Transportation Needs: Not on file  Physical Activity: Inactive (06/05/2017)   Exercise Vital Sign    Days of Exercise per Week: 0 days    Minutes of Exercise per Session: 0 min  Stress: Not on file  Social Connections: Not on file  Intimate Partner Violence: Not on file    FH:  Family History  Problem Relation Age of Onset   Heart disease Mother        died in her 27's.   Diabetes  Mother    Prostate cancer Father        died in his 47's.   Congenital heart disease Brother        died @ age 46.   Lung disease Neg Hx     Past Medical History:  Diagnosis Date   Allergic rhinitis    Allergy    seasonal allergies   Arthritis    Atrial fibrillation (HCC) 03/2017   Black widow spider bite    CHF (congestive heart failure) (HCC)    Chronic systolic heart failure (HCC) 09/16/2015   Possibly tachycardia mediated // Echo 12/18: EF 20-25, trivial AI, mild RAE, PASP 35   COPD (chronic obstructive pulmonary disease) (HCC)    Hypertension    Stroke (cerebrum) (HCC)    Tobacco use    x40 years. 1 ppd    Current Outpatient Medications  Medication Sig Dispense Refill   acetaminophen (TYLENOL) 325 MG tablet Take 650 mg by mouth every 6 (six) hours as needed for mild pain or fever.     albuterol (VENTOLIN HFA) 108 (90 Base) MCG/ACT inhaler Inhale 2 puffs into the lungs every 4 (four) hours as needed for wheezing or shortness of breath. 54 g 3   amiodarone (PACERONE) 100 MG tablet Take 1 tablet (100 mg total) by mouth daily. 90 tablet 3   amiodarone (PACERONE) 200 MG tablet TAKE 1 TABLET EVERY DAY 90 tablet 3   bisoprolol (ZEBETA) 5 MG tablet TAKE 1/2 TABLET EVERY DAY (CALL [phone] FOR FOLLOW UP APPOINTMENT) 45 tablet 3   budesonide-formoterol (SYMBICORT) 80-4.5 MCG/ACT inhaler Inhale 2 puffs into the lungs 2 (two) times daily.     cetirizine (ZYRTEC) 10 MG tablet Take 10 mg by mouth daily.     furosemide (LASIX) 40 MG tablet TAKE 1 TABLET EVERY DAY (NEED MD APPOINTMENT) 90 tablet 0   losartan (COZAAR) 100 MG tablet TAKE 1 TABLET EVERY DAY (NEED MD APPOINTMENT) 90 tablet 0   omeprazole (PRILOSEC) 20 MG capsule TAKE 1 CAPSULE 30 TO 60 MINUTES BEFORE YOUR FIRST AND LAST MEALS OF THE DAY 180 capsule 0   potassium chloride SA (KLOR-CON M) 20 MEQ tablet TAKE 1 TABLET EVERY DAY 90 tablet 0   spironolactone (ALDACTONE) 25 MG tablet TAKE 1 TABLET EVERY DAY (NEED MD APPOINTMENT)  90 tablet 0   No current facility-administered medications for this visit.   There were no vitals filed for this visit.  Wt Readings from Last 3 Encounters:  01/30/22 82.3 kg (181 lb 6.4 oz)  05/25/21 90.7 kg (200 lb)  10/22/20 91.5 kg (201 lb 12.8 oz)  PHYSICAL EXAM: General: Well appearing. No distress on RA Cardiac: JVP flat. S1 and S2 present. No murmurs or rub. Resp: Expiratory wheezing in upper lobes Extremities: Warm and dry.  No edema.  Neuro: Alert and oriented x3. Affect pleasant. Moves all extremities without difficulty.  ECG (personally reviewed): SR 66 bpm with 1AVB. PR 288 ms, QRS 126 ms  ASSESSMENT & PLAN:  1. Chronic Systolic HF  - Tachy-mediated CM in the setting of recurrent AF d/t viral illness - Echo 12/18 LVEF ~20%, worsened from Echo 2/18 EF 45-50% in setting of Afib RVR.  - Echo 8/19 EF 40-45% with LBBB  - Echo 3/23 EF 40%, marked dyssynchrony, nl RV - Stable NYHA II from COPD mostly.  - Volume stable.  - Continue lasix 40 mg daily  - Continue spiro 25 mg daily.  - Continue losartan to 100 mg daily. He has refused Entresto with cost. - Continue bisoprolol 2.5 mg bid - Would like to avoid Jardiance due to cost - Needs repeat echo. EF currently too high for CRT-D  2. PAF  - Remains in NSR on ECG today - Continue amiodarone 100 mg daily. Has not had AF in years, would like to stop with risk of respiratory tox, however patient would like to discuss this with Dr. Bensimhon first.  - Previously referred to EP to discuss possible AF ablation as he has had 2 life-threatening episodes of cardiogenic shock in setting of PAF but he has deferred.  - Refuses AC due to previous RP hemorrhage with heparin  3. COPD - Sees Dr. Waymond Hailey (has not seen since 10/23) - Continue Breztri  - No longer smoking  4. H/o Transient loss of R peripheral vision, memory disturbances - last visit transient vision loss in 2022, this visit describes another episode where he did not  respond to his family which he downplays a bit - No recurrence. Has not wanted further w/u  5. HTN - Blood pressure well controlled. Continue current regimen.  6. LBBB - QRS Stable   Follow up with Dr. Julane Ny + Echo in 2 months.   [place] Matson Welch, NP  3:50 PM

## 2023-07-25 ENCOUNTER — Telehealth (HOSPITAL_COMMUNITY): Payer: Self-pay

## 2023-07-25 NOTE — Telephone Encounter (Signed)
 Called to confirm/remind patient of their appointment at the [facility] on 07/26/23.   Appointment:   [x] Confirmed  [] Left mess   [] No answer/No voice mail  [] Phone not in service  Patient reminded to bring all medications and/or complete list.  Confirmed patient has transportation. Gave directions, instructed to utilize valet parking.

## 2023-07-26 ENCOUNTER — Other Ambulatory Visit (HOSPITAL_COMMUNITY): Payer: Self-pay | Admitting: Internal Medicine

## 2023-07-26 ENCOUNTER — Ambulatory Visit (HOSPITAL_COMMUNITY)
Admission: RE | Admit: 2023-07-26 | Discharge: 2023-07-26 | Disposition: A | Discharge status: Home / Self Care | Source: Ambulatory Visit | Attending: Cardiology | Admitting: Cardiology

## 2023-07-26 ENCOUNTER — Encounter (HOSPITAL_COMMUNITY): Payer: Self-pay

## 2023-07-26 VITALS — BP 122/68 | HR 65 | Ht 73.0 in | Wt 176.6 lb

## 2023-07-26 DIAGNOSIS — I110 Hypertensive heart disease with heart failure: Secondary | ICD-10-CM | POA: Insufficient documentation

## 2023-07-26 DIAGNOSIS — I447 Left bundle-branch block, unspecified: Secondary | ICD-10-CM | POA: Diagnosis not present

## 2023-07-26 DIAGNOSIS — Z5986 Financial insecurity: Secondary | ICD-10-CM | POA: Insufficient documentation

## 2023-07-26 DIAGNOSIS — R000 Tachycardia, unspecified: Secondary | ICD-10-CM | POA: Diagnosis not present

## 2023-07-26 DIAGNOSIS — I428 Other cardiomyopathies: Secondary | ICD-10-CM | POA: Diagnosis not present

## 2023-07-26 DIAGNOSIS — Z79899 Other long term (current) drug therapy: Secondary | ICD-10-CM | POA: Insufficient documentation

## 2023-07-26 DIAGNOSIS — J449 Chronic obstructive pulmonary disease, unspecified: Secondary | ICD-10-CM

## 2023-07-26 DIAGNOSIS — I10 Essential (primary) hypertension: Secondary | ICD-10-CM | POA: Diagnosis not present

## 2023-07-26 DIAGNOSIS — I480 Paroxysmal atrial fibrillation: Secondary | ICD-10-CM

## 2023-07-26 DIAGNOSIS — I5022 Chronic systolic (congestive) heart failure: Secondary | ICD-10-CM | POA: Diagnosis present

## 2023-07-26 DIAGNOSIS — Z87891 Personal history of nicotine dependence: Secondary | ICD-10-CM | POA: Diagnosis not present

## 2023-07-26 LAB — LIPID PANEL
Cholesterol: 251 mg/dL — ABNORMAL HIGH (ref 0–200)
HDL: 37 mg/dL — ABNORMAL LOW (ref >40)
LDL Cholesterol: 144 mg/dL — ABNORMAL HIGH (ref 0–99)
Total CHOL/HDL Ratio: 6.8 ratio
Triglycerides: 348 mg/dL — ABNORMAL HIGH (ref <150)
VLDL: 70 mg/dL — ABNORMAL HIGH (ref 0–40)

## 2023-07-26 LAB — COMPREHENSIVE METABOLIC PANEL WITH GFR
ALT: 16 U/L (ref 0–44)
AST: 18 U/L (ref 15–41)
Albumin: 4 g/dL (ref 3.5–5.0)
Alkaline Phosphatase: 68 U/L (ref 38–126)
Anion gap: 10 (ref 5–15)
BUN: 15 mg/dL (ref 8–23)
CO2: 25 mmol/L (ref 22–32)
Calcium: 9.6 mg/dL (ref 8.9–10.3)
Chloride: 102 mmol/L (ref 98–111)
Creatinine, Ser: 1.12 mg/dL (ref 0.61–1.24)
GFR, Estimated: >60 mL/min (ref >60)
Glucose, Bld: 114 mg/dL — ABNORMAL HIGH (ref 70–99)
Potassium: 4.2 mmol/L (ref 3.5–5.1)
Sodium: 137 mmol/L (ref 135–145)
Total Bilirubin: 0.5 mg/dL (ref 0.0–1.2)
Total Protein: 7 g/dL (ref 6.5–8.1)

## 2023-07-26 LAB — TSH: TSH: 2.802 u[IU]/mL (ref 0.350–4.500)

## 2023-07-26 MED ORDER — LOSARTAN POTASSIUM 100 MG PO TABS: 100.0000 mg | ORAL_TABLET | Freq: Every day | ORAL | 3 refills | Status: DC

## 2023-07-26 MED ORDER — SPIRONOLACTONE 25 MG PO TABS: 25.0000 mg | ORAL_TABLET | Freq: Every day | ORAL | 3 refills | Status: DC

## 2023-07-26 MED ORDER — AMIODARONE HCL 200 MG PO TABS: 200.0000 mg | ORAL_TABLET | Freq: Every day | ORAL | 3 refills | Status: DC

## 2023-07-26 MED ORDER — BISOPROLOL FUMARATE 5 MG PO TABS: ORAL_TABLET | ORAL | 3 refills | Status: AC

## 2023-07-26 MED ORDER — FUROSEMIDE 40 MG PO TABS: 40.0000 mg | ORAL_TABLET | Freq: Every day | ORAL | 3 refills | Status: DC

## 2023-07-26 MED ORDER — POTASSIUM CHLORIDE CRYS ER 20 MEQ PO TBCR: 20.0000 meq | EXTENDED_RELEASE_TABLET | Freq: Every day | ORAL | 3 refills | Status: DC

## 2023-07-26 MED ORDER — ROSUVASTATIN CALCIUM 20 MG PO TABS: 20.0000 mg | ORAL_TABLET | Freq: Every day | ORAL | 3 refills | Status: AC

## 2023-07-26 NOTE — Patient Instructions (Signed)
 Medication Changes:  No Changes In Medications at this time.   Lab Work:  Labs done today, your results will be available in MyChart, we will contact you for abnormal readings.  Follow-Up in: AS SCHEDULED WITH AN ECHOCARDIOGRAM   At the [facility], you and your health needs are our priority. We have a designated team specialized in the treatment of Heart Failure. This Care Team includes your primary Heart Failure Specialized Cardiologist (physician), Advanced Practice Providers (APPs- Physician Assistants and Nurse Practitioners), and Pharmacist who all work together to provide you with the care you need, when you need it.   You may see any of the following providers on your designated Care Team at your next follow up:  Dr. Jules Oar Dr. Peder Bourdon Dr. Alwin Baars Dr. Judyth Nunnery Nieves Bars, NP Ruddy Corral, [place] [facility] [place], [place] Dennise Fitz, NP [place] Lee, NP Luster Salters, PharmD   Please be sure to bring in all your medications bottles to every appointment.   Need to Contact Us :  If you have any questions or concerns before your next appointment please send us  a message through [place] or call our office at [phone].    TO LEAVE A MESSAGE FOR THE NURSE SELECT OPTION 2, PLEASE LEAVE A MESSAGE INCLUDING: YOUR NAME DATE OF BIRTH CALL BACK NUMBER REASON FOR CALL**this is important as we prioritize the call backs  YOU WILL RECEIVE A CALL BACK THE SAME DAY AS LONG AS YOU CALL BEFORE 4:00 PM

## 2023-09-24 ENCOUNTER — Telehealth (HOSPITAL_COMMUNITY): Payer: Self-pay | Admitting: Internal Medicine

## 2023-09-24 NOTE — Telephone Encounter (Signed)
 Called to confirm/remind patient of their appointment at the [facility] on 09/24/23.   Appointment:   [x] Confirmed  [] Left mess   [] No answer/No voice mail  [] VM Full/unable to leave message  [] Phone not in service  Patient reminded to bring all medications and/or complete list.  Confirmed patient has transportation. Gave directions, instructed to utilize valet parking.

## 2023-09-30 NOTE — Progress Notes (Signed)
 Advanced Heart Failure Clinic Note   Primary Cardiologist: Dr. Cherrie   Reason for Visit: Heart Failure  HPI: Reginald Hahn is a 73 y.o. male with h/o COPD, HFrEF, LBBB,  AF with RVR and cardiogenic shock, and LBBB.  Admitted with cardiogenic shock in setting of new AF in 2017. EF 15%. Was scheduled for cath and TEE-DCCV, however developed massive RP bleed. Treated with IV amio. During that time also had loss of peripheral vision in R eye. Head CT negative. Pre-discharge had Myoview  with EF 30-44%.  Inferior infarct vs gut attenuation. No ischemia.   Pulmonary Clinic with Dr. Noreen and told he has severe COPD FEV1 1.83 (49%) FEF 25-75% 0.60L  DLCO 42%.   Admitted again in 2019 with CGS in setting of AF RVR requiring Milrinone . Converted to NSR on IV amio. EF 30-35%  Echo 2/23 RF 40% with marked septal dyssynchrony, G1DD, nlRV  Lost to f/u until 4/25. Was doing well, NYHA II, limited mostly by COPD. Was in NSR on amio 100   Cardiac Studies: Echo 8/17 EF 45% Echo 2/18 EF 45-50% Echo 12/18 EF 10-15% Echo 2/19 EF 25-30% Echo 3/19 EF 30-35% Echo 8/19 EF 40-45% Echo 3/23 EF 40%  Review of systems complete and found to be negative unless listed in HPI.    Past Medical History:  Diagnosis Date   Allergic rhinitis    Allergy     seasonal allergies   Arthritis    Atrial fibrillation (HCC) 03/2017   Black widow spider bite    CHF (congestive heart failure) (HCC)    Chronic systolic heart failure (HCC) 09/16/2015   Possibly tachycardia mediated // Echo 12/18: EF 20-25, trivial AI, mild RAE, PASP 35   COPD (chronic obstructive pulmonary disease) (HCC)    Hypertension    Stroke (cerebrum) (HCC)    Tobacco use    x40 years. 1 ppd   SH:  Social History   Socioeconomic History   Marital status: Divorced    Spouse name: Not on file   Number of children: Not on file   Years of education: Not on file   Highest education level: Not on file  Occupational History    Not on file  Tobacco Use   Smoking status: Former    Current packs/day: 0.00    Average packs/day: 2.0 packs/day for 52.5 years (105.0 ttl pk-yrs)    Types: Cigarettes    Start date: 04/14/1963    Quit date: 10/09/2015    Years since quitting: 7.9   Smokeless tobacco: Never   Tobacco comments:    stopped for a couple of years in his 30s  Vaping Use   Vaping status: Never Used  Substance and Sexual Activity   Alcohol use: No    Alcohol/week: 0.0 standard drinks of alcohol   Drug use: No    Comment: Used to smoke marijuana   Sexual activity: Not on file  Other Topics Concern   Not on file  Social History Narrative   Lives alone   2 daughters, ex-wife lives in [place]   Owns a plumbing company   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 No pets currently. Remote exposure to a parrot for 2 years.    Social Drivers of Health   Financial Resource Strain: Medium Risk (06/05/2017)   Overall Financial Resource Strain (CARDIA)  Difficulty of Paying Living Expenses: Somewhat hard  Food Insecurity: Not on file  Transportation Needs: Not on file  Physical Activity: Inactive (06/05/2017)   Exercise Vital Sign    Days of Exercise per Week: 0 days    Minutes of Exercise per Session: 0 min  Stress: Not on file  Social Connections: Not on file  Intimate Partner Violence: Not on file    FH:  Family History  Problem Relation Age of Onset   Heart disease Mother        died in her 15's.   Diabetes Mother    Prostate cancer Father        died in his 30's.   Congenital heart disease Brother        died @ age 76.   Lung disease Neg Hx     Past Medical History:  Diagnosis Date   Allergic rhinitis    Allergy     seasonal allergies   Arthritis    Atrial fibrillation (HCC) 03/2017   Black widow spider bite    CHF (congestive heart  failure) (HCC)    Chronic systolic heart failure (HCC) 09/16/2015   Possibly tachycardia mediated // Echo 12/18: EF 20-25, trivial AI, mild RAE, PASP 35   COPD (chronic obstructive pulmonary disease) (HCC)    Hypertension    Stroke (cerebrum) (HCC)    Tobacco use    x40 years. 1 ppd    Current Outpatient Medications  Medication Sig Dispense Refill   acetaminophen  (TYLENOL ) 325 MG tablet Take 650 mg by mouth every 6 (six) hours as needed for mild pain or fever.     albuterol  (VENTOLIN  HFA) 108 (90 Base) MCG/ACT inhaler Inhale 2 puffs into the lungs every 4 (four) hours as needed for wheezing or shortness of breath. 54 g 3   amiodarone  (PACERONE ) 200 MG tablet Take 1 tablet (200 mg total) by mouth daily. 90 tablet 3   bisoprolol  (ZEBETA ) 5 MG tablet TAKE 1/2 TABLET EVERY DAY 45 tablet 3   budesonide -formoterol  (SYMBICORT ) 80-4.5 MCG/ACT inhaler Inhale 2 puffs into the lungs 2 (two) times daily.     cetirizine (ZYRTEC) 10 MG tablet Take 10 mg by mouth daily.     furosemide  (LASIX ) 40 MG tablet Take 1 tablet (40 mg total) by mouth daily. 90 tablet 3   losartan  (COZAAR ) 100 MG tablet Take 1 tablet (100 mg total) by mouth daily. 90 tablet 3   omeprazole  (PRILOSEC) 20 MG capsule TAKE 1 CAPSULE 30 TO 60 MINUTES BEFORE YOUR FIRST AND LAST MEALS OF THE DAY 180 capsule 0   potassium chloride  SA (KLOR-CON  M) 20 MEQ tablet Take 1 tablet (20 mEq total) by mouth daily. 90 tablet 3   rosuvastatin  (CRESTOR ) 20 MG tablet Take 1 tablet (20 mg total) by mouth at bedtime. 90 tablet 3   spironolactone  (ALDACTONE ) 25 MG tablet Take 1 tablet (25 mg total) by mouth daily. 90 tablet 3   No current facility-administered medications for this visit.   There were no vitals filed for this visit.  Wt Readings from Last 3 Encounters:  07/26/23 80.1 kg (176 lb 9.6 oz)  01/30/22 82.3 kg (181 lb 6.4 oz)  05/25/21 90.7 kg (200 lb)    PHYSICAL EXAM: General: Well appearing. No distress on RA Cardiac: JVP flat. S1 and  S2 present. No murmurs or rub. Resp: Expiratory wheezing in upper lobes Extremities: Warm and dry.  No edema.  Neuro: Alert and oriented x3. Affect pleasant. Moves all extremities without  difficulty.  ECG (personally reviewed): SR 66 bpm with 1AVB. PR 288 ms, QRS 126 ms  ASSESSMENT & PLAN:  1. Chronic Systolic HF  - Tachy-mediated CM in the setting of recurrent AF d/t viral illness - Echo 12/18 LVEF ~20%, worsened from Echo 2/18 EF 45-50% in setting of Afib RVR.  - Echo 8/19 EF 40-45% with LBBB  - Echo 3/23 EF 40%, marked dyssynchrony, nl RV - Stable NYHA II from COPD mostly.  - Volume stable.  - Continue lasix  40 mg daily  - Continue spiro 25 mg daily.  - Continue losartan  to 100 mg daily. He has refused Entresto  with cost. - Continue bisoprolol  2.5 mg bid - Would like to avoid Jardiance due to cost - Suspect EF down due to LBBB dyssynchrony has refused CRT in past  2. PAF  - Remains in NSR on amio 100 daily - Given severe COPD ideally would like to stop but unable to do so as he has had 2 life-threatening admits for shock in setting of AF -  As EF is still down options are continue amio vs consideration of AF ablation (refused previously) - Refuses AC due to previous RP hemorrhage with heparin   3. COPD - Sees Dr. Darlean (has not seen since 10/23) - Continue Breztri   - No longer smoking  4. H/o Transient loss of R peripheral vision, memory disturbances - last visit transient vision loss in 2022, this visit describes another episode where he did not respond to his family which he downplays a bit - No recurrence. Has not wanted further w/u  5. HTN - Blood pressure well controlled. Continue current regimen.  6. LBBB - QRS Stable   Toribio Fuel, MD  8:25 PM

## 2023-10-01 ENCOUNTER — Ambulatory Visit (HOSPITAL_COMMUNITY)
Admission: RE | Admit: 2023-10-01 | Discharge: 2023-10-01 | Disposition: A | Discharge status: Home / Self Care | Source: Ambulatory Visit | Attending: Cardiology | Admitting: Cardiology

## 2023-10-01 ENCOUNTER — Ambulatory Visit (HOSPITAL_BASED_OUTPATIENT_CLINIC_OR_DEPARTMENT_OTHER)
Admission: RE | Admit: 2023-10-01 | Discharge: 2023-10-01 | Disposition: A | Discharge status: Home / Self Care | Source: Ambulatory Visit | Attending: Internal Medicine | Admitting: Internal Medicine

## 2023-10-01 VITALS — BP 120/78 | HR 60 | Wt 180.4 lb

## 2023-10-01 DIAGNOSIS — J449 Chronic obstructive pulmonary disease, unspecified: Secondary | ICD-10-CM | POA: Insufficient documentation

## 2023-10-01 DIAGNOSIS — I4891 Unspecified atrial fibrillation: Secondary | ICD-10-CM | POA: Diagnosis not present

## 2023-10-01 DIAGNOSIS — I428 Other cardiomyopathies: Secondary | ICD-10-CM | POA: Diagnosis not present

## 2023-10-01 DIAGNOSIS — Z87891 Personal history of nicotine dependence: Secondary | ICD-10-CM | POA: Insufficient documentation

## 2023-10-01 DIAGNOSIS — I5022 Chronic systolic (congestive) heart failure: Secondary | ICD-10-CM

## 2023-10-01 DIAGNOSIS — I480 Paroxysmal atrial fibrillation: Secondary | ICD-10-CM | POA: Diagnosis not present

## 2023-10-01 DIAGNOSIS — I447 Left bundle-branch block, unspecified: Secondary | ICD-10-CM | POA: Insufficient documentation

## 2023-10-01 DIAGNOSIS — I110 Hypertensive heart disease with heart failure: Secondary | ICD-10-CM | POA: Insufficient documentation

## 2023-10-01 DIAGNOSIS — Z8669 Personal history of other diseases of the nervous system and sense organs: Secondary | ICD-10-CM | POA: Insufficient documentation

## 2023-10-01 DIAGNOSIS — I440 Atrioventricular block, first degree: Secondary | ICD-10-CM | POA: Insufficient documentation

## 2023-10-01 MED ORDER — AMIODARONE HCL 200 MG PO TABS: 100.0000 mg | ORAL_TABLET | Freq: Every day | ORAL | 3 refills | Status: AC

## 2023-10-01 NOTE — Progress Notes (Signed)
  Echocardiogram 2D Echocardiogram has been performed.  Koleen KANDICE Popper, RDCS 10/01/2023, 1:34 PM

## 2023-10-01 NOTE — Patient Instructions (Signed)
 Great to see you today!!!  Medication Changes:  RESTART Amiodarone  100 mg (1/2 tab) Daily  Special Instructions // Education:  Do the following things EVERYDAY: Weigh yourself in the morning before breakfast. Write it down and keep it in a log. Take your medicines as prescribed Eat low salt foods--Limit salt (sodium) to 2000 mg per day.  Stay as active as you can everyday Limit all fluids for the day to less than 2 liters   Follow-Up in: 1 year (June 2026), **PLEASE CALL OUR OFFICE IN APR[facility] TO SCHEDULE THIS APPOINTMENT    At the [facility], you and your health needs are our priority. We have a designated team specialized in the treatment of Heart Failure. This Care Team includes your primary Heart Failure Specialized Cardiologist (physician), Advanced Practice Providers (APPs- Physician Assistants and Nurse Practitioners), and Pharmacist who all work together to provide you with the care you need, when you need it.   You may see any of the following providers on your designated Care Team at your next follow up:  Dr. Toribio Fuel Dr. Ezra Shuck Dr. Ria Commander Dr. Odis Brownie Greig Mosses, NP Caffie Shed, [place] [facility] [place], [place] Beckey Coe, NP [place] Lee, NP Tinnie Redman, PharmD   Please be sure to bring in all your medications bottles to every appointment.   Need to Contact Us :  If you have any questions or concerns before your next appointment please send us  a message through [place] or call our office at [phone].    TO LEAVE A MESSAGE FOR THE NURSE SELECT OPTION 2, PLEASE LEAVE A MESSAGE INCLUDING: YOUR NAME DATE OF BIRTH CALL BACK NUMBER REASON FOR CALL**this is important as we prioritize the call backs  YOU WILL RECEIVE A CALL BACK THE SAME DAY AS LONG AS YOU CALL BEFORE 4:00 PM

## 2023-10-02 LAB — ECHOCARDIOGRAM COMPLETE
Area-P 1/2: 2.99 cm2
S' Lateral: 3.5 cm

## 2023-10-09 ENCOUNTER — Encounter: Payer: Self-pay | Admitting: Student in an Organized Health Care Education/Training Program

## 2023-10-09 ENCOUNTER — Ambulatory Visit (INDEPENDENT_AMBULATORY_CARE_PROVIDER_SITE_OTHER): Admitting: Student in an Organized Health Care Education/Training Program

## 2023-10-09 VITALS — BP 135/56 | HR 66 | Ht 72.5 in | Wt 179.0 lb

## 2023-10-09 DIAGNOSIS — E611 Iron deficiency: Secondary | ICD-10-CM | POA: Insufficient documentation

## 2023-10-09 DIAGNOSIS — I480 Paroxysmal atrial fibrillation: Secondary | ICD-10-CM

## 2023-10-09 DIAGNOSIS — J449 Chronic obstructive pulmonary disease, unspecified: Secondary | ICD-10-CM

## 2023-10-09 DIAGNOSIS — I5022 Chronic systolic (congestive) heart failure: Secondary | ICD-10-CM

## 2023-10-09 DIAGNOSIS — D494 Neoplasm of unspecified behavior of bladder: Secondary | ICD-10-CM | POA: Insufficient documentation

## 2023-10-09 NOTE — Assessment & Plan Note (Signed)
 Chronic and stable.  Possibly due to atrial fibrillation, managed with Dr. Bensimhon.  Last echocardiogram showed that he has heart failure with recovered EF.  NYHA class I-II symptoms currently.  He is euvolemic on exam, very well compensated on current goal-directed medical therapy.  Doing really well on his medications including spironolactone , losartan , bisoprolol , and Lasix .  No recent hospitalizations or exacerbations.

## 2023-10-09 NOTE — Assessment & Plan Note (Signed)
 Chronic bleeding over the last 4 months due to hematuria due to bladder tumors.  Bleeding has been heavy at times with clot passage.  He is at risk for iron deficiency and anemia.  He is having some increased shortness of breath with exertion.  Will check a CBC and iron levels today and supplement iron if needed.

## 2023-10-09 NOTE — Assessment & Plan Note (Signed)
 New problem, planning for bladder resection through cystoscopy with [facility] sometime in the next 30 days.  Patient is at risk for urothelial malignancy due to history of tobacco use.  It is unknown right now agreed that the bladder wall is invaded.  He continues to have intermittent hematuria, at times passing clots.  No symptoms of bladder outlet obstruction.  For perioperative evaluation, he has some chronic medical conditions including heart failure with recovered EF and COPD.  These conditions are all well compensated right now, he is on appropriate medical therapy.  He has excellent exertional capacity, can complete greater than 4 metabolic equivalents.  I think he is medically optimized to proceed with this necessary, low risk cystoscopy procedure.

## 2023-10-09 NOTE — Progress Notes (Signed)
 New Patient Office Visit  Subjective    Patient ID: Reginald Hahn, male    DOB: December 29, 1950  Age: 73 y.o. MRN: [id number]  CC:   Chief Complaint  Patient presents with   Establish Care    Urinating blood for a while and does have tumor in bladder. [facility].     HPI  Reginald Hahn presents to establish care  73 year old person here for establishing care.  He has a number of chronic medical conditions including heart failure with previously reduced EF and atrial fibrillation that are managed by Dr. Bensimhon.  He was very ill around 2017, had an episode of cardiogenic shock, hospitalization complicated by spontaneous retroperitoneal bleeding.  Over the last few years he has stabilized really nicely on goal-directed medical therapy.  Echocardiogram done just a month ago showed recovered ejection fraction.  He has been doing really well, lives independently in [place], has 2 daughters, 1 of whom is moving back to the area soon.  He is a retired Nutritional therapist.  Former tobacco use, quit 8 years ago when he became severely ill with heart failure.  Has COPD, managed with Symbicort , has consulted with pulmonology in the past, no recent exacerbations.  Most significant recent change is intermittent hematuria over the last 4 months.  This was evaluated urgent care, CT pelvis showed a bladder tumor.  He had urgent follow-up with urology, cystoscopy showed multiple bladder tumors.  They are planning for a cystoscopy with tumor excision in the coming month.  He is thinking about this well, he is in good spirits, understands the importance of going through this procedure.  He understands the risk of a urothelial malignancy, and the implications of there is bladder wall invasion.  He has modest shortness of breath with ambulation.  He can go shopping but has to stop somewhat frequently for rest.  No cough.  Weight has been stable.  No lower extremity edema.  No chest pain or chest  pressure.  No lightheadedness or syncope.  No sensation of palpitations.  He does not have awareness if he goes into A-fib.    Outpatient Encounter Medications as of 10/09/2023  Medication Sig   acetaminophen  (TYLENOL ) 325 MG tablet Take 650 mg by mouth every 6 (six) hours as needed for mild pain or fever.   albuterol  (VENTOLIN  HFA) 108 (90 Base) MCG/ACT inhaler Inhale 2 puffs into the lungs every 4 (four) hours as needed for wheezing or shortness of breath.   amiodarone  (PACERONE ) 200 MG tablet Take 0.5 tablets (100 mg total) by mouth daily.   bisoprolol  (ZEBETA ) 5 MG tablet TAKE 1/2 TABLET EVERY DAY   budesonide -formoterol  (SYMBICORT ) 80-4.5 MCG/ACT inhaler Inhale 2 puffs into the lungs 2 (two) times daily.   cetirizine (ZYRTEC) 10 MG tablet Take 10 mg by mouth daily.   furosemide  (LASIX ) 40 MG tablet Take 1 tablet (40 mg total) by mouth daily.   losartan  (COZAAR ) 100 MG tablet Take 100 mg by mouth daily.   omeprazole  (PRILOSEC) 20 MG capsule TAKE 1 CAPSULE 30 TO 60 MINUTES BEFORE YOUR FIRST AND LAST MEALS OF THE DAY   rosuvastatin  (CRESTOR ) 20 MG tablet Take 1 tablet (20 mg total) by mouth at bedtime.   spironolactone  (ALDACTONE ) 25 MG tablet Take 1 tablet (25 mg total) by mouth daily.   [DISCONTINUED] potassium chloride  SA (KLOR-CON  M) 20 MEQ tablet Take 1 tablet (20 mEq total) by mouth daily. (Patient not taking: Reported on 10/09/2023)   No facility-administered  encounter medications on file as of 10/09/2023.    Past Medical History:  Diagnosis Date   Allergic rhinitis    Allergy     seasonal allergies   Arthritis    Atrial fibrillation (HCC) 03/2017   Black widow spider bite    CHF (congestive heart failure) (HCC)    Chronic systolic heart failure (HCC) 09/16/2015   Possibly tachycardia mediated // Echo 12/18: EF 20-25, trivial AI, mild RAE, PASP 35   COPD (chronic obstructive pulmonary disease) (HCC)    Hypertension    Stroke (cerebrum) (HCC)    Tobacco abuse 09/15/2015    Tobacco use    x40 years. 1 ppd    Past Surgical History:  Procedure Laterality Date   APPENDECTOMY     Mid 1960s [childhood]   CARDIAC CATHETERIZATION  08/29/2016   CARDIOVERSION N/A 04/05/2017   Procedure: CARDIOVERSION;  Surgeon: Cherrie Toribio SAUNDERS, MD;  Location: [facility] ENDOSCOPY;  Service: Cardiovascular;  Laterality: N/A;   RIGHT/LEFT HEART CATH AND CORONARY ANGIOGRAPHY N/A 08/29/2016   Procedure: Right/Left Heart Cath and Coronary Angiography;  Surgeon: Cherrie Toribio SAUNDERS, MD;  Location: [facility] INVASIVE CV LAB;  Service: Cardiovascular;  Laterality: N/A;   SMALL INTESTINE SURGERY     as a child from swallowing bubble gum   TEE WITHOUT CARDIOVERSION N/A 04/05/2017   Procedure: TRANSESOPHAGEAL ECHOCARDIOGRAM (TEE);  Surgeon: Cherrie Toribio SAUNDERS, MD;  Location: [facility] ENDOSCOPY;  Service: Cardiovascular;  Laterality: N/A;   TONSILLECTOMY     VASECTOMY      Family History  Problem Relation Age of Onset   Heart disease Mother        died in her 61's.   Diabetes Mother    Prostate cancer Father        died in his 64's.   Congenital heart disease Brother        died @ age 69.   Lung disease Neg Hx     Social History   Socioeconomic History   Marital status: Divorced    Spouse name: Not on file   Number of children: Not on file   Years of education: Not on file   Highest education level: Not on file  Occupational History   Not on file  Tobacco Use   Smoking status: Former    Current packs/day: 0.00    Average packs/day: 2.0 packs/day for 52.5 years (105.0 ttl pk-yrs)    Types: Cigarettes    Start date: 04/14/1963    Quit date: 10/09/2015    Years since quitting: 8.0   Smokeless tobacco: Never   Tobacco comments:    stopped for a couple of years in his 30s  Vaping Use   Vaping status: Never Used  Substance and Sexual Activity   Alcohol use: No    Alcohol/week: 0.0 standard drinks of alcohol   Drug use: No    Comment: Used to smoke marijuana   Sexual activity: Not on file   Other Topics Concern   Not on file  Social History Narrative   Lives alone   2 daughters, ex-wife lives in [place]   Owns a plumbing company   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 No pets currently. Remote exposure to a parrot for 2 years.    Social Drivers of Health   Financial Resource Strain: Medium Risk (06/05/2017)   Overall  Financial Resource Strain (CARDIA)    Difficulty of Paying Living Expenses: Somewhat hard  Food Insecurity: Not on file  Transportation Needs: Not on file  Physical Activity: Inactive (06/05/2017)   Exercise Vital Sign    Days of Exercise per Week: 0 days    Minutes of Exercise per Session: 0 min  Stress: Not on file  Social Connections: Not on file  Intimate Partner Violence: Not on file        Objective    BP (!) 135/56   Pulse 66   Ht 6' 0.5 (1.842 m)   Wt 179 lb (81.2 kg)   SpO2 95%   BMI 23.94 kg/m   Physical Exam  Gen: Well-appearing man Eyes: Normal Ears: Hearing aids in place bilaterally Neck: Normal thyroid , no adenopathy, no nodules Heart: Distant heart sounds, bradycardic sounding, no murmur Lungs: Unlabored, clear to auscultation anteriorly with no wheezing or crackles today Skin: On his back he has multiple seborrheic keratoses and a 2 cm inclusion cyst on the midline of his back which is not erythematous and not draining. Ext: Warm, no edema, normal joints Psych: Appropriate mood and affect, not anxious or depressed appearing Neuro: Alert, conversational with full strength upper and lower extremities, normal get up and go, normal gait, normal balance     Assessment & Plan:   Problem List Items Addressed This Visit       High   COPD  GOLD II (Chronic)   Chronic and stable.  Comanaged with Dr. Darlean.  No recent exacerbations.  He has dyspnea but it is difficult to tell  if that is from COPD or from heart failure.  Seems to be doing well on Symbicort  80 mcg twice daily.  Using albuterol  pretty frequently about once daily.  Lung exam today is excellent and he has good exertional capacity.      Paroxysmal atrial fibrillation (HCC) (Chronic)   Paroxysmal atrial fibrillation, comanaged with Dr. Bensimhon.  Doing well on amiodarone .  Last EKG showed sinus bradycardia.  He is unsure about his burden of atrial fibrillation.  He declines anticoagulation due to a history of a spontaneous retroperitoneal bleed.  Not a good candidate for anticoagulation right now because of hematuria due to a bladder tumor.      Relevant Medications   losartan  (COZAAR ) 100 MG tablet   Chronic systolic heart failure (HCC) (Chronic)   Chronic and stable.  Possibly due to atrial fibrillation, managed with Dr. Bensimhon.  Last echocardiogram showed that he has heart failure with recovered EF.  NYHA class I-II symptoms currently.  He is euvolemic on exam, very well compensated on current goal-directed medical therapy.  Doing really well on his medications including spironolactone , losartan , bisoprolol , and Lasix .  No recent hospitalizations or exacerbations.      Relevant Medications   losartan  (COZAAR ) 100 MG tablet   Other Relevant Orders   Basic metabolic panel with GFR   Bladder tumor - Primary   New problem, planning for bladder resection through cystoscopy with [facility] at [facility] sometime in the next 30 days.  Patient is at risk for urothelial malignancy due to history of tobacco use.  It is unknown right now agreed that the bladder wall is invaded.  He continues to have intermittent hematuria, at times passing clots.  No symptoms of bladder outlet obstruction.  For perioperative evaluation, he has some chronic medical conditions including heart failure with recovered EF and COPD.  These conditions are all well compensated right  now, he is on appropriate medical therapy.   He has excellent exertional capacity, can complete greater than 4 metabolic equivalents.  I think he is medically optimized to proceed with this necessary, low risk cystoscopy procedure.        Low   Iron deficiency   Chronic bleeding over the last 4 months due to hematuria due to bladder tumors.  Bleeding has been heavy at times with clot passage.  He is at risk for iron deficiency and anemia.  He is having some increased shortness of breath with exertion.  Will check a CBC and iron levels today and supplement iron if needed.      Relevant Orders   CBC   IBC + Ferritin    Return in about 3 months (around 01/09/2024).   Cleatus Debby Specking, MD

## 2023-10-09 NOTE — Assessment & Plan Note (Signed)
 Paroxysmal atrial fibrillation, comanaged with Dr. Bensimhon.  Doing well on amiodarone .  Last EKG showed sinus bradycardia.  He is unsure about his burden of atrial fibrillation.  He declines anticoagulation due to a history of a spontaneous retroperitoneal bleed.  Not a good candidate for anticoagulation right now because of hematuria due to a bladder tumor.

## 2023-10-09 NOTE — Assessment & Plan Note (Signed)
 Chronic and stable.  Comanaged with Dr. Darlean.  No recent exacerbations.  He has dyspnea but it is difficult to tell if that is from COPD or from heart failure.  Seems to be doing well on Symbicort  80 mcg twice daily.  Using albuterol  pretty frequently about once daily.  Lung exam today is excellent and he has good exertional capacity.

## 2023-10-10 LAB — CBC
HCT: 44.8 % (ref 39.0–52.0)
Hemoglobin: 14.9 g/dL (ref 13.0–17.0)
MCHC: 33.3 g/dL (ref 30.0–36.0)
MCV: 92.6 fl (ref 78.0–100.0)
Platelets: 238 10*3/uL (ref 150.0–400.0)
RBC: 4.84 Mil/uL (ref 4.22–5.81)
RDW: 13.2 % (ref 11.5–15.5)
WBC: 8.9 10*3/uL (ref 4.0–10.5)

## 2023-10-10 LAB — BASIC METABOLIC PANEL WITH GFR
BUN: 18 mg/dL (ref 6–23)
CO2: 29 meq/L (ref 19–32)
Calcium: 9.9 mg/dL (ref 8.4–10.5)
Chloride: 100 meq/L (ref 96–112)
Creatinine, Ser: 1.21 mg/dL (ref 0.40–1.50)
GFR: 59.83 mL/min — ABNORMAL LOW (ref >60.00)
Glucose, Bld: 108 mg/dL — ABNORMAL HIGH (ref 70–99)
Potassium: 4.6 meq/L (ref 3.5–5.1)
Sodium: 138 meq/L (ref 135–145)

## 2023-10-10 LAB — IBC + FERRITIN
Ferritin: 32.3 ng/mL (ref 22.0–322.0)
Iron: 94 ug/dL (ref 42–165)
Saturation Ratios: 20.7 % (ref 20.0–50.0)
TIBC: 453.6 ug/dL — ABNORMAL HIGH (ref 250.0–450.0)
Transferrin: 324 mg/dL (ref 212.0–360.0)

## 2023-10-11 ENCOUNTER — Ambulatory Visit: Payer: Self-pay | Admitting: Student in an Organized Health Care Education/Training Program

## 2023-11-26 ENCOUNTER — Telehealth: Payer: Self-pay

## 2023-11-26 NOTE — Telephone Encounter (Signed)
 Called patient and explained the differences and did get him scheduled for a physical 01/07/2024 with PCP to have the physical completed as well as AWV a few days later

## 2023-11-26 NOTE — Telephone Encounter (Signed)
 Copied from CRM #[id number]. Topic: General - Other >> Nov 26, 2023  3:30 PM Burnard DEL wrote: Reason for CRM: Patient called in regarding his appointment with provider on 10/09/2023 to establish care.He said that he would like the provider to count that appointment as a wellness check since he had blood work and all done. He said that he is due for a wellness check with his insurance and would like for that appointment to be it.

## 2024-01-07 ENCOUNTER — Encounter: Payer: Self-pay | Admitting: Student in an Organized Health Care Education/Training Program

## 2024-01-07 ENCOUNTER — Ambulatory Visit: Admitting: Student in an Organized Health Care Education/Training Program

## 2024-01-07 VITALS — BP 117/63 | HR 64 | Ht 73.0 in | Wt 176.0 lb

## 2024-01-07 DIAGNOSIS — D494 Neoplasm of unspecified behavior of bladder: Secondary | ICD-10-CM | POA: Diagnosis not present

## 2024-01-07 DIAGNOSIS — I5022 Chronic systolic (congestive) heart failure: Secondary | ICD-10-CM

## 2024-01-07 DIAGNOSIS — J449 Chronic obstructive pulmonary disease, unspecified: Secondary | ICD-10-CM | POA: Diagnosis not present

## 2024-01-07 DIAGNOSIS — I480 Paroxysmal atrial fibrillation: Secondary | ICD-10-CM | POA: Diagnosis not present

## 2024-01-07 DIAGNOSIS — I10 Essential (primary) hypertension: Secondary | ICD-10-CM | POA: Diagnosis not present

## 2024-01-07 MED ORDER — SPIRIVA HANDIHALER 18 MCG IN CAPS: 18.0000 ug | ORAL_CAPSULE | Freq: Every day | RESPIRATORY_TRACT | 5 refills | Status: AC

## 2024-01-07 NOTE — Assessment & Plan Note (Addendum)
 Chronic and stable.  Looks well compensated today on exam.  Euvolemic.  NYHA class II symptoms.  Much of his dyspnea I think is from COPD rather than heart failure.  He will continue with losartan , bisoprolol , Spiro, and furosemide .

## 2024-01-07 NOTE — Progress Notes (Signed)
 Established Patient Office Visit  Subjective   Patient ID: Reginald Hahn, male    DOB: May 28, 1950  Age: 73 y.o. MRN: [id number]  Chief Complaint  Patient presents with   Annual Exam    No concerns  Has been bleeding quite a bit when urinating this week but is calling [facility] today  Pulled shoulder muscle on the right side   Patient declined all care gaps     HPI  Discussed the use of AI scribe software for clinical note transcription with the patient, who gave verbal consent to proceed.  History of Present Illness Reginald Hahn is a 73 year old male who presents with ongoing bladder bleeding.  He has been experiencing ongoing bladder bleeding, which has become more frequent over the past week, occurring every time he urinates. He describes the presence of clots that sometimes obstruct urine flow, requiring him to exert more effort to pass them. He reports that it is not very painful, but he does not like the situation and feels discomfort.   He has a history of COPD and experiences shortness of breath after walking one to two blocks, necessitating rest. He uses Symbicort  and albuterol  sulfate inhalers but questions their effectiveness. He has tried Breztri  in the past but did not tolerate it well. He has not used Spiriva  recently but has used it in the past. He attributes some of his breathing difficulties to a combination of COPD and allergies.  He has a history of atrial fibrillation and is currently on amiodarone  100 mg daily. He has experienced a major hemorrhage in the past when on blood thinners, leading to a stroke and subsequent rehabilitation. He is not currently on blood thinners due to the risk of bleeding.  His blood pressure is well-controlled, with daily readings between 115 and 123 systolic. No recent heart pain or surprises have been noted. He last saw his cardiologist in June.  He is 80% deaf and uses the strongest hearing aids available, but still  struggles with understanding speech, especially from younger people who speak quickly or softly.      Objective:     BP 117/63   Pulse 64   Ht 6' 1 (1.854 m) Comment: patient reported  Wt 176 lb (79.8 kg)   BMI 23.22 kg/m   Physical Exam  Gen: Well-appearing man Heart: Regular, no murmur, no JVD Lungs: Unlabored, mild inspiratory wheezing heard bilaterally Abd: Soft, nontender, no organomegaly, no tenderness over the bladder Ext: Warm, no edema, normal joints     Assessment & Plan:    Problem List Items Addressed This Visit       High   COPD  GOLD II - Primary (Chronic)   He has chronic COPD with exertional dyspnea, and current inhalers may be insufficient. Adding Spiriva  is recommended to improve lung function. Prescribed Spiriva  inhaler in addition to Symbicort .  May run into issues with the cost of Spiriva .  Looks like Incruse would be similarly expensive.      Relevant Medications   tiotropium (SPIRIVA  HANDIHALER) 18 MCG inhalation capsule   Paroxysmal atrial fibrillation (HCC) (Chronic)   He has paroxysmal atrial fibrillation controlled with amiodarone . The bleeding risk from the bladder tumor precludes anticoagulation. The cardiologist is satisfied with the management. Continue amiodarone  100 mg daily and avoiding blood thinners due to bleeding risk.      Chronic systolic heart failure (HCC) (Chronic)   Chronic and stable.  Looks well compensated today on exam.  Euvolemic.  NYHA  class II symptoms.  Much of his dyspnea I think is from COPD rather than heart failure.  He will continue with losartan , bisoprolol , Spiro, and furosemide .      Bladder tumor   He has an active bladder tumor with ongoing hematuria and potential obstruction from clots. There is concern for bladder cancer, and early intervention is important.  He had consultation at [facility] urology clinic with Dr. Malinda in June.  Planned for further cystoscopy in the operating room for biopsy and possible  use of gemcitabine in the bladder.  However patient has not scheduled this, he was waiting for more family support to get into town.  I asked if there were any further barriers to him scheduling, which he denied.  I strongly encouraged him to follow-up with the urologist as scheduled, early intervention will increase the risk of successful treatment.  Patient is in agreement and tells me he will call their office today.        Medium    HTN (hypertension) (Chronic)   His hypertension is well-controlled with the current regimen. Continue current antihypertensive medications.       Return in about 6 months (around 07/06/2024).    Cleatus Debby Specking, MD

## 2024-01-07 NOTE — Assessment & Plan Note (Signed)
 He has an active bladder tumor with ongoing hematuria and potential obstruction from clots. There is concern for bladder cancer, and early intervention is important.  He had consultation at [facility] urology clinic with Dr. Malinda in June.  Planned for further cystoscopy in the operating room for biopsy and possible use of gemcitabine in the bladder.  However patient has not scheduled this, he was waiting for more family support to get into town.  I asked if there were any further barriers to him scheduling, which he denied.  I strongly encouraged him to follow-up with the urologist as scheduled, early intervention will increase the risk of successful treatment.  Patient is in agreement and tells me he will call their office today.

## 2024-01-07 NOTE — Assessment & Plan Note (Signed)
 His hypertension is well-controlled with the current regimen. Continue current antihypertensive medications.

## 2024-01-07 NOTE — Assessment & Plan Note (Signed)
 He has chronic COPD with exertional dyspnea, and current inhalers may be insufficient. Adding Spiriva  is recommended to improve lung function. Prescribed Spiriva  inhaler in addition to Symbicort .  May run into issues with the cost of Spiriva .  Looks like Incruse would be similarly expensive.

## 2024-01-07 NOTE — Assessment & Plan Note (Signed)
 He has paroxysmal atrial fibrillation controlled with amiodarone . The bleeding risk from the bladder tumor precludes anticoagulation. The cardiologist is satisfied with the management. Continue amiodarone  100 mg daily and avoiding blood thinners due to bleeding risk.

## 2024-01-07 NOTE — Patient Instructions (Signed)
  VISIT SUMMARY: Today, you were seen for ongoing bladder bleeding, which has become more frequent and is causing discomfort. We also reviewed your COPD, atrial fibrillation, and hypertension management.  YOUR PLAN: -BLADDER TUMOR WITH HEMATURIA: You have a chronic bladder tumor that is causing blood in your urine and potential blockage from clots. This could be a sign of bladder cancer, so it is important to get it checked out as soon as possible. Please call [facility] to schedule a cystoscopy with Dr. Gillian. A referral to a urologist has been made for you.  -CHRONIC OBSTRUCTIVE PULMONARY DISEASE (COPD): Your COPD is causing you to feel short of breath after walking short distances. To help improve your lung function, we are adding a Spiriva  inhaler to your current Symbicort  inhaler. Continue using both inhalers as directed.  -ATRIAL FIBRILLATION: Your atrial fibrillation is being managed with amiodarone , and you should continue taking 100 mg daily. Due to the bleeding risk from your bladder tumor, you should avoid blood thinners.  -HYPERTENSION: Your blood pressure is well-controlled with your current medications. Continue taking your antihypertensive medications as prescribed.  INSTRUCTIONS: Please call [facility] in [facility] to schedule a cystoscopy with Dr. Gillian. Continue using your inhalers as directed and take your medications as prescribed. Follow up with your cardiologist as needed.

## 2024-01-09 ENCOUNTER — Ambulatory Visit (INDEPENDENT_AMBULATORY_CARE_PROVIDER_SITE_OTHER)

## 2024-01-09 ENCOUNTER — Ambulatory Visit: Admitting: Student in an Organized Health Care Education/Training Program

## 2024-01-09 VITALS — Ht 73.0 in | Wt 176.0 lb

## 2024-01-09 DIAGNOSIS — Z0000 Encounter for general adult medical examination without abnormal findings: Secondary | ICD-10-CM

## 2024-01-09 NOTE — Progress Notes (Signed)
 This visit was performed by a medical professional under my direct supervision. I was immediately available for consultation/collaboration. I have reviewed and agree with the Annual Wellness Visit documentation.

## 2024-01-09 NOTE — Patient Instructions (Signed)
 [name],  Thank you for taking the time for your Medicare Wellness Visit. I appreciate your continued commitment to your health goals. Please review the care plan we discussed, and feel free to reach out if I can assist you further.  Medicare recommends these wellness visits once per year to help you and your care team stay ahead of potential health issues. These visits are designed to focus on prevention, allowing your provider to concentrate on managing your acute and chronic conditions during your regular appointments.  Please note that Annual Wellness Visits do not include a physical exam. Some assessments may be limited, especially if the visit was conducted virtually. If needed, we may recommend a separate in-person follow-up with your provider.  Ongoing Care Seeing your primary care provider every 3 to 6 months helps us  monitor your health and provide consistent, personalized care. Last office visit on 01/07/2024.  You are eligible for a lung cancer screening.  Remember to discuss during you up coming office visit.    Referrals If a referral was made during today's visit and you haven't received any updates within two weeks, please contact the referred provider directly to check on the status.  Recommended Screenings:  Health Maintenance  Topic Date Due   Screening for Lung Cancer  07/06/2024*   DTaP/Tdap/Td vaccine (1 - Tdap) 07/06/2024*   Pneumococcal Vaccine for age over 46 (2 of 2 - PCV) 07/06/2024*   Colon Cancer Screening  07/06/2024*   COVID-19 Vaccine (1 - 2024-25 season) 07/06/2024*   Medicare Annual Wellness Visit  07/06/2024*   Zoster (Shingles) Vaccine (1 of 2) 07/06/2024*   Flu Shot  07/08/2024*   Hepatitis C Screening  Completed   HPV Vaccine  Aged Out   Meningitis B Vaccine  Aged Out  *Topic was postponed. The date shown is not the original due date.       01/09/2024   10:58 AM  Advanced Directives  Does Patient Have a Medical Advance Directive? Yes  Type  of Advance Directive Healthcare Power of Attorney  Copy of Healthcare Power of Attorney in Chart? No - copy requested   Advance Care Planning is important because it: Ensures you receive medical care that aligns with your values, goals, and preferences. Provides guidance to your family and loved ones, reducing the emotional burden of decision-making during critical moments.  Vision: Annual vision screenings are recommended for early detection of glaucoma, cataracts, and diabetic retinopathy. These exams can also reveal signs of chronic conditions such as diabetes and high blood pressure.  Dental: Annual dental screenings help detect early signs of oral cancer, gum disease, and other conditions linked to overall health, including heart disease and diabetes.  Please see the attached documents for additional preventive care recommendations.

## 2024-01-09 NOTE — Progress Notes (Signed)
 Subjective:   Reginald Hahn is a 73 y.o. who presents for a Medicare Wellness preventive visit.  As a reminder, Annual Wellness Visits don't include a physical exam, and some assessments may be limited, especially if this visit is performed virtually. We may recommend an in-person follow-up visit with your provider if needed.  Visit Complete: Virtual I connected with  Reginald Hahn on 01/09/24 by a audio enabled telemedicine application and verified that I am speaking with the correct person using two identifiers.  Patient Location: Home  Provider Location: Home Office  I discussed the limitations of evaluation and management by telemedicine. The patient expressed understanding and agreed to proceed.  Vital Signs: Because this visit was a virtual/telehealth visit, some criteria may be missing or patient reported. Any vitals not documented were not able to be obtained and vitals that have been documented are patient reported.  VideoDeclined- This patient declined Librarian, academic. Therefore the visit was completed with audio only.  Persons Participating in Visit: Patient.  AWV Questionnaire: No: Patient Medicare AWV questionnaire was not completed prior to this visit.  Cardiac Risk Factors include: advanced age (>37men, >24 women);hypertension;male gender;Other (see comment), Risk factor comments: A-fib, COPD     Objective:    Today's Vitals   01/09/24 1048  Weight: 176 lb (79.8 kg)  Height: 6' 1 (1.854 m)   Body mass index is 23.22 kg/m.     01/09/2024   10:58 AM 06/05/2017   12:19 PM 03/27/2017    6:01 PM 08/29/2016    9:31 AM 12/01/2015    2:17 PM 09/30/2015    8:42 AM 09/15/2015    6:00 PM  Advanced Directives  Does Patient Have a Medical Advance Directive? Yes Yes  Yes  Yes  No  Yes  No   Type of Sales promotion account executive of [employer] Power of [place];Living will Healthcare Power of  [place];Living will  Healthcare Power of [place];Living will    Does patient want to make changes to medical advance directive?  No - Patient declined  No - Patient declined    No - Patient declined    Copy of Healthcare Power of Attorney in Chart? No - copy requested  Yes  Yes   No - copy requested    Would patient like information on creating a medical advance directive?     No - patient declined information   No - patient declined information      Data saved with a previous flowsheet row definition    Current Medications (verified) Outpatient Encounter Medications as of 01/09/2024  Medication Sig   albuterol  (VENTOLIN  HFA) 108 (90 Base) MCG/ACT inhaler Inhale 2 puffs into the lungs every 4 (four) hours as needed for wheezing or shortness of breath.   amiodarone  (PACERONE ) 200 MG tablet Take 0.5 tablets (100 mg total) by mouth daily.   bisoprolol  (ZEBETA ) 5 MG tablet TAKE 1/2 TABLET EVERY DAY   budesonide -formoterol  (SYMBICORT ) 80-4.5 MCG/ACT inhaler Inhale 2 puffs into the lungs 2 (two) times daily.   cetirizine (ZYRTEC) 10 MG tablet Take 10 mg by mouth daily.   furosemide  (LASIX ) 40 MG tablet Take 1 tablet (40 mg total) by mouth daily.   losartan  (COZAAR ) 100 MG tablet Take 100 mg by mouth daily.   omeprazole  (PRILOSEC) 20 MG capsule TAKE 1 CAPSULE 30 TO 60 MINUTES BEFORE YOUR FIRST AND LAST MEALS OF THE DAY   rosuvastatin  (CRESTOR ) 20 MG tablet Take 1 tablet (  20 mg total) by mouth at bedtime.   spironolactone  (ALDACTONE ) 25 MG tablet Take 1 tablet (25 mg total) by mouth daily.   tiotropium (SPIRIVA  HANDIHALER) 18 MCG inhalation capsule Place 1 capsule (18 mcg total) into inhaler and inhale daily.   No facility-administered encounter medications on file as of 01/09/2024.    Allergies (verified) Heparin , Codeine, and Other   History: Past Medical History:  Diagnosis Date   Allergic rhinitis    Allergy     seasonal allergies   Arthritis    Atrial fibrillation (HCC) 03/2017    Black widow spider bite    CHF (congestive heart failure) (HCC)    Chronic systolic heart failure (HCC) 09/16/2015   Possibly tachycardia mediated // Echo 12/18: EF 20-25, trivial AI, mild RAE, PASP 35   COPD (chronic obstructive pulmonary disease) (HCC)    Hypertension    Stroke (cerebrum) (HCC)    Tobacco abuse 09/15/2015   Tobacco use    x40 years. 1 ppd   Past Surgical History:  Procedure Laterality Date   APPENDECTOMY     Mid 1960s [childhood]   CARDIAC CATHETERIZATION  08/29/2016   CARDIOVERSION N/A 04/05/2017   Procedure: CARDIOVERSION;  Surgeon: Cherrie Toribio SAUNDERS, MD;  Location[facility] ENDOSCOPY;  Service: Cardiovascular;  Laterality: N/A;   RIGHT/LEFT HEART CATH AND CORONARY ANGIOGRAPHY N/A 08/29/2016   Procedure: Right/Left Heart Cath and Coronary Angiography;  Surgeon: Cherrie Toribio SAUNDERS, MD;  Location: [facility] INVASIVE CV LAB;  Service: Cardiovascular;  Laterality: N/A;   SMALL INTESTINE SURGERY     as a child from swallowing bubble gum   TEE WITHOUT CARDIOVERSION N/A 04/05/2017   Procedure: TRANSESOPHAGEAL ECHOCARDIOGRAM (TEE);  Surgeon: Cherrie Toribio SAUNDERS, MD;  Location: [facility] ENDOSCOPY;  Service: Cardiovascular;  Laterality: N/A;   TONSILLECTOMY     VASECTOMY     Family History  Problem Relation Age of Onset   Heart disease Mother        died in her 39's.   Diabetes Mother    Prostate cancer Father        died in his 74's.   Congenital heart disease Brother        died @ age 2.   Lung disease Neg Hx    Social History   Socioeconomic History   Marital status: Divorced    Spouse name: Not on file   Number of children: 2   Years of education: Not on file   Highest education level: Not on file  Occupational History   Occupation: RETIRED  Tobacco Use   Smoking status: Former    Current packs/day: 0.00    Average packs/day: 2.0 packs/day for 52.5 years (105.0 ttl pk-yrs)    Types: Cigarettes    Start date: 04/14/1963    Quit date: 10/09/2015    Years since  quitting: 8.2   Smokeless tobacco: Never   Tobacco comments:    stopped for a couple of years in his 30s  Vaping Use   Vaping status: Never Used  Substance and Sexual Activity   Alcohol use: No    Alcohol/week: 0.0 standard drinks of alcohol   Drug use: No    Comment: Used to smoke marijuana   Sexual activity: Not on file  Other Topics Concern   Not on file  Social History Narrative   Lives alone/2025   2 daughters, ex-wife lives in [place]   Owns a plumbing company   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 No pets currently. Remote exposure to a parrot for 2 years.    Social Drivers of Health   Financial Resource Strain: Medium Risk (01/09/2024)   Overall Financial Resource Strain (CARDIA)    Difficulty of Paying Living Expenses: Somewhat hard  Food Insecurity: No Food Insecurity (01/09/2024)   Hunger Vital Sign    Worried About Running Out of Food in the Last Year: Never true    Ran Out of Food in the Last Year: Never true  Transportation Needs: No Transportation Needs (01/09/2024)   PRAPARE - Administrator, Civil Service (Medical): No    Lack of Transportation (Non-Medical): No  Physical Activity: Insufficiently Active (01/09/2024)   Exercise Vital Sign    Days of Exercise per Week: 4 days    Minutes of Exercise per Session: 30 min  Stress: No Stress Concern Present (01/09/2024)   [employer] of Occupational Health - Occupational Stress Questionnaire    Feeling of Stress: Not at all  Social Connections: Socially Isolated (01/09/2024)   Social Connection and Isolation Panel    Frequency of Communication with Friends and Family: More than three times a week    Frequency of Social Gatherings with Friends and Family: Once a week    Attends Religious Services: Never    Database administrator or Organizations: No     Attends Engineer, structural: Never    Marital Status: Divorced    Tobacco Counseling Counseling given: Not Answered Tobacco comments: stopped for a couple of years in his 30s    Clinical Intake:  Pre-visit preparation completed: Yes  Pain : No/denies pain     BMI - recorded: 23.22 Nutritional Status: BMI of 19-24  Normal Nutritional Risks: None Diabetes: No  Lab Results  Component Value Date   HGBA1C 5.9 (H) 09/15/2015     How often do you need to have someone help you when you read instructions, pamphlets, or other written materials from your doctor or pharmacy?: 1 - Never  Interpreter Needed?: No  Information entered by :: Tyller Bowlby, RMA   Activities of Daily Living     01/09/2024   10:48 AM  In your present state of health, do you have any difficulty performing the following activities:  Hearing? 1  Comment 80% deaf  Vision? 0  Difficulty concentrating or making decisions? 0  Walking or climbing stairs? 0  Dressing or bathing? 0  Doing errands, shopping? 0  Preparing Food and eating ? N  Using the Toilet? N  In the past six months, have you accidently leaked urine? N  Do you have problems with loss of bowel control? N  Managing your Medications? N  Managing your Finances? N  Housekeeping or managing your Housekeeping? N    Patient Care Team: Jerrell Cleatus Ned, MD as PCP - General (Internal Medicine) Bensimhon, Toribio SAUNDERS, MD as PCP - Cardiology (Cardiology) Darlean Ozell NOVAK, MD as Consulting Physician (Pulmonary Disease)  I have updated your Care Teams any recent Medical Services you may have received from other providers in the past year.     Assessment:   This is a routine wellness examination for Reginald Hahn.  Hearing/Vision screen Hearing Screening - Comments:: 80% deaf per pt Vision Screening - Comments:: Wears eyeglasses/[facility]   Goals Addressed   None    Depression Screen     01/09/2024  11:01 AM  01/07/2024    9:53 AM 10/09/2023    1:18 PM 06/11/2017    9:44 AM 10/11/2015    3:53 PM 09/30/2015    8:40 AM  PHQ 2/9 Scores  PHQ - 2 Score 0 0 0 0 0 0  PHQ- 9 Score 0 1 2       Fall Risk     01/09/2024   10:58 AM 01/07/2024    9:53 AM 06/05/2017    9:28 AM 10/11/2015    3:53 PM 09/30/2015    8:40 AM  Fall Risk   Falls in the past year? 0 0 No  No  No   Number falls in past yr: 0 0     Injury with Fall? 0 0     Risk for fall due to :  No Fall Risks     Follow up Falls evaluation completed;Falls prevention discussed Falls evaluation completed        Data saved with a previous flowsheet row definition    MEDICARE RISK AT HOME:  Medicare Risk at Home Any stairs in or around the home?: No If so, are there any without handrails?: No Home free of loose throw rugs in walkways, pet beds, electrical cords, etc?: Yes Adequate lighting in your home to reduce risk of falls?: Yes Life alert?: No Use of a cane, walker or w/c?: No Grab bars in the bathroom?: Yes Shower chair or bench in shower?: No Elevated toilet seat or a handicapped toilet?: No  TIMED UP AND GO:  Was the test performed?  No  Cognitive Function: Declined/Normal: No cognitive concerns noted by patient or family. Patient alert, oriented, able to answer questions appropriately and recall recent events. No signs of memory loss or confusion.        Immunizations Immunization History  Administered Date(s) Administered   Influenza-Unspecified 02/06/2017   Pneumococcal Polysaccharide-23 02/22/2017    Screening Tests Health Maintenance  Topic Date Due   Lung Cancer Screening  07/06/2024 (Originally 09/15/2016)   DTaP/Tdap/Td (1 - Tdap) 07/06/2024 (Originally 04/13/1969)   Pneumococcal Vaccine: 50+ Years (2 of 2 - PCV) 07/06/2024 (Originally 02/22/2018)   Colonoscopy  07/06/2024 (Originally 04/13/1996)   COVID-19 Vaccine (1 - 2024-25 season) 07/06/2024 (Originally 12/10/2023)   Medicare Annual Wellness (AWV)  07/06/2024  (Originally [birth year])   Zoster Vaccines- Shingrix (1 of 2) 07/06/2024 (Originally 04/13/2001)   Influenza Vaccine  07/08/2024 (Originally 11/09/2023)   Hepatitis C Screening  Completed   HPV VACCINES  Aged Out   Meningococcal B Vaccine  Aged Out    Health Maintenance Items Addressed: See Nurse Notes at the end of this note  Additional Screening:  Vision Screening: Recommended annual ophthalmology exams for early detection of glaucoma and other disorders of the eye. Is the patient up to date with their annual eye exam?  No  Who is the provider or what is the name of the office in which the patient attends annual eye exams? [facility]  Dental Screening: Recommended annual dental exams for proper oral hygiene  Community Resource Referral / Chronic Care Management: CRR required this visit?  No   CCM required this visit?  No   Plan:    I have personally reviewed and noted the following in the patient's chart:   Medical and social history Use of alcohol, tobacco or illicit drugs  Current medications and supplements including opioid prescriptions. Patient is not currently taking opioid prescriptions. Functional ability and status Nutritional status Physical activity Advanced  directives List of other physicians Hospitalizations, surgeries, and ER visits in previous 12 months Vitals Screenings to include cognitive, depression, and falls Referrals and appointments  In addition, I have reviewed and discussed with patient certain preventive protocols, quality metrics, and best practice recommendations. A written personalized care plan for preventive services as well as general preventive health recommendations were provided to patient.   Antion Andres L Tamila Gaulin, CMA   01/09/2024   After Visit Summary: (MyChart) Due to this being a telephonic visit, the after visit summary with patients personalized plan was offered to patient via MyChart   Notes: Patient declines all due vaccines at  this time.  He stated that he is scheduled to have surgery to remove a tumor in his bladder on 01/29/2024.  He had no other concerns to address today.

## 2024-01-16 DIAGNOSIS — D494 Neoplasm of unspecified behavior of bladder: Secondary | ICD-10-CM | POA: Diagnosis not present

## 2024-01-16 DIAGNOSIS — R310 Gross hematuria: Secondary | ICD-10-CM | POA: Diagnosis not present

## 2024-01-16 DIAGNOSIS — R3914 Feeling of incomplete bladder emptying: Secondary | ICD-10-CM | POA: Diagnosis not present

## 2024-01-21 ENCOUNTER — Telehealth (HOSPITAL_COMMUNITY): Payer: Self-pay

## 2024-01-21 NOTE — Telephone Encounter (Signed)
 Surgical clearance faxed to [facility] via [facility]

## 2024-01-21 NOTE — Telephone Encounter (Signed)
  ADVANCED HEART FAILURE CLINIC   Pre-operative Risk Assessment   HEARTCARE STAFF-IMPORTANT INSTRUCTIONS 1 Red and Blue Text will auto delete once note is signed or closed. 2 Press F2 to navigate through template.   3 On drop down lists, L click to select >> R click to activate next field 4 Reason for Visit format is IMPORTANT!!  See Directions on No. 2 below. 5 Please review chart to determine if there is already a clearance note open for this procedure!!  DO NOT duplicate if a note already exists!!    :1}      Request for Surgical Clearance    Procedure:  Transurethral Resection of Bladder  Date of Surgery:  Clearance 01/29/24                                 Surgeon:  Dr. Geofm Barter Surgeon's Group or Practice Name:  [facility] Phone number:  [phone] Fax number:  [phone]   Type of Clearance Requested:   - Medical    Type of Anesthesia:  Not Indicated   Additional requests/questions:  Please fax a copy of Clearance  to the surgeon's office.  Signed, Lisa CHRISTELLA Sergeant   01/21/2024, 9:49 AM   Advanced [facility] Harlene Gainer, FNP  [facility] [address] [facility] [place] [address] [phone] (office) [phone] (fax)

## 2024-01-22 DIAGNOSIS — R310 Gross hematuria: Secondary | ICD-10-CM | POA: Diagnosis not present

## 2024-01-22 DIAGNOSIS — D494 Neoplasm of unspecified behavior of bladder: Secondary | ICD-10-CM | POA: Diagnosis not present

## 2024-01-29 DIAGNOSIS — D494 Neoplasm of unspecified behavior of bladder: Secondary | ICD-10-CM | POA: Diagnosis not present

## 2024-01-29 DIAGNOSIS — J449 Chronic obstructive pulmonary disease, unspecified: Secondary | ICD-10-CM | POA: Diagnosis not present

## 2024-01-29 DIAGNOSIS — I10 Essential (primary) hypertension: Secondary | ICD-10-CM | POA: Diagnosis not present

## 2024-01-29 DIAGNOSIS — C674 Malignant neoplasm of posterior wall of bladder: Secondary | ICD-10-CM | POA: Diagnosis not present

## 2024-01-29 DIAGNOSIS — Z79899 Other long term (current) drug therapy: Secondary | ICD-10-CM | POA: Diagnosis not present

## 2024-01-29 DIAGNOSIS — C679 Malignant neoplasm of bladder, unspecified: Secondary | ICD-10-CM | POA: Diagnosis not present

## 2024-02-11 DIAGNOSIS — C679 Malignant neoplasm of bladder, unspecified: Secondary | ICD-10-CM | POA: Diagnosis not present

## 2024-05-14 ENCOUNTER — Other Ambulatory Visit (HOSPITAL_COMMUNITY): Payer: Self-pay | Admitting: Cardiology

## 2024-07-14 ENCOUNTER — Ambulatory Visit: Admitting: Student in an Organized Health Care Education/Training Program
